# Patient Record
Sex: Female | Born: 1945 | Race: White | Hispanic: No | Smoking: Never smoker
Health system: Southern US, Community
[De-identification: ages and names within clinical notes are randomized; demographics above are authoritative.]

## PROBLEM LIST (undated history)

## (undated) DIAGNOSIS — Z87442 Personal history of urinary calculi: Secondary | ICD-10-CM

## (undated) DIAGNOSIS — K219 Gastro-esophageal reflux disease without esophagitis: Secondary | ICD-10-CM

## (undated) DIAGNOSIS — E7800 Pure hypercholesterolemia, unspecified: Secondary | ICD-10-CM

## (undated) DIAGNOSIS — C801 Malignant (primary) neoplasm, unspecified: Secondary | ICD-10-CM

## (undated) DIAGNOSIS — M1990 Unspecified osteoarthritis, unspecified site: Secondary | ICD-10-CM

## (undated) DIAGNOSIS — Z8489 Family history of other specified conditions: Secondary | ICD-10-CM

## (undated) DIAGNOSIS — E039 Hypothyroidism, unspecified: Secondary | ICD-10-CM

## (undated) DIAGNOSIS — R011 Cardiac murmur, unspecified: Secondary | ICD-10-CM

## (undated) DIAGNOSIS — R000 Tachycardia, unspecified: Secondary | ICD-10-CM

## (undated) DIAGNOSIS — I340 Nonrheumatic mitral (valve) insufficiency: Secondary | ICD-10-CM

## (undated) DIAGNOSIS — I10 Essential (primary) hypertension: Secondary | ICD-10-CM

## (undated) HISTORY — PX: CHOLECYSTECTOMY: SHX55

## (undated) HISTORY — PX: TONSILLECTOMY: SUR1361

## (undated) HISTORY — PX: ABDOMINAL HYSTERECTOMY: SHX81

## (undated) HISTORY — PX: REPLACEMENT TOTAL KNEE BILATERAL: SUR1225

## (undated) HISTORY — PX: TUBAL LIGATION: SHX77

## (undated) HISTORY — PX: BILATERAL SALPINGOOPHORECTOMY: SHX1223

---

## 1993-12-12 HISTORY — PX: BREAST EXCISIONAL BIOPSY: SUR124

## 1999-04-26 ENCOUNTER — Encounter: Payer: Self-pay | Admitting: Orthopedic Surgery

## 1999-04-30 ENCOUNTER — Encounter: Payer: Self-pay | Admitting: Orthopedic Surgery

## 1999-04-30 ENCOUNTER — Inpatient Hospital Stay (HOSPITAL_COMMUNITY): Admission: RE | Admit: 1999-04-30 | Discharge: 1999-05-04 | Payer: Self-pay | Admitting: Orthopedic Surgery

## 1999-05-04 ENCOUNTER — Inpatient Hospital Stay (HOSPITAL_COMMUNITY)
Admission: RE | Admit: 1999-05-04 | Discharge: 1999-05-13 | Payer: Self-pay | Admitting: Physical Medicine & Rehabilitation

## 1999-07-27 ENCOUNTER — Inpatient Hospital Stay (HOSPITAL_COMMUNITY): Admission: RE | Admit: 1999-07-27 | Discharge: 1999-08-01 | Payer: Self-pay | Admitting: Orthopaedic Surgery

## 1999-10-18 ENCOUNTER — Encounter: Payer: Self-pay | Admitting: Family Medicine

## 1999-10-18 ENCOUNTER — Encounter: Admission: RE | Admit: 1999-10-18 | Discharge: 1999-10-18 | Payer: Self-pay | Admitting: Family Medicine

## 2000-10-19 ENCOUNTER — Encounter: Payer: Self-pay | Admitting: Family Medicine

## 2000-10-19 ENCOUNTER — Encounter: Admission: RE | Admit: 2000-10-19 | Discharge: 2000-10-19 | Payer: Self-pay | Admitting: *Deleted

## 2001-10-22 ENCOUNTER — Encounter: Payer: Self-pay | Admitting: Family Medicine

## 2001-10-22 ENCOUNTER — Encounter: Admission: RE | Admit: 2001-10-22 | Discharge: 2001-10-22 | Payer: Self-pay | Admitting: Family Medicine

## 2002-10-28 ENCOUNTER — Encounter: Payer: Self-pay | Admitting: Family Medicine

## 2002-10-28 ENCOUNTER — Encounter: Admission: RE | Admit: 2002-10-28 | Discharge: 2002-10-28 | Payer: Self-pay | Admitting: Family Medicine

## 2002-12-27 ENCOUNTER — Ambulatory Visit (HOSPITAL_COMMUNITY): Admission: RE | Admit: 2002-12-27 | Discharge: 2002-12-27 | Payer: Self-pay | Admitting: Family Medicine

## 2004-01-12 ENCOUNTER — Encounter: Admission: RE | Admit: 2004-01-12 | Discharge: 2004-01-12 | Payer: Self-pay | Admitting: Endocrinology

## 2004-02-10 ENCOUNTER — Other Ambulatory Visit: Admission: RE | Admit: 2004-02-10 | Discharge: 2004-02-10 | Payer: Self-pay | Admitting: Endocrinology

## 2004-04-06 ENCOUNTER — Encounter: Admission: RE | Admit: 2004-04-06 | Discharge: 2004-04-06 | Payer: Self-pay | Admitting: Family Medicine

## 2005-04-07 ENCOUNTER — Ambulatory Visit (HOSPITAL_COMMUNITY): Admission: RE | Admit: 2005-04-07 | Discharge: 2005-04-07 | Payer: Self-pay | Admitting: Family Medicine

## 2005-04-27 ENCOUNTER — Encounter: Admission: RE | Admit: 2005-04-27 | Discharge: 2005-04-27 | Payer: Self-pay | Admitting: Family Medicine

## 2005-12-12 HISTORY — PX: ROUX-EN-Y PROCEDURE: SUR1287

## 2006-08-15 ENCOUNTER — Ambulatory Visit (HOSPITAL_COMMUNITY): Admission: RE | Admit: 2006-08-15 | Discharge: 2006-08-15 | Payer: Self-pay

## 2006-10-04 ENCOUNTER — Encounter (INDEPENDENT_AMBULATORY_CARE_PROVIDER_SITE_OTHER): Payer: Self-pay | Admitting: *Deleted

## 2006-10-04 ENCOUNTER — Ambulatory Visit: Payer: Self-pay | Admitting: Internal Medicine

## 2006-10-04 ENCOUNTER — Ambulatory Visit (HOSPITAL_COMMUNITY): Admission: RE | Admit: 2006-10-04 | Discharge: 2006-10-04 | Payer: Self-pay | Admitting: Internal Medicine

## 2006-10-31 ENCOUNTER — Encounter: Admission: RE | Admit: 2006-10-31 | Discharge: 2006-10-31 | Payer: Self-pay | Admitting: Family Medicine

## 2008-01-14 ENCOUNTER — Encounter: Admission: RE | Admit: 2008-01-14 | Discharge: 2008-01-14 | Payer: Self-pay | Admitting: Family Medicine

## 2008-02-15 ENCOUNTER — Ambulatory Visit (HOSPITAL_COMMUNITY): Admission: RE | Admit: 2008-02-15 | Discharge: 2008-02-15 | Payer: Self-pay | Admitting: Family Medicine

## 2009-03-11 ENCOUNTER — Encounter: Admission: RE | Admit: 2009-03-11 | Discharge: 2009-03-11 | Payer: Self-pay | Admitting: Family Medicine

## 2009-03-16 ENCOUNTER — Encounter: Admission: RE | Admit: 2009-03-16 | Discharge: 2009-03-16 | Payer: Self-pay | Admitting: Family Medicine

## 2010-04-06 ENCOUNTER — Emergency Department (HOSPITAL_COMMUNITY): Admission: EM | Admit: 2010-04-06 | Discharge: 2010-04-07 | Payer: Self-pay | Admitting: Emergency Medicine

## 2010-04-08 ENCOUNTER — Ambulatory Visit (HOSPITAL_COMMUNITY): Admission: RE | Admit: 2010-04-08 | Discharge: 2010-04-08 | Payer: Self-pay | Admitting: Urology

## 2010-08-12 ENCOUNTER — Encounter: Admission: RE | Admit: 2010-08-12 | Discharge: 2010-08-12 | Payer: Self-pay | Admitting: Family Medicine

## 2010-11-02 ENCOUNTER — Encounter: Admission: RE | Admit: 2010-11-02 | Discharge: 2010-11-02 | Payer: Self-pay | Admitting: Surgery

## 2010-12-12 HISTORY — PX: TOTAL THYROIDECTOMY: SHX2547

## 2010-12-27 LAB — URINALYSIS, ROUTINE W REFLEX MICROSCOPIC
Bilirubin Urine: NEGATIVE
Hgb urine dipstick: NEGATIVE
Ketones, ur: NEGATIVE mg/dL
Nitrite: NEGATIVE
Protein, ur: NEGATIVE mg/dL
Specific Gravity, Urine: 1.018 (ref 1.005–1.030)
Urine Glucose, Fasting: NEGATIVE mg/dL
Urobilinogen, UA: 0.2 mg/dL (ref 0.0–1.0)
pH: 5.5 (ref 5.0–8.0)

## 2010-12-27 LAB — BASIC METABOLIC PANEL
BUN: 12 mg/dL (ref 6–23)
CO2: 28 mEq/L (ref 19–32)
Calcium: 8.8 mg/dL (ref 8.4–10.5)
Chloride: 108 mEq/L (ref 96–112)
Creatinine, Ser: 0.73 mg/dL (ref 0.4–1.2)
GFR calc Af Amer: >60 mL/min (ref >60)
GFR calc non Af Amer: >60 mL/min (ref >60)
Glucose, Bld: 84 mg/dL (ref 70–99)
Potassium: 3.9 mEq/L (ref 3.5–5.1)
Sodium: 143 mEq/L (ref 135–145)

## 2010-12-27 LAB — SURGICAL PCR SCREEN
MRSA, PCR: NEGATIVE
Staphylococcus aureus: POSITIVE — AB

## 2010-12-27 LAB — CBC
HCT: 38 % (ref 36.0–46.0)
Hemoglobin: 12.4 g/dL (ref 12.0–15.0)
MCH: 29.3 pg (ref 26.0–34.0)
MCHC: 32.6 g/dL (ref 30.0–36.0)
MCV: 89.8 fL (ref 78.0–100.0)
Platelets: 228 10*3/uL (ref 150–400)
RBC: 4.23 MIL/uL (ref 3.87–5.11)
RDW: 13.5 % (ref 11.5–15.5)
WBC: 4.9 10*3/uL (ref 4.0–10.5)

## 2010-12-27 LAB — DIFFERENTIAL
Basophils Absolute: 0 10*3/uL (ref 0.0–0.1)
Basophils Relative: 0 % (ref 0–1)
Eosinophils Absolute: 0.1 10*3/uL (ref 0.0–0.7)
Eosinophils Relative: 3 % (ref 0–5)
Lymphocytes Relative: 39 % (ref 12–46)
Lymphs Abs: 1.9 10*3/uL (ref 0.7–4.0)
Monocytes Absolute: 0.3 10*3/uL (ref 0.1–1.0)
Monocytes Relative: 6 % (ref 3–12)
Neutro Abs: 2.5 10*3/uL (ref 1.7–7.7)
Neutrophils Relative %: 52 % (ref 43–77)

## 2010-12-27 LAB — PROTIME-INR
INR: 0.92 (ref 0.00–1.49)
Prothrombin Time: 12.6 seconds (ref 11.6–15.2)

## 2010-12-31 ENCOUNTER — Ambulatory Visit (HOSPITAL_COMMUNITY)
Admission: RE | Admit: 2010-12-31 | Discharge: 2011-01-01 | Payer: Self-pay | Source: Home / Self Care | Attending: Surgery | Admitting: Surgery

## 2010-12-31 ENCOUNTER — Encounter (INDEPENDENT_AMBULATORY_CARE_PROVIDER_SITE_OTHER): Payer: Self-pay | Admitting: Surgery

## 2011-01-02 ENCOUNTER — Encounter: Payer: Self-pay | Admitting: Family Medicine

## 2011-01-02 NOTE — Discharge Summary (Signed)
  NAMEDEL, WISEMAN NO.:  [id number]  MEDICAL RECORD NO.:  [id number]          PATIENT TYPE:  OIB  LOCATION:  1524                         FACILITY:  [facility]  PHYSICIAN:  Velora Heckler, MD      DATE OF BIRTH:  [birth year]  DATE OF ADMISSION:  12/31/2010 DATE OF DISCHARGE:  01/01/2011                              DISCHARGE SUMMARY   REASON FOR ADMISSION:  Thyroid goiter.  BRIEF HISTORY:  [name] is a 65 year old white female from [place], [place].  The patient was evaluated by Dr. Casimiro Needle Altheimer, due to enlarging thyroid goiter.  The patient has had previous treatment with radioactive iodine.  She previously took thyroid hormone supplementation.  The patient now comes to surgery for thyroidectomy for enlarging thyroid goiter with tracheal deviation.  HOSPITAL COURSE:  The patient was admitted on January 20 and taken directly to the operating room.  She underwent total thyroidectomy. Postoperative course was uncomplicated.  Calcium levels remained stable at 8.7.  The patient had good pain control.  She tolerated a diet.  She was prepared for discharge home on the first postoperative day.  DISCHARGE PLANNING:  The patient is discharged home on January 01, 2011 from [facility].  Prescriptions were given for Vicodin to take as needed for pain and Synthroid 100 mcg daily.  The patient will be seen back in my office in 2 to 3 weeks for wound check and a calcium level.  We will arrange for followup with Dr. Casimiro Needle Altheimer for adjustment of her thyroid hormone replacement dosage.  DISCHARGE DIAGNOSES:  Thyroid goiter, final pathologic results pending at the time of discharge.  CONDITION AT DISCHARGE:  Good.     Velora Heckler, MD     TMG/MEDQ  D:  01/01/2011  T:  01/01/2011  Job:  [id number]  cc:   Veverly Fells. Altheimer, M.D. Fax: [phone]  Donna Bernard, M.D. Fax: [phone]  Electronically Signed by Darnell Level MD on 01/02/2011 10:05:16 AM

## 2011-01-02 NOTE — Op Note (Signed)
[name], [name] NO.:  [id number]  MEDICAL RECORD NO.:  [id number]          PATIENT TYPE:  AMB  LOCATION:  DAY                          FACILITY:  [facility]  PHYSICIAN:  Hailey Heckler, MD      DATE OF BIRTH:  22-Apr-1946  DATE OF PROCEDURE:  12/31/2010 DATE OF DISCHARGE:                              OPERATIVE REPORT   PREOPERATIVE DIAGNOSIS:  Thyroid goiter with tracheal deviation.  POSTOPERATIVE DIAGNOSIS:  Thyroid goiter with tracheal deviation.  PROCEDURE:  Total thyroidectomy.  SURGEON:  Hailey Heckler, MD  ANESTHESIA:  General, per [name]. Fortune, MD  ESTIMATED BLOOD LOSS:  150 mL.  PREPARATION:  ChloraPrep.  COMPLICATIONS:  None.  INDICATIONS FOR PROCEDURE:  The patient is a 65 year old white female from [place], [place].  She was referred by Dr. Casimiro Needle [name] for thyroidectomy for management of thyroid goiter.  The patient has had a known goiter for several years.  Ultrasound in 2005 demonstrated bilateral thyroid nodules.  The patient has a distant history of radioactive iodine treatment for hyperthyroidism.  The patient is not currently taking thyroid hormone replacement.  She now comes to surgery for thyroidectomy due to a large asymmetrical thyroid goiter with tracheal deviation.  PROCEDURE IN DETAIL:  The procedure was done in OR #11 at the [facility].  The patient was brought to the operating room, placed in supine position on the operating room table.  Following administration of general anesthesia, the patient was positioned and then prepped and draped in the usual strict aseptic fashion.  After ascertaining that an adequate level of anesthesia had been achieved, a Kocher incision was made on the anterior neck with a #15 blade. Dissection was carried through subcutaneous tissues and platysma. Hemostasis was obtained with electrocautery.  Skin flaps were elevated cephalad and caudad from the  thyroid notch to the sternal notch.  A Mahorner self-retaining retractor was placed for exposure.  Strap muscles were incised in what was presumably the midline.  There was marked distortion of the tissues due to the asymmetric goiter and large size of the right thyroid lobe.  Incision was extended in the midline of the strap muscles cephalad and caudad.  The plane around the right thyroid lobe was then developed with gentle blunt dissection.  Venous tributaries were divided between Ligaclips with the harmonic scalpel. Right lobe was gently mobilized with gentle blunt dissection and delivered anteriorly.  Superior pole vessels were divided between medium Ligaclips with the harmonic scalpel.  The right lobe has a dominant mass occupying essentially the upper 90% of the lobe.  This was firm, large solid in appearance.  The inferior portion of the right lobe is multicystic and somewhat violaceous in color with a large venous collaterals involved.  With some difficulty, the lobe was mobilized away from the structures in the right neck. Vascular tributaries were divided between Ligaclips with the harmonic scalpel.  Dissection was carried down to the presumed area of the recurrent nerve.  The recurrent nerve was not dissected out due to the distortion of the tissues and fear of injury.  However, at no point  did the nerve become visualized or was the nerve transected, at least not under recognition of that structure.  Inferior parathyroid gland was identified and preserved on its vascular pedicle.  Dissection was carried down to the airway and the presumed ligament of Allyson Sabal was transected and the gland was mobilized up and onto the anterior trachea. The thyroid isthmus was essentially nonexistent and was divided easily with the harmonic scalpel.  The entire right thyroid lobe was submitted to pathology as an individual specimen for evaluation.  Dry pack was placed in the right neck.  Next, we  turned our attention to the left thyroid lobe.  Strap muscles were reflected laterally exposing the left lobe.  The left lobe is much smaller but is essentially a small version of the right.  Again, there was a dominant mass occupying the upper portion of the lobe.  The tissue was of the same consistency and appearance as the right side only on a smaller scale.  There was some inflammatory component.  With some difficulty, the surrounding tissues were stripped off the capsule of the thyroid.  Superior pole vessels were divided between Ligaclips with the harmonic scalpel.  Inferior venous tributaries were divided between Ligaclips with the harmonic scalpel.  Gland is rolled anteriorly.  On the left side, the recurrent nerve was positively identified and preserved.  Branches of the inferior thyroid artery are divided between small Ligaclips with the harmonic scalpel.  Ligament of Allyson Sabal was released with the electrocautery and the lobe was mobilized onto the anterior trachea and completely excised.  It was submitted in its entirety as the left thyroid lobe to pathology for review.  Neck was irrigated copiously with warm saline.  Good hemostasis was achieved throughout.  Surgicel was placed in the operative field.  Strap muscles were reapproximated in the midline with interrupted 3-0 Vicryl sutures.  Platysma was closed with interrupted 3-0 Vicryl sutures.  Skin was closed with running 4-0 Monocryl subcuticular suture.  Wound was washed and dried and Benzoin and Steri-Strips were applied.  Sterile dressings were applied.  The patient was awakened from anesthesia and brought to the recovery room.  The patient tolerated the procedure well.     Hailey Heckler, MD     TMG/MEDQ  D:  12/31/2010  T:  12/31/2010  Job:  [id number]  cc:   Hailey Fells. [name], M.D. Fax: [phone]  [name], M.D. Fax: [phone]  Electronically Signed by Darnell Level MD on 01/02/2011 10:05:12 AM

## 2011-01-03 LAB — CALCIUM: Calcium: 8.7 mg/dL (ref 8.4–10.5)

## 2011-01-04 LAB — CALCIUM: Calcium: 8.7 mg/dL (ref 8.4–10.5)

## 2011-01-31 ENCOUNTER — Other Ambulatory Visit (HOSPITAL_COMMUNITY): Payer: Self-pay | Admitting: Endocrinology

## 2011-01-31 DIAGNOSIS — C73 Malignant neoplasm of thyroid gland: Secondary | ICD-10-CM

## 2011-02-09 ENCOUNTER — Ambulatory Visit (HOSPITAL_COMMUNITY)
Admission: RE | Admit: 2011-02-09 | Discharge: 2011-02-09 | Disposition: A | Discharge status: Home / Self Care | Payer: BC Managed Care – PPO | Source: Ambulatory Visit | Attending: Endocrinology | Admitting: Endocrinology

## 2011-02-09 DIAGNOSIS — Z09 Encounter for follow-up examination after completed treatment for conditions other than malignant neoplasm: Secondary | ICD-10-CM | POA: Insufficient documentation

## 2011-02-09 DIAGNOSIS — C73 Malignant neoplasm of thyroid gland: Secondary | ICD-10-CM | POA: Insufficient documentation

## 2011-02-11 MED ORDER — SODIUM IODIDE I 131 CAPSULE
109.0000 | Freq: Once | INTRAVENOUS | Status: AC | PRN
  Administered 2011-02-11: 109 via ORAL

## 2011-02-21 ENCOUNTER — Ambulatory Visit (HOSPITAL_COMMUNITY)
Admission: RE | Admit: 2011-02-21 | Discharge: 2011-02-21 | Disposition: A | Discharge status: Home / Self Care | Payer: Medicare Other | Source: Ambulatory Visit | Attending: Endocrinology | Admitting: Endocrinology

## 2011-02-21 DIAGNOSIS — C73 Malignant neoplasm of thyroid gland: Secondary | ICD-10-CM | POA: Insufficient documentation

## 2011-02-21 DIAGNOSIS — E0789 Other specified disorders of thyroid: Secondary | ICD-10-CM | POA: Insufficient documentation

## 2011-03-01 LAB — DIFFERENTIAL
Basophils Relative: 0 % (ref 0–1)
Eosinophils Absolute: 0 10*3/uL (ref 0.0–0.7)
Eosinophils Relative: 0 % (ref 0–5)
Monocytes Absolute: 0.6 10*3/uL (ref 0.1–1.0)
Monocytes Relative: 6 % (ref 3–12)
Neutrophils Relative %: 78 % — ABNORMAL HIGH (ref 43–77)

## 2011-03-01 LAB — HEPATIC FUNCTION PANEL
Alkaline Phosphatase: 53 U/L (ref 39–117)
Bilirubin, Direct: 0.2 mg/dL (ref 0.0–0.3)
Indirect Bilirubin: 0.6 mg/dL (ref 0.3–0.9)
Total Bilirubin: 0.8 mg/dL (ref 0.3–1.2)

## 2011-03-01 LAB — BASIC METABOLIC PANEL
Chloride: 105 mEq/L (ref 96–112)
Creatinine, Ser: 0.94 mg/dL (ref 0.4–1.2)
GFR calc Af Amer: >60 mL/min (ref >60)
Potassium: 3.4 mEq/L — ABNORMAL LOW (ref 3.5–5.1)

## 2011-03-01 LAB — CBC
MCV: 88.9 fL (ref 78.0–100.0)
RBC: 4.47 MIL/uL (ref 3.87–5.11)
WBC: 9.6 10*3/uL (ref 4.0–10.5)

## 2011-03-01 LAB — LIPASE, BLOOD: Lipase: 18 U/L (ref 11–59)

## 2011-04-29 NOTE — Op Note (Signed)
NAMEABBEY, VEITH             ACCOUNT NO.:  [id number]   MEDICAL RECORD NO.:  [id number]          PATIENT TYPE:  AMB   LOCATION:  DAY                           FACILITY:  [facility]   PHYSICIAN:  Lionel December, M.D.    DATE OF BIRTH:  1945-12-22   DATE OF PROCEDURE:  10/04/2006  DATE OF DISCHARGE:                                 OPERATIVE REPORT   PROCEDURE:  Esophagogastroduodenoscopy.   INDICATION:  [name] is a 65 year old Caucasian female who is being  evaluated for bariatric surgery.  She is scheduled to undergo Roux-en-Y  procedure for obesity next month.  She had preop upper GI series which shows  either thickened fold or polyps.  She is therefore undergoing diagnostic  EGD.  The patient has chronic GERD and his symptoms well-controlled with  PPI.  Her last EGD was in 1999 when she had small sliding hiatal hernia.  Procedure risks were reviewed with the patient and informed consent was  obtained.   MEDICATIONS FOR CONSCIOUS SEDATION:  Benzocaine spray for pharyngeal topical  anesthesia, Demerol 50 mg IV and Versed 10 mg IV.   FINDINGS:  Procedure performed in endoscopy suite.  The patient's vital  signs and O2 sat were monitored during procedure and remained stable.  The  patient was placed left lateral recumbent position and Olympus videoscope  was passed per oropharynx without any difficulty into esophagus.   Esophagus:  Mucosa of the esophagus was normal.  GE junction was at 38 cm  from incisors and hiatus was at 40.  She had small sliding hiatal hernia.   Stomach:  It was empty and distended very well with insufflation.  Folds in  proximal stomach were normal.  Examination of mucosa revealed multiple small  polyps at gastric body.  The largest revealed multiple polyps.  Most of  which were small but three of these was 10-12 mm.  Endoscopically they  appeared to be hyperplastic.  Antral mucosa was normal.  Pyloric channel was  patent.  Angularis, fundus and cardia were  examined by retroflexing the  scope and were normal.   Duodenum:  Bulbar mucosa was normal.  Scope was passed in the second part of  the duodenum where mucosa and folds were normal.   Endoscope was withdrawn.  At least seven of these polyps were biopsied for  histology.  All the samples were submitted in one container.  Endoscope was  withdrawn.  The patient tolerated the procedure well.   FINAL DIAGNOSES:  1. Small sliding hiatal hernia.  No evidence of esophagitis.  2. Multiple gastric polyps at body with appearance typical of hyperplastic      polyps.  Multiple of these were biopsied for histologic confirmation.   No evidence of peptic ulcer disease.   RECOMMENDATIONS:  She will continue her entire reflux measures, PPI as  before.  H. pylori serology will be checked today.  I will be contacting  patient with the results of biopsy and blood test results.   She will keep her appointment with Dr. Oneita Hurt.      Lionel December, M.D.  Electronically Signed  NR/MEDQ  D:  10/04/2006  T:  10/05/2006  Job:  [id number]   cc:   Donna Bernard, M.D.  Fax: [phone]   Oneita Hurt

## 2011-08-24 ENCOUNTER — Other Ambulatory Visit: Payer: Self-pay | Admitting: Family Medicine

## 2011-08-24 DIAGNOSIS — Z1231 Encounter for screening mammogram for malignant neoplasm of breast: Secondary | ICD-10-CM

## 2011-09-02 ENCOUNTER — Ambulatory Visit
Admission: RE | Admit: 2011-09-02 | Discharge: 2011-09-02 | Disposition: A | Discharge status: Home / Self Care | Payer: BC Managed Care – PPO | Source: Ambulatory Visit | Attending: Family Medicine | Admitting: Family Medicine

## 2011-09-02 DIAGNOSIS — Z1231 Encounter for screening mammogram for malignant neoplasm of breast: Secondary | ICD-10-CM

## 2011-10-25 ENCOUNTER — Telehealth: Payer: Self-pay

## 2011-10-25 ENCOUNTER — Other Ambulatory Visit: Payer: Self-pay

## 2011-10-25 DIAGNOSIS — Z139 Encounter for screening, unspecified: Secondary | ICD-10-CM

## 2011-10-25 NOTE — Telephone Encounter (Signed)
OK to proceed with colonoscopy.

## 2011-10-25 NOTE — Telephone Encounter (Signed)
Gastroenterology Pre-Procedure Form   TRIAGED BY Amanda Cockayne  CMA  Request Date: 10/24/2011       Requesting Physician: Dr. Simone Curia     PATIENT INFORMATION:  [name] is a 65 y.o., female (DOB=1946-02-01).  PROCEDURE: Procedure(s) requested: colonoscopy Procedure Reason: screening for colon cancer  PATIENT REVIEW QUESTIONS: The patient reports the following:   1. Diabetes Melitis: no 2. Joint replacements in the past 12 months: no 3. Major health problems in the past 3 months: no 4. Has an artificial valve or MVP:no 5. Has been advised in past to take antibiotics in advance of a procedure like teeth cleaning: no}    MEDICATIONS & ALLERGIES:    Patient reports the following regarding taking any blood thinners:   Plavix? no Aspirin?yes  Coumadin?  no  Patient confirms/reports the following medications:  Current Outpatient Prescriptions  Medication Sig Dispense Refill  . aspirin 81 MG tablet Take 81 mg by mouth daily.        . Multiple Vitamin (MULTIVITAMIN) tablet Take 1 tablet by mouth daily.        . potassium chloride SA (K-DUR,KLOR-CON) 20 MEQ tablet Take 20 mEq by mouth 2 (two) times daily.        . simvastatin (ZOCOR) 20 MG tablet Take 20 mg by mouth at bedtime.          Patient confirms/reports the following allergies:  Allergies  Allergen Reactions  . Codeine Nausea And Vomiting  . Demerol Nausea And Vomiting    Patient is appropriate to schedule for requested procedure(s): yes  AUTHORIZATION INFORMATION Primary Insurance:   ID #:  Group #:  Pre-Cert / Auth required:} Pre-Cert / Auth #:   Secondary Insurance:   ID #:   Group #:  Pre-Cert / Auth required:  Pre-Cert / Auth #:   No orders of the defined types were placed in this encounter.    SCHEDULE INFORMATION: Procedure has been scheduled as follows:  Date: 11/14/2011     Time: 1:15 PM  Location: [facility] Short Stay  This Gastroenterology Pre-Precedure Form is being  routed to the following provider(s) for review: R. Roetta Sessions, MD

## 2011-10-26 NOTE — Telephone Encounter (Signed)
Rx and instructions mailed to pt.  

## 2011-11-08 ENCOUNTER — Encounter (HOSPITAL_COMMUNITY): Payer: Self-pay | Admitting: Pharmacy Technician

## 2011-11-11 MED ORDER — SODIUM CHLORIDE 0.45 % IV SOLN: Freq: Once | INTRAVENOUS | Status: DC

## 2011-11-14 ENCOUNTER — Encounter (HOSPITAL_COMMUNITY)
Admission: RE | Disposition: A | Discharge status: Home / Self Care | Payer: Self-pay | Source: Ambulatory Visit | Attending: Internal Medicine

## 2011-11-14 ENCOUNTER — Ambulatory Visit (HOSPITAL_COMMUNITY)
Admission: RE | Admit: 2011-11-14 | Discharge: 2011-11-14 | Disposition: A | Discharge status: Home / Self Care | Payer: BC Managed Care – PPO | Source: Ambulatory Visit | Attending: Internal Medicine | Admitting: Internal Medicine

## 2011-11-14 ENCOUNTER — Encounter (HOSPITAL_COMMUNITY): Payer: Self-pay | Admitting: *Deleted

## 2011-11-14 DIAGNOSIS — E7800 Pure hypercholesterolemia, unspecified: Secondary | ICD-10-CM | POA: Insufficient documentation

## 2011-11-14 DIAGNOSIS — Z1211 Encounter for screening for malignant neoplasm of colon: Secondary | ICD-10-CM

## 2011-11-14 DIAGNOSIS — K5730 Diverticulosis of large intestine without perforation or abscess without bleeding: Secondary | ICD-10-CM | POA: Insufficient documentation

## 2011-11-14 DIAGNOSIS — Z139 Encounter for screening, unspecified: Secondary | ICD-10-CM

## 2011-11-14 DIAGNOSIS — Z7982 Long term (current) use of aspirin: Secondary | ICD-10-CM | POA: Insufficient documentation

## 2011-11-14 HISTORY — DX: Pure hypercholesterolemia, unspecified: E78.00

## 2011-11-14 HISTORY — PX: COLONOSCOPY: SHX5424

## 2011-11-14 HISTORY — DX: Hypothyroidism, unspecified: E03.9

## 2011-11-14 HISTORY — DX: Malignant (primary) neoplasm, unspecified: C80.1

## 2011-11-14 SURGERY — COLONOSCOPY
Anesthesia: Moderate Sedation

## 2011-11-14 MED ORDER — MIDAZOLAM HCL 5 MG/5ML IJ SOLN
INTRAMUSCULAR | Status: DC | PRN
  Administered 2011-11-14: 2 mg via INTRAVENOUS
  Administered 2011-11-14: 1 mg via INTRAVENOUS
  Administered 2011-11-14: 2 mg via INTRAVENOUS

## 2011-11-14 MED ORDER — STERILE WATER FOR IRRIGATION IR SOLN
Status: DC | PRN
  Administered 2011-11-14: 13:00:00

## 2011-11-14 MED ORDER — FENTANYL CITRATE 0.05 MG/ML IJ SOLN
INTRAMUSCULAR | Status: AC
  Filled 2011-11-14: qty 4

## 2011-11-14 MED ORDER — FENTANYL CITRATE 0.05 MG/ML IJ SOLN
INTRAMUSCULAR | Status: DC | PRN
  Administered 2011-11-14: 50 ug via INTRAVENOUS
  Administered 2011-11-14: 25 ug via INTRAVENOUS

## 2011-11-14 MED ORDER — MEPERIDINE HCL 100 MG/ML IJ SOLN: INTRAMUSCULAR | Status: DC | PRN

## 2011-11-14 MED ORDER — MIDAZOLAM HCL 5 MG/5ML IJ SOLN
INTRAMUSCULAR | Status: AC
  Filled 2011-11-14: qty 10

## 2011-11-14 NOTE — H&P (Signed)
Primary Care Physician:  Harlow Asa, MD, MD Primary Gastroenterologist:  Dr.   Pre-Procedure History & Physical: HPI:  [name] is a 65 y.o. female is here for a screening colonoscopy. May never have had a prior colonoscopy. Did have a sigmoidoscopy previously. No GI symptoms currently. No family history of polyps or colon cancer  Past Medical History  Diagnosis Date  . Hypercholesteremia   . Hypothyroidism   . Cancer     Thyroid cancer    Past Surgical History  Procedure Date  . Total thyroidectomy 12/2010  . Abdominal hysterectomy   . Roux-en-y procedure 2007  . Cholecystectomy   . Bilateral salpingoophorectomy   . Tubal ligation   . Tonsillectomy   . Replacement total knee bilateral     Prior to Admission medications   Medication Sig Start Date End Date Taking? Authorizing Provider  aspirin EC 81 MG tablet Take 81 mg by mouth every evening.      Historical Provider, MD  calcium citrate (CALCITRATE - DOSED IN MG ELEMENTAL CALCIUM) 950 MG tablet Take 2 tablets by mouth 2 (two) times daily.      Historical Provider, MD  Cholecalciferol (VITAMIN D-3) 5000 UNITS TABS Take 1 tablet by mouth every morning.     Historical Provider, MD  levothyroxine (SYNTHROID, LEVOTHROID) 175 MCG tablet Take 175 mcg by mouth every morning.      Historical Provider, MD  Multiple Vitamins-Minerals (MULTIVITAMINS THER. W/MINERALS) TABS Take 1 tablet by mouth 2 (two) times daily.      Historical Provider, MD  potassium chloride SA (K-DUR,KLOR-CON) 20 MEQ tablet Take 20 mEq by mouth 2 (two) times daily.      Historical Provider, MD  simvastatin (ZOCOR) 20 MG tablet Take 20 mg by mouth at bedtime.     Historical Provider, MD  vitamin B-12 (CYANOCOBALAMIN) 500 MCG tablet Take 500 mcg by mouth every morning.      Historical Provider, MD    Allergies as of 10/25/2011 - Review Complete 09/02/2011  Allergen Reaction Noted  . Codeine Nausea And Vomiting 10/25/2011  . Demerol Nausea And Vomiting  10/25/2011    Family History  Problem Relation Age of Onset  . Colon cancer Neg Hx     History   Social History  . Marital Status: Widowed    Spouse Name: N/A    Number of Children: N/A  . Years of Education: N/A   Occupational History  . Not on file.   Social History Main Topics  . Smoking status: Never Smoker   . Smokeless tobacco: Not on file  . Alcohol Use: No  . Drug Use: No  . Sexually Active:    Other Topics Concern  . Not on file   Social History Narrative  . No narrative on file    Review of Systems: See HPI, otherwise negative ROS  Physical Exam: BP 156/80  Pulse 69  Temp(Src) 98.2 F (36.8 C) (Oral)  Resp 14  Ht 5' 7.5" (1.715 m)  Wt 187 lb (84.823 kg)  BMI 28.86 kg/m2  SpO2 97% General:   Alert,  Well-developed, well-nourished, pleasant and cooperative in NAD Head:  Normocephalic and atraumatic. Eyes:  Sclera clear, no icterus.   Conjunctiva pink. Lungs:  Clear throughout to auscultation.   No wheezes, crackles, or rhonchi. No acute distress. Heart:  Regular rate and rhythm; no murmurs, clicks, rubs,  or gallops. Abdomen:  Nondistended positive bowel sounds soft and nontender without appreciable mass or organomegaly  Pulses:  Normal pulses noted. Extremities:  Without clubbing or edema. Neurologic:  Alert and  oriented x4;  grossly normal neurologically. Skin:  Intact without significant lesions or rashes. Cervical Nodes:  No significant cervical adenopathy. Psych:  Alert and cooperative. Normal mood and affect.  Impression/Plan: [name] is now here to undergo a screening colonoscopy. Average risk screening exam. No GI symptoms and no family history.  The risks, benefits, limitations, imponderables and alternatives regarding colonoscopy have been reviewed with the patient. Questions have been answered. All parties agreeable.

## 2011-11-23 ENCOUNTER — Encounter (HOSPITAL_COMMUNITY): Payer: Self-pay | Admitting: Internal Medicine

## 2012-08-21 ENCOUNTER — Other Ambulatory Visit: Payer: Self-pay | Admitting: Family Medicine

## 2012-08-21 DIAGNOSIS — Z1231 Encounter for screening mammogram for malignant neoplasm of breast: Secondary | ICD-10-CM

## 2012-09-04 ENCOUNTER — Ambulatory Visit: Payer: BC Managed Care – PPO

## 2012-10-23 ENCOUNTER — Ambulatory Visit
Admission: RE | Admit: 2012-10-23 | Discharge: 2012-10-23 | Disposition: A | Discharge status: Home / Self Care | Payer: Medicare Other | Source: Ambulatory Visit | Attending: Family Medicine | Admitting: Family Medicine

## 2012-10-23 DIAGNOSIS — Z1231 Encounter for screening mammogram for malignant neoplasm of breast: Secondary | ICD-10-CM

## 2013-09-06 ENCOUNTER — Encounter: Payer: Self-pay | Admitting: Family Medicine

## 2013-09-06 ENCOUNTER — Ambulatory Visit (INDEPENDENT_AMBULATORY_CARE_PROVIDER_SITE_OTHER): Payer: Medicare Other | Admitting: Family Medicine

## 2013-09-06 VITALS — BP 164/100 | Ht 67.0 in | Wt 180.2 lb

## 2013-09-06 DIAGNOSIS — E785 Hyperlipidemia, unspecified: Secondary | ICD-10-CM

## 2013-09-06 DIAGNOSIS — K219 Gastro-esophageal reflux disease without esophagitis: Secondary | ICD-10-CM

## 2013-09-06 DIAGNOSIS — I10 Essential (primary) hypertension: Secondary | ICD-10-CM

## 2013-09-06 DIAGNOSIS — E7800 Pure hypercholesterolemia, unspecified: Secondary | ICD-10-CM

## 2013-09-06 DIAGNOSIS — Z79899 Other long term (current) drug therapy: Secondary | ICD-10-CM

## 2013-09-06 MED ORDER — ENALAPRIL MALEATE 5 MG PO TABS: 5.0000 mg | ORAL_TABLET | Freq: Every day | ORAL | Status: DC

## 2013-09-06 NOTE — Progress Notes (Signed)
  Subjective:    Patient ID: [name], female    DOB: June 27, 1946, 67 y.o.   MRN: [id number]  HPI Patient is here today b/c she has BP concerns. She states they have been in the 160's over 90's. She noticed the high BP's 2 weeks ago when [employer] took her BP. Pt does have headaches in the morning, but they resolve quickly when she takes Tylenol. Pt denies blurry vision.   Pt also has a rash on her left arm. She believes it is poison oak b/c she was cutting it back 2 weeks ago.   Patient also needs screening for of numerous concerns. Due to gastric bypass has been told she needs annual assessment of iron and calcium metabolism.  Patient states mostly watch his diet trying to do the right thing.   elevated lately.  Headache in am, not partic high. Diffuse in nature.    Review of Systems No chest pain no back pain no abdominal pain ROS otherwise negative    Objective:   Physical Exam  Alert no acute distress. HEENT normal. Lungs clear. Heart regular in rhythm. Abdomen benign. Ankles without edema.  Blood pressure repeat 144/96      Assessment & Plan:  Impression hypertension discussed time to resume medicine. #2 hyperlipidemia and discuss. #3 hypothyroidism discussed fall by specialist. #4 status post gastric bypass. Plan appropriate blood work. Initiate enalapril 5 daily recheck in several months. WSL

## 2013-09-09 DIAGNOSIS — I10 Essential (primary) hypertension: Secondary | ICD-10-CM | POA: Insufficient documentation

## 2013-09-09 DIAGNOSIS — E785 Hyperlipidemia, unspecified: Secondary | ICD-10-CM | POA: Insufficient documentation

## 2013-09-09 DIAGNOSIS — K219 Gastro-esophageal reflux disease without esophagitis: Secondary | ICD-10-CM | POA: Insufficient documentation

## 2013-09-11 LAB — VITAMIN D 25 HYDROXY (VIT D DEFICIENCY, FRACTURES): Vit D, 25-Hydroxy: 46 ng/mL (ref 30–89)

## 2013-09-11 LAB — LIPID PANEL
Cholesterol: 180 mg/dL (ref 0–200)
HDL: 63 mg/dL (ref >39)
Total CHOL/HDL Ratio: 2.9 Ratio

## 2013-09-11 LAB — HEPATIC FUNCTION PANEL
ALT: 17 U/L (ref 0–35)
AST: 15 U/L (ref 0–37)
Alkaline Phosphatase: 58 U/L (ref 39–117)
Bilirubin, Direct: 0.1 mg/dL (ref 0.0–0.3)

## 2013-09-11 LAB — BASIC METABOLIC PANEL
CO2: 29 mEq/L (ref 19–32)
Calcium: 8.5 mg/dL (ref 8.4–10.5)
Creat: 0.73 mg/dL (ref 0.50–1.10)
Glucose, Bld: 86 mg/dL (ref 70–99)
Sodium: 142 mEq/L (ref 135–145)

## 2013-09-11 LAB — FOLATE: Folate: >20 ng/mL

## 2013-10-17 ENCOUNTER — Other Ambulatory Visit: Payer: Self-pay

## 2013-12-09 ENCOUNTER — Encounter: Payer: Self-pay | Admitting: Family Medicine

## 2013-12-09 ENCOUNTER — Ambulatory Visit (INDEPENDENT_AMBULATORY_CARE_PROVIDER_SITE_OTHER): Payer: Medicare Other | Admitting: Family Medicine

## 2013-12-09 VITALS — BP 148/90 | Ht 67.0 in | Wt 179.0 lb

## 2013-12-09 DIAGNOSIS — I10 Essential (primary) hypertension: Secondary | ICD-10-CM

## 2013-12-09 MED ORDER — ENALAPRIL MALEATE 5 MG PO TABS: 5.0000 mg | ORAL_TABLET | Freq: Every day | ORAL | Status: DC

## 2013-12-09 NOTE — Progress Notes (Signed)
   Subjective:    Patient ID: [name], female    DOB: 1946/02/26, 67 y.o.   MRN: [id number]  HPI Patient is here today for a med check.   She was started on Enalapril on 9/26.   Her BP's range from 115-153 / 80-100. Taking bp med faithfully. No obvious side effects to the medicine.    Results for orders placed in visit on 09/06/13  LIPID PANEL      Result Value Range   Cholesterol 180  0 - 200 mg/dL   Triglycerides 74  <782 mg/dL   HDL 63  >95 mg/dL   Total CHOL/HDL Ratio 2.9     VLDL 15  0 - 40 mg/dL   LDL Cholesterol 621 (*) 0 - 99 mg/dL  HEPATIC FUNCTION PANEL      Result Value Range   Total Bilirubin 0.5  0.3 - 1.2 mg/dL   Bilirubin, Direct 0.1  0.0 - 0.3 mg/dL   Indirect Bilirubin 0.4  0.0 - 0.9 mg/dL   Alkaline Phosphatase 58  39 - 117 U/L   AST 15  0 - 37 U/L   ALT 17  0 - 35 U/L   Total Protein 5.8 (*) 6.0 - 8.3 g/dL   Albumin 3.5  3.5 - 5.2 g/dL  BASIC METABOLIC PANEL      Result Value Range   Sodium 142  135 - 145 mEq/L   Potassium 4.5  3.5 - 5.3 mEq/L   Chloride 109  96 - 112 mEq/L   CO2 29  19 - 32 mEq/L   Glucose, Bld 86  70 - 99 mg/dL   BUN 15  6 - 23 mg/dL   Creat 3.08  6.57 - 8.46 mg/dL   Calcium 8.5  8.4 - 96.2 mg/dL  VITAMIN D 25 HYDROXY      Result Value Range   Vit D, 25-Hydroxy 46  30 - 89 ng/mL  FERRITIN      Result Value Range   Ferritin 14  10 - 291 ng/mL  VITAMIN B12      Result Value Range   Vitamin B-12 780  211 - 911 pg/mL  FOLATE      Result Value Range   Folate >20.0       Review of Systems No headache no cough no chest pain no shortness breath ROS otherwise negative    Objective:   Physical Exam Alert no acute distress. Lungs clear. Heart regular in rhythm. H&T normal. Blood pressure good on repeat 138/84       Assessment & Plan:  Impression 1 hypertension controlled improved plan maintain same dose. Diet exercise discussed. Followup as scheduled. WSL

## 2014-01-20 ENCOUNTER — Other Ambulatory Visit: Payer: Self-pay | Admitting: Family Medicine

## 2014-03-27 ENCOUNTER — Ambulatory Visit (INDEPENDENT_AMBULATORY_CARE_PROVIDER_SITE_OTHER): Payer: Medicare Other | Admitting: Family Medicine

## 2014-03-27 ENCOUNTER — Encounter: Payer: Self-pay | Admitting: Family Medicine

## 2014-03-27 VITALS — BP 140/90 | Ht 67.0 in | Wt 184.5 lb

## 2014-03-27 DIAGNOSIS — I10 Essential (primary) hypertension: Secondary | ICD-10-CM

## 2014-03-27 DIAGNOSIS — K219 Gastro-esophageal reflux disease without esophagitis: Secondary | ICD-10-CM

## 2014-03-27 DIAGNOSIS — E785 Hyperlipidemia, unspecified: Secondary | ICD-10-CM

## 2014-03-27 NOTE — Progress Notes (Signed)
   Subjective:    Patient ID: [name], female    DOB: 1946/01/22, 68 y.o.   MRN: [id number]  Leg Pain  The incident occurred more than 1 week ago. There was no injury mechanism. The pain is present in the right leg. The quality of the pain is described as shooting. The pain is at a severity of 7/10. The pain is moderate. The pain has been intermittent since onset. She reports no foreign bodies present. Nothing aggravates the symptoms. She has tried nothing for the symptoms. The treatment provided no relief.    Patient states she has a spot on her lip that she wants the doctor to take a look at.   Jerking and shooting pain severe genrally at night time  No day symptoms  If moved a certain way gets pain, severe, and worse at night.  Further history the pain is sharp worse with certain motions. More near the knee. Recalls no sudden injury.  Review of Systems No chest pain no back pain no abdominal pain no change in bowel habits no blood in stool ROS otherwise negative    Objective:   Physical Exam  Alert no apparent distress vitals stable. Lungs clear. Heart regular rate and rhythm. Knee no obvious joint laxity. Some pain with flexion. Some lateral knee pain to palpation. Lip nonspecific irritation      Assessment & Plan:  Impression 1 hypertension good control. #2 reflux stable. #3 right knee pain possibly related to old joint replacement. #4 nonspecific irritation on left. Plan diet exercise discussed. Maintain same medications. Potential for referral discussed. WSL

## 2014-04-04 ENCOUNTER — Telehealth: Payer: Self-pay | Admitting: Family Medicine

## 2014-04-04 NOTE — Telephone Encounter (Signed)
[facility] - need to know if we were supposed to refer somewhere, possibly for knee, Dr. Richardson Landry not sure

## 2014-04-11 NOTE — Telephone Encounter (Signed)
ok 

## 2014-04-11 NOTE — Telephone Encounter (Signed)
Spoke with pt, she states that since seeing Dr. Richardson Landry her knee is better, would like to hold off on referral at this time, will let us know in the future if and when she wants to be referred

## 2014-04-23 ENCOUNTER — Ambulatory Visit (INDEPENDENT_AMBULATORY_CARE_PROVIDER_SITE_OTHER): Payer: Medicare Other | Admitting: Family Medicine

## 2014-04-23 ENCOUNTER — Encounter: Payer: Self-pay | Admitting: Family Medicine

## 2014-04-23 VITALS — BP 136/88 | Temp 98.6°F | Ht 67.0 in | Wt 185.0 lb

## 2014-04-23 DIAGNOSIS — J310 Chronic rhinitis: Secondary | ICD-10-CM

## 2014-04-23 DIAGNOSIS — J329 Chronic sinusitis, unspecified: Secondary | ICD-10-CM

## 2014-04-23 MED ORDER — VALACYCLOVIR HCL 1 G PO TABS: ORAL_TABLET | ORAL | Status: DC

## 2014-04-23 MED ORDER — AZITHROMYCIN 250 MG PO TABS: ORAL_TABLET | ORAL | Status: DC

## 2014-04-23 NOTE — Progress Notes (Signed)
   Subjective:    Patient ID: [name], female    DOB: [birth year], 68 y.o.   MRN: [id number]  Cough This is a new problem. The current episode started in the past 7 days. Associated symptoms comments: Congestion, runny nose. Treatments tried: clairtin, alka seltza, cold and sinus. The treatment provided mild relief.   Started sun night, scratchy throat  Bad cong throat burning Stuffy, diminished enmergy  No chest cong, no fever   cough not real bad, aggravating, off and on  No know n exposure   Review of Systems  Respiratory: Positive for cough.    No vomiting or diarrhea good appetite    Objective:   Physical Exam Alert moderate malaise. Good hydration. Vitals reviewed. HET moderate nasal congestion drainage. Pharynx erythema neck supple. Lungs clear. Heart regular in rhythm.       Assessment & Plan:  Impression post viral rhinosinusitis plan antibiotics prescribed. Symptomatic care discussed. Dimetapp when necessary. WSL

## 2014-07-21 ENCOUNTER — Other Ambulatory Visit: Payer: Self-pay | Admitting: *Deleted

## 2014-07-21 MED ORDER — ENALAPRIL MALEATE 5 MG PO TABS: 5.0000 mg | ORAL_TABLET | Freq: Every day | ORAL | Status: DC

## 2014-07-23 ENCOUNTER — Other Ambulatory Visit: Payer: Self-pay | Admitting: *Deleted

## 2014-07-23 MED ORDER — POTASSIUM CHLORIDE CRYS ER 20 MEQ PO TBCR: EXTENDED_RELEASE_TABLET | ORAL | Status: DC

## 2014-09-22 ENCOUNTER — Ambulatory Visit (INDEPENDENT_AMBULATORY_CARE_PROVIDER_SITE_OTHER): Payer: Medicare Other | Admitting: Family Medicine

## 2014-09-22 ENCOUNTER — Encounter: Payer: Self-pay | Admitting: Family Medicine

## 2014-09-22 VITALS — BP 136/82 | Ht 67.0 in | Wt 183.2 lb

## 2014-09-22 DIAGNOSIS — K219 Gastro-esophageal reflux disease without esophagitis: Secondary | ICD-10-CM

## 2014-09-22 DIAGNOSIS — I10 Essential (primary) hypertension: Secondary | ICD-10-CM

## 2014-09-22 DIAGNOSIS — Z23 Encounter for immunization: Secondary | ICD-10-CM

## 2014-09-22 DIAGNOSIS — M25561 Pain in right knee: Secondary | ICD-10-CM

## 2014-09-22 NOTE — Progress Notes (Signed)
   Subjective:    Patient ID: [name], female    DOB: Nov 19, 1946, 68 y.o.   MRN: [id number]  Hypertension This is a chronic problem. The current episode started more than 1 year ago. Risk factors for coronary artery disease include dyslipidemia and post-menopausal state. Treatments tried: vasotec. There are no compliance problems.    bp generally good when checked elsewhere.  Right kat knee pain, progressive in nature, history of right knee replacement. Now having progressive pain. Would like to see an orthopedic doctor.   Diet overal good, likes salty foods     Review of Systems No headache no chest pain no back pain no abdominal pain no change about habits no blood in stool    Objective:   Physical Exam   Alert no apparent distress. HEENT normal. Lungs clear. Heart regular rate and rhythm. Neuro exam intact. Sensation ankles present. Right knee some pain with extension    Assessment & Plan:  Impression 1 hypertension good control #2 progressive knee pain post replacement discussed #3 hypothyroidism stable discussed plan flu vaccine. Diet exercise discussed. Orthopedic referral. Check every 6 months. WSL

## 2014-10-15 ENCOUNTER — Other Ambulatory Visit: Payer: Self-pay | Admitting: Family Medicine

## 2015-03-02 DIAGNOSIS — E039 Hypothyroidism, unspecified: Secondary | ICD-10-CM | POA: Diagnosis not present

## 2015-03-02 DIAGNOSIS — E890 Postprocedural hypothyroidism: Secondary | ICD-10-CM | POA: Diagnosis not present

## 2015-03-02 DIAGNOSIS — Z85850 Personal history of malignant neoplasm of thyroid: Secondary | ICD-10-CM | POA: Diagnosis not present

## 2015-03-02 DIAGNOSIS — C73 Malignant neoplasm of thyroid gland: Secondary | ICD-10-CM | POA: Diagnosis not present

## 2015-03-26 ENCOUNTER — Other Ambulatory Visit: Payer: Self-pay | Admitting: Endocrinology

## 2015-03-26 DIAGNOSIS — Z85850 Personal history of malignant neoplasm of thyroid: Secondary | ICD-10-CM

## 2015-03-30 ENCOUNTER — Encounter: Payer: Self-pay | Admitting: Family Medicine

## 2015-03-30 ENCOUNTER — Ambulatory Visit (INDEPENDENT_AMBULATORY_CARE_PROVIDER_SITE_OTHER): Payer: Medicare Other | Admitting: Family Medicine

## 2015-03-30 VITALS — BP 128/78 | Ht 67.0 in | Wt 177.8 lb

## 2015-03-30 DIAGNOSIS — R5383 Other fatigue: Secondary | ICD-10-CM | POA: Diagnosis not present

## 2015-03-30 DIAGNOSIS — E538 Deficiency of other specified B group vitamins: Secondary | ICD-10-CM

## 2015-03-30 DIAGNOSIS — I10 Essential (primary) hypertension: Secondary | ICD-10-CM | POA: Diagnosis not present

## 2015-03-30 DIAGNOSIS — Z79899 Other long term (current) drug therapy: Secondary | ICD-10-CM | POA: Diagnosis not present

## 2015-03-30 DIAGNOSIS — D649 Anemia, unspecified: Secondary | ICD-10-CM

## 2015-03-30 NOTE — Progress Notes (Signed)
   Subjective:    Patient ID: [name], female    DOB: Feb 11, 1946, 69 y.o.   MRN: [id number]  Hypertension This is a chronic problem. The current episode started more than 1 year ago. Risk factors for coronary artery disease include post-menopausal state. Treatments tried: vasotec.    Some exercise off an on with walking and staying busy and working    Patient has history of glucose intolerance in the past. Trying to watch sugar in her diet.  History of hypertension. Compliant with medication.  History of gastric bypass. Has not had blood work and some time was strongly advised that she needed to have it by her prior specialist. Expect graft occasional joint pain and occasional low back pain.   Review of Systems no headache no chest pain no back pain no abdominal pain no change in bowel habits    Objective:   Physical Exam  alert vital stable blood pressure good HEENT normal lungs clear heart rare rhythm ankles without edema        Assessment & Plan:   pressure 1 hypertension discussed good control #2 impaired fasting glucose status uncertain. #3 history of hyperlipidemia status uncertain plan appropriate blood work. Diet exercise discussed. 25 minutes spent most in discussion. Medications refilled. Recheck in 6 months. WSL

## 2015-04-01 DIAGNOSIS — I10 Essential (primary) hypertension: Secondary | ICD-10-CM | POA: Diagnosis not present

## 2015-04-01 DIAGNOSIS — E538 Deficiency of other specified B group vitamins: Secondary | ICD-10-CM | POA: Diagnosis not present

## 2015-04-01 DIAGNOSIS — D649 Anemia, unspecified: Secondary | ICD-10-CM | POA: Diagnosis not present

## 2015-04-01 DIAGNOSIS — R5383 Other fatigue: Secondary | ICD-10-CM | POA: Diagnosis not present

## 2015-04-01 DIAGNOSIS — Z79899 Other long term (current) drug therapy: Secondary | ICD-10-CM | POA: Diagnosis not present

## 2015-04-02 LAB — CBC WITH DIFFERENTIAL/PLATELET
BASOS: 1 %
Basophils Absolute: 0 10*3/uL (ref 0.0–0.2)
EOS ABS: 0.2 10*3/uL (ref 0.0–0.4)
EOS: 4 %
HEMATOCRIT: 33.1 % — AB (ref 34.0–46.6)
Hemoglobin: 10.9 g/dL — ABNORMAL LOW (ref 11.1–15.9)
IMMATURE GRANS (ABS): 0 10*3/uL (ref 0.0–0.1)
IMMATURE GRANULOCYTES: 0 %
LYMPHS ABS: 2.2 10*3/uL (ref 0.7–3.1)
Lymphs: 41 %
MCH: 27 pg (ref 26.6–33.0)
MCHC: 32.9 g/dL (ref 31.5–35.7)
MCV: 82 fL (ref 79–97)
MONOS ABS: 0.4 10*3/uL (ref 0.1–0.9)
Monocytes: 7 %
Neutrophils Absolute: 2.5 10*3/uL (ref 1.4–7.0)
Neutrophils Relative %: 47 %
PLATELETS: 277 10*3/uL (ref 150–379)
RBC: 4.03 x10E6/uL (ref 3.77–5.28)
RDW: 14 % (ref 12.3–15.4)
WBC: 5.3 10*3/uL (ref 3.4–10.8)

## 2015-04-02 LAB — HEPATIC FUNCTION PANEL
ALK PHOS: 70 IU/L (ref 39–117)
ALT: 11 IU/L (ref 0–32)
AST: 15 IU/L (ref 0–40)
Albumin: 3.5 g/dL — ABNORMAL LOW (ref 3.6–4.8)
Bilirubin Total: 0.4 mg/dL (ref 0.0–1.2)
Bilirubin, Direct: 0.14 mg/dL (ref 0.00–0.40)
Total Protein: 5.8 g/dL — ABNORMAL LOW (ref 6.0–8.5)

## 2015-04-02 LAB — FERRITIN: FERRITIN: 19 ng/mL (ref 15–150)

## 2015-04-02 LAB — BASIC METABOLIC PANEL
BUN/Creatinine Ratio: 23 (ref 11–26)
BUN: 17 mg/dL (ref 8–27)
CALCIUM: 8.7 mg/dL (ref 8.7–10.3)
CHLORIDE: 107 mmol/L (ref 97–108)
CO2: 21 mmol/L (ref 18–29)
CREATININE: 0.74 mg/dL (ref 0.57–1.00)
GFR calc Af Amer: 96 mL/min/{1.73_m2} (ref >59)
GFR calc non Af Amer: 83 mL/min/{1.73_m2} (ref >59)
Glucose: 81 mg/dL (ref 65–99)
Potassium: 4.3 mmol/L (ref 3.5–5.2)
Sodium: 142 mmol/L (ref 134–144)

## 2015-04-02 LAB — LIPID PANEL
CHOL/HDL RATIO: 2.5 ratio (ref 0.0–4.4)
Cholesterol, Total: 163 mg/dL (ref 100–199)
HDL: 66 mg/dL (ref >39)
LDL CALC: 86 mg/dL (ref 0–99)
TRIGLYCERIDES: 57 mg/dL (ref 0–149)
VLDL CHOLESTEROL CAL: 11 mg/dL (ref 5–40)

## 2015-04-02 LAB — VITAMIN D 25 HYDROXY (VIT D DEFICIENCY, FRACTURES): Vit D, 25-Hydroxy: 31.1 ng/mL (ref 30.0–100.0)

## 2015-04-02 LAB — FOLATE: FOLATE: 19.8 ng/mL (ref >3.0)

## 2015-04-02 LAB — VITAMIN B12: Vitamin B-12: 459 pg/mL (ref 211–946)

## 2015-04-08 ENCOUNTER — Encounter: Payer: Self-pay | Admitting: Family Medicine

## 2015-04-14 ENCOUNTER — Other Ambulatory Visit: Payer: Self-pay | Admitting: Endocrinology

## 2015-04-14 DIAGNOSIS — C73 Malignant neoplasm of thyroid gland: Secondary | ICD-10-CM

## 2015-04-20 ENCOUNTER — Ambulatory Visit (HOSPITAL_COMMUNITY): Payer: Medicare Other

## 2015-04-20 ENCOUNTER — Encounter (HOSPITAL_COMMUNITY)
Admission: RE | Admit: 2015-04-20 | Discharge: 2015-04-20 | Disposition: A | Discharge status: Home / Self Care | Payer: Medicare Other | Source: Ambulatory Visit | Attending: Endocrinology | Admitting: Endocrinology

## 2015-04-20 DIAGNOSIS — C73 Malignant neoplasm of thyroid gland: Secondary | ICD-10-CM

## 2015-04-20 MED ORDER — THYROTROPIN ALFA 1.1 MG IM SOLR
0.9000 mg | INTRAMUSCULAR | Status: AC
  Administered 2015-04-20: 0.9 mg via INTRAMUSCULAR
  Filled 2015-04-20 (×2): qty 0.9

## 2015-04-21 ENCOUNTER — Ambulatory Visit (HOSPITAL_COMMUNITY): Payer: Medicare Other

## 2015-04-21 ENCOUNTER — Encounter (HOSPITAL_COMMUNITY)
Admission: RE | Admit: 2015-04-21 | Discharge: 2015-04-21 | Disposition: A | Discharge status: Home / Self Care | Payer: Medicare Other | Source: Ambulatory Visit | Attending: Endocrinology | Admitting: Endocrinology

## 2015-04-21 DIAGNOSIS — C73 Malignant neoplasm of thyroid gland: Secondary | ICD-10-CM | POA: Diagnosis not present

## 2015-04-21 MED ORDER — THYROTROPIN ALFA 1.1 MG IM SOLR
0.9000 mg | INTRAMUSCULAR | Status: AC
  Administered 2015-04-21: 0.9 mg via INTRAMUSCULAR
  Filled 2015-04-21: qty 0.9

## 2015-04-22 ENCOUNTER — Encounter (HOSPITAL_COMMUNITY)
Admission: RE | Admit: 2015-04-22 | Discharge: 2015-04-22 | Disposition: A | Discharge status: Home / Self Care | Payer: Medicare Other | Source: Ambulatory Visit | Attending: Endocrinology | Admitting: Endocrinology

## 2015-04-22 ENCOUNTER — Encounter (HOSPITAL_COMMUNITY): Payer: Medicare Other

## 2015-04-24 ENCOUNTER — Encounter (HOSPITAL_COMMUNITY): Payer: Medicare Other

## 2015-04-24 ENCOUNTER — Encounter (HOSPITAL_COMMUNITY)
Admission: RE | Admit: 2015-04-24 | Discharge: 2015-04-24 | Disposition: A | Discharge status: Home / Self Care | Payer: Medicare Other | Source: Ambulatory Visit | Attending: Endocrinology | Admitting: Endocrinology

## 2015-04-24 DIAGNOSIS — Z9089 Acquired absence of other organs: Secondary | ICD-10-CM | POA: Diagnosis not present

## 2015-04-24 DIAGNOSIS — Z923 Personal history of irradiation: Secondary | ICD-10-CM | POA: Diagnosis not present

## 2015-04-24 DIAGNOSIS — Z08 Encounter for follow-up examination after completed treatment for malignant neoplasm: Secondary | ICD-10-CM | POA: Diagnosis not present

## 2015-04-24 DIAGNOSIS — C73 Malignant neoplasm of thyroid gland: Secondary | ICD-10-CM | POA: Diagnosis not present

## 2015-04-24 DIAGNOSIS — Z85850 Personal history of malignant neoplasm of thyroid: Secondary | ICD-10-CM | POA: Diagnosis not present

## 2015-04-24 MED ORDER — SODIUM IODIDE I 131 CAPSULE
4.0000 | Freq: Once | INTRAVENOUS | Status: AC | PRN
  Administered 2015-04-24: 4 via ORAL

## 2015-07-27 ENCOUNTER — Telehealth: Payer: Self-pay | Admitting: Family Medicine

## 2015-07-27 NOTE — Telephone Encounter (Signed)
Pt seen in Apr, a letter mailed which told her to go back on some iron supplements She is wanting to know if you think she needs to have some other supplements added? She thinks she needs some since her gastric bypass but was unsure since you did not  Mention them in her letter sent on the 27th to her.   Please advise

## 2015-07-27 NOTE — Telephone Encounter (Signed)
T00 folic acid and vit d were all fine but with hx of gast byp should take vit D with calciyum two tabs daily, one multivit daily, one iron gluconate or iron sulfate tab twice per day for a month and then daily

## 2015-07-27 NOTE — Telephone Encounter (Signed)
Notified patient [name] folic acid and vit d were all fine but with hx of gastric bypass should take vit D with calcium two tabs daily, one multivitamin daily, one iron gluconate or iron sulfate tab twice per day for a month and then daily. Patient verbalized understanding.

## 2015-07-30 ENCOUNTER — Other Ambulatory Visit: Payer: Self-pay

## 2015-07-30 ENCOUNTER — Other Ambulatory Visit: Payer: Self-pay | Admitting: Family Medicine

## 2015-07-30 DIAGNOSIS — Z1231 Encounter for screening mammogram for malignant neoplasm of breast: Secondary | ICD-10-CM

## 2015-09-01 ENCOUNTER — Ambulatory Visit
Admission: RE | Admit: 2015-09-01 | Discharge: 2015-09-01 | Disposition: A | Discharge status: Home / Self Care | Payer: Medicare Other | Source: Ambulatory Visit

## 2015-09-01 DIAGNOSIS — Z85850 Personal history of malignant neoplasm of thyroid: Secondary | ICD-10-CM | POA: Diagnosis not present

## 2015-09-01 DIAGNOSIS — E890 Postprocedural hypothyroidism: Secondary | ICD-10-CM | POA: Diagnosis not present

## 2015-09-01 DIAGNOSIS — Z1231 Encounter for screening mammogram for malignant neoplasm of breast: Secondary | ICD-10-CM

## 2015-11-02 ENCOUNTER — Ambulatory Visit: Payer: Medicare Other | Admitting: Family Medicine

## 2015-11-03 ENCOUNTER — Other Ambulatory Visit: Payer: Self-pay | Admitting: Family Medicine

## 2015-11-11 ENCOUNTER — Other Ambulatory Visit: Payer: Self-pay | Admitting: Family Medicine

## 2015-11-23 ENCOUNTER — Encounter: Payer: Self-pay | Admitting: Family Medicine

## 2015-11-23 ENCOUNTER — Ambulatory Visit (INDEPENDENT_AMBULATORY_CARE_PROVIDER_SITE_OTHER): Payer: Medicare Other | Admitting: Family Medicine

## 2015-11-23 VITALS — BP 136/84 | Ht 67.0 in | Wt 172.8 lb

## 2015-11-23 DIAGNOSIS — Z23 Encounter for immunization: Secondary | ICD-10-CM | POA: Diagnosis not present

## 2015-11-23 DIAGNOSIS — I10 Essential (primary) hypertension: Secondary | ICD-10-CM | POA: Diagnosis not present

## 2015-11-23 MED ORDER — ENALAPRIL MALEATE 5 MG PO TABS: ORAL_TABLET | ORAL | Status: DC

## 2015-11-23 MED ORDER — LEVOTHYROXINE SODIUM 175 MCG PO TABS: 175.0000 ug | ORAL_TABLET | ORAL | Status: DC

## 2015-11-23 NOTE — Progress Notes (Signed)
   Subjective:    Patient ID: [name], female    DOB: 1945-12-19, 69 y.o.   MRN: [id number]  Hypertension This is a chronic problem. The current episode started more than 1 year ago. Risk factors for coronary artery disease include post-menopausal state. Treatments tried: vasotec. There are no compliance problems.    bp generally good when cked at [employer]  Exercise so so slack at tiems  On chronic meds, using  Magnesium and b 12 and vits  Due for new bvaccine , Blood pressure good when checked elsewhere. 132 78 Review of Systems No headache no chest pain and back pain no abdominal pain no change in bowel habits    Objective:   Physical Exam  Alert no acute distress. Vital stable blood pressure excellent on repeat 130/78 HEENT normal lungs clear. Heart regular in rhythm.      Assessment & Plan:  Impression hypertension good control discussed plan maintain same meds diet exercise discussed follow-up as scheduled WSL

## 2016-02-29 DIAGNOSIS — Z85850 Personal history of malignant neoplasm of thyroid: Secondary | ICD-10-CM | POA: Diagnosis not present

## 2016-02-29 DIAGNOSIS — E890 Postprocedural hypothyroidism: Secondary | ICD-10-CM | POA: Diagnosis not present

## 2016-04-27 ENCOUNTER — Other Ambulatory Visit: Payer: Self-pay | Admitting: Family Medicine

## 2016-05-10 ENCOUNTER — Other Ambulatory Visit: Payer: Self-pay | Admitting: Family Medicine

## 2016-05-20 ENCOUNTER — Ambulatory Visit (INDEPENDENT_AMBULATORY_CARE_PROVIDER_SITE_OTHER): Payer: Medicare Other | Admitting: Family Medicine

## 2016-05-20 ENCOUNTER — Encounter: Payer: Self-pay | Admitting: Family Medicine

## 2016-05-20 VITALS — BP 138/82 | Ht 67.0 in | Wt 177.0 lb

## 2016-05-20 DIAGNOSIS — Z139 Encounter for screening, unspecified: Secondary | ICD-10-CM

## 2016-05-20 DIAGNOSIS — R5383 Other fatigue: Secondary | ICD-10-CM

## 2016-05-20 DIAGNOSIS — M791 Myalgia, unspecified site: Secondary | ICD-10-CM

## 2016-05-20 DIAGNOSIS — E038 Other specified hypothyroidism: Secondary | ICD-10-CM | POA: Diagnosis not present

## 2016-05-20 DIAGNOSIS — E785 Hyperlipidemia, unspecified: Secondary | ICD-10-CM | POA: Diagnosis not present

## 2016-05-20 DIAGNOSIS — I10 Essential (primary) hypertension: Secondary | ICD-10-CM | POA: Diagnosis not present

## 2016-05-20 DIAGNOSIS — K219 Gastro-esophageal reflux disease without esophagitis: Secondary | ICD-10-CM | POA: Diagnosis not present

## 2016-05-20 MED ORDER — ENALAPRIL MALEATE 5 MG PO TABS: ORAL_TABLET | ORAL | Status: DC

## 2016-05-20 MED ORDER — LEVOTHYROXINE SODIUM 175 MCG PO TABS: 175.0000 ug | ORAL_TABLET | ORAL | Status: DC

## 2016-05-20 NOTE — Progress Notes (Signed)
   Subjective:    Patient ID: [name], female    DOB: 04/16/1946, 70 y.o.   MRN: [id number]  Hypertension This is a chronic problem. The current episode started more than 1 year ago. Compliance problems include exercise (some walking but not much, eats healthy).   Patient claims compliance with blood pressure medication. No obvious side effects. Watching salt intake  Compliant with thyroid medicine no symptoms of high or low thyroid. Does not miss a dose. No obvious side effects with the medication  History of bariatric surgery with ongoing need to check vitamins. Patient takes extra supplementation. See medication list   Wants to discuss bruising easily. Taking asa 81mg . Patient has challenges with easy bruisability. He seemed bleeding with minimal injuries. His been on aspirin for long time not really sure why other than family history   Review of Systems No headache, no major weight loss or weight gain, no chest pain no back pain abdominal pain no change in bowel habits complete ROS otherwise negative     Objective:   Physical Exam Alert no acute distress. Blood pressure improved on repeat. HEENT normal. Thyroid not palpable. Lungs clear. Heart regular in rhythm.       Assessment & Plan:  #1 hypertension good control meds reviewed to maintain same #2 hypothyroidism symptoms remitted need to check blood work #3 status post bariatric surgery with uncertain status of vitamins #4 hyperlipidemia, history of, status uncertain plan appropriate blood work. Diet exercise discussed. All medications refilled. Recheck in 6 months WSL

## 2016-05-22 DIAGNOSIS — E039 Hypothyroidism, unspecified: Secondary | ICD-10-CM | POA: Insufficient documentation

## 2016-05-30 DIAGNOSIS — R5383 Other fatigue: Secondary | ICD-10-CM | POA: Diagnosis not present

## 2016-05-30 DIAGNOSIS — Z139 Encounter for screening, unspecified: Secondary | ICD-10-CM | POA: Diagnosis not present

## 2016-05-30 DIAGNOSIS — M791 Myalgia: Secondary | ICD-10-CM | POA: Diagnosis not present

## 2016-05-31 ENCOUNTER — Encounter: Payer: Self-pay | Admitting: Family Medicine

## 2016-05-31 LAB — BASIC METABOLIC PANEL
BUN/Creatinine Ratio: 23 (ref 12–28)
BUN: 18 mg/dL (ref 8–27)
CALCIUM: 8.6 mg/dL — AB (ref 8.7–10.3)
CHLORIDE: 107 mmol/L — AB (ref 96–106)
CO2: 21 mmol/L (ref 18–29)
Creatinine, Ser: 0.79 mg/dL (ref 0.57–1.00)
GFR calc Af Amer: 88 mL/min/{1.73_m2} (ref >59)
GFR, EST NON AFRICAN AMERICAN: 76 mL/min/{1.73_m2} (ref >59)
GLUCOSE: 87 mg/dL (ref 65–99)
POTASSIUM: 4.3 mmol/L (ref 3.5–5.2)
Sodium: 141 mmol/L (ref 134–144)

## 2016-05-31 LAB — CBC WITH DIFFERENTIAL/PLATELET
BASOS ABS: 0 10*3/uL (ref 0.0–0.2)
BASOS: 0 %
EOS (ABSOLUTE): 0.3 10*3/uL (ref 0.0–0.4)
Eos: 5 %
Hematocrit: 39.2 % (ref 34.0–46.6)
Hemoglobin: 13.1 g/dL (ref 11.1–15.9)
IMMATURE GRANS (ABS): 0 10*3/uL (ref 0.0–0.1)
IMMATURE GRANULOCYTES: 0 %
LYMPHS: 44 %
Lymphocytes Absolute: 2.5 10*3/uL (ref 0.7–3.1)
MCH: 28.7 pg (ref 26.6–33.0)
MCHC: 33.4 g/dL (ref 31.5–35.7)
MCV: 86 fL (ref 79–97)
MONOS ABS: 0.4 10*3/uL (ref 0.1–0.9)
Monocytes: 8 %
NEUTROS PCT: 43 %
Neutrophils Absolute: 2.6 10*3/uL (ref 1.4–7.0)
PLATELETS: 253 10*3/uL (ref 150–379)
RBC: 4.56 x10E6/uL (ref 3.77–5.28)
RDW: 15.1 % (ref 12.3–15.4)
WBC: 5.9 10*3/uL (ref 3.4–10.8)

## 2016-05-31 LAB — HEPATIC FUNCTION PANEL
ALT: 18 IU/L (ref 0–32)
AST: 15 IU/L (ref 0–40)
Albumin: 3.6 g/dL (ref 3.5–4.8)
Alkaline Phosphatase: 67 IU/L (ref 39–117)
Bilirubin Total: 0.6 mg/dL (ref 0.0–1.2)
Bilirubin, Direct: 0.17 mg/dL (ref 0.00–0.40)
Total Protein: 6 g/dL (ref 6.0–8.5)

## 2016-05-31 LAB — VITAMIN D 25 HYDROXY (VIT D DEFICIENCY, FRACTURES): Vit D, 25-Hydroxy: 35.8 ng/mL (ref 30.0–100.0)

## 2016-05-31 LAB — FOLATE: Folate: >20 ng/mL (ref >3.0)

## 2016-05-31 LAB — T4: T4, Total: 7.6 ug/dL (ref 4.5–12.0)

## 2016-05-31 LAB — FERRITIN: Ferritin: 55 ng/mL (ref 15–150)

## 2016-05-31 LAB — LIPID PANEL
Chol/HDL Ratio: 2.5 ratio (ref 0.0–4.4)
Cholesterol, Total: 159 mg/dL (ref 100–199)
HDL: 63 mg/dL (ref >39)
LDL Calculated: 80 mg/dL (ref 0–99)
Triglycerides: 78 mg/dL (ref 0–149)
VLDL Cholesterol Cal: 16 mg/dL (ref 5–40)

## 2016-05-31 LAB — VITAMIN B12: Vitamin B-12: 346 pg/mL (ref 211–946)

## 2016-05-31 LAB — TSH: TSH: 0.574 u[IU]/mL (ref 0.450–4.500)

## 2016-07-26 ENCOUNTER — Other Ambulatory Visit: Payer: Self-pay | Admitting: Family Medicine

## 2016-07-26 DIAGNOSIS — Z1231 Encounter for screening mammogram for malignant neoplasm of breast: Secondary | ICD-10-CM

## 2016-09-05 ENCOUNTER — Ambulatory Visit
Admission: RE | Admit: 2016-09-05 | Discharge: 2016-09-05 | Disposition: A | Discharge status: Home / Self Care | Payer: Medicare Other | Source: Ambulatory Visit | Attending: Family Medicine | Admitting: Family Medicine

## 2016-09-05 DIAGNOSIS — Z1231 Encounter for screening mammogram for malignant neoplasm of breast: Secondary | ICD-10-CM

## 2016-09-05 DIAGNOSIS — Z85850 Personal history of malignant neoplasm of thyroid: Secondary | ICD-10-CM | POA: Diagnosis not present

## 2016-09-05 DIAGNOSIS — E890 Postprocedural hypothyroidism: Secondary | ICD-10-CM | POA: Diagnosis not present

## 2016-09-20 ENCOUNTER — Other Ambulatory Visit: Payer: Self-pay | Admitting: *Deleted

## 2016-09-20 ENCOUNTER — Telehealth: Payer: Self-pay | Admitting: Family Medicine

## 2016-09-20 MED ORDER — SYNTHROID 175 MCG PO TABS: 175.0000 ug | ORAL_TABLET | Freq: Every day | ORAL | 5 refills | Status: DC

## 2016-09-20 NOTE — Telephone Encounter (Signed)
Pt is requesting levothyroxine (SYNTHROID, LEVOTHROID) 175 MCG tablet Pt states that she is needing the brand name is called in because she does not have a thyroid and her endocrinologist stated that she has to have the brand name.   [facility] [place]

## 2016-09-20 NOTE — Telephone Encounter (Signed)
Left message on voicemail notifying patient that med was sent to pharmacy.  

## 2016-09-20 NOTE — Telephone Encounter (Signed)
Ok brand name six mo worth

## 2016-10-26 ENCOUNTER — Ambulatory Visit (INDEPENDENT_AMBULATORY_CARE_PROVIDER_SITE_OTHER): Payer: Medicare Other

## 2016-10-26 DIAGNOSIS — Z23 Encounter for immunization: Secondary | ICD-10-CM | POA: Diagnosis not present

## 2016-11-12 ENCOUNTER — Other Ambulatory Visit: Payer: Self-pay | Admitting: Family Medicine

## 2016-11-21 ENCOUNTER — Ambulatory Visit: Payer: Medicare Other | Admitting: Family Medicine

## 2016-12-07 ENCOUNTER — Ambulatory Visit (INDEPENDENT_AMBULATORY_CARE_PROVIDER_SITE_OTHER): Payer: Medicare Other | Admitting: Family Medicine

## 2016-12-07 ENCOUNTER — Encounter: Payer: Self-pay | Admitting: Family Medicine

## 2016-12-07 VITALS — BP 138/90 | Ht 67.0 in | Wt 173.0 lb

## 2016-12-07 DIAGNOSIS — R21 Rash and other nonspecific skin eruption: Secondary | ICD-10-CM | POA: Diagnosis not present

## 2016-12-07 DIAGNOSIS — E038 Other specified hypothyroidism: Secondary | ICD-10-CM

## 2016-12-07 DIAGNOSIS — I10 Essential (primary) hypertension: Secondary | ICD-10-CM | POA: Diagnosis not present

## 2016-12-07 MED ORDER — ENALAPRIL MALEATE 5 MG PO TABS: ORAL_TABLET | ORAL | 1 refills | Status: DC

## 2016-12-07 MED ORDER — SYNTHROID 175 MCG PO TABS: 175.0000 ug | ORAL_TABLET | Freq: Every day | ORAL | 1 refills | Status: DC

## 2016-12-07 NOTE — Progress Notes (Signed)
Subjective:    Patient ID: [name], female    DOB: March 09, 1946, 70 y.o.   MRN: [id number]  Hypertension  This is a chronic problem. The current episode started more than 1 year ago. The problem has been gradually improving since onset. There are no associated agents to hypertension. There are no known risk factors for coronary artery disease. Treatments tried: enalapril. The current treatment provides moderate improvement. There are no compliance problems.    Results for orders placed or performed in visit on 05/20/16  TSH  Result Value Ref Range   TSH 0.574 0.450 - 4.500 uIU/mL  T4  Result Value Ref Range   T4, Total 7.6 4.5 - 12.0 ug/dL  Ferritin  Result Value Ref Range   Ferritin 55 15 - 150 ng/mL  Folate  Result Value Ref Range   Folate >20.0 >3.0 ng/mL  Vitamin B12  Result Value Ref Range   Vitamin B-12 346 211 - 946 pg/mL  VITAMIN D 25 Hydroxy (Vit-D Deficiency, Fractures)  Result Value Ref Range   Vit D, 25-Hydroxy 35.8 30.0 - 100.0 ng/mL  Lipid panel  Result Value Ref Range   Cholesterol, Total 159 100 - 199 mg/dL   Triglycerides 78 0 - 149 mg/dL   HDL 63 >39 mg/dL   VLDL Cholesterol Cal 16 5 - 40 mg/dL   LDL Calculated 80 0 - 99 mg/dL   Chol/HDL Ratio 2.5 0.0 - 4.4 ratio units  Hepatic function panel  Result Value Ref Range   Total Protein 6.0 6.0 - 8.5 g/dL   Albumin 3.6 3.5 - 4.8 g/dL   Bilirubin Total 0.6 0.0 - 1.2 mg/dL   Bilirubin, Direct 0.17 0.00 - 0.40 mg/dL   Alkaline Phosphatase 67 39 - 117 IU/L   AST 15 0 - 40 IU/L   ALT 18 0 - 32 IU/L  Basic metabolic panel  Result Value Ref Range   Glucose 87 65 - 99 mg/dL   BUN 18 8 - 27 mg/dL   Creatinine, Ser 0.79 0.57 - 1.00 mg/dL   GFR calc non Af Amer 76 >59 mL/min/1.73   GFR calc Af Amer 88 >59 mL/min/1.73   BUN/Creatinine Ratio 23 12 - 28   Sodium 141 134 - 144 mmol/L   Potassium 4.3 3.5 - 5.2 mmol/L   Chloride 107 (H) 96 - 106 mmol/L   CO2 21 18 - 29 mmol/L   Calcium 8.6 (L) 8.7 - 10.3  mg/dL  CBC with Differential/Platelet  Result Value Ref Range   WBC 5.9 3.4 - 10.8 x10E3/uL   RBC 4.56 3.77 - 5.28 x10E6/uL   Hemoglobin 13.1 11.1 - 15.9 g/dL   Hematocrit 39.2 34.0 - 46.6 %   MCV 86 79 - 97 fL   MCH 28.7 26.6 - 33.0 pg   MCHC 33.4 31.5 - 35.7 g/dL   RDW 15.1 12.3 - 15.4 %   Platelets 253 150 - 379 x10E3/uL   Neutrophils 43 %   Lymphs 44 %   Monocytes 8 %   Eos 5 %   Basos 0 %   Neutrophils Absolute 2.6 1.4 - 7.0 x10E3/uL   Lymphocytes Absolute 2.5 0.7 - 3.1 x10E3/uL   Monocytes Absolute 0.4 0.1 - 0.9 x10E3/uL   EOS (ABSOLUTE) 0.3 0.0 - 0.4 x10E3/uL   Basophils Absolute 0.0 0.0 - 0.2 x10E3/uL   Immature Granulocytes 0 %   Immature Grans (Abs) 0.0 0.0 - 0.1 x10E3/uL   Small bump lower lip  Patient has a  blister on her lip and some spots on her face she would like the doctor to take a look at today.   Patient claims compliance with thyroid medication. Prior blood work reviewed. Does not miss a dose. No symptoms of high or low thyroid  Review of Systems No headache, no major weight loss or weight gain, no chest pain no back pain abdominal pain no change in bowel habits complete ROS otherwise negative     Objective:   Physical Exam  Alert vitals stable, NAD. Blood pressure good on repeat. HEENT normal. Lungs clear. Heart regular rate and rhythm. Right lower lip venous lake present left lateral face seborrheic keratosis      Assessment & Plan:  Impression 1 hypertension discussed good control maintain same #2 venous lake of lip reassured #3 seborrheic keratosis face discussed #4 hypothyroidism discussed maintain same plan diet exercise discussed follow-up in 6 months medications refilled

## 2016-12-27 ENCOUNTER — Observation Stay (HOSPITAL_COMMUNITY)
Admission: EM | Admit: 2016-12-27 | Discharge: 2016-12-29 | Disposition: A | Discharge status: Home / Self Care | Payer: Medicare HMO | Attending: Nephrology | Admitting: Nephrology

## 2016-12-27 ENCOUNTER — Telehealth: Payer: Self-pay | Admitting: *Deleted

## 2016-12-27 ENCOUNTER — Emergency Department (HOSPITAL_COMMUNITY): Payer: Medicare HMO

## 2016-12-27 ENCOUNTER — Encounter (HOSPITAL_COMMUNITY): Payer: Self-pay | Admitting: Emergency Medicine

## 2016-12-27 DIAGNOSIS — R0789 Other chest pain: Principal | ICD-10-CM | POA: Insufficient documentation

## 2016-12-27 DIAGNOSIS — R0602 Shortness of breath: Secondary | ICD-10-CM | POA: Insufficient documentation

## 2016-12-27 DIAGNOSIS — Z79899 Other long term (current) drug therapy: Secondary | ICD-10-CM | POA: Insufficient documentation

## 2016-12-27 DIAGNOSIS — E039 Hypothyroidism, unspecified: Secondary | ICD-10-CM | POA: Diagnosis not present

## 2016-12-27 DIAGNOSIS — R079 Chest pain, unspecified: Secondary | ICD-10-CM | POA: Diagnosis not present

## 2016-12-27 DIAGNOSIS — Z85850 Personal history of malignant neoplasm of thyroid: Secondary | ICD-10-CM | POA: Diagnosis not present

## 2016-12-27 DIAGNOSIS — E038 Other specified hypothyroidism: Secondary | ICD-10-CM | POA: Diagnosis not present

## 2016-12-27 DIAGNOSIS — I10 Essential (primary) hypertension: Secondary | ICD-10-CM | POA: Diagnosis present

## 2016-12-27 DIAGNOSIS — E785 Hyperlipidemia, unspecified: Secondary | ICD-10-CM | POA: Diagnosis present

## 2016-12-27 DIAGNOSIS — R072 Precordial pain: Secondary | ICD-10-CM | POA: Diagnosis not present

## 2016-12-27 HISTORY — DX: Essential (primary) hypertension: I10

## 2016-12-27 LAB — BASIC METABOLIC PANEL
Anion gap: 4 — ABNORMAL LOW (ref 5–15)
BUN: 20 mg/dL (ref 6–20)
CALCIUM: 8.9 mg/dL (ref 8.9–10.3)
CO2: 24 mmol/L (ref 22–32)
CREATININE: 0.73 mg/dL (ref 0.44–1.00)
Chloride: 109 mmol/L (ref 101–111)
GFR calc Af Amer: >60 mL/min (ref >60)
GLUCOSE: 92 mg/dL (ref 65–99)
POTASSIUM: 3.9 mmol/L (ref 3.5–5.1)
SODIUM: 137 mmol/L (ref 135–145)

## 2016-12-27 LAB — TROPONIN I

## 2016-12-27 LAB — CBC
HEMATOCRIT: 38.8 % (ref 36.0–46.0)
Hemoglobin: 12.9 g/dL (ref 12.0–15.0)
MCH: 29.7 pg (ref 26.0–34.0)
MCHC: 33.2 g/dL (ref 30.0–36.0)
MCV: 89.4 fL (ref 78.0–100.0)
PLATELETS: 229 10*3/uL (ref 150–400)
RBC: 4.34 MIL/uL (ref 3.87–5.11)
RDW: 14 % (ref 11.5–15.5)
WBC: 6.1 10*3/uL (ref 4.0–10.5)

## 2016-12-27 LAB — TSH: TSH: 0.041 u[IU]/mL — ABNORMAL LOW (ref 0.350–4.500)

## 2016-12-27 MED ORDER — ENOXAPARIN SODIUM 40 MG/0.4ML ~~LOC~~ SOLN
40.0000 mg | SUBCUTANEOUS | Status: DC
  Administered 2016-12-27 – 2016-12-28 (×2): 40 mg via SUBCUTANEOUS
  Filled 2016-12-27 (×2): qty 0.4

## 2016-12-27 MED ORDER — ONDANSETRON HCL 4 MG/2ML IJ SOLN: 4.0000 mg | Freq: Four times a day (QID) | INTRAMUSCULAR | Status: DC | PRN

## 2016-12-27 MED ORDER — NITROGLYCERIN 2 % TD OINT
1.0000 [in_us] | TOPICAL_OINTMENT | Freq: Four times a day (QID) | TRANSDERMAL | Status: DC
  Administered 2016-12-28 – 2016-12-29 (×4): 1 [in_us] via TOPICAL
  Filled 2016-12-27 (×5): qty 1

## 2016-12-27 MED ORDER — LEVOTHYROXINE SODIUM 75 MCG PO TABS
175.0000 ug | ORAL_TABLET | Freq: Every day | ORAL | Status: DC
  Administered 2016-12-28 – 2016-12-29 (×2): 175 ug via ORAL
  Filled 2016-12-27 (×2): qty 1

## 2016-12-27 MED ORDER — ASPIRIN EC 81 MG PO TBEC
81.0000 mg | DELAYED_RELEASE_TABLET | Freq: Every day | ORAL | Status: DC
  Administered 2016-12-28 – 2016-12-29 (×2): 81 mg via ORAL
  Filled 2016-12-27 (×2): qty 1

## 2016-12-27 MED ORDER — ASPIRIN 81 MG PO CHEW
324.0000 mg | CHEWABLE_TABLET | Freq: Once | ORAL | Status: AC
  Administered 2016-12-27: 324 mg via ORAL
  Filled 2016-12-27: qty 4

## 2016-12-27 MED ORDER — NITROGLYCERIN 0.4 MG SL SUBL
0.4000 mg | SUBLINGUAL_TABLET | SUBLINGUAL | Status: DC | PRN
  Filled 2016-12-27: qty 1

## 2016-12-27 MED ORDER — ENALAPRIL MALEATE 5 MG PO TABS
5.0000 mg | ORAL_TABLET | Freq: Every day | ORAL | Status: DC
  Administered 2016-12-27 – 2016-12-28 (×2): 5 mg via ORAL
  Filled 2016-12-27 (×3): qty 1

## 2016-12-27 MED ORDER — ATORVASTATIN CALCIUM 20 MG PO TABS
20.0000 mg | ORAL_TABLET | Freq: Every day | ORAL | Status: DC
  Administered 2016-12-28: 20 mg via ORAL
  Filled 2016-12-27: qty 1

## 2016-12-27 MED ORDER — ACETAMINOPHEN 325 MG PO TABS
650.0000 mg | ORAL_TABLET | ORAL | Status: DC | PRN
  Administered 2016-12-29: 650 mg via ORAL
  Filled 2016-12-27: qty 2

## 2016-12-27 MED ORDER — NITROGLYCERIN 0.4 MG SL SUBL
SUBLINGUAL_TABLET | SUBLINGUAL | Status: AC
  Administered 2016-12-27: 0.4 mg
  Filled 2016-12-27: qty 1

## 2016-12-27 NOTE — ED Triage Notes (Signed)
Pt reports cp and tightness without radiation 1 week ago and has been intermittent.  Pt reports it happening again today, and has sob.  Pt has taken asa each time she has had pain.  Pt alert and oriented.

## 2016-12-27 NOTE — ED Notes (Signed)
EKG given to Dr. Mesner. 

## 2016-12-27 NOTE — ED Provider Notes (Addendum)
[facility] DEPT Provider Note   CSN: [id number] Arrival date & time: 12/27/16  1705     History   Chief Complaint Chief Complaint  Patient presents with  . Chest Pain    HPI [name] is a 71 y.o. female.   Chest Pain   This is a new problem. The current episode started more than 1 week ago. The problem occurs daily. The problem has been gradually improving. The pain is present in the substernal region. The pain is mild. The quality of the pain is described as brief, pressure-like and heavy. The pain does not radiate. Associated symptoms include shortness of breath. Pertinent negatives include no abdominal pain, no cough, no diaphoresis and no dizziness. Treatments tried: aspirin. The treatment provided significant relief.    Past Medical History:  Diagnosis Date  . Cancer [facility])    Thyroid cancer  . Hypercholesteremia   . Hypertension   . Hypothyroidism     Patient Active Problem List   Diagnosis Date Noted  . Hypothyroidism 05/22/2016  . Essential hypertension, benign 09/09/2013  . Hyperlipidemia LDL goal <130 09/09/2013  . Esophageal reflux 09/09/2013    Past Surgical History:  Procedure Laterality Date  . ABDOMINAL HYSTERECTOMY    . BILATERAL SALPINGOOPHORECTOMY    . CHOLECYSTECTOMY    . COLONOSCOPY  11/14/2011   Procedure: COLONOSCOPY;  Surgeon: Daneil Dolin, MD;  Location: [facility] ENDO SUITE;  Service: Endoscopy;  Laterality: N/A;  . REPLACEMENT TOTAL KNEE BILATERAL    . ROUX-EN-Y PROCEDURE  2007  . TONSILLECTOMY    . TOTAL THYROIDECTOMY  12/2010  . TUBAL LIGATION      OB History    No data available       Home Medications    Prior to Admission medications   Medication Sig Start Date End Date Taking? Authorizing Provider  calcium citrate (CALCITRATE - DOSED IN MG ELEMENTAL CALCIUM) 950 MG tablet Take 2 tablets by mouth 2 (two) times daily.      Historical Provider, MD  Cholecalciferol (VITAMIN D-3) 5000 UNITS TABS Take 1 tablet by mouth  every morning.     Historical Provider, MD  enalapril (VASOTEC) 5 MG tablet TAKE 1 TABLET BY MOUTH  DAILY AT NIGHT 12/07/16   Mikey Kirschner, MD  ferrous sulfate 325 (65 FE) MG tablet Take 325 mg by mouth daily with breakfast.    Historical Provider, MD  Multiple Vitamins-Minerals (MULTIVITAMINS THER. W/MINERALS) TABS Take 1 tablet by mouth 2 (two) times daily.      Historical Provider, MD  SYNTHROID 175 MCG tablet Take 1 tablet (175 mcg total) by mouth daily before breakfast. 12/07/16   Mikey Kirschner, MD  valACYclovir (VALTREX) 1000 MG tablet TAKE 2 TABLETS AT FIRST SIGN OF FEVER BLISTER, THEN 2 TABLETS 2 HOURSLATER 05/10/16   Mikey Kirschner, MD  vitamin B-12 (CYANOCOBALAMIN) 500 MCG tablet Take 2,500 mcg by mouth every morning.     Historical Provider, MD    Family History Family History  Problem Relation Age of Onset  . Colon cancer Neg Hx     Social History Social History  Substance Use Topics  . Smoking status: Never Smoker  . Smokeless tobacco: Not on file  . Alcohol use No     Allergies   Codeine and Demerol   Review of Systems Review of Systems  Constitutional: Negative for diaphoresis.  Respiratory: Positive for shortness of breath. Negative for cough.   Cardiovascular: Positive for chest pain.  Gastrointestinal: Negative  for abdominal pain.  Neurological: Negative for dizziness.  All other systems reviewed and are negative.    Physical Exam Updated Vital Signs BP 126/71   Pulse 61   Temp 97.6 F (36.4 C) (Oral)   Resp 17   Ht 5\' 7"  (1.702 m)   Wt 172 lb (78 kg)   SpO2 99%   BMI 26.94 kg/m   Physical Exam  Constitutional: She appears well-developed and well-nourished.  HENT:  Head: Normocephalic and atraumatic.  Neck: Normal range of motion.  Cardiovascular: Normal rate and regular rhythm.   Pulmonary/Chest: No stridor. No respiratory distress.  Abdominal: She exhibits no distension.  Neurological: She is alert.  Nursing note and vitals  reviewed.    ED Treatments / Results  Labs (all labs ordered are listed, but only abnormal results are displayed) Labs Reviewed  BASIC METABOLIC PANEL - Abnormal; Notable for the following:       Result Value   Anion gap 4 (*)    All other components within normal limits  CBC  TROPONIN I    EKG  EKG Interpretation  Date/Time:  Tuesday December 27 2016 18:02:08 EST Ventricular Rate:  57 PR Interval:    QRS Duration: 108 QT Interval:  439 QTC Calculation: 428 R Axis:   111 Text Interpretation:  Right and left arm electrode reversal, interpretation assumes no reversal Sinus rhythm Probable lateral infarct, age indeterminate No significant change since last tracing aside from lead reversal Confirmed by [facility] MD, Corene Cornea [phone]) on 12/27/2016 6:24:30 PM       Radiology Dg Chest 2 View  Result Date: 12/27/2016 CLINICAL DATA:  Generalized chest pain and tightness for 1 week. History of hypertension, thyroid cancer. EXAM: CHEST  2 VIEW COMPARISON:  Chest radiograph December 24, 2010 FINDINGS: Cardiomediastinal silhouette is normal. No pleural effusions or focal consolidations. Mild hyperinflation. Trachea projects midline and there is no pneumothorax. Soft tissue planes and included osseous structures are non-suspicious. Surgical clips in the included right abdomen compatible with cholecystectomy. Osteopenia with mild degenerative change of the thoracic spine. Surgical clips at thoracic inlet compatible with thyroidectomy. IMPRESSION: Hyperinflation, no focal consolidation. Electronically Signed   By: Elon Alas M.D.   On: 12/27/2016 17:52    Procedures Procedures (including critical care time)  Medications Ordered in ED Medications  nitroGLYCERIN (NITROSTAT) SL tablet 0.4 mg (not administered)  aspirin chewable tablet 324 mg (324 mg Oral Given 12/27/16 1840)  nitroGLYCERIN (NITROSTAT) 0.4 MG SL tablet (0.4 mg  Given 12/27/16 1840)     Initial Impression / Assessment and Plan  / ED Course  I have reviewed the triage vital signs and the nursing notes.  Pertinent labs & imaging results that were available during my care of the patient were reviewed by me and considered in my medical decision making (see chart for details).  Clinical Course     Patient with chest pressure and associated dyspnea with it. H/o HTN, HLD. Strong family history of heart disease. Still with CP on arrival here but improved with NTG. Normal ecg/troponin, but 2/2 history and description of pain, would prefer overnight evaluation and stress test with cards consult. hospitalist consulted, will admit.   Final Clinical Impressions(s) / ED Diagnoses   Final diagnoses:  Chest pain    New Prescriptions New Prescriptions   No medications on file      Merrily Pew, MD 12/29/16 [phone]

## 2016-12-27 NOTE — Telephone Encounter (Signed)
Patient arrives with c/o chest tightness for several days. Consult with Dr Lilla Shook advised patient to go to ER for evaluation and treatment. Patient verbalized understanding and stated she was heading to ER.

## 2016-12-27 NOTE — H&P (Signed)
History and Physical    KANCHAN LIEFER [id number] DOB: [birth year] DOA: 12/27/2016  PCP: Mickie Hillier, MD Consultants:  [place] - endocrinology Patient coming from: home  - lives alone (has some people living in a basement apartment); [facility]: daughter, [phone]  Chief Complaint: chest pain  HPI: [name] is a 71 y.o. female with medical history significant of HTN, hypothyroidism, remote thyroid cancer, and h/o HLD prior to gastric bypass presenting with chest pain.  Her dad died at 50 during heart surgery and her brother died at 11 from a heart attack.  About a week ago, she noticed chest tightness/pressure with SOB.  Took 4 ASA and in about 30 minutes it resolved.  Non-exertional.  Has happened about 5 times since.  Her children were concerned about this.  It happened again this afternoon about 2pm.  She called PCP and he suggested to go to the ER.  Had another episode when she came to the ER.  It always happens at rest, once even about 1am while reading at night.  Slight SOB.  No nausea.  No diaphoresis.  Takes ASA and it resolves about 30 minutes later.     ED Course: Given NTG x 2, ASA 324 mg  Review of Systems: As per HPI; otherwise 10 point review of systems reviewed and negative.   Ambulatory Status:  Ambulates without difficulty  Past Medical History:  Diagnosis Date  . Cancer [facility])    Thyroid cancer  . Hypercholesteremia    resolved after gastric bypass  . Hypertension   . Hypothyroidism     Past Surgical History:  Procedure Laterality Date  . ABDOMINAL HYSTERECTOMY    . BILATERAL SALPINGOOPHORECTOMY    . CHOLECYSTECTOMY    . COLONOSCOPY  11/14/2011   Procedure: COLONOSCOPY;  Surgeon: Daneil Dolin, MD;  [facility];  Service: Endoscopy;  Laterality: N/A;  . REPLACEMENT TOTAL KNEE BILATERAL    . ROUX-EN-Y PROCEDURE  2007  . TONSILLECTOMY    . TOTAL THYROIDECTOMY  12/2010  . TUBAL LIGATION      Social History   Social History  .  Marital status: Widowed    Spouse name: N/A  . Number of children: N/A  . Years of education: N/A   Occupational History  . Retired Therapist, art    Social History Main Topics  . Smoking status: Never Smoker  . Smokeless tobacco: Never Used  . Alcohol use No  . Drug use: No  . Sexual activity: Not on file   Other Topics Concern  . Not on file   Social History Narrative  . No narrative on file    Allergies  Allergen Reactions  . Codeine Nausea And Vomiting  . Demerol Nausea And Vomiting    Family History  Problem Relation Age of Onset  . Pneumonia Mother 56  . CAD Father 29  . CAD Sister 11  . CAD Brother 32  . Other Maternal Grandmother 96  . Other Brother 84    drug overdose  . CAD Other 107  . Colon cancer Neg Hx     Prior to Admission medications   Medication Sig Start Date End Date Taking? Authorizing Provider  calcium citrate (CALCITRATE - DOSED IN MG ELEMENTAL CALCIUM) 950 MG tablet Take 2 tablets by mouth 2 (two) times daily.      Historical Provider, MD  Cholecalciferol (VITAMIN D-3) 5000 UNITS TABS Take 1 tablet by mouth every morning.     Historical Provider, MD  enalapril (VASOTEC) 5 MG tablet TAKE 1 TABLET BY MOUTH  DAILY AT NIGHT 12/07/16   Mikey Kirschner, MD  ferrous sulfate 325 (65 FE) MG tablet Take 325 mg by mouth daily with breakfast.    Historical Provider, MD  Multiple Vitamins-Minerals (MULTIVITAMINS THER. W/MINERALS) TABS Take 1 tablet by mouth 2 (two) times daily.      Historical Provider, MD  SYNTHROID 175 MCG tablet Take 1 tablet (175 mcg total) by mouth daily before breakfast. 12/07/16   Mikey Kirschner, MD  valACYclovir (VALTREX) 1000 MG tablet TAKE 2 TABLETS AT FIRST SIGN OF FEVER BLISTER, THEN 2 TABLETS 2 HOURSLATER 05/10/16   Mikey Kirschner, MD  vitamin B-12 (CYANOCOBALAMIN) 500 MCG tablet Take 2,500 mcg by mouth every morning.     Historical Provider, MD    Physical Exam: Vitals:   12/27/16 1830 12/27/16 1900 12/27/16 1930  12/27/16 2000  BP: 131/77 126/71 124/72 147/74  Pulse: 62 61 (!) 59 64  Resp: 21 17 18 14   Temp:      TempSrc:      SpO2: 100% 99% 100% 100%  Weight:      Height:         General:  Appears calm and comfortable and is NAD Eyes:  PERRL, EOMI, normal lids, iris ENT:  grossly normal hearing, lips & tongue, mmm Neck:  no LAD, masses or thyromegaly Cardiovascular:  RRR, no m/r/g. No LE edema.  Respiratory:  CTA bilaterally, no w/r/r. Normal respiratory effort. Abdomen:  soft, ntnd, NABS Skin:  no rash or induration seen on limited exam Musculoskeletal:  grossly normal tone BUE/BLE, good ROM, no bony abnormality Psychiatric:  grossly normal mood and affect, speech fluent and appropriate, AOx3 Neurologic:  CN 2-12 grossly intact, moves all extremities in coordinated fashion, sensation intact  Labs on Admission: I have personally reviewed following labs and imaging studies  CBC:  Recent Labs Lab 12/27/16 1727  WBC 6.1  HGB 12.9  HCT 38.8  MCV 89.4  PLT [id number]   Basic Metabolic Panel:  Recent Labs Lab 12/27/16 1727  NA 137  K 3.9  CL 109  CO2 24  GLUCOSE 92  BUN 20  CREATININE 0.73  CALCIUM 8.9   GFR: Estimated Creatinine Clearance: 69.4 mL/min (by C-G formula based on SCr of 0.73 mg/dL). Liver Function Tests: No results for input(s): AST, ALT, ALKPHOS, BILITOT, PROT, ALBUMIN in the last 168 hours. No results for input(s): LIPASE, AMYLASE in the last 168 hours. No results for input(s): AMMONIA in the last 168 hours. Coagulation Profile: No results for input(s): INR, PROTIME in the last 168 hours. Cardiac Enzymes:  Recent Labs Lab 12/27/16 1727  TROPONINI <0.03   BNP (last 3 results) No results for input(s): PROBNP in the last 8760 hours. HbA1C: No results for input(s): HGBA1C in the last 72 hours. CBG: No results for input(s): GLUCAP in the last 168 hours. Lipid Profile: No results for input(s): CHOL, HDL, LDLCALC, TRIG, CHOLHDL, LDLDIRECT in the last 72  hours. Thyroid Function Tests: No results for input(s): TSH, T4TOTAL, FREET4, T3FREE, THYROIDAB in the last 72 hours. Anemia Panel: No results for input(s): VITAMINB12, FOLATE, FERRITIN, TIBC, IRON, RETICCTPCT in the last 72 hours. Urine analysis:    Component Value Date/Time   COLORURINE YELLOW 12/24/2010 0945   APPEARANCEUR CLOUDY (A) 12/24/2010 0945   LABSPEC 1.018 12/24/2010 0945   PHURINE 5.5 12/24/2010 0945   HGBUR NEGATIVE 12/24/2010 0945   BILIRUBINUR NEGATIVE 12/24/2010 0945   KETONESUR NEGATIVE  12/24/2010 0945   PROTEINUR NEGATIVE 12/24/2010 0945   UROBILINOGEN 0.2 12/24/2010 0945   NITRITE NEGATIVE 12/24/2010 0945   LEUKOCYTESUR  12/24/2010 0945    NEGATIVE MICROSCOPIC NOT DONE ON URINES WITH NEGATIVE PROTEIN, BLOOD, LEUKOCYTES, NITRITE, OR GLUCOSE <1000 mg/dL.    Creatinine Clearance: Estimated Creatinine Clearance: 69.4 mL/min (by C-G formula based on SCr of 0.73 mg/dL).  Sepsis Labs: @LABRCNTIP (procalcitonin:4,lacticidven:4) )No results found for this or any previous visit (from the past 240 hour(s)).   Radiological Exams on Admission: Dg Chest 2 View  Result Date: 12/27/2016 CLINICAL DATA:  Generalized chest pain and tightness for 1 week. History of hypertension, thyroid cancer. EXAM: CHEST  2 VIEW COMPARISON:  Chest radiograph December 24, 2010 FINDINGS: Cardiomediastinal silhouette is normal. No pleural effusions or focal consolidations. Mild hyperinflation. Trachea projects midline and there is no pneumothorax. Soft tissue planes and included osseous structures are non-suspicious. Surgical clips in the included right abdomen compatible with cholecystectomy. Osteopenia with mild degenerative change of the thoracic spine. Surgical clips at thoracic inlet compatible with thyroidectomy. IMPRESSION: Hyperinflation, no focal consolidation. Electronically Signed   By: Elon Alas M.D.   On: 12/27/2016 17:52    EKG: Independently reviewed.  NSR with rate 57; no  evidence of acute ischemia, NSCSLT  Assessment/Plan Principal Problem:   Chest pain Active Problems:   Essential hypertension, benign   Hyperlipidemia LDL goal <130   Hypothyroidism   Chest pain -Patient with substernal chest pain that came on at rest, resolved with ASA each time, recurrent episodes with possible escalation over the last week. -1-2/3 typical symptoms suggestive of noncardiac vs. Atypical chest pain, but the pattern is concerning for unstable angina.  -CXR unremarkable.   -Initial cardiac troponin negative.   -EKG not indicative of acute ischemia.   -TIMI risk score is 3; which predicts a 14 days risk of death, recurrent MI, or urgent revascularization of 13.2%.  -Will plan to place in observation status on telemetry to rule out ACS by overnight observation.  -cycle troponin q6h x 3 and repeat EKG in AM -Continue ASA 81 mg daily -morphine given -statin given -Risk factor stratification with FLP and HgbA1c; will also check TSH -Cardiology consultation in AM - NPO for possible stress test tomorrow -Will plan to start Heparin drip if her enzymes are positive and/or chest pain recurs  HTN -Takes low-dose enalapril monotherapy at home -It would be helpful to add beta blocker in this setting, but her HR has been 59-64 and she is unlikely to tolerate even low-dose BB at this time  HLD -Empiric Lipitor 20 mg for now -Lipids were checked in 6/17 (TC 159, HDL 63, LDL 80, TG 78) so will not repeat at this time  Hypothyroidism -Normal (borderline low) TSH but high T4 in 6/17 -Will recheck TSH and free T4 now -Continue home dose of Synthroid while pending results  DVT prophylaxis:  Lovenox - but will need Heparin drip if she has additional chest pain or increased troponin Code Status:  Full - confirmed with patient Family Communication: None present Disposition Plan:  Home once clinically improved Consults called: Cardiology in AM  Admission status: It is my clinical  opinion that referral for OBSERVATION is reasonable and necessary in this patient based on the above information provided. The aforementioned taken together are felt to place the patient at high risk for further clinical deterioration. However it is anticipated that the patient may be medically stable for discharge from the hospital within 24 to 48 hours.  Karmen Bongo MD [facility]  If 7PM-7AM, please contact night-coverage [email or web address] Password [email or web address]  12/27/2016, 8:30 PM

## 2016-12-27 NOTE — Telephone Encounter (Signed)
good

## 2016-12-28 ENCOUNTER — Observation Stay (HOSPITAL_BASED_OUTPATIENT_CLINIC_OR_DEPARTMENT_OTHER): Payer: Medicare HMO

## 2016-12-28 DIAGNOSIS — R011 Cardiac murmur, unspecified: Secondary | ICD-10-CM

## 2016-12-28 DIAGNOSIS — R0602 Shortness of breath: Secondary | ICD-10-CM

## 2016-12-28 DIAGNOSIS — R079 Chest pain, unspecified: Secondary | ICD-10-CM

## 2016-12-28 DIAGNOSIS — E039 Hypothyroidism, unspecified: Secondary | ICD-10-CM | POA: Diagnosis not present

## 2016-12-28 DIAGNOSIS — I10 Essential (primary) hypertension: Secondary | ICD-10-CM | POA: Diagnosis not present

## 2016-12-28 DIAGNOSIS — Z79899 Other long term (current) drug therapy: Secondary | ICD-10-CM | POA: Diagnosis not present

## 2016-12-28 DIAGNOSIS — Z85850 Personal history of malignant neoplasm of thyroid: Secondary | ICD-10-CM | POA: Diagnosis not present

## 2016-12-28 DIAGNOSIS — R0789 Other chest pain: Secondary | ICD-10-CM

## 2016-12-28 DIAGNOSIS — E038 Other specified hypothyroidism: Secondary | ICD-10-CM | POA: Diagnosis not present

## 2016-12-28 LAB — ECHOCARDIOGRAM COMPLETE
AV Area VTI index: 1.19 cm2/m2
AV Area mean vel: 2.12 cm2
AV Mean grad: 11 mmHg
AV area mean vel ind: 1.1 cm2/m2
AVA: 2.3 cm2
AVAREAVTI: 2.16 cm2
AVCELMEANRAT: 0.84
AVLVOTPG: 15 mmHg
AVPG: 21 mmHg
AVPKVEL: 230 cm/s
Ao pk vel: 0.85 m/s
CHL CUP AV PEAK INDEX: 1.12
CHL CUP AV VALUE AREA INDEX: 1.19
CHL CUP AV VEL: 2.3
CHL CUP STROKE VOLUME: 53 mL
DOP CAL AO MEAN VELOCITY: 152 cm/s
EERAT: 5.9
EWDT: 324 ms
FS: 32 % (ref 28–44)
Height: 67 in
IV/PV OW: 1.03
LA ID, A-P, ES: 38 mm
LA diam end sys: 38 mm
LA diam index: 1.97 cm/m2
LA vol A4C: 59.8 ml
LAVOL: 65.9 mL
LAVOLIN: 34.2 mL/m2
LV E/e' medial: 5.9
LV dias vol: 78 mL (ref 46–106)
LV e' LATERAL: 10.9 cm/s
LV sys vol index: 13 mL/m2
LV sys vol: 25 mL (ref 14–42)
LVDIAVOLIN: 41 mL/m2
LVEEAVG: 5.9
LVOT VTI: 47 cm
LVOT area: 2.54 cm2
LVOT diameter: 18 mm
LVOT peak VTI: 0.91 cm
LVOT peak vel: 196 cm/s
LVOTSV: 119 mL
Lateral S' vel: 12.6 cm/s
MV Dec: 324
MV pk A vel: 79.9 m/s
MV pk E vel: 64.3 m/s
P 1/2 time: 691 ms
PW: 13.5 mm — AB (ref 0.6–1.1)
RV TAPSE: 22.3 mm
RV sys press: 27 mmHg
Reg peak vel: 245 cm/s
Simpson's disk: 68
TDI e' lateral: 10.9
TDI e' medial: 6.2
TR max vel: 245 cm/s
VTI: 51.8 cm
WEIGHTICAEL: 2729.6 [oz_av]

## 2016-12-28 LAB — BASIC METABOLIC PANEL
ANION GAP: 9 (ref 5–15)
BUN: 19 mg/dL (ref 6–20)
CALCIUM: 8.3 mg/dL — AB (ref 8.9–10.3)
CO2: 21 mmol/L — AB (ref 22–32)
Chloride: 112 mmol/L — ABNORMAL HIGH (ref 101–111)
Creatinine, Ser: 0.63 mg/dL (ref 0.44–1.00)
Glucose, Bld: 84 mg/dL (ref 65–99)
POTASSIUM: 3.8 mmol/L (ref 3.5–5.1)
Sodium: 142 mmol/L (ref 135–145)

## 2016-12-28 LAB — CBC
HEMATOCRIT: 35.8 % — AB (ref 36.0–46.0)
HEMOGLOBIN: 12.1 g/dL (ref 12.0–15.0)
MCH: 30 pg (ref 26.0–34.0)
MCHC: 33.8 g/dL (ref 30.0–36.0)
MCV: 88.6 fL (ref 78.0–100.0)
Platelets: 193 10*3/uL (ref 150–400)
RBC: 4.04 MIL/uL (ref 3.87–5.11)
RDW: 13.9 % (ref 11.5–15.5)
WBC: 4.6 10*3/uL (ref 4.0–10.5)

## 2016-12-28 LAB — TROPONIN I: Troponin I: <0.03 ng/mL (ref <0.03)

## 2016-12-28 LAB — PROTIME-INR
INR: 0.99
PROTHROMBIN TIME: 13.1 s (ref 11.4–15.2)

## 2016-12-28 LAB — T4, FREE: Free T4: 1.48 ng/dL — ABNORMAL HIGH (ref 0.61–1.12)

## 2016-12-28 NOTE — Progress Notes (Signed)
Pt in room sleeping, call light with in reach. Nothing needed at this time.

## 2016-12-28 NOTE — Progress Notes (Signed)
Pt in room resting, call light within reach. Pt states that she has not had any tightness in her chest since coming to the floor. Nothing needed at this time.

## 2016-12-28 NOTE — Care Management Note (Signed)
Case Management Note  Patient Details  Name: [name] MRN: [id number] Date of Birth: 1946/01/17  Subjective/Objective:    Patietnt adm from home with CP. She lives alone and is ind with ADL's. No DME or HH PTA.Marland Kitchen She still drives, has PCP and reports no issues affording medications.              Action/Plan: ECHO pending. Anticipate DC home with self care.    Expected Discharge Date:   12/28/2016               Expected Discharge Plan:  Home/Self Care  In-House Referral:  NA  Discharge planning Services  CM Consult  Post Acute Care Choice:  NA Choice offered to:  NA  DME Arranged:    DME Agency:     HH Arranged:    HH Agency:     Status of Service:  In process, will continue to follow  If discussed at Long Length of Stay Meetings, dates discussed:    Additional Comments:  Lyncoln Ledgerwood, Chauncey Reading, RN 12/28/2016, 1:17 PM

## 2016-12-28 NOTE — Progress Notes (Signed)
*  PRELIMINARY RESULTS* Echocardiogram 2D Echocardiogram has been performed.  [name] 12/28/2016, 2:52 PM

## 2016-12-28 NOTE — Consult Note (Signed)
Primary cardiologist: N/A Consulting cardiologist: Dr Carlyle Dolly Requesting physician: Dr Carolin Sicks  Clinical Summary [name] is a 71 y.o.female history of HTN, hypothyroidism, HL, admitted with chest pain. She reports symptoms started about 1 week ago. Pressure in midchest 5/10 in severity with some SOB. Tend to occur at rest. Not positional. Lasts for approx 30 minutes. Roughly 5 episodes over the last week.   CAD risk factors: father CABG age 31, brother MI age 77. HTN, hyperlipidemia K 3.9, Cr 0.73, Hgb 12.9, Plt 229, TSH 0.041 Trop neg x 4 CXR no acute process EKG sinus brady no acute changes   Allergies  Allergen Reactions  . Codeine Nausea And Vomiting  . Demerol Nausea And Vomiting    Medications Scheduled Medications: . aspirin EC  81 mg Oral Daily  . atorvastatin  20 mg Oral q1800  . enalapril  5 mg Oral Daily  . enoxaparin (LOVENOX) injection  40 mg Subcutaneous Q24H  . levothyroxine  175 mcg Oral QAC breakfast  . nitroGLYCERIN  1 inch Topical Q6H     Infusions:   PRN Medications:  acetaminophen, ondansetron (ZOFRAN) IV   Past Medical History:  Diagnosis Date  . Cancer [facility])    Thyroid cancer  . Hypercholesteremia    resolved after gastric bypass  . Hypertension   . Hypothyroidism     Past Surgical History:  Procedure Laterality Date  . ABDOMINAL HYSTERECTOMY    . BILATERAL SALPINGOOPHORECTOMY    . CHOLECYSTECTOMY    . COLONOSCOPY  11/14/2011   Procedure: COLONOSCOPY;  Surgeon: Daneil Dolin, MD;  Location[facility];  Service: Endoscopy;  Laterality: N/A;  . REPLACEMENT TOTAL KNEE BILATERAL    . ROUX-EN-Y PROCEDURE  2007  . TONSILLECTOMY    . TOTAL THYROIDECTOMY  12/2010  . TUBAL LIGATION      Family History  Problem Relation Age of Onset  . Pneumonia Mother 53  . CAD Father 108  . CAD Sister 50  . CAD Brother 52  . Other Maternal Grandmother 96  . Other Brother 39    drug overdose  . CAD Other 44  . Colon cancer Neg  Hx     Social History Ms. Nguon reports that she has never smoked. She has never used smokeless tobacco. Ms. Kissling reports that she does not drink alcohol.  Review of Systems CONSTITUTIONAL: No weight loss, fever, chills, weakness or fatigue.  HEENT: Eyes: No visual loss, blurred vision, double vision or yellow sclerae. No hearing loss, sneezing, congestion, runny nose or sore throat.  SKIN: No rash or itching.  CARDIOVASCULAR: per HPI RESPIRATORY: per HPI GASTROINTESTINAL: No anorexia, nausea, vomiting or diarrhea. No abdominal pain or blood.  GENITOURINARY: no polyuria, no dysuria NEUROLOGICAL: No headache, dizziness, syncope, paralysis, ataxia, numbness or tingling in the extremities. No change in bowel or bladder control.  MUSCULOSKELETAL: No muscle, back pain, joint pain or stiffness.  HEMATOLOGIC: No anemia, bleeding or bruising.  LYMPHATICS: No enlarged nodes. No history of splenectomy.  PSYCHIATRIC: No history of depression or anxiety.      Physical Examination Blood pressure (!) 123/58, pulse (!) 56, temperature 97.9 F (36.6 C), temperature source Oral, resp. rate 18, height 5\' 7"  (1.702 m), weight 170 lb 9.6 oz (77.4 kg), SpO2 99 %.  Intake/Output Summary (Last 24 hours) at 12/28/16 1209 Last data filed at 12/28/16 0828  Gross per 24 hour  Intake  0 ml  Output              400 ml  Net             -400 ml    HEENT: sclera clear, throat clear  Cardiovascular: RRR, 3/6 systolic murmur RUSB, no jvd  Respiratory: CTAB  GI: abdomen soft, NT, Nd  MSK: no LE edema  Neuro: no focal deficits  Psych: appropriate affect   Lab Results  Basic Metabolic Panel:  Recent Labs Lab 12/27/16 1727 12/28/16 0322  NA 137 142  K 3.9 3.8  CL 109 112*  CO2 24 21*  GLUCOSE 92 84  BUN 20 19  CREATININE 0.73 0.63  CALCIUM 8.9 8.3*    Liver Function Tests: No results for input(s): AST, ALT, ALKPHOS, BILITOT, PROT, ALBUMIN in the last 168  hours.  CBC:  Recent Labs Lab 12/27/16 1727 12/28/16 0322  WBC 6.1 4.6  HGB 12.9 12.1  HCT 38.8 35.8*  MCV 89.4 88.6  PLT 229 193    Cardiac Enzymes:  Recent Labs Lab 12/27/16 1727 12/27/16 2136 12/28/16 0322 12/28/16 0955  TROPONINI <0.03 <0.03 <0.03 <0.03    BNP: Invalid input(s): POCBNP    Impression/Recommendations 1. Chest pain - no evidence of ACS by enxymes or EKG - based on symptoms and risk factors plan for lexiscan stress tomorrow - echo today  2. Heart murmur - obtain echo today to further evaluate.   Carlyle Dolly, M.D.

## 2016-12-28 NOTE — Progress Notes (Signed)
PROGRESS NOTE    [name]  [id number] DOB: [birth year] DOA: 12/27/2016 PCP: Mickie Hillier, MD   Brief Narrative: 71 y.o. female with medical history significant of HTN, hypothyroidism, remote thyroid cancer, and h/o HLD prior to gastric bypass presenting with chest pain.  Her dad died at 10 during heart surgery and her brother died at 69 from a heart attack. Patient has on and off chest pain, nonexertional about 5 episodes.  Assessment & Plan:  #Chest pain: Substernal. Enzymes not elevated and EKG with no ischemic changes. Evaluated by cardiologist. Discussed with Dr. Harl Bowie. Plan for echocardiogram today and cardiac stress test tomorrow morning. Patient did have chest pain today. Continue aspirin, Lipitor.  #Hypothyroidism: TSH is low. Check free T4. I will also add free T3. Continue Synthroid. Recommended to follow up with PCP.  #Hypertension: Continue enalapril.   Principal Problem:   Chest pain Active Problems:   Essential hypertension, benign   Hyperlipidemia LDL goal <130   Hypothyroidism   DVT prophylaxis: Lovenox subcutaneous Code Status: Full code Family Communication: No family present at bedside Disposition Plan: Likely discharge home tomorrow    Consultants:   Cardiologist  Procedures: Plan for echo and cardiac stress test Antimicrobials: None  Subjective: Patient was seen and examined at bedside. Patient reported feeling better today. Denied headache, dizziness, nausea, vomiting, chest pain, shortness of breath, palpitation. No fever or chills.   Objective: Vitals:   12/27/16 1930 12/27/16 2000 12/27/16 2100 12/28/16 0432  BP: 124/72 147/74 121/87 (!) 123/58  Pulse: (!) 59 64 65 (!) 56  Resp: 18 14 16 18   Temp:   98.5 F (36.9 C) 97.9 F (36.6 C)  TempSrc:   Oral Oral  SpO2: 100% 100% 99% 99%  Weight:   77.4 kg (170 lb 9.6 oz)   Height:   5\' 7"  (1.702 m)     Intake/Output Summary (Last 24 hours) at 12/28/16 1356 Last data filed at  12/28/16 0828  Gross per 24 hour  Intake                0 ml  Output              400 ml  Net             -400 ml   Filed Weights   12/27/16 1717 12/27/16 2100  Weight: 78 kg (172 lb) 77.4 kg (170 lb 9.6 oz)    Examination:  General exam: Appears calm and comfortable  Respiratory system: Clear to auscultation. Respiratory effort normal. No wheezing or crackle Cardiovascular system: S1 & S2 heard, RRR.  No pedal edema. Gastrointestinal system: Abdomen is nondistended, soft and nontender. Normal bowel sounds heard. Central nervous system: Alert and oriented. No focal neurological deficits. Extremities: Symmetric 5 x 5 power. Skin: No rashes, lesions or ulcers Psychiatry: Judgement and insight appear normal. Mood & affect appropriate.     Data Reviewed: I have personally reviewed following labs and imaging studies  CBC:  Recent Labs Lab 12/27/16 1727 12/28/16 0322  WBC 6.1 4.6  HGB 12.9 12.1  HCT 38.8 35.8*  MCV 89.4 88.6  PLT 229 0000000   Basic Metabolic Panel:  Recent Labs Lab 12/27/16 1727 12/28/16 0322  NA 137 142  K 3.9 3.8  CL 109 112*  CO2 24 21*  GLUCOSE 92 84  BUN 20 19  CREATININE 0.73 0.63  CALCIUM 8.9 8.3*   GFR: Estimated Creatinine Clearance: 69.1 mL/min (by C-G formula based on SCr of 0.63  mg/dL). Liver Function Tests: No results for input(s): AST, ALT, ALKPHOS, BILITOT, PROT, ALBUMIN in the last 168 hours. No results for input(s): LIPASE, AMYLASE in the last 168 hours. No results for input(s): AMMONIA in the last 168 hours. Coagulation Profile:  Recent Labs Lab 12/28/16 0322  INR 0.99   Cardiac Enzymes:  Recent Labs Lab 12/27/16 1727 12/27/16 2136 12/28/16 0322 12/28/16 0955  TROPONINI <0.03 <0.03 <0.03 <0.03   BNP (last 3 results) No results for input(s): PROBNP in the last 8760 hours. HbA1C: No results for input(s): HGBA1C in the last 72 hours. CBG: No results for input(s): GLUCAP in the last 168 hours. Lipid Profile: No  results for input(s): CHOL, HDL, LDLCALC, TRIG, CHOLHDL, LDLDIRECT in the last 72 hours. Thyroid Function Tests:  Recent Labs  12/27/16 1727  TSH 0.041*   Anemia Panel: No results for input(s): VITAMINB12, FOLATE, FERRITIN, TIBC, IRON, RETICCTPCT in the last 72 hours. Sepsis Labs: No results for input(s): PROCALCITON, LATICACIDVEN in the last 168 hours.  No results found for this or any previous visit (from the past 240 hour(s)).       Radiology Studies: Dg Chest 2 View  Result Date: 12/27/2016 CLINICAL DATA:  Generalized chest pain and tightness for 1 week. History of hypertension, thyroid cancer. EXAM: CHEST  2 VIEW COMPARISON:  Chest radiograph December 24, 2010 FINDINGS: Cardiomediastinal silhouette is normal. No pleural effusions or focal consolidations. Mild hyperinflation. Trachea projects midline and there is no pneumothorax. Soft tissue planes and included osseous structures are non-suspicious. Surgical clips in the included right abdomen compatible with cholecystectomy. Osteopenia with mild degenerative change of the thoracic spine. Surgical clips at thoracic inlet compatible with thyroidectomy. IMPRESSION: Hyperinflation, no focal consolidation. Electronically Signed   By: Elon Alas M.D.   On: 12/27/2016 17:52        Scheduled Meds: . aspirin EC  81 mg Oral Daily  . atorvastatin  20 mg Oral q1800  . enalapril  5 mg Oral Daily  . enoxaparin (LOVENOX) injection  40 mg Subcutaneous Q24H  . levothyroxine  175 mcg Oral QAC breakfast  . nitroGLYCERIN  1 inch Topical Q6H   Continuous Infusions:   LOS: 0 days    Dron Tanna Furry, MD [facility] Pager [phone]  If 7PM-7AM, please contact night-coverage [email or web address] Password [email or web address] 12/28/2016, 1:56 PM

## 2016-12-28 NOTE — Care Management Obs Status (Signed)
[place] NOTIFICATION   Patient Details  Name: TWALLA RAPPOLD MRN: [id number] Date of Birth: 10-14-1946   Medicare Observation Status Notification Given:  Yes    Earvin Blazier, Chauncey Reading, RN 12/28/2016, 1:19 PM

## 2016-12-29 ENCOUNTER — Observation Stay (HOSPITAL_BASED_OUTPATIENT_CLINIC_OR_DEPARTMENT_OTHER): Payer: Medicare HMO

## 2016-12-29 DIAGNOSIS — Z85850 Personal history of malignant neoplasm of thyroid: Secondary | ICD-10-CM | POA: Diagnosis not present

## 2016-12-29 DIAGNOSIS — R079 Chest pain, unspecified: Secondary | ICD-10-CM

## 2016-12-29 DIAGNOSIS — R0789 Other chest pain: Secondary | ICD-10-CM | POA: Diagnosis not present

## 2016-12-29 DIAGNOSIS — I10 Essential (primary) hypertension: Secondary | ICD-10-CM | POA: Diagnosis not present

## 2016-12-29 DIAGNOSIS — R0602 Shortness of breath: Secondary | ICD-10-CM | POA: Diagnosis not present

## 2016-12-29 DIAGNOSIS — E038 Other specified hypothyroidism: Secondary | ICD-10-CM | POA: Diagnosis not present

## 2016-12-29 DIAGNOSIS — E039 Hypothyroidism, unspecified: Secondary | ICD-10-CM | POA: Diagnosis not present

## 2016-12-29 DIAGNOSIS — Z79899 Other long term (current) drug therapy: Secondary | ICD-10-CM | POA: Diagnosis not present

## 2016-12-29 LAB — NM MYOCAR MULTI W/SPECT W/WALL MOTION / EF
CHL CUP NUCLEAR SRS: 7
CHL CUP NUCLEAR SSS: 10
LHR: 0.31
LV dias vol: 73 mL (ref 46–106)
LV sys vol: 34 mL
NUC STRESS TID: 1.31
Peak HR: 91 {beats}/min
Rest HR: 56 {beats}/min
SDS: 3

## 2016-12-29 MED ORDER — LEVOTHYROXINE SODIUM 150 MCG PO TABS: 150.0000 ug | ORAL_TABLET | Freq: Every day | ORAL | 0 refills | Status: AC

## 2016-12-29 MED ORDER — TECHNETIUM TC 99M TETROFOSMIN IV KIT
30.0000 | PACK | Freq: Once | INTRAVENOUS | Status: AC | PRN
  Administered 2016-12-29: 30 via INTRAVENOUS

## 2016-12-29 MED ORDER — ASPIRIN 81 MG PO TBEC
81.0000 mg | DELAYED_RELEASE_TABLET | Freq: Every day | ORAL | 0 refills | Status: DC
Start: 2016-12-30 — End: 2017-06-28

## 2016-12-29 MED ORDER — TECHNETIUM TC 99M TETROFOSMIN IV KIT
10.0000 | PACK | Freq: Once | INTRAVENOUS | Status: AC | PRN
  Administered 2016-12-29: 9.6 via INTRAVENOUS

## 2016-12-29 MED ORDER — LEVOTHYROXINE SODIUM 75 MCG PO TABS: 150.0000 ug | ORAL_TABLET | Freq: Every day | ORAL | Status: DC

## 2016-12-29 MED ORDER — REGADENOSON 0.4 MG/5ML IV SOLN
INTRAVENOUS | Status: AC
  Administered 2016-12-29: 0.4 mg via INTRAVENOUS
  Filled 2016-12-29: qty 5

## 2016-12-29 MED ORDER — SODIUM CHLORIDE 0.9% FLUSH
INTRAVENOUS | Status: AC
  Administered 2016-12-29: 10 mL via INTRAVENOUS
  Filled 2016-12-29: qty 10

## 2016-12-29 NOTE — Discharge Summary (Signed)
Physician Discharge Summary  [name] [id number] DOB: 05-10-1946 DOA: 12/27/2016  PCP: Mickie Hillier, MD  Admit date: 12/27/2016 Discharge date: 12/29/2016  Admitted From: Home Disposition: Home  Recommendations for Outpatient Follow-up:  1. Follow up with PCP in 1-2 weeks 2. Please Check thyroid function test with your PCP in 4-6 weeks. 3. Please follow up with your cardiologist.   Home Health:no Equipment/Devices:No Discharge Condition: Stable CODE STATUS: Full code Diet recommendation: Heart healthy  Brief/Interim Summary:70 y.o.femalewith medical history significant of HTN, hypothyroidism, remote thyroid cancer, and h/o HLD prior to gastric bypass presenting with chest pain. Her dad died at 61 during heart surgery and her brother died at 80 from a heart attack. Patient has on and off chest pain, nonexertional about 5 episodes.  # Chest pain: Substernal. Enzymes not elevated and EKG with no ischemic changes. Evaluated by cardiologist. Echocardiogram done. Cardiac stress test with no ischemia. Discussed with Dr. Harl Bowie from cardiologist recommended to discharge patient with outpatient follow-up. He will arrange for outpatient follow-up. Patient has no chest pain.  #Hypothyroidism: Thyroid dose reduced because of low TSH and elevated free T4. I advised patient to repeat labs with PCP in 4-6 weeks. She verbalized understanding.  #Hypertension: Continue enalapril.  Discharge Diagnoses:  Principal Problem:   Chest pain Active Problems:   Essential hypertension, benign   Hyperlipidemia LDL goal <130   Hypothyroidism    Discharge Instructions  Discharge Instructions    Call MD for:  difficulty breathing, headache or visual disturbances    Complete by:  As directed    Call MD for:  extreme fatigue    Complete by:  As directed    Call MD for:  hives    Complete by:  As directed    Call MD for:  persistant dizziness or light-headedness    Complete by:  As  directed    Call MD for:  persistant nausea and vomiting    Complete by:  As directed    Call MD for:  severe uncontrolled pain    Complete by:  As directed    Call MD for:  temperature >100.4    Complete by:  As directed    Diet - low sodium heart healthy    Complete by:  As directed    Increase activity slowly    Complete by:  As directed      Allergies as of 12/29/2016      Reactions   Codeine Nausea And Vomiting   Demerol Nausea And Vomiting      Medication List    TAKE these medications   aspirin 81 MG EC tablet Take 1 tablet (81 mg total) by mouth daily. Start taking on:  12/30/2016   calcium citrate 950 MG tablet Commonly known as:  CALCITRATE - dosed in mg elemental calcium Take 1 tablet by mouth 2 (two) times daily.   enalapril 5 MG tablet Commonly known as:  VASOTEC TAKE 1 TABLET BY MOUTH  DAILY AT NIGHT   ferrous sulfate 325 (65 FE) MG tablet Take 325 mg by mouth daily with breakfast.   levothyroxine 150 MCG tablet Commonly known as:  SYNTHROID Take 1 tablet (150 mcg total) by mouth daily before breakfast. What changed:  medication strength  how much to take   multivitamins ther. w/minerals Tabs tablet Take 1 tablet by mouth 2 (two) times daily.   valACYclovir 1000 MG tablet Commonly known as:  VALTREX TAKE 2 TABLETS AT FIRST SIGN OF FEVER BLISTER, THEN 2  TABLETS 2 HOURSLATER   vitamin B-12 500 MCG tablet Commonly known as:  CYANOCOBALAMIN Take 2,500 mcg by mouth every morning.   Vitamin D-3 5000 units Tabs Take 1 tablet by mouth every morning.      Follow-up Information    Mickie Hillier, MD. Schedule an appointment as soon as possible for a visit in 1 week(s).   Specialty:  Family Medicine Contact information: [place] [address] [phone]        Carlyle Dolly, MD. Schedule an appointment as soon as possible for a visit in 1 month(s).   Specialty:  Cardiology Contact information: [place] [address] [phone]          Allergies  Allergen Reactions  . Codeine Nausea And Vomiting  . Demerol Nausea And Vomiting    Consultations: Cardiologist  Procedures/Studies: Echo and cardiac stress test  Subjective: Patient was seen and examined at bedside. Reported and doing well. Denied fever, chills, headache, dizziness, chest pain, shortness of breath.   Discharge Exam: Vitals:   12/28/16 2100 12/29/16 0533  BP: 110/67 (!) 106/59  Pulse: 66 60  Resp: 18 18  Temp: 98.4 F (36.9 C) 97.7 F (36.5 C)   Vitals:   12/28/16 0432 12/28/16 1500 12/28/16 2100 12/29/16 0533  BP: (!) 123/58 122/62 110/67 (!) 106/59  Pulse: (!) 56 67 66 60  Resp: 18 18 18 18   Temp: 97.9 F (36.6 C) 98.5 F (36.9 C) 98.4 F (36.9 C) 97.7 F (36.5 C)  TempSrc: Oral Oral Oral Oral  SpO2: 99% 99% 99% 95%  Weight:      Height:        General: Pt is alert, awake, not in acute distress Cardiovascular: RRR, S1/S2 +, no rubs, no gallops Respiratory: CTA bilaterally, no wheezing, no rhonchi Abdominal: Soft, NT, ND, bowel sounds + Extremities: no edema, no cyanosis    The results of significant diagnostics from this hospitalization (including imaging, microbiology, ancillary and laboratory) are listed below for reference.     Microbiology: No results found for this or any previous visit (from the past 240 hour(s)).   Labs: BNP (last 3 results) No results for input(s): BNP in the last 8760 hours. Basic Metabolic Panel:  Recent Labs Lab 12/27/16 1727 12/28/16 0322  NA 137 142  K 3.9 3.8  CL 109 112*  CO2 24 21*  GLUCOSE 92 84  BUN 20 19  CREATININE 0.73 0.63  CALCIUM 8.9 8.3*   Liver Function Tests: No results for input(s): AST, ALT, ALKPHOS, BILITOT, PROT, ALBUMIN in the last 168 hours. No results for input(s): LIPASE, AMYLASE in the last 168 hours. No results for input(s): AMMONIA in the last 168 hours. CBC:  Recent Labs Lab 12/27/16 1727  12/28/16 0322  WBC 6.1 4.6  HGB 12.9 12.1  HCT 38.8 35.8*  MCV 89.4 88.6  PLT 229 193   Cardiac Enzymes:  Recent Labs Lab 12/27/16 1727 12/27/16 2136 12/28/16 0322 12/28/16 0955  TROPONINI <0.03 <0.03 <0.03 <0.03   BNP: Invalid input(s): POCBNP CBG: No results for input(s): GLUCAP in the last 168 hours. D-Dimer No results for input(s): DDIMER in the last 72 hours. Hgb A1c No results for input(s): HGBA1C in the last 72 hours. Lipid Profile No results for input(s): CHOL, HDL, LDLCALC, TRIG, CHOLHDL, LDLDIRECT in the last 72 hours. Thyroid function studies  Recent Labs  12/27/16 1727  TSH 0.041*   Anemia work up No results for input(s): VITAMINB12,  FOLATE, FERRITIN, TIBC, IRON, RETICCTPCT in the last 72 hours. Urinalysis    Component Value Date/Time   COLORURINE YELLOW 12/24/2010 0945   APPEARANCEUR CLOUDY (A) 12/24/2010 0945   LABSPEC 1.018 12/24/2010 0945   PHURINE 5.5 12/24/2010 0945   HGBUR NEGATIVE 12/24/2010 0945   BILIRUBINUR NEGATIVE 12/24/2010 0945   KETONESUR NEGATIVE 12/24/2010 0945   PROTEINUR NEGATIVE 12/24/2010 0945   UROBILINOGEN 0.2 12/24/2010 0945   NITRITE NEGATIVE 12/24/2010 0945   LEUKOCYTESUR  12/24/2010 0945    NEGATIVE MICROSCOPIC NOT DONE ON URINES WITH NEGATIVE PROTEIN, BLOOD, LEUKOCYTES, NITRITE, OR GLUCOSE <1000 mg/dL.   Sepsis Labs Invalid input(s): PROCALCITONIN,  WBC,  LACTICIDVEN Microbiology No results found for this or any previous visit (from the past 240 hour(s)).   Time coordinating discharge: Over 30 minutes  SIGNED:   Rosita Fire, MD  [facility] 12/29/2016, 1:05 PM  If 7PM-7AM, please contact night-coverage [email or web address] Password [email or web address]

## 2016-12-29 NOTE — Progress Notes (Signed)
Pt discharged home today per Dr. Carolin Sicks.  Pt's IV site D/C'd and WDL.  Pt's VSS.  Pt provided with home medication list, discharge instructions and prescriptions.  Verbalized understanding.  Pt left floor via WC in stable condition accompanied by NT.

## 2016-12-30 ENCOUNTER — Telehealth: Payer: Self-pay | Admitting: *Deleted

## 2016-12-30 LAB — T3, FREE: T3, Free: 2.6 pg/mL (ref 2.0–4.4)

## 2016-12-30 NOTE — Telephone Encounter (Signed)
Per Dr Richardson Landry:  Call pt and see how she is doing, sched appt for next week for f /u  Spoke with patient. Patient states she is doing well and scheduled follow up office visit for next week to follow up with Dr Richardson Landry.

## 2016-12-30 NOTE — Telephone Encounter (Signed)
thx

## 2017-01-05 ENCOUNTER — Encounter: Payer: Self-pay | Admitting: Family Medicine

## 2017-01-05 ENCOUNTER — Ambulatory Visit (INDEPENDENT_AMBULATORY_CARE_PROVIDER_SITE_OTHER): Payer: Medicare HMO | Admitting: Family Medicine

## 2017-01-05 VITALS — BP 128/74 | Ht 67.0 in | Wt 172.0 lb

## 2017-01-05 DIAGNOSIS — K219 Gastro-esophageal reflux disease without esophagitis: Secondary | ICD-10-CM

## 2017-01-05 MED ORDER — RANITIDINE HCL 300 MG PO CAPS: 300.0000 mg | ORAL_CAPSULE | Freq: Every evening | ORAL | 1 refills | Status: DC

## 2017-01-05 MED ORDER — OMEPRAZOLE 20 MG PO CPDR: 20.0000 mg | DELAYED_RELEASE_CAPSULE | Freq: Every day | ORAL | 0 refills | Status: DC

## 2017-01-05 NOTE — Progress Notes (Signed)
   Subjective:    Patient ID: [name], female    DOB: 04-02-1946, 71 y.o.   MRN: [id number] Patient was appropriately contacted via phone soon after discharge. She was compliant with medications. No acute complaints. This appointment was encouraged and scheduled.  Entire hospital record today is reviewed in presence of patient.   HPIFollow up hospitalization for chest pain.  Overall no substantial pain at this time. Has rare twinges in the chest. However on further history does develop pressure. Usually needing. Usually after meals. Patient does have history of gastric bypass surgery.  In the hospital she had a negative echocardiogram and stress test workup and the [facility] cardiologist felt extremely low risk of cardiac etiology to her chest symptoms next  TSH was often thyroid was adjusted. Pt states no concerns today.     Review of Systems No headache, no major weight loss or weight gain, no chest pain no back pain abdominal pain no change in bowel habits complete ROS otherwise negative     Objective:   Physical Exam Alert vitals stable, NAD. Blood pressure good on repeat. HEENT normal. Lungs clear. Heart regular rate and rhythm.  Abdomen benign      Assessment & Plan:  Impression post prandial chest pressure. Likely reflux in nature. Particularly with negative cardiac workup discussed at length #2 hypothyroidism thyroid adjusted due to be followed by endocrinologist plan add proton pump inhibitor. Rationale discussed symptom care discussed follow-up as scheduled

## 2017-01-13 ENCOUNTER — Encounter: Payer: Self-pay | Admitting: Adult Health

## 2017-01-13 ENCOUNTER — Ambulatory Visit (INDEPENDENT_AMBULATORY_CARE_PROVIDER_SITE_OTHER): Payer: Medicare HMO | Admitting: Adult Health

## 2017-01-13 VITALS — BP 138/84 | HR 77 | Ht 67.0 in | Wt 174.0 lb

## 2017-01-13 DIAGNOSIS — R0789 Other chest pain: Secondary | ICD-10-CM | POA: Diagnosis not present

## 2017-01-13 DIAGNOSIS — I10 Essential (primary) hypertension: Secondary | ICD-10-CM | POA: Diagnosis not present

## 2017-01-13 NOTE — Patient Instructions (Signed)
Your physician recommends that you continue on your current medications as directed. Please refer to the Current Medication list given to you today.   Your physician recommends that you schedule a follow-up appointment in: PRN  Thanks for choosing [facility]!!!

## 2017-01-13 NOTE — Progress Notes (Signed)
Cardiology Office Note   Date:  01/13/2017   ID:  Cylinda, Neilsen 1946/04/23, MRN [id number]  PCP:  Mickie Hillier, MD  Cardiologist: Cloria Spring, NP   No chief complaint on file.     History of Present Illness: [name] is a 71 y.o. female who presents for post hospital follow up after being seen on consultation for chest pain. She has a history of hypertension, hyperlipidemia, and hypothyroidism. She underwent a Lexiscan stress test on 12/29/2016.    There was no ST segment deviation noted during stress.  The study is normal. Inferior defect most consistent with subdiaphragmatic attenuation and gut radiotracer uptake artifact as opposed to true inferior wall ischemia.  This is a low risk study.  The left ventricular ejection fraction is normal (55-65%).  Echocardiogram 12/28/2016 Left ventricle: The cavity size was normal. Wall thickness was   increased in a pattern of mild LVH. Systolic function was   vigorous. The estimated ejection fraction was in the range of 65%   to 70%. Doppler parameters are consistent with abnormal left   ventricular relaxation (grade 1 diastolic dysfunction). - Aortic valve: Mildly calcified annulus. Trileaflet; mildly   thickened leaflets. There was mild regurgitation. Valve area   (VTI): 2.3 cm^2. Valve area (Vmax): 2.16 cm^2. Valve area   (Vmean): 2.12 cm^2. - Left atrium: The atrium was mildly dilated. - Technically adequate study.  She is here today without any further complaints. Primary care is placed her on a PPI, and is going to transition her to an H2 blocker.  Past Medical History:  Diagnosis Date  . Cancer [facility])    Thyroid cancer  . Hypercholesteremia    resolved after gastric bypass  . Hypertension   . Hypothyroidism     Past Surgical History:  Procedure Laterality Date  . ABDOMINAL HYSTERECTOMY    . BILATERAL SALPINGOOPHORECTOMY    . CHOLECYSTECTOMY    . COLONOSCOPY  11/14/2011   Procedure:  COLONOSCOPY;  Surgeon: Daneil Dolin, MD;  Location: [facility] SUITE;  Service: Endoscopy;  Laterality: N/A;  . REPLACEMENT TOTAL KNEE BILATERAL    . ROUX-EN-Y PROCEDURE  2007  . TONSILLECTOMY    . TOTAL THYROIDECTOMY  12/2010  . TUBAL LIGATION       Current Outpatient Prescriptions  Medication Sig Dispense Refill  . aspirin EC 81 MG EC tablet Take 1 tablet (81 mg total) by mouth daily. 30 tablet 0  . calcium citrate (CALCITRATE - DOSED IN MG ELEMENTAL CALCIUM) 950 MG tablet Take 1 tablet by mouth 2 (two) times daily.     . Cholecalciferol (VITAMIN D-3) 5000 UNITS TABS Take 1 tablet by mouth every morning.     . enalapril (VASOTEC) 5 MG tablet TAKE 1 TABLET BY MOUTH  DAILY AT NIGHT 90 tablet 1  . ferrous sulfate 325 (65 FE) MG tablet Take 325 mg by mouth daily with breakfast.    . FOLIC ACID PO Take by mouth.    . levothyroxine (SYNTHROID) 150 MCG tablet Take 1 tablet (150 mcg total) by mouth daily before breakfast. 30 tablet 0  . Multiple Vitamins-Minerals (MULTIVITAMINS THER. W/MINERALS) TABS Take 1 tablet by mouth 2 (two) times daily.      Marland Kitchen omeprazole (PRILOSEC) 20 MG capsule Take 1 capsule (20 mg total) by mouth daily. 30 capsule 0  . ranitidine (ZANTAC) 300 MG capsule Take 1 capsule (300 mg total) by mouth every evening. 90 capsule 1  . valACYclovir (VALTREX) 1000 MG  tablet TAKE 2 TABLETS AT FIRST SIGN OF FEVER BLISTER, THEN 2 TABLETS 2 HOURSLATER 4 tablet 0  . vitamin B-12 (CYANOCOBALAMIN) 500 MCG tablet Take 2,500 mcg by mouth every morning.      No current facility-administered medications for this visit.     Allergies:   Codeine and Demerol    Social History:  The patient  reports that she has never smoked. She has never used smokeless tobacco. She reports that she does not drink alcohol or use drugs.   Family History:  The patient's family history includes CAD (age of onset: 45) in her other; CAD (age of onset: 8) in her brother; CAD (age of onset: 54) in her father; CAD  (age of onset: 103) in her sister; Other (age of onset: 32) in her brother; Other (age of onset: 61) in her maternal grandmother; Pneumonia (age of onset: 42) in her mother.    ROS: All other systems are reviewed and negative. Unless otherwise mentioned in H&P    PHYSICAL EXAM: VS:  BP 138/84   Pulse 77   Ht 5\' 7"  (1.702 m)   Wt 174 lb (78.9 kg)   SpO2 99%   BMI 27.25 kg/m  , BMI Body mass index is 27.25 kg/m. GEN: Well nourished, well developed, in no acute distress  HEENT: normal  Neck: no JVD, carotid bruits, or masses Cardiac:RRR; no murmurs, rubs, or gallops,no edema  Respiratory:  clear to auscultation bilaterally, normal work of breathing GI: soft, nontender, nondistended, + BS MS: no deformity or atrophy  Skin: warm and dry, no rash Neuro:  Strength and sensation are intact Psych: euthymic mood, full affect  Recent Labs: 05/30/2016: ALT 18 12/27/2016: TSH 0.041 12/28/2016: BUN 19; Creatinine, Ser 0.63; Hemoglobin 12.1; Platelets 193; Potassium 3.8; Sodium 142    Lipid Panel    Component Value Date/Time   CHOL 159 05/30/2016 1033   TRIG 78 05/30/2016 1033   HDL 63 05/30/2016 1033   CHOLHDL 2.5 05/30/2016 1033   CHOLHDL 2.9 09/10/2013 1016   VLDL 15 09/10/2013 1016   LDLCALC 80 05/30/2016 1033      Wt Readings from Last 3 Encounters:  01/13/17 174 lb (78.9 kg)  01/05/17 172 lb (78 kg)  12/27/16 170 lb 9.6 oz (77.4 kg)     ASSESSMENT AND PLAN:  1.  Chest pain: Ruled out for cardiac etiology with normal echocardiogram and normal stress test. No further cardiac recommendations  2. Hypertension: Well-controlled on ACE inhibitor to be managed by primary care.  3. GERD: Continue PPI and H2 blocker with management per PCP and referral to GI if clinically warranted   Current medicines are reviewed at length with the patient today.    Labs/ tests ordered today include:  No orders of the defined types were placed in this encounter.    Disposition:   FU with  when necessary  Signed, Jory Sims, NP  01/13/2017 1:07 PM    [facility] [address] [place], [place] [address] Phone: [phone]; Fax: [phone]

## 2017-01-13 NOTE — Progress Notes (Signed)
Name: [name]    DOB: 11/20/1946  Age: 71 y.o.  MR#: [id number]       PCP:  Mickie Hillier, MD      Insurance: Payor: [employer] MEDICARE / Plan: [employer] MEDICARE HMO/PPO / Product Type: *No Product type* /   CC:   No chief complaint on file.   VS Vitals:   01/13/17 1306  BP: 138/84  Pulse: 77  SpO2: 99%  Weight: 174 lb (78.9 kg)  Height: 5\' 7"  (1.702 m)    Weights Current Weight  01/13/17 174 lb (78.9 kg)  01/05/17 172 lb (78 kg)  12/27/16 170 lb 9.6 oz (77.4 kg)    Blood Pressure  BP Readings from Last 3 Encounters:  01/13/17 138/84  01/05/17 128/74  12/29/16 (!) 106/59     Admit date:  (Not on file) Last encounter with RMR:  Visit date not found   Allergy Codeine and Demerol  Current Outpatient Prescriptions  Medication Sig Dispense Refill  . aspirin EC 81 MG EC tablet Take 1 tablet (81 mg total) by mouth daily. 30 tablet 0  . calcium citrate (CALCITRATE - DOSED IN MG ELEMENTAL CALCIUM) 950 MG tablet Take 1 tablet by mouth 2 (two) times daily.     . Cholecalciferol (VITAMIN D-3) 5000 UNITS TABS Take 1 tablet by mouth every morning.     . enalapril (VASOTEC) 5 MG tablet TAKE 1 TABLET BY MOUTH  DAILY AT NIGHT 90 tablet 1  . ferrous sulfate 325 (65 FE) MG tablet Take 325 mg by mouth daily with breakfast.    . FOLIC ACID PO Take by mouth.    . levothyroxine (SYNTHROID) 150 MCG tablet Take 1 tablet (150 mcg total) by mouth daily before breakfast. 30 tablet 0  . Multiple Vitamins-Minerals (MULTIVITAMINS THER. W/MINERALS) TABS Take 1 tablet by mouth 2 (two) times daily.      Marland Kitchen omeprazole (PRILOSEC) 20 MG capsule Take 1 capsule (20 mg total) by mouth daily. 30 capsule 0  . ranitidine (ZANTAC) 300 MG capsule Take 1 capsule (300 mg total) by mouth every evening. 90 capsule 1  . valACYclovir (VALTREX) 1000 MG tablet TAKE 2 TABLETS AT FIRST SIGN OF FEVER BLISTER, THEN 2 TABLETS 2 HOURSLATER 4 tablet 0  . vitamin B-12 (CYANOCOBALAMIN) 500 MCG tablet Take 2,500 mcg by mouth every  morning.      No current facility-administered medications for this visit.     Discontinued Meds:   There are no discontinued medications.  Patient Active Problem List   Diagnosis Date Noted  . Chest pain 12/27/2016  . Hypothyroidism 05/22/2016  . Essential hypertension, benign 09/09/2013  . Hyperlipidemia LDL goal <130 09/09/2013  . Esophageal reflux 09/09/2013    LABS    Component Value Date/Time   NA 142 12/28/2016 0322   NA 137 12/27/2016 1727   NA 141 05/30/2016 1033   NA 142 04/01/2015 0956   NA 142 09/10/2013 1016   K 3.8 12/28/2016 0322   K 3.9 12/27/2016 1727   K 4.3 05/30/2016 1033   CL 112 (H) 12/28/2016 0322   CL 109 12/27/2016 1727   CL 107 (H) 05/30/2016 1033   CO2 21 (L) 12/28/2016 0322   CO2 24 12/27/2016 1727   CO2 21 05/30/2016 1033   GLUCOSE 84 12/28/2016 0322   GLUCOSE 92 12/27/2016 1727   GLUCOSE 87 05/30/2016 1033   GLUCOSE 81 04/01/2015 0956   GLUCOSE 86 09/10/2013 1016   BUN 19 12/28/2016 0322   BUN  20 12/27/2016 1727   BUN 18 05/30/2016 1033   BUN 17 04/01/2015 0956   BUN 15 09/10/2013 1016   CREATININE 0.63 12/28/2016 0322   CREATININE 0.73 12/27/2016 1727   CREATININE 0.79 05/30/2016 1033   CREATININE 0.73 09/10/2013 1016   CALCIUM 8.3 (L) 12/28/2016 0322   CALCIUM 8.9 12/27/2016 1727   CALCIUM 8.6 (L) 05/30/2016 1033   GFRNONAA >60 12/28/2016 0322   GFRNONAA >60 12/27/2016 1727   GFRNONAA 76 05/30/2016 1033   GFRAA >60 12/28/2016 0322   GFRAA >60 12/27/2016 1727   GFRAA 88 05/30/2016 1033   CMP     Component Value Date/Time   NA 142 12/28/2016 0322   NA 141 05/30/2016 1033   K 3.8 12/28/2016 0322   CL 112 (H) 12/28/2016 0322   CO2 21 (L) 12/28/2016 0322   GLUCOSE 84 12/28/2016 0322   BUN 19 12/28/2016 0322   BUN 18 05/30/2016 1033   CREATININE 0.63 12/28/2016 0322   CREATININE 0.73 09/10/2013 1016   CALCIUM 8.3 (L) 12/28/2016 0322   PROT 6.0 05/30/2016 1033   ALBUMIN 3.6 05/30/2016 1033   AST 15 05/30/2016 1033    ALT 18 05/30/2016 1033   ALKPHOS 67 05/30/2016 1033   BILITOT 0.6 05/30/2016 1033   GFRNONAA >60 12/28/2016 0322   GFRAA >60 12/28/2016 0322       Component Value Date/Time   WBC 4.6 12/28/2016 0322   WBC 6.1 12/27/2016 1727   WBC 5.9 05/30/2016 1033   WBC 5.3 04/01/2015 0956   WBC 4.9 12/24/2010 1035   HGB 12.1 12/28/2016 0322   HGB 12.9 12/27/2016 1727   HGB 10.9 (L) 04/01/2015 0956   HCT 35.8 (L) 12/28/2016 0322   HCT 38.8 12/27/2016 1727   HCT 39.2 05/30/2016 1033   HCT 33.1 (L) 04/01/2015 0956   MCV 88.6 12/28/2016 0322   MCV 89.4 12/27/2016 1727   MCV 86 05/30/2016 1033   MCV 82 04/01/2015 0956    Lipid Panel     Component Value Date/Time   CHOL 159 05/30/2016 1033   TRIG 78 05/30/2016 1033   HDL 63 05/30/2016 1033   CHOLHDL 2.5 05/30/2016 1033   CHOLHDL 2.9 09/10/2013 1016   VLDL 15 09/10/2013 1016   LDLCALC 80 05/30/2016 1033    ABG No results found for: PHART, PCO2ART, PO2ART, HCO3, TCO2, ACIDBASEDEF, O2SAT   Lab Results  Component Value Date   TSH 0.041 (L) 12/27/2016   BNP (last 3 results) No results for input(s): BNP in the last 8760 hours.  ProBNP (last 3 results) No results for input(s): PROBNP in the last 8760 hours.  Cardiac Panel (last 3 results) No results for input(s): CKTOTAL, CKMB, TROPONINI, RELINDX in the last 72 hours.  Iron/TIBC/Ferritin/ %Sat    Component Value Date/Time   FERRITIN 55 05/30/2016 1033     EKG Orders placed or performed during the hospital encounter of 12/27/16  . EKG 12-Lead  . EKG 12-Lead  . EKG 12-Lead  . EKG 12-Lead  . EKG 12-Lead  . EKG 12-Lead  . EKG 12-Lead     Prior Assessment and Plan Problem List as of 01/13/2017 Reviewed: 12/27/2016  8:30 PM by Karmen Bongo, MD     Cardiovascular and Mediastinum   Essential hypertension, benign     Digestive   Esophageal reflux     Endocrine   Hypothyroidism     Other   Hyperlipidemia LDL goal <130   Chest pain  Imaging: Dg Chest 2  View  Result Date: 12/27/2016 CLINICAL DATA:  Generalized chest pain and tightness for 1 week. History of hypertension, thyroid cancer. EXAM: CHEST  2 VIEW COMPARISON:  Chest radiograph December 24, 2010 FINDINGS: Cardiomediastinal silhouette is normal. No pleural effusions or focal consolidations. Mild hyperinflation. Trachea projects midline and there is no pneumothorax. Soft tissue planes and included osseous structures are non-suspicious. Surgical clips in the included right abdomen compatible with cholecystectomy. Osteopenia with mild degenerative change of the thoracic spine. Surgical clips at thoracic inlet compatible with thyroidectomy. IMPRESSION: Hyperinflation, no focal consolidation. Electronically Signed   By: Elon Alas M.D.   On: 12/27/2016 17:52   Nm Myocar Multi W/spect W/wall Motion / Ef  Result Date: 12/29/2016  There was no ST segment deviation noted during stress.  The study is normal. Inferior defect most consistent with subdiaphragmatic attenuation and gut radiotracer uptake artifact as opposed to true inferior wall ischemia.  This is a low risk study.  The left ventricular ejection fraction is normal (55-65%).

## 2017-03-06 DIAGNOSIS — E890 Postprocedural hypothyroidism: Secondary | ICD-10-CM | POA: Insufficient documentation

## 2017-03-06 DIAGNOSIS — Z85850 Personal history of malignant neoplasm of thyroid: Secondary | ICD-10-CM | POA: Diagnosis not present

## 2017-03-22 ENCOUNTER — Other Ambulatory Visit: Payer: Self-pay | Admitting: *Deleted

## 2017-03-22 MED ORDER — ENALAPRIL MALEATE 5 MG PO TABS: ORAL_TABLET | ORAL | 0 refills | Status: DC

## 2017-05-15 DIAGNOSIS — R69 Illness, unspecified: Secondary | ICD-10-CM | POA: Diagnosis not present

## 2017-06-05 ENCOUNTER — Ambulatory Visit: Payer: Medicare HMO | Admitting: Family Medicine

## 2017-06-07 ENCOUNTER — Ambulatory Visit: Payer: Medicare Other | Admitting: Family Medicine

## 2017-06-13 ENCOUNTER — Other Ambulatory Visit: Payer: Self-pay | Admitting: Family Medicine

## 2017-06-15 ENCOUNTER — Telehealth: Payer: Self-pay | Admitting: Family Medicine

## 2017-06-15 DIAGNOSIS — E785 Hyperlipidemia, unspecified: Secondary | ICD-10-CM

## 2017-06-15 DIAGNOSIS — Z79899 Other long term (current) drug therapy: Secondary | ICD-10-CM

## 2017-06-15 DIAGNOSIS — E0590 Thyrotoxicosis, unspecified without thyrotoxic crisis or storm: Secondary | ICD-10-CM

## 2017-06-15 DIAGNOSIS — D510 Vitamin B12 deficiency anemia due to intrinsic factor deficiency: Secondary | ICD-10-CM

## 2017-06-15 DIAGNOSIS — I10 Essential (primary) hypertension: Secondary | ICD-10-CM

## 2017-06-15 NOTE — Telephone Encounter (Signed)
Patient has an appointment with Dr. Richardson Landry on 06/28/17.  She is requesting to pick up the order for her labs today.

## 2017-06-15 NOTE — Telephone Encounter (Signed)
Lipid, Liver, Met 7, CBC, TSH in July 2017

## 2017-06-15 NOTE — Telephone Encounter (Signed)
B12, lipid, liver, metabolic 7, TSH-hyperthyroidism hypertension hyperlipidemia and pernicious anemia

## 2017-06-15 NOTE — Telephone Encounter (Signed)
Orders placed in the system and sent to [facility]. Pt is aware to be fasting when labs are done.

## 2017-06-28 ENCOUNTER — Ambulatory Visit (INDEPENDENT_AMBULATORY_CARE_PROVIDER_SITE_OTHER): Payer: Medicare HMO | Admitting: Family Medicine

## 2017-06-28 ENCOUNTER — Encounter: Payer: Self-pay | Admitting: Family Medicine

## 2017-06-28 VITALS — BP 126/86 | Ht 67.0 in | Wt 170.2 lb

## 2017-06-28 DIAGNOSIS — I10 Essential (primary) hypertension: Secondary | ICD-10-CM | POA: Diagnosis not present

## 2017-06-28 DIAGNOSIS — E039 Hypothyroidism, unspecified: Secondary | ICD-10-CM | POA: Diagnosis not present

## 2017-06-28 DIAGNOSIS — K219 Gastro-esophageal reflux disease without esophagitis: Secondary | ICD-10-CM | POA: Diagnosis not present

## 2017-06-28 DIAGNOSIS — R5383 Other fatigue: Secondary | ICD-10-CM

## 2017-06-28 MED ORDER — ENALAPRIL MALEATE 5 MG PO TABS: ORAL_TABLET | ORAL | 1 refills | Status: DC

## 2017-06-28 MED ORDER — RANITIDINE HCL 300 MG PO CAPS: 300.0000 mg | ORAL_CAPSULE | Freq: Every evening | ORAL | 1 refills | Status: DC

## 2017-06-28 NOTE — Progress Notes (Signed)
   Subjective:    Patient ID: [name], female    DOB: Aug 19, 1946, 71 y.o.   MRN: [id number]  Hypertension  This is a chronic problem. The current episode started more than 1 year ago. There are no compliance problems.    Patient has concerns of lip irritation, For the past year. No bleeding. No excessive itching.   also has concerns of calcium level.  Pt had fell off on calcium supplement  Last numbers relatively low  Goes to dr altheimer for thyroid disease  RefluxOngoing at times. Uses when necessary medication. Generally helps.   hypertesion is ongoing  Blood pressure medicine and blood pressure levels reviewed today with patient. Compliant with blood pressure medicine. States does not miss a dose. No obvious side effects. Blood pressure generally good when checked elsewhere. Watching salt intake.     On aspirin for a long time, we did an aspirin screen and the patient can stop it. Not indicated discussed at length   Review of Systems No headache, no major weight loss or weight gain, no chest pain no back pain abdominal pain no change in bowel habits complete ROS otherwise negative     Objective:   Physical Exam  Alert and oriented, vitals reviewed and stable, NAD ENT-TM's and ext canals WNL bilat via otoscopic exam Soft palate, tonsils and post pharynx WNL via oropharyngeal exam Neck-symmetric, no masses; thyroid nonpalpable and nontender Pulmonary-no tachypnea or accessory muscle use; Clear without wheezes via auscultation Card--no abnrml murmurs, rhythm reg and rate WNL Carotid pulses symmetric, without bruits       Assessment & Plan:  Impression 1 hypertension good control discussed maintain same meds #2 hypothyroidism good control discussed maintain same #3 reflux discussed maintain same meds 4 status post gastric bypass. Reflux stable despite this. Continues to take multiple supplements. Blood work will be performed to assess  Greater than 50% of  this 25 minute face to face visit was spent in counseling and discussion and coordination of care regarding the above diagnosis/diagnosies

## 2017-06-30 DIAGNOSIS — I10 Essential (primary) hypertension: Secondary | ICD-10-CM | POA: Diagnosis not present

## 2017-06-30 DIAGNOSIS — Z79899 Other long term (current) drug therapy: Secondary | ICD-10-CM | POA: Diagnosis not present

## 2017-06-30 DIAGNOSIS — E785 Hyperlipidemia, unspecified: Secondary | ICD-10-CM | POA: Diagnosis not present

## 2017-06-30 DIAGNOSIS — E0590 Thyrotoxicosis, unspecified without thyrotoxic crisis or storm: Secondary | ICD-10-CM | POA: Diagnosis not present

## 2017-06-30 DIAGNOSIS — R5383 Other fatigue: Secondary | ICD-10-CM | POA: Diagnosis not present

## 2017-06-30 DIAGNOSIS — D510 Vitamin B12 deficiency anemia due to intrinsic factor deficiency: Secondary | ICD-10-CM | POA: Diagnosis not present

## 2017-07-01 LAB — VITAMIN B12: Vitamin B-12: 271 pg/mL (ref 232–1245)

## 2017-07-01 LAB — HEPATIC FUNCTION PANEL
ALT: 15 IU/L (ref 0–32)
AST: 16 IU/L (ref 0–40)
Albumin: 3.8 g/dL (ref 3.5–4.8)
Alkaline Phosphatase: 63 IU/L (ref 39–117)
Bilirubin Total: 0.5 mg/dL (ref 0.0–1.2)
Bilirubin, Direct: 0.14 mg/dL (ref 0.00–0.40)
Total Protein: 6 g/dL (ref 6.0–8.5)

## 2017-07-01 LAB — BASIC METABOLIC PANEL
BUN / CREAT RATIO: 21 (ref 12–28)
BUN: 16 mg/dL (ref 8–27)
CHLORIDE: 110 mmol/L — AB (ref 96–106)
CO2: 19 mmol/L — ABNORMAL LOW (ref 20–29)
Calcium: 8.6 mg/dL — ABNORMAL LOW (ref 8.7–10.3)
Creatinine, Ser: 0.77 mg/dL (ref 0.57–1.00)
GFR calc Af Amer: 89 mL/min/{1.73_m2} (ref >59)
GFR calc non Af Amer: 77 mL/min/{1.73_m2} (ref >59)
GLUCOSE: 84 mg/dL (ref 65–99)
POTASSIUM: 4.6 mmol/L (ref 3.5–5.2)
SODIUM: 143 mmol/L (ref 134–144)

## 2017-07-01 LAB — LIPID PANEL
CHOL/HDL RATIO: 2.7 ratio (ref 0.0–4.4)
Cholesterol, Total: 156 mg/dL (ref 100–199)
HDL: 57 mg/dL (ref >39)
LDL CALC: 82 mg/dL (ref 0–99)
Triglycerides: 86 mg/dL (ref 0–149)
VLDL Cholesterol Cal: 17 mg/dL (ref 5–40)

## 2017-07-01 LAB — TSH: TSH: 0.979 u[IU]/mL (ref 0.450–4.500)

## 2017-07-01 LAB — VITAMIN D 25 HYDROXY (VIT D DEFICIENCY, FRACTURES): VIT D 25 HYDROXY: 39.5 ng/mL (ref 30.0–100.0)

## 2017-07-02 ENCOUNTER — Encounter: Payer: Self-pay | Admitting: Family Medicine

## 2017-09-15 DIAGNOSIS — E890 Postprocedural hypothyroidism: Secondary | ICD-10-CM | POA: Diagnosis not present

## 2017-09-15 DIAGNOSIS — Z85850 Personal history of malignant neoplasm of thyroid: Secondary | ICD-10-CM | POA: Diagnosis not present

## 2017-09-26 ENCOUNTER — Ambulatory Visit (INDEPENDENT_AMBULATORY_CARE_PROVIDER_SITE_OTHER): Payer: Medicare HMO

## 2017-09-26 DIAGNOSIS — Z23 Encounter for immunization: Secondary | ICD-10-CM

## 2017-12-08 ENCOUNTER — Other Ambulatory Visit: Payer: Self-pay | Admitting: Family Medicine

## 2017-12-08 DIAGNOSIS — Z1231 Encounter for screening mammogram for malignant neoplasm of breast: Secondary | ICD-10-CM

## 2017-12-29 ENCOUNTER — Encounter: Payer: Self-pay | Admitting: Family Medicine

## 2017-12-29 ENCOUNTER — Ambulatory Visit (INDEPENDENT_AMBULATORY_CARE_PROVIDER_SITE_OTHER): Payer: Medicare HMO | Admitting: Family Medicine

## 2017-12-29 VITALS — BP 146/82 | Ht 67.0 in | Wt 175.1 lb

## 2017-12-29 DIAGNOSIS — E039 Hypothyroidism, unspecified: Secondary | ICD-10-CM

## 2017-12-29 DIAGNOSIS — I10 Essential (primary) hypertension: Secondary | ICD-10-CM

## 2017-12-29 DIAGNOSIS — K219 Gastro-esophageal reflux disease without esophagitis: Secondary | ICD-10-CM

## 2017-12-29 MED ORDER — ENALAPRIL MALEATE 5 MG PO TABS: ORAL_TABLET | ORAL | 1 refills | Status: DC

## 2017-12-29 MED ORDER — RANITIDINE HCL 300 MG PO CAPS: 300.0000 mg | ORAL_CAPSULE | Freq: Every evening | ORAL | 1 refills | Status: DC

## 2017-12-29 MED ORDER — VALACYCLOVIR HCL 1 G PO TABS: ORAL_TABLET | ORAL | 0 refills | Status: DC

## 2017-12-29 NOTE — Progress Notes (Signed)
   Subjective:    Patient ID: [name], female    DOB: 1946-09-11, 72 y.o.   MRN: [id number]  HPI  Patient is here today to follow up on Htn.She is currently on Vasotec 5 mg one QHS.Marland Kitchen She eats healthy and exercises by walking a mile and a half daily.  Blood pressure medicine and blood pressure levels reviewed today with patient. Compliant with blood pressure medicine. States does not miss a dose. No obvious side effects. Blood pressure generally good when checked elsewhere. Watching salt intake.   Recurrent fever blisters.  Patient requests prescription for Valtrex.  This definitely helped in the past.  Ongoing challenges with reflux.  Zantac pretty much helps symptomatology.  Patient realizes there are stronger meds but could cause her trouble with potential serious side effects  Continues to work hard on diet and exercise.  Status post bariatric intervention  Review of Systems No headache, no major weight loss or weight gain, no chest pain no back pain abdominal pain no change in bowel habits complete ROS otherwise negative     Objective:   Physical Exam Alert and oriented, vitals reviewed and stable, NAD ENT-TM's and ext canals WNL bilat via otoscopic exam Soft palate, tonsils and post pharynx WNL via oropharyngeal exam Neck-symmetric, no masses; thyroid nonpalpable and nontender Pulmonary-no tachypnea or accessory muscle use; Clear without wheezes via auscultation Card--no abnrml murmurs, rhythm reg and rate WNL Carotid pulses symmetric, without bruits        Assessment & Plan:  Impression 1 hypertension good control discussed to maintain same  2.  Reflux ongoing discussed to maintain same  3.  Recurrent fever blisters Valtrex use discussed and prescribed  4.  History of morbid obesity diet exercise discussed and reemphasized  5.  Hypothyroidism now followed by specialist  Greater than 50% of this 25 minute face to face visit was spent in counseling and  discussion and coordination of care regarding the above diagnosis/diagnosies

## 2018-01-02 ENCOUNTER — Ambulatory Visit
Admission: RE | Admit: 2018-01-02 | Discharge: 2018-01-02 | Disposition: A | Discharge status: Home / Self Care | Payer: Medicare HMO | Source: Ambulatory Visit | Attending: Family Medicine | Admitting: Family Medicine

## 2018-01-02 DIAGNOSIS — Z1231 Encounter for screening mammogram for malignant neoplasm of breast: Secondary | ICD-10-CM

## 2018-03-15 DIAGNOSIS — M810 Age-related osteoporosis without current pathological fracture: Secondary | ICD-10-CM | POA: Diagnosis not present

## 2018-03-15 DIAGNOSIS — Z79890 Hormone replacement therapy (postmenopausal): Secondary | ICD-10-CM | POA: Diagnosis not present

## 2018-03-15 DIAGNOSIS — Z85850 Personal history of malignant neoplasm of thyroid: Secondary | ICD-10-CM | POA: Diagnosis not present

## 2018-03-15 DIAGNOSIS — E890 Postprocedural hypothyroidism: Secondary | ICD-10-CM | POA: Diagnosis not present

## 2018-03-15 DIAGNOSIS — Z9884 Bariatric surgery status: Secondary | ICD-10-CM | POA: Diagnosis not present

## 2018-05-24 DIAGNOSIS — Z0100 Encounter for examination of eyes and vision without abnormal findings: Secondary | ICD-10-CM | POA: Diagnosis not present

## 2018-05-24 DIAGNOSIS — H5200 Hypermetropia, unspecified eye: Secondary | ICD-10-CM | POA: Diagnosis not present

## 2018-05-29 ENCOUNTER — Ambulatory Visit (INDEPENDENT_AMBULATORY_CARE_PROVIDER_SITE_OTHER): Payer: Medicare HMO | Admitting: Family Medicine

## 2018-05-29 ENCOUNTER — Encounter: Payer: Self-pay | Admitting: Family Medicine

## 2018-05-29 VITALS — BP 138/88 | Ht 67.0 in | Wt 180.6 lb

## 2018-05-29 DIAGNOSIS — M7061 Trochanteric bursitis, right hip: Secondary | ICD-10-CM

## 2018-05-29 DIAGNOSIS — I10 Essential (primary) hypertension: Secondary | ICD-10-CM

## 2018-05-29 DIAGNOSIS — M25551 Pain in right hip: Secondary | ICD-10-CM

## 2018-05-29 MED ORDER — HYDROCODONE-ACETAMINOPHEN 5-325 MG PO TABS: 1.0000 | ORAL_TABLET | Freq: Four times a day (QID) | ORAL | 0 refills | Status: DC | PRN

## 2018-05-29 NOTE — Progress Notes (Signed)
   Subjective:    Patient ID: [name], female    DOB: May 10, 1946, 72 y.o.   MRN: [id number]  HPI  Patient arrives with right hip pain -worse for last few weeks.  Pain on nd off for the last yr  Worse with walking  Working at [employer], can hut a lot, trouble with pain  Pain worse in right, pos pain on roght hip when stretched out    Takes occas ibu prn, so mainy using tylenol which does not help mch at all   Worse when on feet a  Some arthritis with mother , ut not a lot  Both kees replacd 20 yrs ago  Some osteoporosis   Review of Systems No headache, no major weight loss or weight gain, no chest pain no back pain abdominal pain no change in bowel habits complete ROS otherwise negative     Objective:   Physical Exam  Alert vitals stable, NAD. Blood pressure good on repeat. HEENT normal. Lungs clear. Heart regular rate and rhythm. Distinct tenderness right lateral trochanter.  Hip decent range of motion.  Negative straight leg raise  Patient was prepped and anesthetized sterilized and injected with 1 cc Depo-Medrol 2 cc plan but lidocaine in the code trochanteric process local measures discussed.  X-ray for further recommendations.  Recommended.  Hypertension.  Overall good control discussed to maintain same meds      Assessment & Plan:

## 2018-06-03 MED ORDER — METHYLPREDNISOLONE ACETATE 40 MG/ML IJ SUSP: 40.0000 mg | Freq: Once | INTRAMUSCULAR | Status: DC

## 2018-06-27 ENCOUNTER — Ambulatory Visit (INDEPENDENT_AMBULATORY_CARE_PROVIDER_SITE_OTHER): Payer: Medicare HMO | Admitting: Family Medicine

## 2018-06-27 ENCOUNTER — Encounter: Payer: Self-pay | Admitting: Family Medicine

## 2018-06-27 VITALS — BP 130/78 | Ht 67.0 in | Wt 171.6 lb

## 2018-06-27 DIAGNOSIS — Z79899 Other long term (current) drug therapy: Secondary | ICD-10-CM

## 2018-06-27 DIAGNOSIS — E785 Hyperlipidemia, unspecified: Secondary | ICD-10-CM

## 2018-06-27 DIAGNOSIS — I10 Essential (primary) hypertension: Secondary | ICD-10-CM | POA: Diagnosis not present

## 2018-06-27 DIAGNOSIS — R5383 Other fatigue: Secondary | ICD-10-CM | POA: Diagnosis not present

## 2018-06-27 DIAGNOSIS — D510 Vitamin B12 deficiency anemia due to intrinsic factor deficiency: Secondary | ICD-10-CM

## 2018-06-27 MED ORDER — RANITIDINE HCL 300 MG PO CAPS: 300.0000 mg | ORAL_CAPSULE | Freq: Every evening | ORAL | 1 refills | Status: DC

## 2018-06-27 MED ORDER — ENALAPRIL MALEATE 5 MG PO TABS
ORAL_TABLET | ORAL | 1 refills | Status: DC
Start: 2018-06-27 — End: 2018-11-08

## 2018-06-27 NOTE — Progress Notes (Signed)
   Subjective:    Patient ID: [name], female    DOB: 02-Dec-1946, 72 y.o.   MRN: [id number]  Hypertension  This is a chronic problem. The current episode started more than 1 year ago. Risk factors for coronary artery disease include post-menopausal state. Treatments tried: vasotec. There are no compliance problems.    Right hip is still bothering her   Blood pressure medicine and blood pressure levels reviewed today with patient. Compliant with blood pressure medicine. States does not miss a dose. No obvious side effects. Blood pressure generally good when checked elsewhere. Watching salt intake.   Hip noted hi starting up agan about as bad as it was  immed after the shot did better for a few weeks    Using  Using tylenol prn pain   Patient has history of hyperlipidemia.  Tries to cut down fats in her diet.  Mostly watching diet well these days.  Not exercising much due to the ongoing challenges with hip pain.  Patient reports reflux stable.  Does need her medicine however without it substantial difficulties   Review of Systems No headache, no major weight loss or weight gain, no chest pain no back pain abdominal pain no change in bowel habits complete ROS otherwise negative     Objective:   Physical Exam  Alert and oriented, vitals reviewed and stable, NAD ENT-TM's and ext canals WNL bilat via otoscopic exam Soft palate, tonsils and post pharynx WNL via oropharyngeal exam Neck-symmetric, no masses; thyroid nonpalpable and nontender Pulmonary-no tachypnea or accessory muscle use; Clear without wheezes via auscultation Card--no abnrml murmurs, rhythm reg and rate WNL Carotid pulses symmetric, without bruits See prior hip exam      Assessment & Plan:  Impression 1 hip pain.  Likely element of trochanteric bursitis.  In addition Patel potential element of joint involvement.  Discussed.  X-ray ordered.  Encourage patient to get  2.  Hypertension good control  discussed maintain same meds  3 hyperlipidemia status uncertain.  Will check other cofactors with patient's history of bariatric surgery  Greater than 50% of this 25 minute face to face visit was spent in counseling and discussion and coordination of care regarding the above diagnosis/diagnosies     medications refilled recheck in 6 months further recommendations based on blood work

## 2018-06-30 DIAGNOSIS — Z79899 Other long term (current) drug therapy: Secondary | ICD-10-CM | POA: Diagnosis not present

## 2018-06-30 DIAGNOSIS — I10 Essential (primary) hypertension: Secondary | ICD-10-CM | POA: Diagnosis not present

## 2018-06-30 DIAGNOSIS — D510 Vitamin B12 deficiency anemia due to intrinsic factor deficiency: Secondary | ICD-10-CM | POA: Diagnosis not present

## 2018-06-30 DIAGNOSIS — R5383 Other fatigue: Secondary | ICD-10-CM | POA: Diagnosis not present

## 2018-06-30 DIAGNOSIS — E785 Hyperlipidemia, unspecified: Secondary | ICD-10-CM | POA: Diagnosis not present

## 2018-07-02 LAB — CBC WITH DIFFERENTIAL/PLATELET
BASOS: 1 %
Basophils Absolute: 0 10*3/uL (ref 0.0–0.2)
EOS (ABSOLUTE): 0.3 10*3/uL (ref 0.0–0.4)
EOS: 6 %
HEMATOCRIT: 40.7 % (ref 34.0–46.6)
Hemoglobin: 13.1 g/dL (ref 11.1–15.9)
IMMATURE GRANS (ABS): 0 10*3/uL (ref 0.0–0.1)
IMMATURE GRANULOCYTES: 1 %
LYMPHS: 39 %
Lymphocytes Absolute: 1.9 10*3/uL (ref 0.7–3.1)
MCH: 29 pg (ref 26.6–33.0)
MCHC: 32.2 g/dL (ref 31.5–35.7)
MCV: 90 fL (ref 79–97)
Monocytes Absolute: 0.4 10*3/uL (ref 0.1–0.9)
Monocytes: 8 %
NEUTROS PCT: 45 %
Neutrophils Absolute: 2.3 10*3/uL (ref 1.4–7.0)
PLATELETS: 199 10*3/uL (ref 150–450)
RBC: 4.52 x10E6/uL (ref 3.77–5.28)
RDW: 12.9 % (ref 12.3–15.4)
WBC: 5 10*3/uL (ref 3.4–10.8)

## 2018-07-02 LAB — HEPATIC FUNCTION PANEL
ALBUMIN: 3.6 g/dL (ref 3.5–4.8)
ALK PHOS: 64 IU/L (ref 39–117)
ALT: 19 IU/L (ref 0–32)
AST: 13 IU/L (ref 0–40)
Bilirubin Total: 0.4 mg/dL (ref 0.0–1.2)
Bilirubin, Direct: 0.11 mg/dL (ref 0.00–0.40)
TOTAL PROTEIN: 6.1 g/dL (ref 6.0–8.5)

## 2018-07-02 LAB — LIPID PANEL
Chol/HDL Ratio: 3 ratio (ref 0.0–4.4)
Cholesterol, Total: 163 mg/dL (ref 100–199)
HDL: 54 mg/dL (ref >39)
LDL CALC: 91 mg/dL (ref 0–99)
Triglycerides: 90 mg/dL (ref 0–149)
VLDL CHOLESTEROL CAL: 18 mg/dL (ref 5–40)

## 2018-07-02 LAB — BASIC METABOLIC PANEL
BUN/Creatinine Ratio: 32 — ABNORMAL HIGH (ref 12–28)
BUN: 22 mg/dL (ref 8–27)
CALCIUM: 8.7 mg/dL (ref 8.7–10.3)
CO2: 20 mmol/L (ref 20–29)
CREATININE: 0.69 mg/dL (ref 0.57–1.00)
Chloride: 110 mmol/L — ABNORMAL HIGH (ref 96–106)
GFR calc Af Amer: 100 mL/min/{1.73_m2} (ref >59)
GFR, EST NON AFRICAN AMERICAN: 87 mL/min/{1.73_m2} (ref >59)
Glucose: 78 mg/dL (ref 65–99)
Potassium: 4.8 mmol/L (ref 3.5–5.2)
Sodium: 143 mmol/L (ref 134–144)

## 2018-07-02 LAB — VITAMIN B12: Vitamin B-12: 238 pg/mL (ref 232–1245)

## 2018-07-02 LAB — VITAMIN D 25 HYDROXY (VIT D DEFICIENCY, FRACTURES): Vit D, 25-Hydroxy: 33.9 ng/mL (ref 30.0–100.0)

## 2018-07-04 ENCOUNTER — Ambulatory Visit (HOSPITAL_COMMUNITY)
Admission: RE | Admit: 2018-07-04 | Discharge: 2018-07-04 | Disposition: A | Discharge status: Home / Self Care | Payer: Medicare HMO | Source: Ambulatory Visit | Attending: Family Medicine | Admitting: Family Medicine

## 2018-07-04 DIAGNOSIS — M25551 Pain in right hip: Secondary | ICD-10-CM | POA: Diagnosis not present

## 2018-07-04 DIAGNOSIS — M160 Bilateral primary osteoarthritis of hip: Secondary | ICD-10-CM | POA: Diagnosis not present

## 2018-07-05 NOTE — Addendum Note (Signed)
Addended by: Karle Barr on: 07/05/2018 11:34 AM   Modules accepted: Orders

## 2018-07-09 ENCOUNTER — Encounter (INDEPENDENT_AMBULATORY_CARE_PROVIDER_SITE_OTHER): Payer: Self-pay

## 2018-07-09 ENCOUNTER — Encounter: Payer: Self-pay | Admitting: Family Medicine

## 2018-08-14 ENCOUNTER — Ambulatory Visit (INDEPENDENT_AMBULATORY_CARE_PROVIDER_SITE_OTHER): Payer: Medicare HMO | Admitting: Orthopaedic Surgery

## 2018-08-16 ENCOUNTER — Ambulatory Visit (INDEPENDENT_AMBULATORY_CARE_PROVIDER_SITE_OTHER): Payer: Medicare HMO | Admitting: Orthopaedic Surgery

## 2018-08-16 ENCOUNTER — Encounter (INDEPENDENT_AMBULATORY_CARE_PROVIDER_SITE_OTHER): Payer: Self-pay | Admitting: Orthopaedic Surgery

## 2018-08-16 DIAGNOSIS — M1611 Unilateral primary osteoarthritis, right hip: Secondary | ICD-10-CM | POA: Diagnosis not present

## 2018-08-16 NOTE — Progress Notes (Signed)
Subjective: She is here for ultrasound-guided right hip injection.  She has been dealing with pain for about 6 months.  Her PCP gave her a greater trochanter injection in June with good results, but it was right before a trip out [place] which involved a lot of walking.  By the time she returned, her pain came back.  She states that her pain is primarily on the lateral aspect of her hip, denies any groin pain.  She states that her pain is really not as severe as it was prior to her trip, she is not totally sure she is ready to proceed with another injection.  Pain wakes her at night only when she rolls over on her side.  She has stiffness in her hip when she first stands up after sitting, but then it gets better.  Objective: Point tender over the greater trochanter.  Mild pain with passive internal rotation, and slightly limited range of motion.  Plan: Per patient request we will hold off on injection for now.  She wants to try some home exercises which were given today.  If symptoms worsen, then we will consider ultrasound-guided intra-articular injection or possibly another greater trochanter injection.

## 2018-09-17 DIAGNOSIS — Z85850 Personal history of malignant neoplasm of thyroid: Secondary | ICD-10-CM | POA: Diagnosis not present

## 2018-09-17 DIAGNOSIS — E890 Postprocedural hypothyroidism: Secondary | ICD-10-CM | POA: Diagnosis not present

## 2018-10-12 ENCOUNTER — Telehealth (INDEPENDENT_AMBULATORY_CARE_PROVIDER_SITE_OTHER): Payer: Self-pay | Admitting: Orthopaedic Surgery

## 2018-10-12 NOTE — Telephone Encounter (Signed)
Called the patient and scheduled her for 9:40 with Dr. Junius Roads on 10/15/18.

## 2018-10-15 ENCOUNTER — Encounter (INDEPENDENT_AMBULATORY_CARE_PROVIDER_SITE_OTHER): Payer: Self-pay | Admitting: Family Medicine

## 2018-10-15 ENCOUNTER — Ambulatory Visit (INDEPENDENT_AMBULATORY_CARE_PROVIDER_SITE_OTHER): Payer: Medicare HMO | Admitting: Family Medicine

## 2018-10-15 DIAGNOSIS — M1611 Unilateral primary osteoarthritis, right hip: Secondary | ICD-10-CM | POA: Diagnosis not present

## 2018-10-15 MED ORDER — METHYLPREDNISOLONE ACETATE 40 MG/ML IJ SUSP: 40.0000 mg | Freq: Once | INTRAMUSCULAR | Status: DC

## 2018-10-15 MED ORDER — HYDROCODONE-ACETAMINOPHEN 5-325 MG PO TABS: 1.0000 | ORAL_TABLET | Freq: Every evening | ORAL | 0 refills | Status: DC | PRN

## 2018-10-15 NOTE — Progress Notes (Signed)
Office Visit Note   Patient: [name]           Date of Birth: 18-Oct-1946           MRN: [id number] Visit Date: 10/15/2018 Requested by: Mikey Kirschner, [address], [place] [id number] PCP: Mikey Kirschner, MD  Subjective: Chief Complaint  Patient presents with  . Right Hip - Pain    Hip injection (referred by Dr. Erlinda Hong)    HPI: She is here with worsening right hip pain.  Since last visit her pain is gotten worse and she decided she wants to try an injection if possible.  Lateral hip pain with occasional groin pain.  She also reports that her low back bothers her, and both of her legs hurt whenever she tries to walk, the pain goes all the way down to her feet.              ROS: Noncontributory  Objective: Vital Signs: There were no vitals taken for this visit.  Physical Exam:  Right hip: Limited passive lection and internal rotation motion with quite a bit of pain.  She is tender over the greater trochanter as well.  Imaging: None today.  Assessment & Plan: 1.  Right hip pain with underlying arthritis, possibly causing her pain but could also have a lumbar source such as stenosis -Discussed with her, elected to try a diagnostic/therapeutic injection today.  If no improvement, then possibly physical therapy in [place].  If still no improvement, then lumbar x-rays and MRI scan.   Follow-Up Instructions: No follow-ups on file.       Procedures: Ultrasound-guided right hip injection: After sterile prep with Betadine, injected 5 cc 1% lidocaine without epinephrine and 40 mg methylprednisolone, passing the 22-gauge spinal needle through the iliofemoral ligament into the femoral head/neck junction.  The injectate was seen filling the joint capsule.  Unfortunately, she did not have a lot of relief during the immediate anesthetic phase.   PMFS History: Patient Active Problem List   Diagnosis Date Noted  . Chest pain 12/27/2016  .  Hypothyroidism 05/22/2016  . Essential hypertension, benign 09/09/2013  . Hyperlipidemia LDL goal <130 09/09/2013  . Esophageal reflux 09/09/2013   Past Medical History:  Diagnosis Date  . Cancer [facility])    Thyroid cancer  . Hypercholesteremia    resolved after gastric bypass  . Hypertension   . Hypothyroidism     Family History  Problem Relation Age of Onset  . Pneumonia Mother 40  . CAD Father 38  . CAD Sister 65  . CAD Brother 38  . Other Maternal Grandmother 96  . Other Brother 3       drug overdose  . CAD Other 71  . Colon cancer Neg Hx     Past Surgical History:  Procedure Laterality Date  . ABDOMINAL HYSTERECTOMY    . BILATERAL SALPINGOOPHORECTOMY    . BREAST EXCISIONAL BIOPSY Bilateral 1995   benign  . CHOLECYSTECTOMY    . COLONOSCOPY  11/14/2011   Procedure: COLONOSCOPY;  Surgeon: Daneil Dolin, MD;  Location: [facility];  Service: Endoscopy;  Laterality: N/A;  . REPLACEMENT TOTAL KNEE BILATERAL    . ROUX-EN-Y PROCEDURE  2007  . TONSILLECTOMY    . TOTAL THYROIDECTOMY  12/2010  . TUBAL LIGATION     Social History   Occupational History  . Occupation: Retired Therapist, art  Tobacco Use  . Smoking status: Never Smoker  . Smokeless  tobacco: Never Used  Substance and Sexual Activity  . Alcohol use: No  . Drug use: No  . Sexual activity: Not on file

## 2018-10-24 ENCOUNTER — Encounter: Payer: Self-pay | Admitting: Family Medicine

## 2018-10-24 ENCOUNTER — Ambulatory Visit (INDEPENDENT_AMBULATORY_CARE_PROVIDER_SITE_OTHER): Payer: Medicare HMO | Admitting: Family Medicine

## 2018-10-24 VITALS — BP 124/88 | Temp 98.1°F | Ht 67.0 in | Wt 174.2 lb

## 2018-10-24 DIAGNOSIS — J329 Chronic sinusitis, unspecified: Secondary | ICD-10-CM

## 2018-10-24 DIAGNOSIS — Z23 Encounter for immunization: Secondary | ICD-10-CM

## 2018-10-24 MED ORDER — BENZONATATE 100 MG PO CAPS: 100.0000 mg | ORAL_CAPSULE | Freq: Four times a day (QID) | ORAL | 0 refills | Status: DC | PRN

## 2018-10-24 NOTE — Patient Instructions (Signed)
Dimetapp liquid two tspns

## 2018-10-24 NOTE — Progress Notes (Signed)
   Subjective:    Patient ID: [name], female    DOB: Apr 03, 1946, 72 y.o.   MRN: [id number]  Cough  This is a new problem. The current episode started in the past 7 days. The cough is productive of sputum. Associated symptoms include headaches, rhinorrhea and a sore throat. Treatments tried: Robitussin  The treatment provided mild relief.   bacd cough, min productive  No fever, felt a bit warm   Energy level poor m   Pos headache,  Trying tob dm   soe sore throa with it    Review of Systems  HENT: Positive for rhinorrhea and sore throat.   Respiratory: Positive for cough.   Neurological: Positive for headaches.       Objective:   Physical Exam  Alert, mild malaise. Hydration good Vitals stable. frontal/ maxillary tenderness evident positive nasal congestion. pharynx normal neck supple  lungs clear/no crackles or wheezes. heart regular in rhythm       Assessment & Plan:  Impression rhinosinusitis likely post viral, discussed with patient. plan antibiotics prescribed. Questions answered. Symptomatic care discussed. warning signs discussed. WSL

## 2018-10-29 ENCOUNTER — Ambulatory Visit: Payer: Medicare HMO

## 2018-11-08 ENCOUNTER — Other Ambulatory Visit: Payer: Self-pay | Admitting: Family Medicine

## 2018-11-09 ENCOUNTER — Other Ambulatory Visit: Payer: Self-pay | Admitting: *Deleted

## 2018-11-09 ENCOUNTER — Telehealth: Payer: Self-pay | Admitting: Family Medicine

## 2018-11-09 MED ORDER — FAMOTIDINE 40 MG PO TABS: 40.0000 mg | ORAL_TABLET | Freq: Every day | ORAL | 3 refills | Status: DC

## 2018-11-09 NOTE — Telephone Encounter (Signed)
Sure, but trying to switch everyone to famotidine 40, lets do, one yrs worth

## 2018-11-09 NOTE — Telephone Encounter (Signed)
Discussed with pt and new med sent to [facility].

## 2018-11-09 NOTE — Telephone Encounter (Signed)
Pharmacy sent fax stating that Ranitidine 300 mg capsule not available. Pharmacy wanting to know if they can dispense 300 mg tablets? Please advise. Thank you

## 2018-11-13 ENCOUNTER — Other Ambulatory Visit: Payer: Self-pay | Admitting: Family Medicine

## 2018-12-07 ENCOUNTER — Encounter: Payer: Self-pay | Admitting: Family Medicine

## 2018-12-07 ENCOUNTER — Ambulatory Visit (INDEPENDENT_AMBULATORY_CARE_PROVIDER_SITE_OTHER): Payer: Medicare HMO | Admitting: Family Medicine

## 2018-12-07 VITALS — BP 140/82 | Temp 98.0°F | Ht 67.0 in | Wt 175.1 lb

## 2018-12-07 DIAGNOSIS — J329 Chronic sinusitis, unspecified: Secondary | ICD-10-CM | POA: Diagnosis not present

## 2018-12-07 MED ORDER — BENZONATATE 100 MG PO CAPS: 100.0000 mg | ORAL_CAPSULE | Freq: Four times a day (QID) | ORAL | 2 refills | Status: AC | PRN

## 2018-12-07 NOTE — Progress Notes (Signed)
   Subjective:    Patient ID: [name], female    DOB: 1946/06/07, 72 y.o.   MRN: [id number]  HPI Patient is here today with complaints of sore throat, cough,sinus drainage,cough,itching eyes, ongoing for the last three days. She has had Tylenol and Nyquil which has not helped.   Cough some productive, some gunkiness soe not  Some cong in the chest but not bad   No fever  Some productive   HA not a lot some , off and on, worse with cough   Some cong and drange     Glands a bit sore       Review of Systems No headache, no major weight loss or weight gain, no chest pain no back pain abdominal pain no change in bowel habits complete ROS otherwise negative     Objective:   Physical Exam  Alert, mild malaise. Hydration good Vitals stable. frontal/ maxillary tenderness evident positive nasal congestion. pharynx normal neck supple  lungs clear/no crackles or wheezes. heart regular in rhythm       Assessment & Plan:  Impression rhinosinusitis likely post viral, discussed with patient. plan antibiotics prescribed. Questions answered. Symptomatic care discussed. warning signs discussed. WSL

## 2018-12-10 ENCOUNTER — Other Ambulatory Visit: Payer: Self-pay | Admitting: Family Medicine

## 2018-12-10 DIAGNOSIS — Z1231 Encounter for screening mammogram for malignant neoplasm of breast: Secondary | ICD-10-CM

## 2018-12-31 ENCOUNTER — Telehealth: Payer: Self-pay | Admitting: Family Medicine

## 2018-12-31 ENCOUNTER — Ambulatory Visit (INDEPENDENT_AMBULATORY_CARE_PROVIDER_SITE_OTHER): Payer: Medicare HMO | Admitting: Family Medicine

## 2018-12-31 ENCOUNTER — Encounter: Payer: Self-pay | Admitting: Family Medicine

## 2018-12-31 ENCOUNTER — Other Ambulatory Visit: Payer: Self-pay

## 2018-12-31 ENCOUNTER — Encounter: Payer: Self-pay | Admitting: *Deleted

## 2018-12-31 VITALS — BP 134/82 | Ht 67.0 in | Wt 172.0 lb

## 2018-12-31 DIAGNOSIS — I10 Essential (primary) hypertension: Secondary | ICD-10-CM | POA: Diagnosis not present

## 2018-12-31 DIAGNOSIS — K219 Gastro-esophageal reflux disease without esophagitis: Secondary | ICD-10-CM

## 2018-12-31 DIAGNOSIS — M25551 Pain in right hip: Secondary | ICD-10-CM | POA: Diagnosis not present

## 2018-12-31 MED ORDER — FAMOTIDINE 40 MG PO TABS: 40.0000 mg | ORAL_TABLET | Freq: Every day | ORAL | 3 refills | Status: DC

## 2018-12-31 MED ORDER — HYDROCODONE-ACETAMINOPHEN 5-325 MG PO TABS: ORAL_TABLET | ORAL | 0 refills | Status: DC

## 2018-12-31 MED ORDER — ENALAPRIL MALEATE 5 MG PO TABS: ORAL_TABLET | ORAL | 1 refills | Status: DC

## 2018-12-31 NOTE — Addendum Note (Signed)
Addended by: Karle Barr on: 12/31/2018 09:40 AM   Modules accepted: Orders

## 2018-12-31 NOTE — Telephone Encounter (Signed)
Dr Richardson Landry just sent in rx for hydrocodone and pt was notified.

## 2018-12-31 NOTE — Telephone Encounter (Signed)
Pt checking on status of HYDROcodone-acetaminophen (NORCO/VICODIN) 5-325 MG tablet refill

## 2018-12-31 NOTE — Progress Notes (Signed)
   Subjective:    Patient ID: [name], female    DOB: January 31, 1946, 73 y.o.   MRN: [id number] Patient arrives with numerous concerns HPI  Patient is here today to follow up on her chronic illnesses.  She has a history of Hypertension and is taking Vasotec 5 mg once per day.  A history of Esophageal reflux and is taking Famotidine 40 mg once per day.She states this is not working as well ranitidine.notes generic pepcid not doing as well. Still having substernal reflux and pressure, usually fter food intake     Blood pressure medicine and blood pressure levels reviewed today with patient. Compliant with blood pressure medicine. States does not miss a dose. No obvious side effects. Blood pressure generally good when checked elsewhere. Watching salt intake.   She would like a rx for hydrocodone for her right hip.had injection of the hip, did not help much after two weeks  Has ongoing spells of severe pain in the right hip.  At that time hydrocodone definitely helps.  Received a prescription from [place] last summer and from the Ortho doctors in the fall that definitely helped.  Needs a refill.   Pt using hydrocod as needed for pain      Review of Systems No headache, no major weight loss or weight gain, no chest pain no back pain abdominal pain no change in bowel habits complete ROS otherwise negative     Objective:   Physical Exam Alert and oriented, vitals reviewed and stable, NAD ENT-TM's and ext canals WNL bilat via otoscopic exam Soft palate, tonsils and post pharynx WNL via oropharyngeal exam Neck-symmetric, no masses; thyroid nonpalpable and nontender Pulmonary-no tachypnea or accessory muscle use; Clear without wheezes via auscultation Card--no abnrml murmurs, rhythm reg and rate WNL Carotid pulses symmetric, without bruits        Assessment & Plan:  Impression 1 hypertension.  Blood pressure good on repeat.  Lites discussed maintain same meds.  2.  Reflux.   Suboptimum.  No ranitidine available.  Discussed.  To add PRN over-the-counter Maalox Mylanta etc.  3.  Chronic hip pain.  Had an injection.  Did not help much.  Will refill 1 prescription on hydrocodone rationale discussed  Diet exercise discussed.  Follow-up in 6 months

## 2018-12-31 NOTE — Addendum Note (Signed)
Addended by: Mikey Kirschner on: 12/31/2018 11:04 AM   Modules accepted: Orders

## 2019-01-07 ENCOUNTER — Ambulatory Visit: Payer: Self-pay

## 2019-01-14 ENCOUNTER — Ambulatory Visit
Admission: RE | Admit: 2019-01-14 | Discharge: 2019-01-14 | Disposition: A | Discharge status: Home / Self Care | Payer: Medicare HMO | Source: Ambulatory Visit | Attending: Family Medicine | Admitting: Family Medicine

## 2019-01-14 DIAGNOSIS — Z1231 Encounter for screening mammogram for malignant neoplasm of breast: Secondary | ICD-10-CM | POA: Diagnosis not present

## 2019-02-06 ENCOUNTER — Other Ambulatory Visit: Payer: Self-pay | Admitting: Family Medicine

## 2019-03-04 ENCOUNTER — Telehealth: Payer: Self-pay | Admitting: Family Medicine

## 2019-03-04 ENCOUNTER — Other Ambulatory Visit: Payer: Self-pay | Admitting: Family Medicine

## 2019-03-04 MED ORDER — HYDROCODONE-HOMATROPINE 5-1.5 MG/5ML PO SYRP: ORAL_SOLUTION | ORAL | 0 refills | Status: DC

## 2019-03-04 NOTE — Telephone Encounter (Signed)
done

## 2019-03-04 NOTE — Telephone Encounter (Signed)
Patient called to check on the status of this refill.

## 2019-03-04 NOTE — Telephone Encounter (Signed)
[place], likely has one very low chance of it being the coronavirus. Call in three oz hydromet one tspn q six hrs prn cough

## 2019-03-04 NOTE — Telephone Encounter (Signed)
No vomiting or diarrhea, no shortness of breath. Pt states that her cough is keeping her up at night. Pt is able to eat and drink. 99.8-101. Has no had fever in days. Pt would like Hydromet cough syrup called in. Please advise. Thank you

## 2019-03-04 NOTE — Telephone Encounter (Signed)
Pt states she's been sick for about a week and a half  Pt states she's had cough (OTC meds haven't helped), head congestion, low grade fever, diarrhea for 5 days  Pt states feeling better now except for cough that is bad through the day & keeps her up at night  Pt states her daughter had the same symptoms and he doc wrote her some Hydromet cough syrup & it really helped her cough especially at night  Pt wonders if we can order her the same cough medicine also wonders if she should be tested   [facility]   Please advise & call pt

## 2019-03-04 NOTE — Telephone Encounter (Signed)
Pt is aware and verbalized understanding.

## 2019-05-28 ENCOUNTER — Ambulatory Visit (INDEPENDENT_AMBULATORY_CARE_PROVIDER_SITE_OTHER): Payer: Medicare HMO | Admitting: Orthopaedic Surgery

## 2019-05-28 ENCOUNTER — Encounter: Payer: Self-pay | Admitting: Orthopaedic Surgery

## 2019-05-28 ENCOUNTER — Other Ambulatory Visit: Payer: Self-pay

## 2019-05-28 DIAGNOSIS — M1611 Unilateral primary osteoarthritis, right hip: Secondary | ICD-10-CM | POA: Diagnosis not present

## 2019-05-28 NOTE — Progress Notes (Signed)
Office Visit Note   Patient: [name]           Date of Birth: 1946/01/12           MRN: [id number] Visit Date: 05/28/2019              Requested by: Mikey Kirschner, [address] [place],  [place] [id number] PCP: Mikey Kirschner, MD   Assessment & Plan: Visit Diagnoses:  1. Primary osteoarthritis of right hip     Plan: Impression is right hip end-stage DJD.  I will have Dr. Junius Roads inject her right hip again today to give her some temporary relief.  She will call us back when she is ready for hip replacement.  Risks and benefits and rehab and recovery regarding the hip replacement were discussed today.  Follow-Up Instructions: No follow-ups on file.   Orders:  No orders of the defined types were placed in this encounter.  No orders of the defined types were placed in this encounter.     Procedures: No procedures performed   Clinical Data: No additional findings.   Subjective: Chief Complaint  Patient presents with  . Right Hip - Pain    [name] comes in today for follow-up of her right hip degenerative joint disease.  She had only 2 weeks of relief from the previous injection but she would like to have another injection today because she is going to the beach this weekend and would like to have some relief.  She understands that she will need a hip replacement in the near future.  She is thinking by having this done at the end of the summer.   Review of Systems   Objective: Vital Signs: There were no vitals taken for this visit.  Physical Exam  Ortho Exam Right hip exam is unchanged and stable. Specialty Comments:  No specialty comments available.  Imaging: No results found.   PMFS History: Patient Active Problem List   Diagnosis Date Noted  . Chest pain 12/27/2016  . Hypothyroidism 05/22/2016  . Essential hypertension, benign 09/09/2013  . Hyperlipidemia LDL goal <130 09/09/2013  . Esophageal reflux 09/09/2013   Past  Medical History:  Diagnosis Date  . Cancer [facility])    Thyroid cancer  . Hypercholesteremia    resolved after gastric bypass  . Hypertension   . Hypothyroidism     Family History  Problem Relation Age of Onset  . Pneumonia Mother 18  . CAD Father 33  . CAD Sister 90  . CAD Brother 60  . Other Maternal Grandmother 96  . Other Brother 10       drug overdose  . CAD Other 67  . Colon cancer Neg Hx     Past Surgical History:  Procedure Laterality Date  . ABDOMINAL HYSTERECTOMY    . BILATERAL SALPINGOOPHORECTOMY    . BREAST EXCISIONAL BIOPSY Bilateral 1995   benign  . CHOLECYSTECTOMY    . COLONOSCOPY  11/14/2011   Procedure: COLONOSCOPY;  Surgeon: Daneil Dolin, MD;  Location: [facility];  Service: Endoscopy;  Laterality: N/A;  . REPLACEMENT TOTAL KNEE BILATERAL    . ROUX-EN-Y PROCEDURE  2007  . TONSILLECTOMY    . TOTAL THYROIDECTOMY  12/2010  . TUBAL LIGATION     Social History   Occupational History  . Occupation: Retired Therapist, art  Tobacco Use  . Smoking status: Never Smoker  . Smokeless tobacco: Never Used  Substance and Sexual Activity  . Alcohol  use: No  . Drug use: No  . Sexual activity: Not on file

## 2019-05-28 NOTE — Progress Notes (Signed)
Subjective: Patient is here for ultrasound-guided intra-articular right hip injection.  Hoping to buy some time before having replacement.  Objective:  Pain and decreased ROM with IR.  Procedure: Ultrasound-guided right hip injection: After sterile prep with Betadine, injected 8 cc 1% lidocaine without epinephrine and 40 mg methylprednisolone using a 22-gauge spinal needle, passing the needle through the iliofemoral ligament into the femoral head/neck junction.  Injectate seen filling joint capsule.  RTC as directed.

## 2019-06-11 DIAGNOSIS — R32 Unspecified urinary incontinence: Secondary | ICD-10-CM | POA: Diagnosis not present

## 2019-06-11 DIAGNOSIS — M1990 Unspecified osteoarthritis, unspecified site: Secondary | ICD-10-CM | POA: Diagnosis not present

## 2019-06-11 DIAGNOSIS — M810 Age-related osteoporosis without current pathological fracture: Secondary | ICD-10-CM | POA: Diagnosis not present

## 2019-06-11 DIAGNOSIS — K219 Gastro-esophageal reflux disease without esophagitis: Secondary | ICD-10-CM | POA: Diagnosis not present

## 2019-06-11 DIAGNOSIS — Z885 Allergy status to narcotic agent status: Secondary | ICD-10-CM | POA: Diagnosis not present

## 2019-06-11 DIAGNOSIS — G8929 Other chronic pain: Secondary | ICD-10-CM | POA: Diagnosis not present

## 2019-06-11 DIAGNOSIS — E039 Hypothyroidism, unspecified: Secondary | ICD-10-CM | POA: Diagnosis not present

## 2019-06-11 DIAGNOSIS — Z85850 Personal history of malignant neoplasm of thyroid: Secondary | ICD-10-CM | POA: Diagnosis not present

## 2019-06-11 DIAGNOSIS — I10 Essential (primary) hypertension: Secondary | ICD-10-CM | POA: Diagnosis not present

## 2019-06-11 DIAGNOSIS — G629 Polyneuropathy, unspecified: Secondary | ICD-10-CM | POA: Diagnosis not present

## 2019-07-01 ENCOUNTER — Other Ambulatory Visit: Payer: Self-pay

## 2019-07-01 ENCOUNTER — Ambulatory Visit (INDEPENDENT_AMBULATORY_CARE_PROVIDER_SITE_OTHER): Payer: Medicare HMO | Admitting: Family Medicine

## 2019-07-01 DIAGNOSIS — D510 Vitamin B12 deficiency anemia due to intrinsic factor deficiency: Secondary | ICD-10-CM

## 2019-07-01 DIAGNOSIS — I10 Essential (primary) hypertension: Secondary | ICD-10-CM

## 2019-07-01 DIAGNOSIS — R5383 Other fatigue: Secondary | ICD-10-CM | POA: Diagnosis not present

## 2019-07-01 DIAGNOSIS — Z79899 Other long term (current) drug therapy: Secondary | ICD-10-CM | POA: Diagnosis not present

## 2019-07-01 DIAGNOSIS — E785 Hyperlipidemia, unspecified: Secondary | ICD-10-CM | POA: Diagnosis not present

## 2019-07-01 MED ORDER — ENALAPRIL MALEATE 5 MG PO TABS: 5.0000 mg | ORAL_TABLET | Freq: Every day | ORAL | 1 refills | Status: DC

## 2019-07-01 MED ORDER — FAMOTIDINE 40 MG PO TABS: 40.0000 mg | ORAL_TABLET | Freq: Every day | ORAL | 1 refills | Status: DC

## 2019-07-01 NOTE — Progress Notes (Signed)
   Subjective:    Patient ID: [name], female    DOB: July 22, 1946, 73 y.o.   MRN: [id number] Audio plus video  Presents to follow-up on chronic health concerns Hypertension This is a chronic problem. Treatments tried: enalapril 5mg  qhs. There are no compliance problems (takes med every day, eats fairly healthy, active and walks at least one mile a day).   BP taken this morning. 144/88 pulse 64.  Pt would like a refill on hydrocodone 5/325. States the last refill has lasted her 6 months. Takes for hip pain.  Uses intermittently.  When the hip really flares up she likes to have this medication on hand  Followed by specialist for hypothyroidism  History of gastric bypass.  Generally goes blood work to assess  .htn Blood pressure medicine and blood pressure levels reviewed today with patient. Compliant with blood pressure medicine. States does not miss a dose. No obvious side effects. Blood pressure generally good when checked elsewhere. Watching salt intake.    Sees specialist for her thyroid.   Virtual Visit via Video Note  I connected with [name] on 07/01/19 at 10:00 AM EDT by a video enabled telemedicine application and verified that I am speaking with the correct person using two identifiers.  Location: Patient: home Provider: office   I discussed the limitations of evaluation and management by telemedicine and the availability of in person appointments. The patient expressed understanding and agreed to proceed.  History of Present Illness:    Observations/Objective:   Assessment and Plan:   Follow Up Instructions:    I discussed the assessment and treatment plan with the patient. The patient was provided an opportunity to ask questions and all were answered. The patient agreed with the plan and demonstrated an understanding of the instructions.   The patient was advised to call back or seek an in-person evaluation if the symptoms worsen or if the  condition fails to improve as anticipated.  I provided 25 minutes of non-face-to-face time during this encounter.  Blood pressure medicine and blood pressure levels reviewed today with patient. Compliant with blood pressure medicine. States does not miss a dose. No obvious side effects. Blood pressure generally good when checked elsewhere. Watching salt intake.  arthritis History of elevated cholesterol.  Working on diet trying to cut down.  Not exercising much at this time   147 over 78 last blood pressure  Review of Systems No headache, no major weight loss or weight gain, no chest pain no back pain abdominal pain no change in bowel habits complete ROS otherwise negative     Objective:   Physical Exam  Virtual      Assessment & Plan:  Impression 1 chronic pain rare use of hydrocodone we will go ahead and authorize  2.  Known hyperlipidemia status uncertain will evaluate  3.  Hypertension.  Control overall good discussed to maintain same  Appropriate blood work diet exercise discussed

## 2019-07-02 DIAGNOSIS — Z79899 Other long term (current) drug therapy: Secondary | ICD-10-CM | POA: Diagnosis not present

## 2019-07-02 DIAGNOSIS — R5383 Other fatigue: Secondary | ICD-10-CM | POA: Diagnosis not present

## 2019-07-02 DIAGNOSIS — D510 Vitamin B12 deficiency anemia due to intrinsic factor deficiency: Secondary | ICD-10-CM | POA: Diagnosis not present

## 2019-07-02 DIAGNOSIS — E785 Hyperlipidemia, unspecified: Secondary | ICD-10-CM | POA: Diagnosis not present

## 2019-07-02 DIAGNOSIS — I10 Essential (primary) hypertension: Secondary | ICD-10-CM | POA: Diagnosis not present

## 2019-07-03 ENCOUNTER — Encounter: Payer: Self-pay | Admitting: Family Medicine

## 2019-07-03 ENCOUNTER — Telehealth: Payer: Self-pay | Admitting: Family Medicine

## 2019-07-03 LAB — HEPATIC FUNCTION PANEL
ALT: 17 IU/L (ref 0–32)
AST: 15 IU/L (ref 0–40)
Albumin: 3.8 g/dL (ref 3.7–4.7)
Alkaline Phosphatase: 61 IU/L (ref 39–117)
Bilirubin Total: 0.5 mg/dL (ref 0.0–1.2)
Bilirubin, Direct: 0.14 mg/dL (ref 0.00–0.40)
Total Protein: 6 g/dL (ref 6.0–8.5)

## 2019-07-03 LAB — CBC WITH DIFFERENTIAL/PLATELET
Basophils Absolute: 0.1 10*3/uL (ref 0.0–0.2)
Basos: 1 %
EOS (ABSOLUTE): 0.3 10*3/uL (ref 0.0–0.4)
Eos: 6 %
Hematocrit: 39.7 % (ref 34.0–46.6)
Hemoglobin: 12.8 g/dL (ref 11.1–15.9)
Immature Grans (Abs): 0 10*3/uL (ref 0.0–0.1)
Immature Granulocytes: 0 %
Lymphocytes Absolute: 2.2 10*3/uL (ref 0.7–3.1)
Lymphs: 41 %
MCH: 29.7 pg (ref 26.6–33.0)
MCHC: 32.2 g/dL (ref 31.5–35.7)
MCV: 92 fL (ref 79–97)
Monocytes Absolute: 0.4 10*3/uL (ref 0.1–0.9)
Monocytes: 8 %
Neutrophils Absolute: 2.4 10*3/uL (ref 1.4–7.0)
Neutrophils: 44 %
Platelets: 206 10*3/uL (ref 150–450)
RBC: 4.31 x10E6/uL (ref 3.77–5.28)
RDW: 13.5 % (ref 11.7–15.4)
WBC: 5.3 10*3/uL (ref 3.4–10.8)

## 2019-07-03 LAB — VITAMIN B12: Vitamin B-12: 261 pg/mL (ref 232–1245)

## 2019-07-03 LAB — VITAMIN D 25 HYDROXY (VIT D DEFICIENCY, FRACTURES): Vit D, 25-Hydroxy: 33.9 ng/mL (ref 30.0–100.0)

## 2019-07-03 LAB — LIPID PANEL
Chol/HDL Ratio: 2.6 ratio (ref 0.0–4.4)
Cholesterol, Total: 154 mg/dL (ref 100–199)
HDL: 59 mg/dL (ref >39)
LDL Calculated: 79 mg/dL (ref 0–99)
Triglycerides: 80 mg/dL (ref 0–149)
VLDL Cholesterol Cal: 16 mg/dL (ref 5–40)

## 2019-07-03 LAB — BASIC METABOLIC PANEL
BUN/Creatinine Ratio: 18 (ref 12–28)
BUN: 14 mg/dL (ref 8–27)
CO2: 19 mmol/L — ABNORMAL LOW (ref 20–29)
Calcium: 8.8 mg/dL (ref 8.7–10.3)
Chloride: 111 mmol/L — ABNORMAL HIGH (ref 96–106)
Creatinine, Ser: 0.76 mg/dL (ref 0.57–1.00)
GFR calc Af Amer: 89 mL/min/{1.73_m2} (ref >59)
GFR calc non Af Amer: 78 mL/min/{1.73_m2} (ref >59)
Glucose: 77 mg/dL (ref 65–99)
Potassium: 4.4 mmol/L (ref 3.5–5.2)
Sodium: 143 mmol/L (ref 134–144)

## 2019-07-03 MED ORDER — HYDROCODONE-ACETAMINOPHEN 5-325 MG PO TABS: ORAL_TABLET | ORAL | 0 refills | Status: DC

## 2019-07-03 NOTE — Telephone Encounter (Signed)
Medication pended and pt would [facility]

## 2019-07-03 NOTE — Telephone Encounter (Signed)
Please advise. Thank you

## 2019-07-03 NOTE — Telephone Encounter (Signed)
Pt is checking on status of HYDROcodone-acetaminophen (NORCO/VICODIN) 5-325 MG tablet. Instead of sending in to [facility]

## 2019-07-03 NOTE — Telephone Encounter (Signed)
Ok I cancelled the [facility] , [facility] as [facility] and ill sign

## 2019-07-07 ENCOUNTER — Encounter: Payer: Self-pay | Admitting: Family Medicine

## 2019-08-12 DIAGNOSIS — R69 Illness, unspecified: Secondary | ICD-10-CM | POA: Diagnosis not present

## 2019-08-28 DIAGNOSIS — R69 Illness, unspecified: Secondary | ICD-10-CM | POA: Diagnosis not present

## 2019-08-30 ENCOUNTER — Ambulatory Visit (INDEPENDENT_AMBULATORY_CARE_PROVIDER_SITE_OTHER): Payer: Medicare HMO

## 2019-08-30 ENCOUNTER — Ambulatory Visit: Payer: Self-pay

## 2019-08-30 ENCOUNTER — Encounter: Payer: Self-pay | Admitting: Orthopaedic Surgery

## 2019-08-30 ENCOUNTER — Ambulatory Visit (INDEPENDENT_AMBULATORY_CARE_PROVIDER_SITE_OTHER): Payer: Medicare HMO | Admitting: Orthopaedic Surgery

## 2019-08-30 VITALS — Ht 67.0 in | Wt 172.0 lb

## 2019-08-30 DIAGNOSIS — M1611 Unilateral primary osteoarthritis, right hip: Secondary | ICD-10-CM

## 2019-08-30 NOTE — Progress Notes (Signed)
Office Visit Note   Patient: [name]           Date of Birth: Mar 01, 1946           MRN: [id number] Visit Date: 08/30/2019              Requested by: Mikey Kirschner, [address] [place],  [place] [address] PCP: Mikey Kirschner, MD   Assessment & Plan: Visit Diagnoses:  1. Primary osteoarthritis of right hip     Plan: Impression is end-stage right hip DJD.  Patient is currently working for the [employer] therefore she would like to get it cortisone injection today to get her through the next couple months.  She is looking at having the hip replaced sometime after Christmas or the first of the year.  We will obtain preoperative medical clearance in the meantime.  Overall looks like she is quite healthy and she recently just had a physical with her PCP Dr. Wolfgang Phoenix.  Follow-Up Instructions: Return if symptoms worsen or fail to improve.   Orders:  Orders Placed This Encounter  Procedures  . XR HIP UNILAT W OR W/O PELVIS 2-3 VIEWS RIGHT   No orders of the defined types were placed in this encounter.     Procedures: No procedures performed   Clinical Data: No additional findings.   Subjective: Chief Complaint  Patient presents with  . Right Hip - Pain, Follow-up      [name] comes in today for evaluation of continued right hip pain that has progressively gotten worse.  She states that the pain is worse with walking and prolonged standing.  She is unable to sleep at night.  She is requesting another cortisone injection today.  She is having to limp as a result of the pain.  Denies any back pain.   Review of Systems  Constitutional: Negative.   HENT: Negative.   Eyes: Negative.   Respiratory: Negative.   Cardiovascular: Negative.   Endocrine: Negative.   Musculoskeletal: Negative.   Neurological: Negative.   Hematological: Negative.   Psychiatric/Behavioral: Negative.   All other systems reviewed and are negative.    Objective:  Vital Signs: Ht 5\' 7"  (1.702 m)   Wt 172 lb 0.5 oz (78 kg)   BMI 26.94 kg/m   Physical Exam Vitals signs and nursing note reviewed.  Constitutional:      Appearance: She is well-developed.  HENT:     Head: Normocephalic and atraumatic.  Neck:     Musculoskeletal: Neck supple.  Pulmonary:     Effort: Pulmonary effort is normal.  Abdominal:     Palpations: Abdomen is soft.  Skin:    General: Skin is warm.     Capillary Refill: Capillary refill takes less than 2 seconds.  Neurological:     Mental Status: She is alert and oriented to person, place, and time.  Psychiatric:        Behavior: Behavior normal.        Thought Content: Thought content normal.        Judgment: Judgment normal.     Ortho Exam Right hip exam shows very limited range of motion with significant catching and pain.  Positive Stinchfield sign.  Positive logroll. Specialty Comments:  No specialty comments available.  Imaging: Xr Hip Unilat W Or W/o Pelvis 2-3 Views Right  Result Date: 08/30/2019  Severe right hip DJD with bone-on-bone joint space narrowing and multiple subchondral cysts of the femoral head and acetabulum.  PMFS History: Patient Active Problem List   Diagnosis Date Noted  . Chest pain 12/27/2016  . Hypothyroidism 05/22/2016  . Essential hypertension, benign 09/09/2013  . Hyperlipidemia LDL goal <130 09/09/2013  . Esophageal reflux 09/09/2013   Past Medical History:  Diagnosis Date  . Cancer [facility])    Thyroid cancer  . Hypercholesteremia    resolved after gastric bypass  . Hypertension   . Hypothyroidism     Family History  Problem Relation Age of Onset  . Pneumonia Mother 25  . CAD Father 30  . CAD Sister 66  . CAD Brother 73  . Other Maternal Grandmother 96  . Other Brother 84       drug overdose  . CAD Other 2  . Colon cancer Neg Hx     Past Surgical History:  Procedure Laterality Date  . ABDOMINAL HYSTERECTOMY    . BILATERAL SALPINGOOPHORECTOMY    . BREAST  EXCISIONAL BIOPSY Bilateral 1995   benign  . CHOLECYSTECTOMY    . COLONOSCOPY  11/14/2011   Procedure: COLONOSCOPY;  Surgeon: Daneil Dolin, MD;  Location: [facility];  Service: Endoscopy;  Laterality: N/A;  . REPLACEMENT TOTAL KNEE BILATERAL    . ROUX-EN-Y PROCEDURE  2007  . TONSILLECTOMY    . TOTAL THYROIDECTOMY  12/2010  . TUBAL LIGATION     Social History   Occupational History  . Occupation: Retired Therapist, art  Tobacco Use  . Smoking status: Never Smoker  . Smokeless tobacco: Never Used  Substance and Sexual Activity  . Alcohol use: No  . Drug use: No  . Sexual activity: Not on file

## 2019-08-30 NOTE — Progress Notes (Signed)
mskSubjective: Patient is here for ultrasound-guided intra-articular right hip injection.   This will be her third injection.  She is trying to delay surgery until after November if possible.  Objective: Quite a bit of pain and decreased range of motion with passive internal rotation.  Procedure: Ultrasound-guided right hip injection: After sterile prep with Betadine, injected 8 cc 1% lidocaine without epinephrine and 40 mg methylprednisolone using a 22-gauge spinal needle, passing the needle through the iliofemoral ligament into the femoral head/neck junction.  Injectate was seen filling the joint capsule.  Follow-up as directed.

## 2019-09-19 ENCOUNTER — Other Ambulatory Visit: Payer: Self-pay

## 2019-09-19 ENCOUNTER — Other Ambulatory Visit: Payer: Medicare HMO

## 2019-09-19 ENCOUNTER — Other Ambulatory Visit: Payer: Self-pay | Admitting: *Deleted

## 2019-09-19 ENCOUNTER — Other Ambulatory Visit (INDEPENDENT_AMBULATORY_CARE_PROVIDER_SITE_OTHER): Payer: Medicare HMO

## 2019-09-19 DIAGNOSIS — Z23 Encounter for immunization: Secondary | ICD-10-CM

## 2019-09-19 MED ORDER — HYDROCODONE-ACETAMINOPHEN 5-325 MG PO TABS: ORAL_TABLET | ORAL | 0 refills | Status: DC

## 2019-09-19 MED ORDER — ZOSTER VAC RECOMB ADJUVANTED 50 MCG/0.5ML IM SUSR: 0.5000 mL | Freq: Once | INTRAMUSCULAR | 1 refills | Status: AC

## 2019-09-19 NOTE — Telephone Encounter (Signed)
Pt came in today for a nurse visit for a flu vaccine and she wanted rx for shingrix and a refill on hydrocodone for hip pain. She states her last rx was in January and she only takes it if the pain is killing her. States she needs hip replacement. Last med check was 07/01/19.  [facility] [place] [facility] the [facility] med and [place] [facility] for shingrix rx. ( script printed and at nurse station if you agree)

## 2019-09-19 NOTE — Telephone Encounter (Signed)
May ref same as before same dose, ok on shing

## 2019-10-07 DIAGNOSIS — Z85850 Personal history of malignant neoplasm of thyroid: Secondary | ICD-10-CM | POA: Diagnosis not present

## 2019-10-07 DIAGNOSIS — M810 Age-related osteoporosis without current pathological fracture: Secondary | ICD-10-CM | POA: Diagnosis not present

## 2019-10-07 DIAGNOSIS — E890 Postprocedural hypothyroidism: Secondary | ICD-10-CM | POA: Diagnosis not present

## 2019-10-31 ENCOUNTER — Telehealth: Payer: Self-pay | Admitting: Orthopaedic Surgery

## 2019-10-31 NOTE — Telephone Encounter (Signed)
Patient left a voicemail to advise that she is ready to proceed with the hip replacement surgery.  CB#[phone].  Thank you.

## 2019-10-31 NOTE — Telephone Encounter (Signed)
Please schedule for right THA. Thanks.

## 2019-11-06 NOTE — Telephone Encounter (Signed)
I called patient and scheduled surgery,

## 2019-11-15 ENCOUNTER — Other Ambulatory Visit: Payer: Self-pay

## 2019-12-02 ENCOUNTER — Other Ambulatory Visit: Payer: Self-pay | Admitting: Family Medicine

## 2019-12-02 DIAGNOSIS — Z1231 Encounter for screening mammogram for malignant neoplasm of breast: Secondary | ICD-10-CM

## 2019-12-07 DIAGNOSIS — M1611 Unilateral primary osteoarthritis, right hip: Secondary | ICD-10-CM

## 2019-12-18 NOTE — Progress Notes (Signed)
[facility] [place] [place] [facility] [address] [place] [address] Phone: [phone] Fax: [phone]  [id number] [place] [place] [id number] [place] [address] Phone: [phone] Fax: [phone]      Your procedure is scheduled on Monday, December 23, 2019.  Report to [facility] Main Entrance "A" at 10:30 A.M., and check in at the Admitting office.  Call this number if you have problems the morning of surgery:  [phone]    Remember:  Do not eat after midnight the night before your surgery  You may drink clear liquids until 9:30 AM the morning of your surgery.   Clear liquids allowed are: Water, Non-Citrus Juices (without pulp), Carbonated Beverages, Clear Tea, Black Coffee Only, and Gatorade  Please complete your PRE-SURGERY ENSURE that was provided to you by 9:30 AM the morning of surgery.  Please, if able, drink it in one setting. DO NOT SIP.     Take these medicines the morning of surgery with A SIP OF WATER: HYDROcodone-acetaminophen (NORCO/VICODIN) - if needed levothyroxine (SYNTHROID)  7 days prior to surgery STOP taking any Aspirin (unless otherwise instructed by your surgeon), Aleve, Naproxen, Ibuprofen, Motrin, Advil, Goody's, BC's, all herbal medications, fish oil, and all vitamins.    The Morning of Surgery  Do not wear jewelry, make-up or nail polish.  Do not wear lotions, powders, perfumes, or deodorant  Do not shave 48 hours prior to surgery.  Do not bring valuables to the hospital.  [facility] is not responsible for any belongings or valuables.  If you are a smoker, DO NOT Smoke 24 hours prior to surgery  If you wear a CPAP at night please bring your mask, tubing, and machine the morning of surgery   Remember that you must have someone to transport you home after your surgery, and remain with you for 24 hours if  you are discharged the same day.   Please bring cases for contacts, glasses, hearing aids, dentures or bridgework because it cannot be worn into surgery.    Leave your suitcase in the car.  After surgery it may be brought to your room.  For patients admitted to the hospital, discharge time will be determined by your treatment team.  Patients discharged the day of surgery will not be allowed to drive home.    Special instructions:   [place]- Preparing For Surgery  Before surgery, you can play an important role. Because skin is not sterile, your skin needs to be as free of germs as possible. You can reduce the number of germs on your skin by washing with CHG (chlorahexidine gluconate) Soap before surgery.  CHG is an antiseptic cleaner which kills germs and bonds with the skin to continue killing germs even after washing.    Oral Hygiene is also important to reduce your risk of infection.  Remember - BRUSH YOUR TEETH THE MORNING OF SURGERY WITH YOUR REGULAR TOOTHPASTE  Please do not use if you have an allergy to CHG or antibacterial soaps. If your skin becomes reddened/irritated stop using the CHG.  Do not shave (including legs and underarms) for at least 48 hours prior to first CHG shower. It is OK to shave your face.  Please follow these instructions carefully.   1. Shower the NIGHT BEFORE SURGERY and the MORNING OF SURGERY with CHG Soap.   2. If you chose  to wash your hair, wash your hair first as usual with your normal shampoo.  3. After you shampoo, rinse your hair and body thoroughly to remove the shampoo.  4. Use CHG as you would any other liquid soap. You can apply CHG directly to the skin and wash gently with a scrungie or a clean washcloth.   5. Apply the CHG Soap to your body ONLY FROM THE NECK DOWN.  Do not use on open wounds or open sores. Avoid contact with your eyes, ears, mouth and genitals (private parts). Wash Face and genitals (private parts)  with your normal  soap.   6. Wash thoroughly, paying special attention to the area where your surgery will be performed.  7. Thoroughly rinse your body with warm water from the neck down.  8. DO NOT shower/wash with your normal soap after using and rinsing off the CHG Soap.  9. Pat yourself dry with a CLEAN TOWEL.  10. Wear CLEAN PAJAMAS to bed the night before surgery, wear comfortable clothes the morning of surgery  11. Place CLEAN SHEETS on your bed the night of your first shower and DO NOT SLEEP WITH PETS.    Day of Surgery:  Please shower the morning of surgery with the CHG soap Do not apply any deodorants/lotions. Please wear clean clothes to the hospital/surgery center.   Remember to brush your teeth WITH YOUR REGULAR TOOTHPASTE.   Please read over the following fact sheets that you were given.

## 2019-12-19 ENCOUNTER — Encounter (HOSPITAL_COMMUNITY)
Admission: RE | Admit: 2019-12-19 | Discharge: 2019-12-19 | Disposition: A | Discharge status: Home / Self Care | Payer: Medicare HMO | Source: Ambulatory Visit | Attending: Orthopaedic Surgery | Admitting: Orthopaedic Surgery

## 2019-12-19 ENCOUNTER — Other Ambulatory Visit: Payer: Self-pay

## 2019-12-19 ENCOUNTER — Encounter (HOSPITAL_COMMUNITY): Payer: Self-pay

## 2019-12-19 ENCOUNTER — Other Ambulatory Visit (HOSPITAL_COMMUNITY)
Admission: RE | Admit: 2019-12-19 | Discharge: 2019-12-19 | Disposition: A | Discharge status: Home / Self Care | Payer: Medicare HMO | Source: Ambulatory Visit | Attending: Orthopaedic Surgery | Admitting: Orthopaedic Surgery

## 2019-12-19 ENCOUNTER — Ambulatory Visit (HOSPITAL_COMMUNITY)
Admission: RE | Admit: 2019-12-19 | Discharge: 2019-12-19 | Disposition: A | Discharge status: Home / Self Care | Payer: Medicare HMO | Source: Ambulatory Visit | Attending: Physician Assistant | Admitting: Physician Assistant

## 2019-12-19 DIAGNOSIS — Z20822 Contact with and (suspected) exposure to covid-19: Secondary | ICD-10-CM | POA: Insufficient documentation

## 2019-12-19 DIAGNOSIS — Z01810 Encounter for preprocedural cardiovascular examination: Secondary | ICD-10-CM | POA: Insufficient documentation

## 2019-12-19 DIAGNOSIS — Z01812 Encounter for preprocedural laboratory examination: Secondary | ICD-10-CM | POA: Insufficient documentation

## 2019-12-19 DIAGNOSIS — I10 Essential (primary) hypertension: Secondary | ICD-10-CM | POA: Insufficient documentation

## 2019-12-19 DIAGNOSIS — M1611 Unilateral primary osteoarthritis, right hip: Secondary | ICD-10-CM | POA: Diagnosis not present

## 2019-12-19 DIAGNOSIS — Z471 Aftercare following joint replacement surgery: Secondary | ICD-10-CM | POA: Diagnosis not present

## 2019-12-19 DIAGNOSIS — Z96649 Presence of unspecified artificial hip joint: Secondary | ICD-10-CM | POA: Diagnosis not present

## 2019-12-19 DIAGNOSIS — Z01818 Encounter for other preprocedural examination: Secondary | ICD-10-CM | POA: Diagnosis not present

## 2019-12-19 DIAGNOSIS — R9431 Abnormal electrocardiogram [ECG] [EKG]: Secondary | ICD-10-CM | POA: Insufficient documentation

## 2019-12-19 HISTORY — DX: Personal history of urinary calculi: Z87.442

## 2019-12-19 HISTORY — DX: Gastro-esophageal reflux disease without esophagitis: K21.9

## 2019-12-19 HISTORY — DX: Unspecified osteoarthritis, unspecified site: M19.90

## 2019-12-19 LAB — TYPE AND SCREEN
ABO/RH(D): O POS
Antibody Screen: NEGATIVE

## 2019-12-19 LAB — SURGICAL PCR SCREEN
MRSA, PCR: NEGATIVE
Staphylococcus aureus: NEGATIVE

## 2019-12-19 LAB — CBC WITH DIFFERENTIAL/PLATELET
Abs Immature Granulocytes: 0.02 10*3/uL (ref 0.00–0.07)
Basophils Absolute: 0 10*3/uL (ref 0.0–0.1)
Basophils Relative: 0 %
Eosinophils Absolute: 0.3 10*3/uL (ref 0.0–0.5)
Eosinophils Relative: 5 %
HCT: 39.7 % (ref 36.0–46.0)
Hemoglobin: 12.7 g/dL (ref 12.0–15.0)
Immature Granulocytes: 0 %
Lymphocytes Relative: 36 %
Lymphs Abs: 1.9 10*3/uL (ref 0.7–4.0)
MCH: 29.4 pg (ref 26.0–34.0)
MCHC: 32 g/dL (ref 30.0–36.0)
MCV: 91.9 fL (ref 80.0–100.0)
Monocytes Absolute: 0.4 10*3/uL (ref 0.1–1.0)
Monocytes Relative: 8 %
Neutro Abs: 2.6 10*3/uL (ref 1.7–7.7)
Neutrophils Relative %: 51 %
Platelets: 201 10*3/uL (ref 150–400)
RBC: 4.32 MIL/uL (ref 3.87–5.11)
RDW: 13.4 % (ref 11.5–15.5)
WBC: 5.1 10*3/uL (ref 4.0–10.5)
nRBC: 0 % (ref 0.0–0.2)

## 2019-12-19 LAB — SARS CORONAVIRUS 2 (TAT 6-24 HRS): SARS Coronavirus 2: NEGATIVE

## 2019-12-19 LAB — COMPREHENSIVE METABOLIC PANEL
ALT: 17 U/L (ref 0–44)
AST: 16 U/L (ref 15–41)
Albumin: 3.5 g/dL (ref 3.5–5.0)
Alkaline Phosphatase: 53 U/L (ref 38–126)
Anion gap: 4 — ABNORMAL LOW (ref 5–15)
BUN: 15 mg/dL (ref 8–23)
CO2: 26 mmol/L (ref 22–32)
Calcium: 8.7 mg/dL — ABNORMAL LOW (ref 8.9–10.3)
Chloride: 112 mmol/L — ABNORMAL HIGH (ref 98–111)
Creatinine, Ser: 0.85 mg/dL (ref 0.44–1.00)
GFR calc Af Amer: >60 mL/min (ref >60)
GFR calc non Af Amer: >60 mL/min (ref >60)
Glucose, Bld: 89 mg/dL (ref 70–99)
Potassium: 3.6 mmol/L (ref 3.5–5.1)
Sodium: 142 mmol/L (ref 135–145)
Total Bilirubin: 0.6 mg/dL (ref 0.3–1.2)
Total Protein: 5.9 g/dL — ABNORMAL LOW (ref 6.5–8.1)

## 2019-12-19 LAB — PROTIME-INR
INR: 1 (ref 0.8–1.2)
Prothrombin Time: 13.4 seconds (ref 11.4–15.2)

## 2019-12-19 LAB — APTT: aPTT: 32 seconds (ref 24–36)

## 2019-12-19 LAB — ABO/RH: ABO/RH(D): O POS

## 2019-12-19 NOTE — Progress Notes (Signed)
PCP - [facility] Cardiologist - denies   Chest x-ray - 12-19-19 EKG - 12-19-19 Stress Test - 12-29-16 ECHO - 12-28-16   ERAS Protcol - yes, Ensure ordered and given   COVID TEST- Thursday, 12-19-19   Anesthesia review: n/a  Patient denies shortness of breath, fever, cough and chest pain at PAT appointment   All instructions explained to the patient, with a verbal understanding of the material. Patient agrees to go over the instructions while at home for a better understanding. Patient also instructed to self quarantine after being tested for COVID-19. The opportunity to ask questions was provided.

## 2019-12-20 MED ORDER — TRANEXAMIC ACID 1000 MG/10ML IV SOLN
2000.0000 mg | INTRAVENOUS | Status: DC
  Filled 2019-12-20: qty 20

## 2019-12-20 MED ORDER — TRANEXAMIC ACID-NACL 1000-0.7 MG/100ML-% IV SOLN: 1000.0000 mg | INTRAVENOUS | Status: DC

## 2019-12-22 NOTE — Discharge Instructions (Signed)

## 2019-12-23 ENCOUNTER — Ambulatory Visit (HOSPITAL_COMMUNITY): Payer: Medicare HMO

## 2019-12-23 ENCOUNTER — Encounter (HOSPITAL_COMMUNITY)
Admission: RE | Disposition: A | Discharge status: Home / Self Care | Payer: Self-pay | Source: Home / Self Care | Attending: Orthopaedic Surgery

## 2019-12-23 ENCOUNTER — Observation Stay (HOSPITAL_COMMUNITY)
Admission: RE | Admit: 2019-12-23 | Discharge: 2019-12-24 | Disposition: A | Discharge status: Home / Self Care | Payer: Medicare HMO | Attending: Orthopaedic Surgery | Admitting: Orthopaedic Surgery

## 2019-12-23 ENCOUNTER — Ambulatory Visit (HOSPITAL_COMMUNITY): Payer: Medicare HMO | Admitting: Anesthesiology

## 2019-12-23 ENCOUNTER — Telehealth: Payer: Self-pay

## 2019-12-23 ENCOUNTER — Encounter (HOSPITAL_COMMUNITY): Payer: Self-pay | Admitting: Orthopaedic Surgery

## 2019-12-23 ENCOUNTER — Other Ambulatory Visit: Payer: Self-pay

## 2019-12-23 ENCOUNTER — Observation Stay (HOSPITAL_COMMUNITY): Payer: Medicare HMO

## 2019-12-23 DIAGNOSIS — Z471 Aftercare following joint replacement surgery: Secondary | ICD-10-CM | POA: Diagnosis not present

## 2019-12-23 DIAGNOSIS — Z9884 Bariatric surgery status: Secondary | ICD-10-CM | POA: Insufficient documentation

## 2019-12-23 DIAGNOSIS — Z96641 Presence of right artificial hip joint: Secondary | ICD-10-CM | POA: Diagnosis not present

## 2019-12-23 DIAGNOSIS — Z79890 Hormone replacement therapy (postmenopausal): Secondary | ICD-10-CM | POA: Diagnosis not present

## 2019-12-23 DIAGNOSIS — K219 Gastro-esophageal reflux disease without esophagitis: Secondary | ICD-10-CM | POA: Diagnosis not present

## 2019-12-23 DIAGNOSIS — I10 Essential (primary) hypertension: Secondary | ICD-10-CM | POA: Diagnosis not present

## 2019-12-23 DIAGNOSIS — M1611 Unilateral primary osteoarthritis, right hip: Secondary | ICD-10-CM | POA: Diagnosis not present

## 2019-12-23 DIAGNOSIS — Z419 Encounter for procedure for purposes other than remedying health state, unspecified: Secondary | ICD-10-CM

## 2019-12-23 DIAGNOSIS — Z79899 Other long term (current) drug therapy: Secondary | ICD-10-CM | POA: Insufficient documentation

## 2019-12-23 DIAGNOSIS — Z85850 Personal history of malignant neoplasm of thyroid: Secondary | ICD-10-CM | POA: Insufficient documentation

## 2019-12-23 DIAGNOSIS — E890 Postprocedural hypothyroidism: Secondary | ICD-10-CM | POA: Diagnosis not present

## 2019-12-23 DIAGNOSIS — Z96653 Presence of artificial knee joint, bilateral: Secondary | ICD-10-CM | POA: Diagnosis not present

## 2019-12-23 DIAGNOSIS — Z96649 Presence of unspecified artificial hip joint: Secondary | ICD-10-CM

## 2019-12-23 DIAGNOSIS — E039 Hypothyroidism, unspecified: Secondary | ICD-10-CM | POA: Diagnosis not present

## 2019-12-23 DIAGNOSIS — E785 Hyperlipidemia, unspecified: Secondary | ICD-10-CM | POA: Diagnosis not present

## 2019-12-23 HISTORY — PX: TOTAL HIP ARTHROPLASTY: SHX124

## 2019-12-23 SURGERY — ARTHROPLASTY, HIP, TOTAL, ANTERIOR APPROACH
Anesthesia: Spinal | Site: Hip | Laterality: Right

## 2019-12-23 MED ORDER — CALCIUM CITRATE 950 (200 CA) MG PO TABS
1.0000 | ORAL_TABLET | Freq: Two times a day (BID) | ORAL | Status: DC
  Administered 2019-12-23 – 2019-12-24 (×2): 200 mg via ORAL
  Filled 2019-12-23 (×4): qty 1

## 2019-12-23 MED ORDER — CEFAZOLIN SODIUM-DEXTROSE 2-4 GM/100ML-% IV SOLN
2.0000 g | Freq: Four times a day (QID) | INTRAVENOUS | Status: AC
  Administered 2019-12-23 – 2019-12-24 (×3): 2 g via INTRAVENOUS
  Filled 2019-12-23 (×3): qty 100

## 2019-12-23 MED ORDER — PROPOFOL 10 MG/ML IV BOLUS
INTRAVENOUS | Status: AC
  Filled 2019-12-23: qty 20

## 2019-12-23 MED ORDER — ONDANSETRON HCL 4 MG/2ML IJ SOLN: 4.0000 mg | Freq: Once | INTRAMUSCULAR | Status: DC | PRN

## 2019-12-23 MED ORDER — BUPIVACAINE HCL (PF) 0.25 % IJ SOLN
INTRAMUSCULAR | Status: DC | PRN
  Administered 2019-12-23: 20 mL

## 2019-12-23 MED ORDER — LACTATED RINGERS IV SOLN: INTRAVENOUS | Status: DC

## 2019-12-23 MED ORDER — TRANEXAMIC ACID-NACL 1000-0.7 MG/100ML-% IV SOLN
INTRAVENOUS | Status: AC
  Filled 2019-12-23: qty 100

## 2019-12-23 MED ORDER — HYDROMORPHONE HCL 1 MG/ML IJ SOLN
0.5000 mg | INTRAMUSCULAR | Status: DC | PRN
  Administered 2019-12-23: 1 mg via INTRAVENOUS
  Filled 2019-12-23: qty 1

## 2019-12-23 MED ORDER — LACTATED RINGERS IV SOLN: INTRAVENOUS | Status: DC | PRN

## 2019-12-23 MED ORDER — SODIUM CHLORIDE 0.9 % IR SOLN
Status: DC | PRN
  Administered 2019-12-23: 3000 mL

## 2019-12-23 MED ORDER — POVIDONE-IODINE 10 % EX SWAB: 2.0000 "application " | Freq: Once | CUTANEOUS | Status: DC

## 2019-12-23 MED ORDER — SODIUM CHLORIDE (PF) 0.9 % IJ SOLN
INTRAMUSCULAR | Status: DC | PRN
  Administered 2019-12-23 (×2): 10 mL

## 2019-12-23 MED ORDER — DEXAMETHASONE SODIUM PHOSPHATE 10 MG/ML IJ SOLN
10.0000 mg | Freq: Once | INTRAMUSCULAR | Status: AC
  Administered 2019-12-24: 10:00:00 10 mg via INTRAVENOUS
  Filled 2019-12-23: qty 1

## 2019-12-23 MED ORDER — VANCOMYCIN HCL 1000 MG IV SOLR
INTRAVENOUS | Status: AC
  Filled 2019-12-23: qty 1000

## 2019-12-23 MED ORDER — ONDANSETRON HCL 4 MG PO TABS: 4.0000 mg | ORAL_TABLET | Freq: Four times a day (QID) | ORAL | Status: DC | PRN

## 2019-12-23 MED ORDER — METOCLOPRAMIDE HCL 5 MG/ML IJ SOLN: 5.0000 mg | Freq: Three times a day (TID) | INTRAMUSCULAR | Status: DC | PRN

## 2019-12-23 MED ORDER — SODIUM CHLORIDE 0.9 % IV SOLN: INTRAVENOUS | Status: DC

## 2019-12-23 MED ORDER — PROPOFOL 500 MG/50ML IV EMUL
INTRAVENOUS | Status: DC | PRN
  Administered 2019-12-23: 50 ug/kg/min via INTRAVENOUS

## 2019-12-23 MED ORDER — ONDANSETRON HCL 4 MG PO TABS: 4.0000 mg | ORAL_TABLET | Freq: Three times a day (TID) | ORAL | 0 refills | Status: DC | PRN

## 2019-12-23 MED ORDER — FAMOTIDINE 20 MG PO TABS
40.0000 mg | ORAL_TABLET | Freq: Every day | ORAL | Status: DC
  Administered 2019-12-23 – 2019-12-24 (×2): 40 mg via ORAL
  Filled 2019-12-23 (×3): qty 2

## 2019-12-23 MED ORDER — VALACYCLOVIR HCL 500 MG PO TABS
1000.0000 mg | ORAL_TABLET | Freq: Two times a day (BID) | ORAL | Status: DC | PRN
  Filled 2019-12-23: qty 2

## 2019-12-23 MED ORDER — CEFAZOLIN SODIUM-DEXTROSE 2-4 GM/100ML-% IV SOLN: 2.0000 g | INTRAVENOUS | Status: DC

## 2019-12-23 MED ORDER — CEFAZOLIN SODIUM-DEXTROSE 2-4 GM/100ML-% IV SOLN
INTRAVENOUS | Status: AC
  Filled 2019-12-23: qty 100

## 2019-12-23 MED ORDER — MAGNESIUM CITRATE PO SOLN: 1.0000 | Freq: Once | ORAL | Status: DC | PRN

## 2019-12-23 MED ORDER — PHENYLEPHRINE HCL-NACL 10-0.9 MG/250ML-% IV SOLN
INTRAVENOUS | Status: DC | PRN
  Administered 2019-12-23: 50 ug/min via INTRAVENOUS

## 2019-12-23 MED ORDER — ASPIRIN EC 81 MG PO TBEC: 81.0000 mg | DELAYED_RELEASE_TABLET | Freq: Two times a day (BID) | ORAL | 0 refills | Status: DC

## 2019-12-23 MED ORDER — OXYCODONE HCL ER 10 MG PO T12A: 10.0000 mg | EXTENDED_RELEASE_TABLET | Freq: Two times a day (BID) | ORAL | 0 refills | Status: AC

## 2019-12-23 MED ORDER — TRANEXAMIC ACID 1000 MG/10ML IV SOLN
2000.0000 mg | INTRAVENOUS | Status: AC
  Administered 2019-12-23: 2000 mg via TOPICAL

## 2019-12-23 MED ORDER — METOCLOPRAMIDE HCL 5 MG PO TABS: 5.0000 mg | ORAL_TABLET | Freq: Three times a day (TID) | ORAL | Status: DC | PRN

## 2019-12-23 MED ORDER — CHLORHEXIDINE GLUCONATE 4 % EX LIQD: 60.0000 mL | Freq: Once | CUTANEOUS | Status: DC

## 2019-12-23 MED ORDER — ACETAMINOPHEN 500 MG PO TABS
1000.0000 mg | ORAL_TABLET | Freq: Four times a day (QID) | ORAL | Status: AC
  Administered 2019-12-23 – 2019-12-24 (×4): 1000 mg via ORAL
  Filled 2019-12-23 (×4): qty 2

## 2019-12-23 MED ORDER — FENTANYL CITRATE (PF) 100 MCG/2ML IJ SOLN
INTRAMUSCULAR | Status: AC
  Administered 2019-12-23: 50 ug via INTRAVENOUS
  Filled 2019-12-23: qty 2

## 2019-12-23 MED ORDER — STERILE WATER FOR IRRIGATION IR SOLN
Status: DC | PRN
  Administered 2019-12-23: 1000 mL

## 2019-12-23 MED ORDER — KETOROLAC TROMETHAMINE 15 MG/ML IJ SOLN
15.0000 mg | Freq: Four times a day (QID) | INTRAMUSCULAR | Status: AC
  Administered 2019-12-23 – 2019-12-24 (×4): 15 mg via INTRAVENOUS
  Filled 2019-12-23 (×4): qty 1

## 2019-12-23 MED ORDER — HYDROCODONE-ACETAMINOPHEN 5-325 MG PO TABS: 1.0000 | ORAL_TABLET | Freq: Four times a day (QID) | ORAL | Status: DC | PRN

## 2019-12-23 MED ORDER — OXYCODONE HCL 5 MG PO TABS: 10.0000 mg | ORAL_TABLET | ORAL | Status: DC | PRN

## 2019-12-23 MED ORDER — DIPHENHYDRAMINE HCL 12.5 MG/5ML PO ELIX
25.0000 mg | ORAL_SOLUTION | ORAL | Status: DC | PRN
  Filled 2019-12-23: qty 10

## 2019-12-23 MED ORDER — MELATONIN 3 MG PO TABS
18.0000 mg | ORAL_TABLET | Freq: Every day | ORAL | Status: DC
  Filled 2019-12-23 (×3): qty 6

## 2019-12-23 MED ORDER — BUPIVACAINE IN DEXTROSE 0.75-8.25 % IT SOLN
INTRATHECAL | Status: DC | PRN
  Administered 2019-12-23: 1.8 mL via INTRATHECAL

## 2019-12-23 MED ORDER — OXYCODONE HCL 5 MG PO TABS: 5.0000 mg | ORAL_TABLET | Freq: Three times a day (TID) | ORAL | 0 refills | Status: DC | PRN

## 2019-12-23 MED ORDER — ACETAMINOPHEN 500 MG PO TABS: 1000.0000 mg | ORAL_TABLET | Freq: Once | ORAL | Status: AC

## 2019-12-23 MED ORDER — OXYCODONE HCL 5 MG PO TABS: 5.0000 mg | ORAL_TABLET | ORAL | Status: DC | PRN

## 2019-12-23 MED ORDER — MIDAZOLAM HCL 2 MG/2ML IJ SOLN
INTRAMUSCULAR | Status: AC
  Filled 2019-12-23: qty 2

## 2019-12-23 MED ORDER — GABAPENTIN 300 MG PO CAPS
300.0000 mg | ORAL_CAPSULE | Freq: Three times a day (TID) | ORAL | Status: DC
  Administered 2019-12-23 – 2019-12-24 (×3): 300 mg via ORAL
  Filled 2019-12-23 (×3): qty 1

## 2019-12-23 MED ORDER — MIDAZOLAM HCL 2 MG/2ML IJ SOLN
INTRAMUSCULAR | Status: DC | PRN
  Administered 2019-12-23: 1 mg via INTRAVENOUS

## 2019-12-23 MED ORDER — TRANEXAMIC ACID 1000 MG/10ML IV SOLN: INTRAVENOUS | Status: DC | PRN

## 2019-12-23 MED ORDER — EPHEDRINE SULFATE 50 MG/ML IJ SOLN
INTRAMUSCULAR | Status: DC | PRN
  Administered 2019-12-23: 10 mg via INTRAVENOUS

## 2019-12-23 MED ORDER — CEFAZOLIN SODIUM-DEXTROSE 2-3 GM-%(50ML) IV SOLR
INTRAVENOUS | Status: DC | PRN
  Administered 2019-12-23: 2 g via INTRAVENOUS

## 2019-12-23 MED ORDER — ENALAPRIL MALEATE 5 MG PO TABS
5.0000 mg | ORAL_TABLET | Freq: Every day | ORAL | Status: DC
  Filled 2019-12-23 (×2): qty 1

## 2019-12-23 MED ORDER — VITAMIN D 25 MCG (1000 UNIT) PO TABS
2000.0000 [IU] | ORAL_TABLET | Freq: Three times a day (TID) | ORAL | Status: DC
  Administered 2019-12-23 – 2019-12-24 (×2): 2000 [IU] via ORAL
  Filled 2019-12-23 (×3): qty 2

## 2019-12-23 MED ORDER — MENTHOL 3 MG MT LOZG: 1.0000 | LOZENGE | OROMUCOSAL | Status: DC | PRN

## 2019-12-23 MED ORDER — POLYETHYLENE GLYCOL 3350 17 G PO PACK: 17.0000 g | PACK | Freq: Every day | ORAL | Status: DC | PRN

## 2019-12-23 MED ORDER — VANCOMYCIN HCL 1 G IV SOLR
INTRAVENOUS | Status: DC | PRN
  Administered 2019-12-23: 1000 mg

## 2019-12-23 MED ORDER — FOLIC ACID 1 MG PO TABS
1.0000 mg | ORAL_TABLET | Freq: Every day | ORAL | Status: DC
  Administered 2019-12-23 – 2019-12-24 (×2): 1 mg via ORAL
  Filled 2019-12-23 (×2): qty 1

## 2019-12-23 MED ORDER — TRANEXAMIC ACID-NACL 1000-0.7 MG/100ML-% IV SOLN
1000.0000 mg | Freq: Once | INTRAVENOUS | Status: AC
  Administered 2019-12-23: 1000 mg via INTRAVENOUS
  Filled 2019-12-23 (×2): qty 100

## 2019-12-23 MED ORDER — ASPIRIN 81 MG PO CHEW
81.0000 mg | CHEWABLE_TABLET | Freq: Two times a day (BID) | ORAL | Status: DC
  Administered 2019-12-24: 81 mg via ORAL
  Filled 2019-12-23: qty 1

## 2019-12-23 MED ORDER — BUPIVACAINE HCL (PF) 0.25 % IJ SOLN
INTRAMUSCULAR | Status: AC
  Filled 2019-12-23: qty 30

## 2019-12-23 MED ORDER — LEVOTHYROXINE SODIUM 75 MCG PO TABS
150.0000 ug | ORAL_TABLET | Freq: Every day | ORAL | Status: DC
  Administered 2019-12-24: 150 ug via ORAL
  Filled 2019-12-23: qty 2

## 2019-12-23 MED ORDER — PHENOL 1.4 % MT LIQD: 1.0000 | OROMUCOSAL | Status: DC | PRN

## 2019-12-23 MED ORDER — FENTANYL CITRATE (PF) 100 MCG/2ML IJ SOLN
25.0000 ug | INTRAMUSCULAR | Status: DC | PRN
  Administered 2019-12-23: 50 ug via INTRAVENOUS

## 2019-12-23 MED ORDER — FENTANYL CITRATE (PF) 250 MCG/5ML IJ SOLN
INTRAMUSCULAR | Status: AC
  Filled 2019-12-23: qty 5

## 2019-12-23 MED ORDER — DOCUSATE SODIUM 100 MG PO CAPS
100.0000 mg | ORAL_CAPSULE | Freq: Two times a day (BID) | ORAL | Status: DC
  Administered 2019-12-23 – 2019-12-24 (×3): 100 mg via ORAL
  Filled 2019-12-23 (×3): qty 1

## 2019-12-23 MED ORDER — ONDANSETRON HCL 4 MG/2ML IJ SOLN: 4.0000 mg | Freq: Four times a day (QID) | INTRAMUSCULAR | Status: DC | PRN

## 2019-12-23 MED ORDER — 0.9 % SODIUM CHLORIDE (POUR BTL) OPTIME
TOPICAL | Status: DC | PRN
  Administered 2019-12-23: 1000 mL

## 2019-12-23 MED ORDER — ACETAMINOPHEN 500 MG PO TABS
ORAL_TABLET | ORAL | Status: AC
  Administered 2019-12-23: 1000 mg via ORAL
  Filled 2019-12-23: qty 2

## 2019-12-23 MED ORDER — ADULT MULTIVITAMIN W/MINERALS CH
1.0000 | ORAL_TABLET | Freq: Two times a day (BID) | ORAL | Status: DC
  Administered 2019-12-23 – 2019-12-24 (×2): 1 via ORAL
  Filled 2019-12-23 (×2): qty 1

## 2019-12-23 MED ORDER — METHOCARBAMOL 750 MG PO TABS: 750.0000 mg | ORAL_TABLET | Freq: Two times a day (BID) | ORAL | 3 refills | Status: DC | PRN

## 2019-12-23 MED ORDER — OXYCODONE HCL ER 10 MG PO T12A: 10.0000 mg | EXTENDED_RELEASE_TABLET | Freq: Two times a day (BID) | ORAL | Status: DC

## 2019-12-23 MED ORDER — ALUM & MAG HYDROXIDE-SIMETH 200-200-20 MG/5ML PO SUSP: 30.0000 mL | ORAL | Status: DC | PRN

## 2019-12-23 MED ORDER — PROPOFOL 10 MG/ML IV BOLUS
INTRAVENOUS | Status: DC | PRN
  Administered 2019-12-23: 20 mg via INTRAVENOUS

## 2019-12-23 MED ORDER — SORBITOL 70 % SOLN
30.0000 mL | Freq: Every day | Status: DC | PRN
  Filled 2019-12-23: qty 30

## 2019-12-23 MED ORDER — ONDANSETRON HCL 4 MG/2ML IJ SOLN
INTRAMUSCULAR | Status: DC | PRN
  Administered 2019-12-23: 4 mg via INTRAVENOUS

## 2019-12-23 MED ORDER — METHOCARBAMOL 750 MG PO TABS
750.0000 mg | ORAL_TABLET | Freq: Four times a day (QID) | ORAL | Status: DC | PRN
  Administered 2019-12-23: 750 mg via ORAL
  Filled 2019-12-23: qty 1

## 2019-12-23 MED ORDER — CYANOCOBALAMIN 500 MCG PO TABS
500.0000 ug | ORAL_TABLET | Freq: Every day | ORAL | Status: DC
  Administered 2019-12-23 – 2019-12-24 (×2): 500 ug via ORAL
  Filled 2019-12-23 (×2): qty 1

## 2019-12-23 MED ORDER — BUPIVACAINE LIPOSOME 1.3 % IJ SUSP
20.0000 mL | INTRAMUSCULAR | Status: AC
  Administered 2019-12-23: 20 mL
  Filled 2019-12-23: qty 20

## 2019-12-23 MED ORDER — FERROUS SULFATE 325 (65 FE) MG PO TABS
325.0000 mg | ORAL_TABLET | Freq: Every day | ORAL | Status: DC
  Administered 2019-12-24: 325 mg via ORAL
  Filled 2019-12-23: qty 1

## 2019-12-23 MED ORDER — ACETAMINOPHEN 325 MG PO TABS: 325.0000 mg | ORAL_TABLET | Freq: Four times a day (QID) | ORAL | Status: DC | PRN

## 2019-12-23 MED ORDER — METHOCARBAMOL 1000 MG/10ML IJ SOLN
500.0000 mg | Freq: Four times a day (QID) | INTRAVENOUS | Status: DC | PRN
  Filled 2019-12-23: qty 5

## 2019-12-23 MED ORDER — DEXAMETHASONE SODIUM PHOSPHATE 10 MG/ML IJ SOLN
INTRAMUSCULAR | Status: DC | PRN
  Administered 2019-12-23: 5 mg via INTRAVENOUS

## 2019-12-23 MED ORDER — FENTANYL CITRATE (PF) 100 MCG/2ML IJ SOLN
INTRAMUSCULAR | Status: DC | PRN
  Administered 2019-12-23: 25 ug via INTRAVENOUS
  Administered 2019-12-23: 50 ug via INTRAVENOUS
  Administered 2019-12-23: 25 ug via INTRAVENOUS
  Administered 2019-12-23: 50 ug via INTRAVENOUS

## 2019-12-23 SURGICAL SUPPLY — 61 items
BAG DECANTER FOR FLEXI CONT (MISCELLANEOUS) ×2 IMPLANT
CELLS DAT CNTRL 66122 CELL SVR (MISCELLANEOUS) IMPLANT
COVER PERINEAL POST (MISCELLANEOUS) ×2 IMPLANT
COVER SURGICAL LIGHT HANDLE (MISCELLANEOUS) ×2 IMPLANT
COVER WAND RF STERILE (DRAPES) ×2 IMPLANT
CUP ACET PNNCL SECTR W/GRIP 56 (Hips) ×1 IMPLANT
DERMABOND ADVANCED (GAUZE/BANDAGES/DRESSINGS) ×1
DERMABOND ADVANCED .7 DNX12 (GAUZE/BANDAGES/DRESSINGS) ×1 IMPLANT
DRAPE C-ARM 42X72 X-RAY (DRAPES) ×2 IMPLANT
DRAPE POUCH INSTRU U-SHP 10X18 (DRAPES) ×2 IMPLANT
DRAPE STERI IOBAN 125X83 (DRAPES) ×2 IMPLANT
DRAPE U-SHAPE 47X51 STRL (DRAPES) ×4 IMPLANT
DRSG AQUACEL AG ADV 3.5X10 (GAUZE/BANDAGES/DRESSINGS) ×2 IMPLANT
DURAPREP 26ML APPLICATOR (WOUND CARE) ×4 IMPLANT
ELECT BLADE 4.0 EZ CLEAN MEGAD (MISCELLANEOUS) ×2
ELECT REM PT RETURN 9FT ADLT (ELECTROSURGICAL) ×2
ELECTRODE BLDE 4.0 EZ CLN MEGD (MISCELLANEOUS) ×1 IMPLANT
ELECTRODE REM PT RTRN 9FT ADLT (ELECTROSURGICAL) ×1 IMPLANT
GLOVE BIOGEL PI IND STRL 7.0 (GLOVE) ×1 IMPLANT
GLOVE BIOGEL PI INDICATOR 7.0 (GLOVE) ×1
GLOVE ECLIPSE 7.0 STRL STRAW (GLOVE) ×4 IMPLANT
GLOVE SKINSENSE NS SZ7.5 (GLOVE) ×1
GLOVE SKINSENSE STRL SZ7.5 (GLOVE) ×1 IMPLANT
GLOVE SURG SYN 7.5  E (GLOVE) ×4
GLOVE SURG SYN 7.5 E (GLOVE) ×4 IMPLANT
GOWN STRL REIN XL XLG (GOWN DISPOSABLE) ×2 IMPLANT
GOWN STRL REUS W/ TWL LRG LVL3 (GOWN DISPOSABLE) IMPLANT
GOWN STRL REUS W/ TWL XL LVL3 (GOWN DISPOSABLE) ×1 IMPLANT
GOWN STRL REUS W/TWL LRG LVL3 (GOWN DISPOSABLE)
GOWN STRL REUS W/TWL XL LVL3 (GOWN DISPOSABLE) ×1
HANDPIECE INTERPULSE COAX TIP (DISPOSABLE) ×1
HEAD CERAMIC 36 PLUS5 (Hips) ×2 IMPLANT
HOOD PEEL AWAY FLYTE STAYCOOL (MISCELLANEOUS) ×4 IMPLANT
IV NS IRRIG 3000ML ARTHROMATIC (IV SOLUTION) ×2 IMPLANT
KIT BASIN OR (CUSTOM PROCEDURE TRAY) ×2 IMPLANT
MARKER SKIN DUAL TIP RULER LAB (MISCELLANEOUS) ×2 IMPLANT
NEEDLE SPNL 18GX3.5 QUINCKE PK (NEEDLE) ×2 IMPLANT
PACK TOTAL JOINT (CUSTOM PROCEDURE TRAY) ×2 IMPLANT
PACK UNIVERSAL I (CUSTOM PROCEDURE TRAY) ×2 IMPLANT
PINN SECTOR W/GRIP ACE CUP 56 (Hips) ×2 IMPLANT
PINNACLE ALTRX PLUS 4 N 36X56 (Hips) ×2 IMPLANT
RTRCTR WOUND ALEXIS 18CM MED (MISCELLANEOUS)
SAW OSC TIP CART 19.5X105X1.3 (SAW) ×2 IMPLANT
SCREW 6.5MMX25MM (Screw) ×2 IMPLANT
SET HNDPC FAN SPRY TIP SCT (DISPOSABLE) ×1 IMPLANT
STAPLER VISISTAT 35W (STAPLE) IMPLANT
STEM FEMORAL SZ6 HIGH ACTIS (Stem) ×2 IMPLANT
SUT ETHIBOND 2 V 37 (SUTURE) ×2 IMPLANT
SUT ETHILON 3 0 PS 1 (SUTURE) ×8 IMPLANT
SUT VIC AB 0 CT1 27 (SUTURE) ×1
SUT VIC AB 0 CT1 27XBRD ANBCTR (SUTURE) ×1 IMPLANT
SUT VIC AB 1 CTX 36 (SUTURE) ×2
SUT VIC AB 1 CTX36XBRD ANBCTR (SUTURE) ×2 IMPLANT
SUT VIC AB 2-0 CT1 27 (SUTURE) ×3
SUT VIC AB 2-0 CT1 TAPERPNT 27 (SUTURE) ×3 IMPLANT
SYR 50ML LL SCALE MARK (SYRINGE) ×2 IMPLANT
TOWEL GREEN STERILE (TOWEL DISPOSABLE) ×2 IMPLANT
TRAY CATH 16FR W/PLASTIC CATH (SET/KITS/TRAYS/PACK) IMPLANT
TRAY FOLEY W/BAG SLVR 16FR (SET/KITS/TRAYS/PACK) ×1
TRAY FOLEY W/BAG SLVR 16FR ST (SET/KITS/TRAYS/PACK) ×1 IMPLANT
YANKAUER SUCT BULB TIP NO VENT (SUCTIONS) ×2 IMPLANT

## 2019-12-23 NOTE — Anesthesia Procedure Notes (Signed)
Procedure Name: MAC Date/Time: 12/23/2019 12:05 PM Performed by: Neldon Newport, CRNA Pre-anesthesia Checklist: Emergency Drugs available, Patient identified, Suction available, Timeout performed and Patient being monitored Oxygen Delivery Method: Simple face mask

## 2019-12-23 NOTE — Anesthesia Postprocedure Evaluation (Signed)
Anesthesia Post Note  Patient: [name]  Procedure(s) Performed: RIGHT TOTAL HIP ARTHROPLASTY ANTERIOR APPROACH (Right Hip)     Patient location during evaluation: PACU Anesthesia Type: Spinal Level of consciousness: awake and alert Pain management: pain level controlled Vital Signs Assessment: post-procedure vital signs reviewed and stable Respiratory status: spontaneous breathing, nonlabored ventilation, respiratory function stable and patient connected to nasal cannula oxygen Cardiovascular status: stable and blood pressure returned to baseline Postop Assessment: no apparent nausea or vomiting and spinal receding Anesthetic complications: no    Last Vitals:  Vitals:   12/23/19 1500 12/23/19 1523  BP: (!) 148/73 (!) 158/85  Pulse: 64 60  Resp: 12 18  Temp:  36.5 C  SpO2: 100% 100%    Last Pain:  Vitals:   12/23/19 1523  TempSrc: Oral  PainSc:                  [name] Amardeep Beckers

## 2019-12-23 NOTE — Anesthesia Procedure Notes (Signed)
Spinal  Patient location during procedure: OR Start time: 12/23/2019 12:15 PM End time: 12/23/2019 12:20 PM Staffing Performed: anesthesiologist  Anesthesiologist: Murvin Natal, MD Preanesthetic Checklist Completed: patient identified, IV checked, risks and benefits discussed, surgical consent, monitors and equipment checked, pre-op evaluation and timeout performed Spinal Block Patient position: sitting Prep: DuraPrep Patient monitoring: cardiac monitor, continuous pulse ox and blood pressure Approach: midline Location: L3-4 Injection technique: single-shot Needle Needle type: Pencan  Needle gauge: 24 G Needle length: 9 cm Assessment Sensory level: T10 Additional Notes Functioning IV was confirmed and monitors were applied. Sterile prep and drape, including hand hygiene and sterile gloves were used. The patient was positioned and the spine was prepped. The skin was anesthetized with lidocaine.  Free flow of clear CSF was obtained prior to injecting local anesthetic into the CSF.  The spinal needle aspirated freely following injection.  The needle was carefully withdrawn.  The patient tolerated the procedure well.

## 2019-12-23 NOTE — Op Note (Signed)
RIGHT TOTAL HIP ARTHROPLASTY ANTERIOR APPROACH  Procedure Note SHITAL DEWAARD   [id number]  Pre-op Diagnosis: right hip degenerative joint disease     Post-op Diagnosis: same   Operative Procedures  1. Total hip replacement; Right hip; uncemented [id number]   Personnel  Surgeon(s): Leandrew Koyanagi, MD  Assist: Madalyn Rob, PA-C; necessary for the timely completion of procedure and due to complexity of procedure.   Anesthesia: spinal  Prosthesis: Depuy Acetabulum: Pinnacle 56 mm Femur: Actis 6 STD Head: 36 mm size: +5 Liner: +4 neutral Bearing Type: ceramic on poly  Total Hip Arthroplasty (Anterior Approach) Op Note:  After informed consent was obtained and the operative extremity marked in the holding area, the patient was brought back to the operating room and placed supine on the HANA table. Next, the operative extremity was prepped and draped in normal sterile fashion. Surgical timeout occurred verifying patient identification, surgical site, surgical procedure and administration of antibiotics.  A modified anterior Smith-Peterson approach to the hip was performed, using the interval between tensor fascia lata and sartorius.  Dissection was carried bluntly down onto the anterior hip capsule. The lateral femoral circumflex vessels were identified and coagulated. A capsulotomy was performed and the capsular flaps tagged for later repair.  The neck osteotomy was performed. The femoral head was removed, the acetabular rim was cleared of soft tissue and attention was turned to reaming the acetabulum.  Sequential reaming was performed under fluoroscopic guidance. We reamed to a size 55 mm, and then impacted the acetabular shell. A 25 mm cancellous screw was placed for extra fixation.  This had good purchase.  The liner was then placed after irrigation and attention turned to the femur.  After placing the femoral hook, the leg was taken to externally rotated, extended and  adducted position taking care to perform soft tissue releases to allow for adequate mobilization of the femur. Soft tissue was cleared from the shoulder of the greater trochanter and the hook elevator used to improve exposure of the proximal femur. Sequential broaching performed up to a size 6. Trial neck and head were placed. The leg was brought back up to neutral and the construct reduced. The position and sizing of components, offset and leg lengths were checked using fluoroscopy. Stability of the construct was checked in extension and external rotation without any subluxation or impingement of prosthesis. We dislocated the prosthesis, dropped the leg back into position, removed trial components, and irrigated copiously. The final stem and head was then placed, the leg brought back up, the system reduced and fluoroscopy used to verify positioning.  We irrigated, obtained hemostasis and closed the capsule using #2 ethibond suture.  One gram of vancomycin powder was placed in the surgical bed. The fascia was closed with #1 vicryl plus, the deep fat layer was closed with 0 vicryl, the subcutaneous layers closed with 2.0 Vicryl Plus and the skin closed with 2.0 nylon and dermabond. A sterile dressing was applied. The patient was awakened in the operating room and taken to recovery in stable condition.  All sponge, needle, and instrument counts were correct at the end of the case.   Position: supine  Complications: see description of procedure.  Time Out: performed   Drains/Packing: none  Estimated blood loss: see anesthesia record  Returned to Recovery Room: in good condition.   Antibiotics: yes   Mechanical VTE (DVT) Prophylaxis: sequential compression devices, TED thigh-high  Chemical VTE (DVT) Prophylaxis: aspirin   Fluid Replacement: see anesthesia  record  Specimens Removed: 1 to pathology   Sponge and Instrument Count Correct? yes   PACU: portable radiograph - low AP   Plan/RTC: Return  in 2 weeks for staple removal. Weight Bearing/Load Lower Extremity: full  Hip precautions: none Suture Removal: 2 weeks   N. Eduard Roux, MD [facility] 1:43 PM   Implant Name Type Inv. Item Serial No. Manufacturer Lot No. LRB No. Used Action  PINN SECTOR W/GRIP ACE CUP 56 [id number] Hips PINN SECTOR W/GRIP ACE CUP 56  DEPUY SYNTHES [id number] Right 1 Implanted  PINNACLE ALTRX PLUS 4 N 36X56 - [id number] Hips PINNACLE ALTRX PLUS 4 N 36X56  DEPUY SYNTHES [id number] Right 1 Implanted  SCREW 6.5MMX25MM - [id number] Screw SCREW 6.5MMX25MM  DEPUY SYNTHES [id number] Right 1 Implanted  STEM FEMORAL SZ6 HIGH ACTIS - [id number] Stem STEM FEMORAL SZ6 HIGH ACTIS  [employer] DE[employer] SPINE [id number] Right 1 Implanted  HEAD CERAMIC 36 PLUS5 - [id number] Hips HEAD CERAMIC 36 PLUS5  DEPUY SYNTHES [id number] Right 1 Implanted  HEAD CERAMIC 36 PLUS5 - [id number] Hips HEAD CERAMIC 36 PLUS5  DEPUY SYNTHES [id number] Right 1 Implanted

## 2019-12-23 NOTE — Telephone Encounter (Signed)
[name] states patients in is out of network with [facility]. He states hospital should set up [name] with another agency that's in network.- S/P RIGHT THA 12/23/2019

## 2019-12-23 NOTE — Anesthesia Preprocedure Evaluation (Addendum)
Anesthesia Evaluation  Patient identified by MRN, date of birth, ID band Patient awake    Reviewed: Allergy & Precautions, NPO status , Patient's Chart, lab work & pertinent test results  Airway Mallampati: II  TM Distance: >3 FB Neck ROM: Full    Dental  (+) Missing   Pulmonary neg pulmonary ROS,    Pulmonary exam normal breath sounds clear to auscultation       Cardiovascular hypertension, Pt. on medications Normal cardiovascular exam Rhythm:Regular Rate:Normal  ECG: NSR, rate 61   Neuro/Psych negative neurological ROS  negative psych ROS   GI/Hepatic GERD  Medicated and Controlled,(+)     substance abuse  ,   Endo/Other  Hypothyroidism   Renal/GU negative Renal ROS     Musculoskeletal  (+) Arthritis , narcotic dependent  Abdominal   Peds  Hematology HLD   Anesthesia Other Findings right hip degenerative joint disease  Reproductive/Obstetrics                            Anesthesia Physical Anesthesia Plan  ASA: II  Anesthesia Plan: Spinal   Post-op Pain Management:    Induction: Intravenous  PONV Risk Score and Plan: 2 and Ondansetron, Dexamethasone and Treatment may vary due to age or medical condition  Airway Management Planned: Nasal Cannula  Additional Equipment:   Intra-op Plan:   Post-operative Plan:   Informed Consent: I have reviewed the patients History and Physical, chart, labs and discussed the procedure including the risks, benefits and alternatives for the proposed anesthesia with the patient or authorized representative who has indicated his/her understanding and acceptance.     Dental advisory given  Plan Discussed with: CRNA  Anesthesia Plan Comments:         Anesthesia Quick Evaluation

## 2019-12-23 NOTE — H&P (Signed)
PREOPERATIVE H&P  Chief Complaint: right hip degenerative joint disease  HPI: [name] is a 74 y.o. female who presents for surgical treatment of right hip degenerative joint disease.  She denies any changes in medical history.  Past Medical History:  Diagnosis Date  . Arthritis   . Cancer [facility])    Thyroid cancer  . GERD (gastroesophageal reflux disease)   . History of kidney stones   . Hypercholesteremia    resolved after gastric bypass  . Hypertension   . Hypothyroidism    Past Surgical History:  Procedure Laterality Date  . ABDOMINAL HYSTERECTOMY    . BILATERAL SALPINGOOPHORECTOMY    . BREAST EXCISIONAL BIOPSY Bilateral 1995   benign  . CHOLECYSTECTOMY    . COLONOSCOPY  11/14/2011   Procedure: COLONOSCOPY;  Surgeon: Daneil Dolin, MD;  Location: [facility];  Service: Endoscopy;  Laterality: N/A;  . REPLACEMENT TOTAL KNEE BILATERAL    . ROUX-EN-Y PROCEDURE  2007  . TONSILLECTOMY    . TOTAL THYROIDECTOMY  12/2010  . TUBAL LIGATION     Social History   Socioeconomic History  . Marital status: Widowed    Spouse name: Not on file  . Number of children: Not on file  . Years of education: Not on file  . Highest education level: Not on file  Occupational History  . Occupation: Retired Therapist, art  Tobacco Use  . Smoking status: Never Smoker  . Smokeless tobacco: Never Used  Substance and Sexual Activity  . Alcohol use: No  . Drug use: No  . Sexual activity: Not on file  Other Topics Concern  . Not on file  Social History Narrative  . Not on file   Social Determinants of Health   Financial Resource Strain:   . Difficulty of Paying Living Expenses: Not on file  Food Insecurity:   . Worried About Charity fundraiser in the Last Year: Not on file  . Ran Out of Food in the Last Year: Not on file  Transportation Needs:   . Lack of Transportation (Medical): Not on file  . Lack of Transportation (Non-Medical): Not on file  Physical Activity:    . Days of Exercise per Week: Not on file  . Minutes of Exercise per Session: Not on file  Stress:   . Feeling of Stress : Not on file  Social Connections:   . Frequency of Communication with Friends and Family: Not on file  . Frequency of Social Gatherings with Friends and Family: Not on file  . Attends Religious Services: Not on file  . Active Member of Clubs or Organizations: Not on file  . Attends Archivist Meetings: Not on file  . Marital Status: Not on file   Family History  Problem Relation Age of Onset  . Pneumonia Mother 72  . CAD Father 46  . CAD Sister 46  . CAD Brother 74  . Other Maternal Grandmother 96  . Other Brother 49       drug overdose  . CAD Other 75  . Colon cancer Neg Hx    Allergies  Allergen Reactions  . Codeine Nausea And Vomiting  . Demerol Nausea And Vomiting  . Hydrochlorothiazide W-Triamterene Other (See Comments)    unspecified  . Quinine Other (See Comments)    unspecified   Prior to Admission medications   Medication Sig Start Date End Date Taking? Authorizing Provider  calcium citrate (CALCITRATE - DOSED IN MG ELEMENTAL CALCIUM)  950 MG tablet Take 1 tablet by mouth 2 (two) times daily.    Yes [provider]  Cholecalciferol (VITAMIN D) 50 MCG (2000 UT) CAPS Take 2,000 Units by mouth 3 (three) times daily.   Yes [provider]  enalapril (VASOTEC) 5 MG tablet Take 1 tablet (5 mg total) by mouth at bedtime. 07/01/19  Yes Mikey Kirschner, MD  famotidine (PEPCID) 40 MG tablet Take 1 tablet (40 mg total) by mouth daily. Patient taking differently: Take 40 mg by mouth at bedtime.  07/01/19  Yes Mikey Kirschner, MD  ferrous sulfate 325 (65 FE) MG tablet Take 325 mg by mouth daily with breakfast.   Yes [provider]  folic acid (FOLVITE) Q000111Q MCG tablet Take 800 mcg by mouth daily.    Yes [provider]  HYDROcodone-acetaminophen (NORCO/VICODIN) 5-325 MG tablet One po Q 4-6 hours prn Patient  taking differently: Take 1 tablet by mouth every 6 (six) hours as needed for severe pain. One po Q 4-6 hours prn 09/19/19  Yes Mikey Kirschner, MD  levothyroxine (SYNTHROID) 150 MCG tablet Take 1 tablet (150 mcg total) by mouth daily before breakfast. 12/29/16  Yes Rosita Fire, MD  MAGNESIUM-ZINC PO Take 2 tablets by mouth daily.   Yes [provider]  Melatonin 10 MG TABS Take 20 mg by mouth at bedtime.   Yes [provider]  Multiple Vitamins-Minerals (MULTIVITAMINS THER. W/MINERALS) TABS Take 1 tablet by mouth 2 (two) times daily.     Yes [provider]  valACYclovir (VALTREX) 1000 MG tablet TAKE 2 TABLETS AT FIRST SIGN OF FEVER BLISTER, THEN 2 TABLETS 2 HOURSLATER Patient taking differently: Take 1,000 mg by mouth 2 (two) times daily as needed (fever blister).  12/29/17  Yes Mikey Kirschner, MD  vitamin B-12 (CYANOCOBALAMIN) 500 MCG tablet Take 500 mcg by mouth daily.    Yes [provider]  HYDROcodone-homatropine (HYCODAN) 5-1.5 MG/5ML syrup Take one tspn by mouth every 6 hours prn cough Patient not taking: Reported on 12/12/2019 03/04/19   Mikey Kirschner, MD     Positive ROS: All other systems have been reviewed and were otherwise negative with the exception of those mentioned in the HPI and as above.  Physical Exam: General: Alert, no acute distress Cardiovascular: No pedal edema Respiratory: No cyanosis, no use of accessory musculature GI: abdomen soft Skin: No lesions in the area of chief complaint Neurologic: Sensation intact distally Psychiatric: Patient is competent for consent with normal mood and affect Lymphatic: no lymphedema  MUSCULOSKELETAL: exam stable  Assessment: right hip degenerative joint disease  Plan: Plan for Procedure(s): RIGHT TOTAL HIP ARTHROPLASTY ANTERIOR APPROACH  The risks benefits and alternatives were discussed with the patient including but not limited to the risks of nonoperative treatment,  versus surgical intervention including infection, bleeding, nerve injury,  blood clots, cardiopulmonary complications, morbidity, mortality, among others, and they were willing to proceed.   Preoperative templating of the joint replacement has been completed, documented, and submitted to the Operating Room personnel in order to optimize intra-operative equipment management.  Eduard Roux, MD   12/23/2019 9:53 AM

## 2019-12-23 NOTE — Evaluation (Signed)
Physical Therapy Evaluation Patient Details Name: [name] MRN: [id number] DOB: [birth year] Today's Date: 12/23/2019   History of Present Illness  [name] is a 74 y.o. female who presents for surgical treatment of right hip degenerative joint disease now s/p R THA.  PMH positive for Thyroid CA, arthritis, GERD, kidnes stones, HTN and hypothyroid.  Clinical Impression  Patient presents with mobility limited due to pain, nausea and decreased activity tolerance.  She was not needing a lot of help to come up to EOB so feel she will progress quickly once her nausea symptoms subside.  She will have family to assist upon d/c.  Feel she may need at least two sessions of PT prior to d/c.  PT to follow.     Follow Up Recommendations Follow surgeon's recommendation for DC plan and follow-up therapies    Equipment Recommendations  None recommended by PT    Recommendations for Other Services       Precautions / Restrictions Precautions Precautions: Fall Restrictions Weight Bearing Restrictions: Yes RLE Weight Bearing: Weight bearing as tolerated      Mobility  Bed Mobility Overal bed mobility: Needs Assistance Bed Mobility: Supine to Sit;Sit to Supine     Supine to sit: HOB elevated;Min assist Sit to supine: Min assist   General bed mobility comments: assist for R LE  Transfers                 General transfer comment: attempted x 3 each time increased nausea, with increased epigastric pain so returned to supine  Ambulation/Gait             General Gait Details: NT  Stairs            Wheelchair Mobility    Modified Rankin (Stroke Patients Only)       Balance Overall balance assessment: Mild deficits observed, not formally tested                                           Pertinent Vitals/Pain Pain Assessment: Faces Faces Pain Scale: Hurts whole lot Pain Location: R hip and epigastric pain Pain Descriptors /  Indicators: Aching;Discomfort;Grimacing Pain Intervention(s): Monitored during session;Repositioned;Limited activity within patient's tolerance;Premedicated before session    Home Living Family/patient expects to be discharged to:: Private residence Living Arrangements: Alone Available Help at Discharge: Family;Available 24 hours/day Type of Home: House Home Access: Stairs to enter     Home Layout: One level Home Equipment: Environmental consultant - 2 wheels      Prior Function Level of Independence: Independent               Hand Dominance        Extremity/Trunk Assessment   Upper Extremity Assessment Upper Extremity Assessment: Overall WFL for tasks assessed    Lower Extremity Assessment Lower Extremity Assessment: RLE deficits/detail RLE Deficits / Details: AROM limited due to pain, strength at least 2+/5       Communication   Communication: No difficulties  Cognition Arousal/Alertness: Awake/alert Behavior During Therapy: WFL for tasks assessed/performed Overall Cognitive Status: Within Functional Limits for tasks assessed                                        General Comments General comments (skin integrity, edema, etc.): RN  made aware of pt's nausea/chest pain, VSS during mobility and pt reports pain consistent with when she has GERD and has to take TUMS at home.  RN reports had protonix and pain meds prior to session    Exercises Total Joint Exercises Ankle Circles/Pumps: AROM;Both;5 reps;Supine Quad Sets: AROM;5 reps;Supine;Right Heel Slides: 5 reps;Supine;Right;AAROM Hip ABduction/ADduction: AAROM;5 reps;Supine;Right   Assessment/Plan    PT Assessment Patient needs continued PT services  PT Problem List Decreased range of motion;Decreased strength;Decreased activity tolerance;Decreased mobility;Pain;Decreased knowledge of use of DME       PT Treatment Interventions DME instruction;Stair training;Therapeutic activities;Patient/family  education;Therapeutic exercise;Functional mobility training;Gait training    PT Goals (Current goals can be found in the Care Plan section)  Acute Rehab PT Goals Patient Stated Goal: to return to independent PT Goal Formulation: With patient Time For Goal Achievement: 12/30/19 Potential to Achieve Goals: Good    Frequency 7X/week   Barriers to discharge        Co-evaluation               AM-PAC PT "6 Clicks" Mobility  Outcome Measure Help needed turning from your back to your side while in a flat bed without using bedrails?: A Little Help needed moving from lying on your back to sitting on the side of a flat bed without using bedrails?: A Little Help needed moving to and from a bed to a chair (including a wheelchair)?: Total Help needed standing up from a chair using your arms (e.g., wheelchair or bedside chair)?: Total Help needed to walk in hospital room?: Total Help needed climbing 3-5 steps with a railing? : Total 6 Click Score: 10    End of Session   Activity Tolerance: Patient limited by pain;Other (comment)(and nausea) Patient left: in bed;with call bell/phone within reach Nurse Communication: Mobility status PT Visit Diagnosis: Other abnormalities of gait and mobility (R26.89);Difficulty in walking, not elsewhere classified (R26.2);Pain Pain - Right/Left: Right Pain - part of body: Hip    Time: 1725-1755 PT Time Calculation (min) (ACUTE ONLY): 30 min   Charges:   PT Evaluation $PT Eval Low Complexity: 1 Low PT Treatments $Therapeutic Activity: 8-22 mins        Magda Kiel, [place] Acute Rehabilitation Services [phone] 12/23/2019   Reginia Naas 12/23/2019, 6:17 PM

## 2019-12-23 NOTE — Transfer of Care (Signed)
Immediate Anesthesia Transfer of Care Note  Patient: [name]  Procedure(s) Performed: RIGHT TOTAL HIP ARTHROPLASTY ANTERIOR APPROACH (Right Hip)  Patient Location: PACU  Anesthesia Type:MAC and Spinal  Level of Consciousness: awake, alert  and oriented  Airway & Oxygen Therapy: Patient Spontanous Breathing and Patient connected to nasal cannula oxygen  Post-op Assessment: Report given to RN, Post -op Vital signs reviewed and stable and Patient moving all extremities X 4  Post vital signs: Reviewed and stable  Last Vitals:  Vitals Value Taken Time  BP 129/80 12/23/19 1408  Temp    Pulse 65 12/23/19 1409  Resp 24 12/23/19 1409  SpO2 98 % 12/23/19 1409  Vitals shown include unvalidated device data.  Last Pain:  Vitals:   12/23/19 1030  PainSc: 0-No pain         Complications: No apparent anesthesia complications

## 2019-12-24 DIAGNOSIS — M1611 Unilateral primary osteoarthritis, right hip: Secondary | ICD-10-CM | POA: Diagnosis not present

## 2019-12-24 DIAGNOSIS — R531 Weakness: Secondary | ICD-10-CM | POA: Diagnosis not present

## 2019-12-24 LAB — BASIC METABOLIC PANEL
Anion gap: 6 (ref 5–15)
BUN: 10 mg/dL (ref 8–23)
CO2: 25 mmol/L (ref 22–32)
Calcium: 8.6 mg/dL — ABNORMAL LOW (ref 8.9–10.3)
Chloride: 110 mmol/L (ref 98–111)
Creatinine, Ser: 0.82 mg/dL (ref 0.44–1.00)
GFR calc Af Amer: >60 mL/min (ref >60)
GFR calc non Af Amer: >60 mL/min (ref >60)
Glucose, Bld: 100 mg/dL — ABNORMAL HIGH (ref 70–99)
Potassium: 4.2 mmol/L (ref 3.5–5.1)
Sodium: 141 mmol/L (ref 135–145)

## 2019-12-24 LAB — CBC
HCT: 30.5 % — ABNORMAL LOW (ref 36.0–46.0)
Hemoglobin: 9.9 g/dL — ABNORMAL LOW (ref 12.0–15.0)
MCH: 29.8 pg (ref 26.0–34.0)
MCHC: 32.5 g/dL (ref 30.0–36.0)
MCV: 91.9 fL (ref 80.0–100.0)
Platelets: 154 10*3/uL (ref 150–400)
RBC: 3.32 MIL/uL — ABNORMAL LOW (ref 3.87–5.11)
RDW: 13.6 % (ref 11.5–15.5)
WBC: 5.8 10*3/uL (ref 4.0–10.5)
nRBC: 0 % (ref 0.0–0.2)

## 2019-12-24 NOTE — TOC Initial Note (Signed)
Transition of Care [facility]) - Initial/Assessment Note    Patient Details  Name: LAQUISTA SHORB MRN: [id number] Date of Birth: [birth year]  Transition of Care [facility] CM/SW Contact:    Sharin Mons, RN Phone Number: 12/24/2019, 8:57 AM  Clinical Narrative:                  - s/p RIGHT TOTAL HIP ARTHROPLASTY, 12/23/2019. Pt from home alone. States granddaughter to assist with care once d/c until sister from [place] arrives.  Pt will transition to home today. DME needs noted and will be provide to pt prior to d/c. Referral made with [facility] for home health PT needs ... acceptance pending.  Pt has transportation to home. Pt without concerns affording Rx meds.   Barriers to Discharge: No Barriers Identified   Patient Goals and CMS Choice     Choice offered to / list presented to : Patient  Expected Discharge Plan and Services                           DME Arranged: 3-N-1, Walker rolling DME Agency: [employer] Date DME Agency Contacted: 12/24/19 Time DME Agency Contacted: [phone] Representative spoke with at DME Agency: Bedside nurse to provide [facility] Arranged: PT [place]: [place] ([place]) Date [place]: 12/24/19 Time [place]: [phone] Representative spoke with at [place]: [place] Arrangements/Services                       Activities of Daily Living Home Assistive Devices/Equipment: Radio producer (specify quad or straight), Eyeglasses ADL Screening (condition at time of admission) Patient's cognitive ability adequate to safely complete daily activities?: Yes Is the patient deaf or have difficulty hearing?: No Does the patient have difficulty seeing, even when wearing glasses/contacts?: No Does the patient have difficulty concentrating, remembering, or making decisions?: No Patient able to express need for assistance with ADLs?: Yes Does the patient have difficulty dressing or bathing?: Yes Independently performs ADLs?:  No Communication: Independent Dressing (OT): Needs assistance Is this a change from baseline?: Change from baseline, expected to last <3days Grooming: Independent Feeding: Independent Bathing: Needs assistance Is this a change from baseline?: Change from baseline, expected to last <3 days Toileting: Needs assistance Is this a change from baseline?: Change from baseline, expected to last <3 days In/Out Bed: Needs assistance Is this a change from baseline?: Change from baseline, expected to last <3 days Does the patient have difficulty walking or climbing stairs?: Yes Weakness of Legs: Right Weakness of Arms/Hands: None  Permission Sought/Granted                  Emotional Assessment              Admission diagnosis:  Status post total replacement of right hip [Z96.641] Patient Active Problem List   Diagnosis Date Noted  . Status post total replacement of right hip 12/23/2019  . Primary osteoarthritis of right hip 12/07/2019  . Chest pain 12/27/2016  . Hypothyroidism 05/22/2016  . Essential hypertension, benign 09/09/2013  . Hyperlipidemia LDL goal <130 09/09/2013  . Esophageal reflux 09/09/2013   PCP:  Mikey Kirschner, MD Pharmacy:   [id number] - [place], [address] [place] [address] Phone: [phone] Fax: [phone]     Social Determinants of Health (SDOH) Interventions    Readmission Risk Interventions No flowsheet data  found.  

## 2019-12-24 NOTE — Progress Notes (Signed)
Subjective: 1 Day Post-Op Procedure(s) (LRB): RIGHT TOTAL HIP ARTHROPLASTY ANTERIOR APPROACH (Right) Patient reports pain as mild.  Patient with mild nausea yesterday and this am.  No vomiting.   Objective: Vital signs in last 24 hours: Temp:  [97.5 F (36.4 C)-98.7 F (37.1 C)] 98 F (36.7 C) (01/12 0750) Pulse Rate:  [58-73] 59 (01/12 0750) Resp:  [12-19] 18 (01/12 0750) BP: (112-170)/(56-114) 125/73 (01/12 0750) SpO2:  [96 %-100 %] 99 % (01/12 0750) Weight:  [77.6 kg] 77.6 kg (01/11 1020)  Intake/Output from previous day: 01/11 0701 - 01/12 0700 In: 1498.1 [P.O.:240; I.V.:958.1; IV Piggyback:250] Out: 2050 [Urine:1650; Blood:400] Intake/Output this shift: Total I/O In: 240 [P.O.:240] Out: -   Recent Labs    12/24/19 0655  HGB 9.9*   Recent Labs    12/24/19 0655  WBC 5.8  RBC 3.32*  HCT 30.5*  PLT 154   Recent Labs    12/24/19 0655  NA 141  K 4.2  CL 110  CO2 25  BUN 10  CREATININE 0.82  GLUCOSE 100*  CALCIUM 8.6*   No results for input(s): LABPT, INR in the last 72 hours.  Neurologically intact Neurovascular intact Sensation intact distally Intact pulses distally Dorsiflexion/Plantar flexion intact Incision: dressing C/D/I No cellulitis present Compartment soft   Assessment/Plan: 1 Day Post-Op Procedure(s) (LRB): RIGHT TOTAL HIP ARTHROPLASTY ANTERIOR APPROACH (Right) Advance diet Up with therapy Discharge home with home health today vs tomorrow depending on progression with PT.  Please call office for d/c order WBAT RLE ABLA- mild and stable Dry dressing change prn      Aundra Dubin 12/24/2019, 8:07 AM

## 2019-12-24 NOTE — Progress Notes (Signed)
Physical Therapy Treatment Patient Details Name: [name] MRN: [id number] DOB: 06/13/1946 Today's Date: 12/24/2019    History of Present Illness [name] is a 74 y.o. female who presents s/p direct anterior total hip arthroplasty (right) on 12/23/2019. PMH positive for Thyroid CA, arthritis, GERD, kidnes stones, HTN and hypothyroid.    PT Comments    Pt progressing towards physical therapy goals. Was able to improve ambulation distance this session however continues to be limited by nausea. HEP handout issued and we reviewed exercise as well. Pt with improved knee AROM by end of ther ex. Will see for second session prior to d/c which is planned for this afternoon, with the focus on continued gait training and stair training.    Follow Up Recommendations  Follow surgeon's recommendation for DC plan and follow-up therapies     Equipment Recommendations  Rolling walker with 5" wheels    Recommendations for Other Services       Precautions / Restrictions Precautions Precautions: Fall Restrictions Weight Bearing Restrictions: Yes RLE Weight Bearing: Weight bearing as tolerated    Mobility  Bed Mobility               General bed mobility comments: Pt was received sitting up in recliner chair  Transfers Overall transfer level: Needs assistance Equipment used: Rolling walker (2 wheeled) Transfers: Sit to/from Stand Sit to Stand: Min guard         General transfer comment: Close guard for safety as pt powered up to full standing position. Increased time required, and min cues for hand placement on seated surface for safety.   Ambulation/Gait Ambulation/Gait assistance: Min guard Gait Distance (Feet): 150 Feet Assistive device: Rolling walker (2 wheeled) Gait Pattern/deviations: Step-through pattern;Decreased stride length;Trunk flexed;Decreased weight shift to right;Decreased dorsiflexion - right;Decreased stance time - right;Decreased step length -  right Gait velocity: Decreased Gait velocity interpretation: <1.31 ft/sec, indicative of household ambulator General Gait Details: VC's for improved posture, forward gaze, and sequencing with RW. Pt was able to progress to step-through gait pattern by end of gait training but continues to be choppy and effortful.    Stairs             Wheelchair Mobility    Modified Rankin (Stroke Patients Only)       Balance Overall balance assessment: Mild deficits observed, not formally tested                                          Cognition Arousal/Alertness: Awake/alert Behavior During Therapy: WFL for tasks assessed/performed Overall Cognitive Status: Within Functional Limits for tasks assessed                                        Exercises Total Joint Exercises Ankle Circles/Pumps: Both;10 reps Quad Sets: 10 reps;Both Short Arc Quad: Right;15 reps Hip ABduction/ADduction: Right;5 reps Long Arc Quad: Right;15 reps    General Comments        Pertinent Vitals/Pain Pain Assessment: Faces Faces Pain Scale: Hurts little more Pain Location: R hip Pain Descriptors / Indicators: Aching;Grimacing;Operative site guarding Pain Intervention(s): Limited activity within patient's tolerance;Monitored during session;Repositioned    Home Living  Prior Function            PT Goals (current goals can now be found in the care plan section) Acute Rehab PT Goals Patient Stated Goal: to return to independent PT Goal Formulation: With patient Time For Goal Achievement: 12/30/19 Potential to Achieve Goals: Good Progress towards PT goals: Progressing toward goals    Frequency    7X/week      PT Plan Current plan remains appropriate    Co-evaluation              AM-PAC PT "6 Clicks" Mobility   Outcome Measure  Help needed turning from your back to your side while in a flat bed without using bedrails?: A  Little Help needed moving from lying on your back to sitting on the side of a flat bed without using bedrails?: A Little Help needed moving to and from a bed to a chair (including a wheelchair)?: A Little Help needed standing up from a chair using your arms (e.g., wheelchair or bedside chair)?: A Little Help needed to walk in hospital room?: A Little Help needed climbing 3-5 steps with a railing? : A Little 6 Click Score: 18    End of Session Equipment Utilized During Treatment: Gait belt Activity Tolerance: Patient limited by pain(and nausea) Patient left: in chair;with call bell/phone within reach Nurse Communication: Mobility status PT Visit Diagnosis: Other abnormalities of gait and mobility (R26.89);Difficulty in walking, not elsewhere classified (R26.2);Pain Pain - Right/Left: Right Pain - part of body: Hip     Time: [id number] PT Time Calculation (min) (ACUTE ONLY): 35 min  Charges:  $Gait Training: 8-22 mins $Therapeutic Exercise: 8-22 mins                     Rolinda Roan, PT, DPT Acute Rehabilitation Services Pager: [phone] Office: [phone]    [name] 12/24/2019, 9:39 AM

## 2019-12-24 NOTE — Progress Notes (Signed)
Physical Therapy Progress Note   Assessment: Pt progressing well with post-op mobility. Focus of session was ADL training for LB dressing with adaptive equipment, gait training, and stair training. Pt currently functioning at a min guard assist to supervision level with RW for support. Pt anticipates d/c home after lunch and was educated on safe car transfer as well. Will continue to follow until d/c.    12/24/19 1239  PT Visit Information  Last PT Received On 12/24/19  Assistance Needed +1  History of Present Illness [name] is a 74 y.o. female who presents s/p direct anterior total hip arthroplasty (right) on 12/23/2019. PMH positive for Thyroid CA, arthritis, GERD, kidnes stones, HTN and hypothyroid.  Subjective Data  Patient Stated Goal to return to independent  Precautions  Precautions Fall  Restrictions  Weight Bearing Restrictions Yes  RLE Weight Bearing WBAT  Pain Assessment  Pain Assessment Faces  Faces Pain Scale 4  Pain Location R hip  Pain Descriptors / Indicators Aching;Grimacing;Operative site guarding  Pain Intervention(s) Limited activity within patient's tolerance;Monitored during session;Repositioned  Cognition  Arousal/Alertness Awake/alert  Behavior During Therapy WFL for tasks assessed/performed  Overall Cognitive Status Within Functional Limits for tasks assessed  Bed Mobility  Overal bed mobility Needs Assistance  Bed Mobility Sit to Supine  Sit to supine Min assist  General bed mobility comments Assist to elevate LE's up onto bed at end of session.   Transfers  Overall transfer level Needs assistance  Equipment used Rolling walker (2 wheeled)  Transfers Sit to/from Stand  Sit to Stand Supervision  General transfer comment Pt able to manage walker well and powered up to full stand without assistance and with good hand placement on seated surface for safety.   Ambulation/Gait  Ambulation/Gait assistance Min guard;Supervision  Gait Distance (Feet)  350 Feet  Assistive device Rolling walker (2 wheeled)  Gait Pattern/deviations Step-through pattern;Decreased stride length;Trunk flexed;Decreased weight shift to right;Decreased dorsiflexion - right;Decreased stance time - right;Decreased step length - right  General Gait Details VC's for improved posture, forward gaze, and sequencing with RW. Pt was able to progress to smooth step-through gait pattern but required occasional cues to maintain.   Gait velocity Decreased  Gait velocity interpretation 1.31 - 2.62 ft/sec, indicative of limited community ambulator  Stairs Yes  Stairs assistance Min guard  Stair Management No rails;Step to pattern;Backwards;With walker  Number of Stairs 3  General stair comments VC's for sequencing and safety with backwards negotiation with RW.   Balance  Overall balance assessment Mild deficits observed, not formally tested  General Comments  General comments (skin integrity, edema, etc.) Pt was instructed in LB dressing with sock aide and use of reacher.   PT - End of Session  Equipment Utilized During Treatment Gait belt  Activity Tolerance Patient tolerated treatment well  Patient left in bed;with call bell/phone within reach  Nurse Communication Mobility status   PT - Assessment/Plan  PT Plan Current plan remains appropriate  PT Visit Diagnosis Other abnormalities of gait and mobility (R26.89);Difficulty in walking, not elsewhere classified (R26.2);Pain  Pain - Right/Left Right  Pain - part of body Hip  PT Frequency (ACUTE ONLY) 7X/week  Follow Up Recommendations Follow surgeon's recommendation for DC plan and follow-up therapies  PT equipment Rolling walker with 5" wheels  AM-PAC PT "6 Clicks" Mobility Outcome Measure (Version 2)  Help needed turning from your back to your side while in a flat bed without using bedrails? 3  Help needed moving from lying  on your back to sitting on the side of a flat bed without using bedrails? 3  Help needed moving to  and from a bed to a chair (including a wheelchair)? 3  Help needed standing up from a chair using your arms (e.g., wheelchair or bedside chair)? 3  Help needed to walk in hospital room? 3  Help needed climbing 3-5 steps with a railing?  3  6 Click Score 18  Consider Recommendation of Discharge To: Home with [facility]  PT Goal Progression  Progress towards PT goals Progressing toward goals  Acute Rehab PT Goals  PT Goal Formulation With patient  Time For Goal Achievement 12/30/19  Potential to Achieve Goals Good  PT Time Calculation  PT Start Time (ACUTE ONLY) 1141  PT Stop Time (ACUTE ONLY) 1223  PT Time Calculation (min) (ACUTE ONLY) 42 min  PT General Charges  $$ ACUTE PT VISIT 1 Visit  PT Treatments  $Gait Training 23-37 mins  $Self Care/Home Management 8-22   Rolinda Roan, PT, DPT Acute Rehabilitation Services Pager: [phone] Office: [phone]

## 2019-12-24 NOTE — Discharge Summary (Signed)
Patient ID: [name] MRN: [id number] DOB/AGE: 1946/10/06 74 y.o.  Admit date: 12/23/2019 Discharge date: 12/24/2019  Admission Diagnoses:  Principal Problem:   Primary osteoarthritis of right hip Active Problems:   Status post total replacement of right hip   Discharge Diagnoses:  Same  Past Medical History:  Diagnosis Date  . Arthritis   . Cancer [facility])    Thyroid cancer  . GERD (gastroesophageal reflux disease)   . History of kidney stones   . Hypercholesteremia    resolved after gastric bypass  . Hypertension   . Hypothyroidism     Surgeries: Procedure(s): RIGHT TOTAL HIP ARTHROPLASTY ANTERIOR APPROACH on 12/23/2019   Consultants:   Discharged Condition: Improved  Hospital Course: [name] is an 73 y.o. female who was admitted 12/23/2019 for operative treatment ofPrimary osteoarthritis of right hip. Patient has severe unremitting pain that affects sleep, daily activities, and work/hobbies. After pre-op clearance the patient was taken to the operating room on 12/23/2019 and underwent  Procedure(s): RIGHT TOTAL HIP ARTHROPLASTY ANTERIOR APPROACH.    Patient was given perioperative antibiotics:  Anti-infectives (From admission, onward)   Start     Dose/Rate Route Frequency Ordered Stop   12/23/19 1800  ceFAZolin (ANCEF) IVPB 2g/100 mL premix     2 g 200 mL/hr over 30 Minutes Intravenous Every 6 hours 12/23/19 1514 12/24/19 0540   12/23/19 1512  valACYclovir (VALTREX) tablet 1,000 mg    Note to Pharmacy: TAKE 2 TABLETS AT FIRST SIGN OF FEVER BLISTER, THEN 2 TABLETS 2 HOURSLATER     1,000 mg Oral 2 times daily PRN 12/23/19 1512     12/23/19 1251  vancomycin (VANCOCIN) powder  Status:  Discontinued       As needed 12/23/19 1251 12/23/19 1412   12/23/19 1016  ceFAZolin (ANCEF) 2-4 GM/100ML-% IVPB    Note to Pharmacy: [facility]   : cabinet override      12/23/19 1016 12/23/19 2229   12/23/19 0930  ceFAZolin (ANCEF) IVPB 2g/100 mL premix  Status:   Discontinued     2 g 200 mL/hr over 30 Minutes Intravenous On call to O.R. 12/23/19 0927 12/23/19 1012       Patient was given sequential compression devices, early ambulation, and chemoprophylaxis to prevent DVT.  Patient benefited maximally from hospital stay and there were no complications.    Recent vital signs:  Patient Vitals for the past 24 hrs:  BP Temp Temp src Pulse Resp SpO2  12/24/19 1219 (!) 117/98 98.9 F (37.2 C) Oral 66 18 100 %  12/24/19 0750 125/73 98 F (36.7 C) Oral (!) 59 18 99 %  12/24/19 0504 113/67 (!) 97.5 F (36.4 C) Oral 62 18 98 %  12/23/19 2324 (!) 112/56 97.7 F (36.5 C) Oral (!) 58 18 98 %  12/23/19 1923 118/65 97.6 F (36.4 C) Oral 63 18 97 %  12/23/19 1534 (!) 141/81 98.3 F (36.8 C) Oral 73 19 96 %  12/23/19 1523 (!) 158/85 97.7 F (36.5 C) Oral 60 18 100 %  12/23/19 1500 (!) 148/73 -- -- 64 12 100 %  12/23/19 1453 (!) 145/75 98.1 F (36.7 C) -- 64 14 99 %  12/23/19 1438 134/75 -- -- 62 12 98 %  12/23/19 1423 (!) 145/114 -- -- 65 12 98 %  12/23/19 1408 129/80 98.1 F (36.7 C) -- 64 17 99 %     Recent laboratory studies:  Recent Labs    12/24/19 0655  WBC 5.8  HGB 9.9*  HCT 30.5*  PLT 154  NA 141  K 4.2  CL 110  CO2 25  BUN 10  CREATININE 0.82  GLUCOSE 100*  CALCIUM 8.6*     Discharge Medications:   Allergies as of 12/24/2019      Reactions   Codeine Nausea And Vomiting   Demerol Nausea And Vomiting   5-alpha Reductase Inhibitors    Hydrochlorothiazide W-triamterene Other (See Comments)   unspecified   Quinine Other (See Comments)   unspecified      Medication List    STOP taking these medications   HYDROcodone-acetaminophen 5-325 MG tablet Commonly known as: NORCO/VICODIN     TAKE these medications   aspirin EC 81 MG tablet Take 1 tablet (81 mg total) by mouth 2 (two) times daily.   calcium citrate 950 (200 Ca) MG tablet Commonly known as: CALCITRATE - dosed in mg elemental calcium Take 1 tablet by mouth  2 (two) times daily.   enalapril 5 MG tablet Commonly known as: VASOTEC Take 1 tablet (5 mg total) by mouth at bedtime.   famotidine 40 MG tablet Commonly known as: Pepcid Take 1 tablet (40 mg total) by mouth daily. What changed: when to take this   ferrous sulfate 325 (65 FE) MG tablet Take 325 mg by mouth daily with breakfast.   folic acid Q000111Q MCG tablet Commonly known as: FOLVITE Take 800 mcg by mouth daily.   levothyroxine 150 MCG tablet Commonly known as: Synthroid Take 1 tablet (150 mcg total) by mouth daily before breakfast.   MAGNESIUM-ZINC PO Take 2 tablets by mouth daily.   Melatonin 10 MG Tabs Take 20 mg by mouth at bedtime.   methocarbamol 750 MG tablet Commonly known as: ROBAXIN Take 1 tablet (750 mg total) by mouth 2 (two) times daily as needed for muscle spasms.   multivitamins ther. w/minerals Tabs tablet Take 1 tablet by mouth 2 (two) times daily.   ondansetron 4 MG tablet Commonly known as: ZOFRAN Take 1-2 tablets (4-8 mg total) by mouth every 8 (eight) hours as needed for nausea or vomiting.   oxyCODONE 5 MG immediate release tablet Commonly known as: Oxy IR/ROXICODONE Take 1-2 tablets (5-10 mg total) by mouth every 8 (eight) hours as needed for severe pain.   oxyCODONE 10 mg 12 hr tablet Commonly known as: OXYCONTIN Take 1 tablet (10 mg total) by mouth every 12 (twelve) hours for 3 days.   valACYclovir 1000 MG tablet Commonly known as: VALTREX TAKE 2 TABLETS AT FIRST SIGN OF FEVER BLISTER, THEN 2 TABLETS 2 HOURSLATER What changed:   how much to take  how to take this  when to take this  reasons to take this  additional instructions   vitamin B-12 500 MCG tablet Commonly known as: CYANOCOBALAMIN Take 500 mcg by mouth daily.   Vitamin D 50 MCG (2000 UT) Caps Take 2,000 Units by mouth 3 (three) times daily.            Durable Medical Equipment  (From admission, onward)         Start     Ordered   12/24/19 1142  For home  use only DME Walker rolling  Once    Question Answer Comment  Walker: With 5 Inch Wheels   Patient needs a walker to treat with the following condition Status post hip surgery      12/24/19 1142   12/23/19 1514  DME Walker rolling  Once    Question:  Patient  needs a walker to treat with the following condition  Answer:  History of hip replacement   12/23/19 1514   12/23/19 1514  DME 3 n 1  Once     12/23/19 1514   12/23/19 1514  DME Bedside commode  Once    Question:  Patient needs a bedside commode to treat with the following condition  Answer:  History of hip replacement   12/23/19 1514          Diagnostic Studies: DG Chest 2 View  Result Date: 12/19/2019 CLINICAL DATA:  Preop hip replacement EXAM: CHEST - 2 VIEW COMPARISON:  12/27/2016 FINDINGS: The heart size and mediastinal contours are within normal limits. Both lungs are clear. The visualized skeletal structures are unremarkable. IMPRESSION: No active cardiopulmonary disease. Electronically Signed   By: Kathreen Devoid   On: 12/19/2019 16:08   DG Pelvis Portable  Result Date: 12/23/2019 CLINICAL DATA:  Right hip replacement. EXAM: PORTABLE PELVIS 1-2 VIEWS COMPARISON:  Right hip x-rays dated August 30, 2019. FINDINGS: The right hip demonstrates a total arthroplasty without evidence of hardware failure or complication. There is expected intra-articular air. There is no fracture or dislocation. The alignment is anatomic. Post-surgical changes noted in the surrounding soft tissues. Unchanged moderate left hip osteoarthritis. IMPRESSION: Interval right total hip arthroplasty without acute postoperative complication. Electronically Signed   By: Titus Dubin M.D.   On: 12/23/2019 14:41   DG C-Arm 1-60 Min  Result Date: 12/23/2019 CLINICAL DATA:  Right total hip arthroplasty EXAM: OPERATIVE RIGHT HIP (WITH PELVIS IF PERFORMED) AP VIEWS TECHNIQUE: Fluoroscopic spot image(s) were submitted for interpretation post-operatively. COMPARISON:   08/30/2019 FINDINGS: 6 C-arm fluoroscopic images were obtained intraoperatively and submitted for post operative interpretation. Subsequent images demonstrate placement of right total hip arthroplasty hardware. Arthroplasty components appear in their expected alignment without obvious intraoperative complication. 28 seconds of fluoroscopy time was utilized. Please see the performing provider's procedural report for further detail. IMPRESSION: As above. Electronically Signed   By: Davina Poke D.O.   On: 12/23/2019 14:07   DG HIP OPERATIVE UNILAT W OR W/O PELVIS RIGHT  Result Date: 12/23/2019 CLINICAL DATA:  Right total hip arthroplasty EXAM: OPERATIVE RIGHT HIP (WITH PELVIS IF PERFORMED) AP VIEWS TECHNIQUE: Fluoroscopic spot image(s) were submitted for interpretation post-operatively. COMPARISON:  08/30/2019 FINDINGS: 6 C-arm fluoroscopic images were obtained intraoperatively and submitted for post operative interpretation. Subsequent images demonstrate placement of right total hip arthroplasty hardware. Arthroplasty components appear in their expected alignment without obvious intraoperative complication. 28 seconds of fluoroscopy time was utilized. Please see the performing provider's procedural report for further detail. IMPRESSION: As above. Electronically Signed   By: Davina Poke D.O.   On: 12/23/2019 14:07    Disposition: Discharge disposition: 01-Home or Self Care       Discharge Instructions    Call MD / Call 911   Complete by: As directed    If you experience chest pain or shortness of breath, CALL 911 and be transported to the hospital emergency room.  If you develope a fever above 101.5 F, pus (white drainage) or increased drainage or redness at the wound, or calf pain, call your surgeon's office.   Constipation Prevention   Complete by: As directed    Drink plenty of fluids.  Prune juice may be helpful.  You may use a stool softener, such as Colace (over the counter) 100 mg  twice a day.  Use MiraLax (over the counter) for constipation as needed.   Driving  restrictions   Complete by: As directed    No driving while taking narcotic pain meds.   Increase activity slowly as tolerated   Complete by: As directed       Follow-up Information    Leandrew Koyanagi, MD In 2 weeks.   Specialty: Orthopedic Surgery Why: For suture removal, For wound re-check Contact information: [place] [address] [phone]        [facility] Follow up.   [id number]           Signed: Aundra Dubin 12/24/2019, 12:49 PM

## 2019-12-24 NOTE — Plan of Care (Signed)
Pt doing well. Pt given D/C instructions with verbal understanding. Rx's were sent to pharmacy by MD. Pt's incision is clean and dry with no sign of infection. Pt's IV was removed prior to D/C. Pt received RW and 3-n-1 from [facility] per MD order. Home Health was arranged by CM. Pt D/C'd home via wheelchair per MD order. Pt is stable @ D/C and has no other needs at this time. Holli Humbles, RN

## 2019-12-25 ENCOUNTER — Telehealth: Payer: Self-pay

## 2019-12-25 DIAGNOSIS — K219 Gastro-esophageal reflux disease without esophagitis: Secondary | ICD-10-CM | POA: Diagnosis not present

## 2019-12-25 DIAGNOSIS — I10 Essential (primary) hypertension: Secondary | ICD-10-CM | POA: Diagnosis not present

## 2019-12-25 DIAGNOSIS — Z9884 Bariatric surgery status: Secondary | ICD-10-CM | POA: Diagnosis not present

## 2019-12-25 DIAGNOSIS — Z96641 Presence of right artificial hip joint: Secondary | ICD-10-CM | POA: Diagnosis not present

## 2019-12-25 DIAGNOSIS — E039 Hypothyroidism, unspecified: Secondary | ICD-10-CM | POA: Diagnosis not present

## 2019-12-25 DIAGNOSIS — C73 Malignant neoplasm of thyroid gland: Secondary | ICD-10-CM | POA: Diagnosis not present

## 2019-12-25 DIAGNOSIS — Z7982 Long term (current) use of aspirin: Secondary | ICD-10-CM | POA: Diagnosis not present

## 2019-12-25 DIAGNOSIS — Z471 Aftercare following joint replacement surgery: Secondary | ICD-10-CM | POA: Diagnosis not present

## 2019-12-25 DIAGNOSIS — M1990 Unspecified osteoarthritis, unspecified site: Secondary | ICD-10-CM | POA: Diagnosis not present

## 2019-12-25 NOTE — Telephone Encounter (Signed)
Called Stacey no answer LMOM with details approving orders below.

## 2019-12-25 NOTE — Telephone Encounter (Signed)
[name] with [facility] would like verbal orders  2 x week for 4 weeks, for PT.  Cb# [phone].  Please advise.  Thank you.

## 2019-12-27 ENCOUNTER — Encounter: Payer: Self-pay | Admitting: *Deleted

## 2019-12-27 DIAGNOSIS — Z9884 Bariatric surgery status: Secondary | ICD-10-CM | POA: Diagnosis not present

## 2019-12-27 DIAGNOSIS — Z7982 Long term (current) use of aspirin: Secondary | ICD-10-CM | POA: Diagnosis not present

## 2019-12-27 DIAGNOSIS — C73 Malignant neoplasm of thyroid gland: Secondary | ICD-10-CM | POA: Diagnosis not present

## 2019-12-27 DIAGNOSIS — Z96641 Presence of right artificial hip joint: Secondary | ICD-10-CM | POA: Diagnosis not present

## 2019-12-27 DIAGNOSIS — M1990 Unspecified osteoarthritis, unspecified site: Secondary | ICD-10-CM | POA: Diagnosis not present

## 2019-12-27 DIAGNOSIS — E039 Hypothyroidism, unspecified: Secondary | ICD-10-CM | POA: Diagnosis not present

## 2019-12-27 DIAGNOSIS — I10 Essential (primary) hypertension: Secondary | ICD-10-CM | POA: Diagnosis not present

## 2019-12-27 DIAGNOSIS — K219 Gastro-esophageal reflux disease without esophagitis: Secondary | ICD-10-CM | POA: Diagnosis not present

## 2019-12-27 DIAGNOSIS — Z471 Aftercare following joint replacement surgery: Secondary | ICD-10-CM | POA: Diagnosis not present

## 2019-12-31 DIAGNOSIS — Z96641 Presence of right artificial hip joint: Secondary | ICD-10-CM | POA: Diagnosis not present

## 2019-12-31 DIAGNOSIS — K219 Gastro-esophageal reflux disease without esophagitis: Secondary | ICD-10-CM | POA: Diagnosis not present

## 2019-12-31 DIAGNOSIS — I10 Essential (primary) hypertension: Secondary | ICD-10-CM | POA: Diagnosis not present

## 2019-12-31 DIAGNOSIS — Z471 Aftercare following joint replacement surgery: Secondary | ICD-10-CM | POA: Diagnosis not present

## 2019-12-31 DIAGNOSIS — Z7982 Long term (current) use of aspirin: Secondary | ICD-10-CM | POA: Diagnosis not present

## 2019-12-31 DIAGNOSIS — C73 Malignant neoplasm of thyroid gland: Secondary | ICD-10-CM | POA: Diagnosis not present

## 2019-12-31 DIAGNOSIS — E039 Hypothyroidism, unspecified: Secondary | ICD-10-CM | POA: Diagnosis not present

## 2019-12-31 DIAGNOSIS — Z9884 Bariatric surgery status: Secondary | ICD-10-CM | POA: Diagnosis not present

## 2019-12-31 DIAGNOSIS — M1990 Unspecified osteoarthritis, unspecified site: Secondary | ICD-10-CM | POA: Diagnosis not present

## 2020-01-02 DIAGNOSIS — M1990 Unspecified osteoarthritis, unspecified site: Secondary | ICD-10-CM | POA: Diagnosis not present

## 2020-01-02 DIAGNOSIS — K219 Gastro-esophageal reflux disease without esophagitis: Secondary | ICD-10-CM | POA: Diagnosis not present

## 2020-01-02 DIAGNOSIS — Z471 Aftercare following joint replacement surgery: Secondary | ICD-10-CM | POA: Diagnosis not present

## 2020-01-02 DIAGNOSIS — I10 Essential (primary) hypertension: Secondary | ICD-10-CM | POA: Diagnosis not present

## 2020-01-02 DIAGNOSIS — C73 Malignant neoplasm of thyroid gland: Secondary | ICD-10-CM | POA: Diagnosis not present

## 2020-01-02 DIAGNOSIS — E039 Hypothyroidism, unspecified: Secondary | ICD-10-CM | POA: Diagnosis not present

## 2020-01-02 DIAGNOSIS — Z96641 Presence of right artificial hip joint: Secondary | ICD-10-CM | POA: Diagnosis not present

## 2020-01-02 DIAGNOSIS — Z7982 Long term (current) use of aspirin: Secondary | ICD-10-CM | POA: Diagnosis not present

## 2020-01-02 DIAGNOSIS — Z9884 Bariatric surgery status: Secondary | ICD-10-CM | POA: Diagnosis not present

## 2020-01-07 ENCOUNTER — Ambulatory Visit: Payer: Medicare HMO | Admitting: Orthopaedic Surgery

## 2020-01-07 ENCOUNTER — Encounter: Payer: Self-pay | Admitting: Orthopaedic Surgery

## 2020-01-07 ENCOUNTER — Other Ambulatory Visit: Payer: Self-pay

## 2020-01-07 VITALS — Ht 67.0 in | Wt 171.0 lb

## 2020-01-07 DIAGNOSIS — M1990 Unspecified osteoarthritis, unspecified site: Secondary | ICD-10-CM | POA: Diagnosis not present

## 2020-01-07 DIAGNOSIS — Z96641 Presence of right artificial hip joint: Secondary | ICD-10-CM | POA: Diagnosis not present

## 2020-01-07 DIAGNOSIS — I10 Essential (primary) hypertension: Secondary | ICD-10-CM | POA: Diagnosis not present

## 2020-01-07 DIAGNOSIS — Z9884 Bariatric surgery status: Secondary | ICD-10-CM | POA: Diagnosis not present

## 2020-01-07 DIAGNOSIS — K219 Gastro-esophageal reflux disease without esophagitis: Secondary | ICD-10-CM | POA: Diagnosis not present

## 2020-01-07 DIAGNOSIS — Z7982 Long term (current) use of aspirin: Secondary | ICD-10-CM | POA: Diagnosis not present

## 2020-01-07 DIAGNOSIS — Z471 Aftercare following joint replacement surgery: Secondary | ICD-10-CM | POA: Diagnosis not present

## 2020-01-07 DIAGNOSIS — C73 Malignant neoplasm of thyroid gland: Secondary | ICD-10-CM | POA: Diagnosis not present

## 2020-01-07 DIAGNOSIS — E039 Hypothyroidism, unspecified: Secondary | ICD-10-CM | POA: Diagnosis not present

## 2020-01-07 MED ORDER — HYDROCODONE-ACETAMINOPHEN 5-325 MG PO TABS: 1.0000 | ORAL_TABLET | Freq: Two times a day (BID) | ORAL | 0 refills | Status: DC | PRN

## 2020-01-07 MED ORDER — CEPHALEXIN 500 MG PO CAPS: 500.0000 mg | ORAL_CAPSULE | Freq: Four times a day (QID) | ORAL | 0 refills | Status: DC

## 2020-01-07 NOTE — Progress Notes (Signed)
Post-Op Visit Note   Patient: [name]           Date of Birth: Nov 24, 1946           MRN: [id number] Visit Date: 01/07/2020 PCP: Mikey Kirschner, MD   Assessment & Plan:  Chief Complaint:  Chief Complaint  Patient presents with  . Right Hip - Routine Post Op    Right total hip arthroplasty DOS 12/23/19   Visit Diagnoses:  1. Status post total replacement of right hip     Plan: Patient is a pleasant 74 year old female who comes in today 2 weeks out right total hip replacement 12/23/2019.  She has been doing well.  She has been working with home health physical therapy making progress.  She is ambulating with a cane.  She is still having slight troubles with hip flexion.  No fevers or chills.  She is taking Tylenol and an occasional hydrocodone as needed.  Examination of her right hip reveals well-healing surgical incision with nylon sutures in place.  She does have a seroma.  Calves are soft and nontender.  She is neurovascularly intact distally.  Today, sutures removed and Steri-Strips applied.  175 serosanguinous fluid was drained from the seroma.  We will put her on prophylactic Keflex.  She will let us know if the seroma returns or if her symptoms worsen.  Otherwise, follow-up with [place] in 4 weeks for repeat evaluation [facility] pelvis lateral right hip x-rays.  Follow-Up Instructions: Return in about 4 weeks (around 02/04/2020).   Orders:  No orders of the defined types were placed in this encounter.  Meds ordered this encounter  Medications  . HYDROcodone-acetaminophen (NORCO) 5-325 MG tablet    Sig: Take 1-2 tablets by mouth 2 (two) times daily as needed for moderate pain.    Dispense:  28 tablet    Refill:  0  . cephALEXin (KEFLEX) 500 MG capsule    Sig: Take 1 capsule (500 mg total) by mouth 4 (four) times daily.    Dispense:  28 capsule    Refill:  0    Imaging: No new imaging  PMFS History: Patient Active Problem List   Diagnosis Date Noted  . Status post  total replacement of right hip 12/23/2019  . Primary osteoarthritis of right hip 12/07/2019  . Chest pain 12/27/2016  . Hypothyroidism 05/22/2016  . Essential hypertension, benign 09/09/2013  . Hyperlipidemia LDL goal <130 09/09/2013  . Esophageal reflux 09/09/2013   Past Medical History:  Diagnosis Date  . Arthritis   . Cancer [facility])    Thyroid cancer  . GERD (gastroesophageal reflux disease)   . History of kidney stones   . Hypercholesteremia    resolved after gastric bypass  . Hypertension   . Hypothyroidism     Family History  Problem Relation Age of Onset  . Pneumonia Mother 68  . CAD Father 61  . CAD Sister 78  . CAD Brother 24  . Other Maternal Grandmother 96  . Other Brother 33       drug overdose  . CAD Other 14  . Colon cancer Neg Hx     Past Surgical History:  Procedure Laterality Date  . ABDOMINAL HYSTERECTOMY    . BILATERAL SALPINGOOPHORECTOMY    . BREAST EXCISIONAL BIOPSY Bilateral 1995   benign  . CHOLECYSTECTOMY    . COLONOSCOPY  11/14/2011   Procedure: COLONOSCOPY;  Surgeon: Daneil Dolin, MD;  [facility];  Service: Endoscopy;  Laterality:  N/A;  . REPLACEMENT TOTAL KNEE BILATERAL    . ROUX-EN-Y PROCEDURE  2007  . TONSILLECTOMY    . TOTAL HIP ARTHROPLASTY Right 12/23/2019   Procedure: RIGHT TOTAL HIP ARTHROPLASTY ANTERIOR APPROACH;  Surgeon: Leandrew Koyanagi, MD;  Location: [place];  Service: Orthopedics;  Laterality: Right;  . TOTAL THYROIDECTOMY  12/2010  . TUBAL LIGATION     Social History   Occupational History  . Occupation: Retired Therapist, art  Tobacco Use  . Smoking status: Never Smoker  . Smokeless tobacco: Never Used  Substance and Sexual Activity  . Alcohol use: No  . Drug use: No  . Sexual activity: Not on file

## 2020-01-09 DIAGNOSIS — Z7982 Long term (current) use of aspirin: Secondary | ICD-10-CM | POA: Diagnosis not present

## 2020-01-09 DIAGNOSIS — I10 Essential (primary) hypertension: Secondary | ICD-10-CM | POA: Diagnosis not present

## 2020-01-09 DIAGNOSIS — K219 Gastro-esophageal reflux disease without esophagitis: Secondary | ICD-10-CM | POA: Diagnosis not present

## 2020-01-09 DIAGNOSIS — Z96641 Presence of right artificial hip joint: Secondary | ICD-10-CM | POA: Diagnosis not present

## 2020-01-09 DIAGNOSIS — C73 Malignant neoplasm of thyroid gland: Secondary | ICD-10-CM | POA: Diagnosis not present

## 2020-01-09 DIAGNOSIS — Z471 Aftercare following joint replacement surgery: Secondary | ICD-10-CM | POA: Diagnosis not present

## 2020-01-09 DIAGNOSIS — E039 Hypothyroidism, unspecified: Secondary | ICD-10-CM | POA: Diagnosis not present

## 2020-01-09 DIAGNOSIS — M1990 Unspecified osteoarthritis, unspecified site: Secondary | ICD-10-CM | POA: Diagnosis not present

## 2020-01-09 DIAGNOSIS — Z9884 Bariatric surgery status: Secondary | ICD-10-CM | POA: Diagnosis not present

## 2020-01-13 ENCOUNTER — Ambulatory Visit: Payer: Medicare HMO | Admitting: Physician Assistant

## 2020-01-13 ENCOUNTER — Encounter: Payer: Self-pay | Admitting: Physician Assistant

## 2020-01-13 ENCOUNTER — Other Ambulatory Visit: Payer: Self-pay

## 2020-01-13 DIAGNOSIS — M1990 Unspecified osteoarthritis, unspecified site: Secondary | ICD-10-CM | POA: Diagnosis not present

## 2020-01-13 DIAGNOSIS — Z96641 Presence of right artificial hip joint: Secondary | ICD-10-CM | POA: Diagnosis not present

## 2020-01-13 DIAGNOSIS — I10 Essential (primary) hypertension: Secondary | ICD-10-CM | POA: Diagnosis not present

## 2020-01-13 DIAGNOSIS — K219 Gastro-esophageal reflux disease without esophagitis: Secondary | ICD-10-CM | POA: Diagnosis not present

## 2020-01-13 DIAGNOSIS — C73 Malignant neoplasm of thyroid gland: Secondary | ICD-10-CM | POA: Diagnosis not present

## 2020-01-13 DIAGNOSIS — E039 Hypothyroidism, unspecified: Secondary | ICD-10-CM | POA: Diagnosis not present

## 2020-01-13 DIAGNOSIS — Z9884 Bariatric surgery status: Secondary | ICD-10-CM | POA: Diagnosis not present

## 2020-01-13 DIAGNOSIS — Z471 Aftercare following joint replacement surgery: Secondary | ICD-10-CM | POA: Diagnosis not present

## 2020-01-13 DIAGNOSIS — Z7982 Long term (current) use of aspirin: Secondary | ICD-10-CM | POA: Diagnosis not present

## 2020-01-13 NOTE — Progress Notes (Signed)
HPI: [name] is now 3 weeks status post right total hip arthroplasty performed by Dr. Erlinda Hong she comes in today due to fluid reaccumulation from a seroma in the morning is aspirated.  She is otherwise doing very well.  She is ambulating with a cane.  She had no fevers chills shortness of breath chest pain.  Physical exam right hip surgical incision healing well no signs of infection.  Positive seroma.  Hips prepped with Betadine and ethyl chloride was used and then 170 cc of serosanguineous fluid is aspirated patient tolerates well.   Impression: Status post right total hip arthroplasty 12/23/2019  Plan: She will follow-up with Dr. Erlinda Hong as scheduled.  Follow-up sooner if there is a reaccumulation of fluid.  Questions were encouraged and answered.

## 2020-01-16 ENCOUNTER — Encounter: Payer: Self-pay | Admitting: Family Medicine

## 2020-01-16 DIAGNOSIS — C73 Malignant neoplasm of thyroid gland: Secondary | ICD-10-CM | POA: Diagnosis not present

## 2020-01-16 DIAGNOSIS — K219 Gastro-esophageal reflux disease without esophagitis: Secondary | ICD-10-CM | POA: Diagnosis not present

## 2020-01-16 DIAGNOSIS — Z9884 Bariatric surgery status: Secondary | ICD-10-CM | POA: Diagnosis not present

## 2020-01-16 DIAGNOSIS — I10 Essential (primary) hypertension: Secondary | ICD-10-CM | POA: Diagnosis not present

## 2020-01-16 DIAGNOSIS — M1990 Unspecified osteoarthritis, unspecified site: Secondary | ICD-10-CM | POA: Diagnosis not present

## 2020-01-16 DIAGNOSIS — Z7982 Long term (current) use of aspirin: Secondary | ICD-10-CM | POA: Diagnosis not present

## 2020-01-16 DIAGNOSIS — Z471 Aftercare following joint replacement surgery: Secondary | ICD-10-CM | POA: Diagnosis not present

## 2020-01-16 DIAGNOSIS — Z96641 Presence of right artificial hip joint: Secondary | ICD-10-CM | POA: Diagnosis not present

## 2020-01-16 DIAGNOSIS — E039 Hypothyroidism, unspecified: Secondary | ICD-10-CM | POA: Diagnosis not present

## 2020-01-23 ENCOUNTER — Other Ambulatory Visit: Payer: Self-pay | Admitting: Family Medicine

## 2020-02-04 ENCOUNTER — Ambulatory Visit: Payer: Medicare HMO | Admitting: Orthopaedic Surgery

## 2020-02-05 ENCOUNTER — Encounter: Payer: Self-pay | Admitting: Orthopaedic Surgery

## 2020-02-05 ENCOUNTER — Ambulatory Visit: Payer: Medicare HMO | Admitting: Orthopaedic Surgery

## 2020-02-05 ENCOUNTER — Other Ambulatory Visit: Payer: Self-pay

## 2020-02-05 ENCOUNTER — Ambulatory Visit: Payer: Medicare HMO

## 2020-02-05 ENCOUNTER — Ambulatory Visit (INDEPENDENT_AMBULATORY_CARE_PROVIDER_SITE_OTHER): Payer: Medicare HMO

## 2020-02-05 VITALS — Ht 67.0 in | Wt 171.0 lb

## 2020-02-05 DIAGNOSIS — Z96641 Presence of right artificial hip joint: Secondary | ICD-10-CM

## 2020-02-05 NOTE — Progress Notes (Signed)
Post-Op Visit Note   Patient: [name]           Date of Birth: 26-Mar-1946           MRN: [id number] Visit Date: 02/05/2020 PCP: Mikey Kirschner, MD   Assessment & Plan:  Chief Complaint:  Chief Complaint  Patient presents with  . Right Hip - Follow-up    Right total hip arthroplasty DOS 12/23/2019   Visit Diagnoses:  1. Status post total replacement of right hip     Plan: Patient is a pleasant 73 year old female who comes in today 6 weeks out right total hip replacement 12/23/2019.  She has been doing well.  No complaints of pain.  She has finished physical therapy where she is ambulating unassisted.  She has had 2 seromas aspirated with the last one being approximately 3 weeks ago.  Examination of her right hip today reveals a small seroma.  This is soft and nontender.  No evidence of infection or cellulitis.  She does have a few suture knots that we will pull today.  I do not believe her seroma is big enough to aspirate.  She will continue to advance with activity as tolerated.  Follow-up with [place] in 6 weeks time for repeat evaluation.  Call with concerns or questions in the meantime.  Follow-Up Instructions: Return in about 6 weeks (around 03/18/2020).   Orders:  Orders Placed This Encounter  Procedures  . XR HIP UNILAT W OR W/O PELVIS 2-3 VIEWS RIGHT   No orders of the defined types were placed in this encounter.   Imaging: XR HIP UNILAT W OR W/O PELVIS 2-3 VIEWS RIGHT  Result Date: 02/05/2020 Well-seated prosthesis without complication   PMFS History: Patient Active Problem List   Diagnosis Date Noted  . Status post total replacement of right hip 12/23/2019  . Primary osteoarthritis of right hip 12/07/2019  . Chest pain 12/27/2016  . Hypothyroidism 05/22/2016  . Essential hypertension, benign 09/09/2013  . Hyperlipidemia LDL goal <130 09/09/2013  . Esophageal reflux 09/09/2013   Past Medical History:  Diagnosis Date  . Arthritis   . Cancer [facility])    Thyroid cancer  . GERD (gastroesophageal reflux disease)   . History of kidney stones   . Hypercholesteremia    resolved after gastric bypass  . Hypertension   . Hypothyroidism     Family History  Problem Relation Age of Onset  . Pneumonia Mother 15  . CAD Father 32  . CAD Sister 79  . CAD Brother 76  . Other Maternal Grandmother 96  . Other Brother 93       drug overdose  . CAD Other 8  . Colon cancer Neg Hx     Past Surgical History:  Procedure Laterality Date  . ABDOMINAL HYSTERECTOMY    . BILATERAL SALPINGOOPHORECTOMY    . BREAST EXCISIONAL BIOPSY Bilateral 1995   benign  . CHOLECYSTECTOMY    . COLONOSCOPY  11/14/2011   Procedure: COLONOSCOPY;  Surgeon: Daneil Dolin, MD;  [facility];  Service: Endoscopy;  Laterality: N/A;  . REPLACEMENT TOTAL KNEE BILATERAL    . ROUX-EN-Y PROCEDURE  2007  . TONSILLECTOMY    . TOTAL HIP ARTHROPLASTY Right 12/23/2019   Procedure: RIGHT TOTAL HIP ARTHROPLASTY ANTERIOR APPROACH;  Surgeon: Leandrew Koyanagi, MD;  Location: [place];  Service: Orthopedics;  Laterality: Right;  . TOTAL THYROIDECTOMY  12/2010  . TUBAL LIGATION     Social History   Occupational History  .  Occupation: Retired Therapist, art  Tobacco Use  . Smoking status: Never Smoker  . Smokeless tobacco: Never Used  Substance and Sexual Activity  . Alcohol use: No  . Drug use: No  . Sexual activity: Not on file

## 2020-02-06 DIAGNOSIS — R69 Illness, unspecified: Secondary | ICD-10-CM | POA: Diagnosis not present

## 2020-02-27 DIAGNOSIS — R69 Illness, unspecified: Secondary | ICD-10-CM | POA: Diagnosis not present

## 2020-03-18 ENCOUNTER — Ambulatory Visit: Payer: Medicare HMO

## 2020-03-19 ENCOUNTER — Other Ambulatory Visit: Payer: Self-pay

## 2020-03-19 ENCOUNTER — Ambulatory Visit: Payer: Medicare HMO | Admitting: Orthopaedic Surgery

## 2020-03-19 ENCOUNTER — Encounter: Payer: Self-pay | Admitting: Orthopaedic Surgery

## 2020-03-19 DIAGNOSIS — Z96641 Presence of right artificial hip joint: Secondary | ICD-10-CM

## 2020-03-19 NOTE — Progress Notes (Signed)
   Post-Op Visit Note   Patient: [name]           Date of Birth: 1946-06-01           MRN: [id number] Visit Date: 03/19/2020 PCP: Mikey Kirschner, MD   Assessment & Plan:  Chief Complaint:  Chief Complaint  Patient presents with  . Right Hip - Pain   Visit Diagnoses:  1. Status post total hip replacement, right     Plan: Patient is a pleasant 74 year old female who comes in today 12 weeks out right total hip arthroplasty on 12/23/2019.  She has been doing very well.  No pain and no other complaints.  Examination of her right hip reveals full range of motion strength.  She is neurovascularly intact distally.  At this point, she will continue to advance with activity as tolerated.  Dental prophylaxis reinforced.  Follow-up with [place] in 9 months time for repeat evaluation and AP pelvis lateral right hip x-rays.  Call with concerns or questions meantime  Follow-Up Instructions: Return in about 9 months (around 12/19/2020).   Orders:  No orders of the defined types were placed in this encounter.  No orders of the defined types were placed in this encounter.   Imaging: No new imaging  PMFS History: Patient Active Problem List   Diagnosis Date Noted  . Status post total replacement of right hip 12/23/2019  . Primary osteoarthritis of right hip 12/07/2019  . Chest pain 12/27/2016  . Hypothyroidism 05/22/2016  . Essential hypertension, benign 09/09/2013  . Hyperlipidemia LDL goal <130 09/09/2013  . Esophageal reflux 09/09/2013   Past Medical History:  Diagnosis Date  . Arthritis   . Cancer [facility])    Thyroid cancer  . GERD (gastroesophageal reflux disease)   . History of kidney stones   . Hypercholesteremia    resolved after gastric bypass  . Hypertension   . Hypothyroidism     Family History  Problem Relation Age of Onset  . Pneumonia Mother 30  . CAD Father 65  . CAD Sister 34  . CAD Brother 65  . Other Maternal Grandmother 96  . Other Brother 35   drug overdose  . CAD Other 75  . Colon cancer Neg Hx     Past Surgical History:  Procedure Laterality Date  . ABDOMINAL HYSTERECTOMY    . BILATERAL SALPINGOOPHORECTOMY    . BREAST EXCISIONAL BIOPSY Bilateral 1995   benign  . CHOLECYSTECTOMY    . COLONOSCOPY  11/14/2011   Procedure: COLONOSCOPY;  Surgeon: Daneil Dolin, MD;  Location[facility];  Service: Endoscopy;  Laterality: N/A;  . REPLACEMENT TOTAL KNEE BILATERAL    . ROUX-EN-Y PROCEDURE  2007  . TONSILLECTOMY    . TOTAL HIP ARTHROPLASTY Right 12/23/2019   Procedure: RIGHT TOTAL HIP ARTHROPLASTY ANTERIOR APPROACH;  Surgeon: Leandrew Koyanagi, MD;  Location: [place];  Service: Orthopedics;  Laterality: Right;  . TOTAL THYROIDECTOMY  12/2010  . TUBAL LIGATION     Social History   Occupational History  . Occupation: Retired Therapist, art  Tobacco Use  . Smoking status: Never Smoker  . Smokeless tobacco: Never Used  Substance and Sexual Activity  . Alcohol use: No  . Drug use: No  . Sexual activity: Not on file

## 2020-04-06 DIAGNOSIS — E890 Postprocedural hypothyroidism: Secondary | ICD-10-CM | POA: Diagnosis not present

## 2020-04-06 DIAGNOSIS — Z85850 Personal history of malignant neoplasm of thyroid: Secondary | ICD-10-CM | POA: Diagnosis not present

## 2020-04-09 DIAGNOSIS — R69 Illness, unspecified: Secondary | ICD-10-CM | POA: Diagnosis not present

## 2020-04-15 ENCOUNTER — Other Ambulatory Visit: Payer: Self-pay

## 2020-04-15 ENCOUNTER — Ambulatory Visit
Admission: RE | Admit: 2020-04-15 | Discharge: 2020-04-15 | Disposition: A | Discharge status: Home / Self Care | Payer: Medicare HMO | Source: Ambulatory Visit | Attending: Family Medicine | Admitting: Family Medicine

## 2020-04-15 DIAGNOSIS — Z1231 Encounter for screening mammogram for malignant neoplasm of breast: Secondary | ICD-10-CM | POA: Diagnosis not present

## 2020-04-16 ENCOUNTER — Other Ambulatory Visit: Payer: Self-pay | Admitting: Family Medicine

## 2020-04-16 DIAGNOSIS — R928 Other abnormal and inconclusive findings on diagnostic imaging of breast: Secondary | ICD-10-CM

## 2020-04-20 ENCOUNTER — Ambulatory Visit (INDEPENDENT_AMBULATORY_CARE_PROVIDER_SITE_OTHER): Payer: Medicare HMO | Admitting: Family Medicine

## 2020-04-20 ENCOUNTER — Encounter: Payer: Self-pay | Admitting: Family Medicine

## 2020-04-20 ENCOUNTER — Other Ambulatory Visit: Payer: Self-pay

## 2020-04-20 VITALS — BP 160/90 | Temp 94.5°F | Wt 172.6 lb

## 2020-04-20 DIAGNOSIS — Z79899 Other long term (current) drug therapy: Secondary | ICD-10-CM | POA: Diagnosis not present

## 2020-04-20 DIAGNOSIS — I10 Essential (primary) hypertension: Secondary | ICD-10-CM

## 2020-04-20 DIAGNOSIS — D510 Vitamin B12 deficiency anemia due to intrinsic factor deficiency: Secondary | ICD-10-CM | POA: Diagnosis not present

## 2020-04-20 DIAGNOSIS — E785 Hyperlipidemia, unspecified: Secondary | ICD-10-CM | POA: Diagnosis not present

## 2020-04-20 MED ORDER — ENALAPRIL MALEATE 5 MG PO TABS: 5.0000 mg | ORAL_TABLET | Freq: Every day | ORAL | 1 refills | Status: DC

## 2020-04-20 MED ORDER — FAMOTIDINE 40 MG PO TABS: ORAL_TABLET | ORAL | 1 refills | Status: DC

## 2020-04-20 NOTE — Progress Notes (Signed)
   Subjective:    Patient ID: [name], female    DOB: Sep 23, 1946, 74 y.o.   MRN: [id number]  Hypertension This is a chronic problem. There are no compliance problems.   Pt here today for follow up . Pt states she does not check blood pressure often but is not having any issues.  Pt is needing refills of Enalapril 5 mg daily and Famotide 40 mg daily.    Blood pressure medicine and blood pressure levels reviewed today with patient. Compliant with blood pressure medicine. States does not miss a dose. No obvious side effects. Blood pressure generally good when checked elsewhere. Watching salt intake.  Still gets heartburn evey once in a while    Hits up front and gets nauseated   tums helps    Pt has area on lip (venous pool). Itchy and rough feeling. Has changed some over time. Bottom lip.  Had mammogram done last week and has to go back for ultrasound of left breast. [facility]  Blood pressure medicine and blood pressure levels reviewed today with patient. Compliant with blood pressure medicine. States does not miss a dose. No obvious side effects. Blood pressure generally good when checked elsewhere. Watching salt intake.      Review of Systems No headache, no major weight loss or weight gain, no chest pain no back pain abdominal pain no change in bowel habits complete ROS otherwise negative     Objective:   Physical Exam   Alert vitals stable, NAD. Blood pressure good on repeat. HEENT normal. Lungs clear. Heart regular rate and rhythm. X-ray of lip shows chronic venous lake/no abnormal changes     Assessment & Plan:  Impression hypertension.  Good control discussed to maintain same meds  2.  Reflux.  Clinically stable on meds  3.  Venous lake at edge of lip discussed  4.  Blood work to do for status post bariatric procedure.  Ordered

## 2020-04-21 DIAGNOSIS — Z85850 Personal history of malignant neoplasm of thyroid: Secondary | ICD-10-CM | POA: Diagnosis not present

## 2020-04-21 DIAGNOSIS — Z008 Encounter for other general examination: Secondary | ICD-10-CM | POA: Diagnosis not present

## 2020-04-21 DIAGNOSIS — Z803 Family history of malignant neoplasm of breast: Secondary | ICD-10-CM | POA: Diagnosis not present

## 2020-04-21 DIAGNOSIS — E039 Hypothyroidism, unspecified: Secondary | ICD-10-CM | POA: Diagnosis not present

## 2020-04-21 DIAGNOSIS — Z885 Allergy status to narcotic agent status: Secondary | ICD-10-CM | POA: Diagnosis not present

## 2020-04-21 DIAGNOSIS — I10 Essential (primary) hypertension: Secondary | ICD-10-CM | POA: Diagnosis not present

## 2020-04-21 DIAGNOSIS — R32 Unspecified urinary incontinence: Secondary | ICD-10-CM | POA: Diagnosis not present

## 2020-04-21 DIAGNOSIS — Z8249 Family history of ischemic heart disease and other diseases of the circulatory system: Secondary | ICD-10-CM | POA: Diagnosis not present

## 2020-04-21 DIAGNOSIS — K219 Gastro-esophageal reflux disease without esophagitis: Secondary | ICD-10-CM | POA: Diagnosis not present

## 2020-04-21 DIAGNOSIS — I739 Peripheral vascular disease, unspecified: Secondary | ICD-10-CM | POA: Diagnosis not present

## 2020-04-21 DIAGNOSIS — R69 Illness, unspecified: Secondary | ICD-10-CM | POA: Diagnosis not present

## 2020-04-22 ENCOUNTER — Ambulatory Visit: Payer: Medicare HMO

## 2020-04-22 ENCOUNTER — Ambulatory Visit
Admission: RE | Admit: 2020-04-22 | Discharge: 2020-04-22 | Disposition: A | Discharge status: Home / Self Care | Payer: Medicare HMO | Source: Ambulatory Visit | Attending: Family Medicine | Admitting: Family Medicine

## 2020-04-22 ENCOUNTER — Other Ambulatory Visit: Payer: Self-pay

## 2020-04-22 DIAGNOSIS — R928 Other abnormal and inconclusive findings on diagnostic imaging of breast: Secondary | ICD-10-CM

## 2020-05-06 DIAGNOSIS — I10 Essential (primary) hypertension: Secondary | ICD-10-CM | POA: Diagnosis not present

## 2020-05-06 DIAGNOSIS — D510 Vitamin B12 deficiency anemia due to intrinsic factor deficiency: Secondary | ICD-10-CM | POA: Diagnosis not present

## 2020-05-06 DIAGNOSIS — E785 Hyperlipidemia, unspecified: Secondary | ICD-10-CM | POA: Diagnosis not present

## 2020-05-06 DIAGNOSIS — Z79899 Other long term (current) drug therapy: Secondary | ICD-10-CM | POA: Diagnosis not present

## 2020-05-07 LAB — HEPATIC FUNCTION PANEL
ALT: 10 IU/L (ref 0–32)
AST: 13 IU/L (ref 0–40)
Albumin: 4 g/dL (ref 3.7–4.7)
Alkaline Phosphatase: 74 IU/L (ref 48–121)
Bilirubin Total: 0.6 mg/dL (ref 0.0–1.2)
Bilirubin, Direct: 0.14 mg/dL (ref 0.00–0.40)
Total Protein: 6.1 g/dL (ref 6.0–8.5)

## 2020-05-07 LAB — BASIC METABOLIC PANEL
BUN/Creatinine Ratio: 22 (ref 12–28)
BUN: 19 mg/dL (ref 8–27)
CO2: 19 mmol/L — ABNORMAL LOW (ref 20–29)
Calcium: 8.9 mg/dL (ref 8.7–10.3)
Chloride: 111 mmol/L — ABNORMAL HIGH (ref 96–106)
Creatinine, Ser: 0.88 mg/dL (ref 0.57–1.00)
GFR calc Af Amer: 75 mL/min/{1.73_m2} (ref >59)
GFR calc non Af Amer: 65 mL/min/{1.73_m2} (ref >59)
Glucose: 81 mg/dL (ref 65–99)
Potassium: 4.4 mmol/L (ref 3.5–5.2)
Sodium: 142 mmol/L (ref 134–144)

## 2020-05-07 LAB — LIPID PANEL
Chol/HDL Ratio: 2.6 ratio (ref 0.0–4.4)
Cholesterol, Total: 169 mg/dL (ref 100–199)
HDL: 66 mg/dL (ref >39)
LDL Chol Calc (NIH): 89 mg/dL (ref 0–99)
Triglycerides: 71 mg/dL (ref 0–149)
VLDL Cholesterol Cal: 14 mg/dL (ref 5–40)

## 2020-05-07 LAB — CBC WITH DIFFERENTIAL/PLATELET
Basophils Absolute: 0 10*3/uL (ref 0.0–0.2)
Basos: 1 %
EOS (ABSOLUTE): 0.3 10*3/uL (ref 0.0–0.4)
Eos: 6 %
Hematocrit: 39.4 % (ref 34.0–46.6)
Hemoglobin: 12.7 g/dL (ref 11.1–15.9)
Immature Grans (Abs): 0 10*3/uL (ref 0.0–0.1)
Immature Granulocytes: 0 %
Lymphocytes Absolute: 1.7 10*3/uL (ref 0.7–3.1)
Lymphs: 38 %
MCH: 28.3 pg (ref 26.6–33.0)
MCHC: 32.2 g/dL (ref 31.5–35.7)
MCV: 88 fL (ref 79–97)
Monocytes Absolute: 0.4 10*3/uL (ref 0.1–0.9)
Monocytes: 8 %
Neutrophils Absolute: 2.1 10*3/uL (ref 1.4–7.0)
Neutrophils: 47 %
Platelets: 219 10*3/uL (ref 150–450)
RBC: 4.49 x10E6/uL (ref 3.77–5.28)
RDW: 15.9 % — ABNORMAL HIGH (ref 11.7–15.4)
WBC: 4.5 10*3/uL (ref 3.4–10.8)

## 2020-05-07 LAB — VITAMIN D 25 HYDROXY (VIT D DEFICIENCY, FRACTURES): Vit D, 25-Hydroxy: 29.2 ng/mL — ABNORMAL LOW (ref 30.0–100.0)

## 2020-05-07 LAB — VITAMIN B12: Vitamin B-12: 124 pg/mL — ABNORMAL LOW (ref 232–1245)

## 2020-05-13 DIAGNOSIS — R69 Illness, unspecified: Secondary | ICD-10-CM | POA: Diagnosis not present

## 2020-08-12 DIAGNOSIS — Z0100 Encounter for examination of eyes and vision without abnormal findings: Secondary | ICD-10-CM | POA: Diagnosis not present

## 2020-08-19 DIAGNOSIS — Z0100 Encounter for examination of eyes and vision without abnormal findings: Secondary | ICD-10-CM | POA: Diagnosis not present

## 2020-09-19 ENCOUNTER — Other Ambulatory Visit (INDEPENDENT_AMBULATORY_CARE_PROVIDER_SITE_OTHER): Payer: Medicare HMO | Admitting: *Deleted

## 2020-09-19 DIAGNOSIS — Z23 Encounter for immunization: Secondary | ICD-10-CM | POA: Diagnosis not present

## 2020-10-22 ENCOUNTER — Encounter: Payer: Self-pay | Admitting: Family Medicine

## 2020-10-22 ENCOUNTER — Ambulatory Visit (INDEPENDENT_AMBULATORY_CARE_PROVIDER_SITE_OTHER): Payer: Medicare HMO | Admitting: Family Medicine

## 2020-10-22 ENCOUNTER — Other Ambulatory Visit: Payer: Self-pay

## 2020-10-22 VITALS — BP 144/90 | HR 80 | Temp 97.2°F | Ht 67.0 in | Wt 172.2 lb

## 2020-10-22 DIAGNOSIS — I10 Essential (primary) hypertension: Secondary | ICD-10-CM | POA: Diagnosis not present

## 2020-10-22 DIAGNOSIS — E785 Hyperlipidemia, unspecified: Secondary | ICD-10-CM

## 2020-10-22 DIAGNOSIS — Z85850 Personal history of malignant neoplasm of thyroid: Secondary | ICD-10-CM | POA: Diagnosis not present

## 2020-10-22 DIAGNOSIS — Z9884 Bariatric surgery status: Secondary | ICD-10-CM

## 2020-10-22 DIAGNOSIS — K219 Gastro-esophageal reflux disease without esophagitis: Secondary | ICD-10-CM | POA: Diagnosis not present

## 2020-10-22 MED ORDER — ENALAPRIL MALEATE 10 MG PO TABS: 10.0000 mg | ORAL_TABLET | Freq: Every day | ORAL | 1 refills | Status: DC

## 2020-10-22 MED ORDER — FAMOTIDINE 40 MG PO TABS: ORAL_TABLET | ORAL | 1 refills | Status: DC

## 2020-10-22 NOTE — Progress Notes (Signed)
Patient ID: [name], female    DOB: 08-20-1946, 74 y.o.   MRN: [id number]   Chief Complaint  Patient presents with  . Hypertension    hyperlipidemia follow up- needs refills today- discuss shingles vaccine   Subjective:    HPI F/u for htn and HLD.  Has h/o gastric bypass in past.  Has low vit d, b12, and calcium and folic acid.  Seeing endocrinologist and taking care of hypothyroid and in 2012 h/o thyroid cancer.  Had radiation and surgery.   Due 2022- colonscopy. Never had shingles vaccine.  Pt to go to pharmacy. Pt needing moderna booster.  H/o Systolic murmur. Had echo in 2018, due to chest pain and was admitted, echo was overall normal. Normal EF. And mildly calcified AV. Dr. Harl Bowie, cards heard systolic mumur 3/6 at [facility].    Medical History [name] has a past medical history of Arthritis, Cancer ([place]), GERD (gastroesophageal reflux disease), History of kidney stones, Hypercholesteremia, Hypertension, and Hypothyroidism.   Outpatient Encounter Medications as of 10/22/2020  Medication Sig  . calcium citrate (CALCITRATE - DOSED IN MG ELEMENTAL CALCIUM) 950 MG tablet Take 1 tablet by mouth 2 (two) times daily.   . Cholecalciferol (VITAMIN D) 50 MCG (2000 UT) CAPS Take 2,000 Units by mouth 3 (three) times daily.  . enalapril (VASOTEC) 10 MG tablet Take 1 tablet (10 mg total) by mouth at bedtime.  . famotidine (PEPCID) 40 MG tablet TAKE 1 TABLET DAILY (CANCELRANTITIDINE)  . ferrous sulfate 325 (65 FE) MG tablet Take 325 mg by mouth daily with breakfast.  . folic acid (FOLVITE) 536 MCG tablet Take 800 mcg by mouth daily.   Marland Kitchen levothyroxine (SYNTHROID) 150 MCG tablet Take 1 tablet (150 mcg total) by mouth daily before breakfast.  . MAGNESIUM-ZINC PO Take 2 tablets by mouth daily.  . Multiple Vitamins-Minerals (MULTIVITAMINS THER. W/MINERALS) TABS Take 1 tablet by mouth 2 (two) times daily.    . valACYclovir (VALTREX) 1000 MG tablet TAKE 2 TABLETS AT FIRST SIGN OF  FEVER BLISTER, THEN 2 TABLETS 2 HOURSLATER (Patient taking differently: Take 1,000 mg by mouth 2 (two) times daily as needed (fever blister). )  . vitamin B-12 (CYANOCOBALAMIN) 500 MCG tablet Take 500 mcg by mouth daily.   . [DISCONTINUED] aspirin EC 81 MG tablet Take 1 tablet (81 mg total) by mouth 2 (two) times daily.  . [DISCONTINUED] cephALEXin (KEFLEX) 500 MG capsule Take 1 capsule (500 mg total) by mouth 4 (four) times daily.  . [DISCONTINUED] enalapril (VASOTEC) 5 MG tablet Take 1 tablet (5 mg total) by mouth at bedtime.  . [DISCONTINUED] famotidine (PEPCID) 40 MG tablet TAKE 1 TABLET DAILY (CANCELRANTITIDINE)  . [DISCONTINUED] HYDROcodone-acetaminophen (NORCO) 5-325 MG tablet Take 1-2 tablets by mouth 2 (two) times daily as needed for moderate pain.  . [DISCONTINUED] Melatonin 10 MG TABS Take 20 mg by mouth at bedtime.  . [DISCONTINUED] methocarbamol (ROBAXIN) 750 MG tablet Take 1 tablet (750 mg total) by mouth 2 (two) times daily as needed for muscle spasms.  . [DISCONTINUED] ondansetron (ZOFRAN) 4 MG tablet Take 1-2 tablets (4-8 mg total) by mouth every 8 (eight) hours as needed for nausea or vomiting.  . [DISCONTINUED] oxyCODONE (OXY IR/ROXICODONE) 5 MG immediate release tablet Take 1-2 tablets (5-10 mg total) by mouth every 8 (eight) hours as needed for severe pain.   No facility-administered encounter medications on file as of 10/22/2020.     Review of Systems  Constitutional: Negative for chills and fever.  HENT: Negative  for congestion, rhinorrhea and sore throat.   Respiratory: Negative for cough, shortness of breath and wheezing.   Cardiovascular: Negative for chest pain and leg swelling.  Gastrointestinal: Negative for abdominal pain, diarrhea, nausea and vomiting.  Genitourinary: Negative for dysuria and frequency.  Musculoskeletal: Negative for arthralgias and back pain.  Skin: Negative for rash.  Neurological: Negative for dizziness, weakness and headaches.      Vitals BP (!) 144/90   Pulse 80   Temp (!) 97.2 F (36.2 C) (Oral)   Ht 5\' 7"  (1.702 m)   Wt 172 lb 3.2 oz (78.1 kg)   SpO2 99%   BMI 26.97 kg/m   Objective:   Physical Exam Vitals and nursing note reviewed.  Constitutional:      Appearance: Normal appearance.  HENT:     Head: Normocephalic and atraumatic.     Nose: Nose normal.     Mouth/Throat:     Mouth: Mucous membranes are moist.     Pharynx: Oropharynx is clear.  Eyes:     Extraocular Movements: Extraocular movements intact.     Conjunctiva/sclera: Conjunctivae normal.     Pupils: Pupils are equal, round, and reactive to light.  Cardiovascular:     Rate and Rhythm: Normal rate and regular rhythm.     Pulses: Normal pulses.     Heart sounds: Murmur (systolic 4/6, rusb) heard.   Pulmonary:     Effort: Pulmonary effort is normal.     Breath sounds: Normal breath sounds. No wheezing, rhonchi or rales.  Musculoskeletal:        General: Normal range of motion.     Right lower leg: No edema.     Left lower leg: No edema.  Skin:    General: Skin is warm and dry.     Findings: No lesion or rash.  Neurological:     General: No focal deficit present.     Mental Status: She is alert and oriented to person, place, and time.  Psychiatric:        Mood and Affect: Mood normal.        Behavior: Behavior normal.      Assessment and Plan   1. Essential hypertension, benign - enalapril (VASOTEC) 10 MG tablet; Take 1 tablet (10 mg total) by mouth at bedtime.  Dispense: 90 tablet; Refill: 1  2. History of thyroid cancer  3. Hyperlipidemia LDL goal <130  4. Gastroesophageal reflux disease without esophagitis - famotidine (PEPCID) 40 MG tablet; TAKE 1 TABLET DAILY (CANCELRANTITIDINE)  Dispense: 90 tablet; Refill: 1  5. History of gastric bypass   H/o systolic murmur- Had echo 2018.   HM- utd on flu vaccine. Waiting to get covid booster  Going to get shingles vaccine.  gerd- Having reflux and taking  tums and pepcid.  Controlling it.  On recheck of bp still elevated-  Inc enalapril from 5mg  to 10mg . Call if seeing numbers under 100/70.  Pt in agreemnt. Last labs 5/21. Will recheck labs on next visit.  F/u 2mo.

## 2020-10-29 DIAGNOSIS — E890 Postprocedural hypothyroidism: Secondary | ICD-10-CM | POA: Diagnosis not present

## 2020-10-29 DIAGNOSIS — Z9884 Bariatric surgery status: Secondary | ICD-10-CM | POA: Diagnosis not present

## 2020-10-29 DIAGNOSIS — Z85850 Personal history of malignant neoplasm of thyroid: Secondary | ICD-10-CM | POA: Diagnosis not present

## 2020-10-29 DIAGNOSIS — M810 Age-related osteoporosis without current pathological fracture: Secondary | ICD-10-CM | POA: Diagnosis not present

## 2020-11-02 DIAGNOSIS — Z9884 Bariatric surgery status: Secondary | ICD-10-CM | POA: Insufficient documentation

## 2020-12-12 DIAGNOSIS — U071 COVID-19: Secondary | ICD-10-CM | POA: Insufficient documentation

## 2020-12-12 HISTORY — DX: COVID-19: U07.1

## 2020-12-22 ENCOUNTER — Ambulatory Visit (INDEPENDENT_AMBULATORY_CARE_PROVIDER_SITE_OTHER): Payer: Medicare HMO

## 2020-12-22 ENCOUNTER — Ambulatory Visit: Payer: Self-pay

## 2020-12-22 ENCOUNTER — Ambulatory Visit: Payer: Medicare HMO | Admitting: Orthopaedic Surgery

## 2020-12-22 ENCOUNTER — Encounter: Payer: Self-pay | Admitting: Orthopaedic Surgery

## 2020-12-22 DIAGNOSIS — M1612 Unilateral primary osteoarthritis, left hip: Secondary | ICD-10-CM | POA: Insufficient documentation

## 2020-12-22 DIAGNOSIS — Z96641 Presence of right artificial hip joint: Secondary | ICD-10-CM

## 2020-12-22 NOTE — Progress Notes (Signed)
Office Visit Note   Patient: [name]           Date of Birth: August 05, 1946           MRN: [id number] Visit Date: 12/22/2020              Requested by: Mikey Kirschner, MD No address on file PCP: Erven Colla, DO   Assessment & Plan: Visit Diagnoses:  1. Status post total hip replacement, right   2. Primary osteoarthritis of left hip     Plan: Impression is 1 year status post right total hip replacement doing well without any problems and moderately severe left hip DJD without significant pain.  Dental prophylaxis reinforced today.  She will continue to take over-the-counter medications for the left hip as needed.  Questions encouraged and answered.  Recheck in a year with standing AP pelvis x-rays.  Follow-Up Instructions: Return in about 1 year (around 12/22/2021).   Orders:  Orders Placed This Encounter  Procedures  . XR HIP UNILAT W OR W/O PELVIS 2-3 VIEWS RIGHT  . XR HIP UNILAT W OR W/O PELVIS 2-3 VIEWS LEFT   No orders of the defined types were placed in this encounter.     Procedures: No procedures performed   Clinical Data: No additional findings.   Subjective: Chief Complaint  Patient presents with  . Right Hip - Follow-up  . Left Hip - Pain    [name] is 1 year status post right total hip replacement and also following up for a left hip DJD.  In terms of the right hip she is very happy and doing great has no complaints.  The left hip bothers her occasionally and wants to have this looked at.  She takes Tylenol as needed.   Review of Systems   Objective: Vital Signs: There were no vitals taken for this visit.  Physical Exam  Ortho Exam Right hip shows a fully healed surgical scar.  Excellent range of motion without pain.  Left hip shows mild restriction in range of motion with mild pain. Specialty Comments:  No specialty comments available.  Imaging: XR HIP UNILAT W OR W/O PELVIS 2-3 VIEWS LEFT  Result Date: 12/22/2020 Stable  moderately severe DJD.  No significant progression compared to prior x-ray from a year ago.  XR HIP UNILAT W OR W/O PELVIS 2-3 VIEWS RIGHT  Result Date: 12/22/2020 Stable right total hip replacement without complication.    PMFS History: Patient Active Problem List   Diagnosis Date Noted  . Primary osteoarthritis of left hip 12/22/2020  . Status post total hip replacement, right 12/22/2020  . History of gastric bypass 11/02/2020  . History of thyroid cancer 10/22/2020  . Status post total replacement of right hip 12/23/2019  . Primary osteoarthritis of right hip 12/07/2019  . Chest pain 12/27/2016  . Hypothyroidism 05/22/2016  . Essential hypertension, benign 09/09/2013  . Hyperlipidemia LDL goal <130 09/09/2013  . Esophageal reflux 09/09/2013   Past Medical History:  Diagnosis Date  . Arthritis   . Cancer [facility])    Thyroid cancer  . GERD (gastroesophageal reflux disease)   . History of kidney stones   . Hypercholesteremia    resolved after gastric bypass  . Hypertension   . Hypothyroidism     Family History  Problem Relation Age of Onset  . Pneumonia Mother 50  . CAD Father 67  . CAD Sister 43  . CAD Brother 104  . Other Maternal Grandmother 96  .  Other Brother 12       drug overdose  . CAD Other 41  . Colon cancer Neg Hx     Past Surgical History:  Procedure Laterality Date  . ABDOMINAL HYSTERECTOMY    . BILATERAL SALPINGOOPHORECTOMY    . BREAST EXCISIONAL BIOPSY Bilateral 1995   benign  . CHOLECYSTECTOMY    . COLONOSCOPY  11/14/2011   Procedure: COLONOSCOPY;  Surgeon: Daneil Dolin, MD;  [facility];  Service: Endoscopy;  Laterality: N/A;  . REPLACEMENT TOTAL KNEE BILATERAL    . ROUX-EN-Y PROCEDURE  2007  . TONSILLECTOMY    . TOTAL HIP ARTHROPLASTY Right 12/23/2019   Procedure: RIGHT TOTAL HIP ARTHROPLASTY ANTERIOR APPROACH;  Surgeon: Leandrew Koyanagi, MD;  Location: [place];  Service: Orthopedics;  Laterality: Right;  . TOTAL THYROIDECTOMY  12/2010   . TUBAL LIGATION     Social History   Occupational History  . Occupation: Retired Therapist, art  Tobacco Use  . Smoking status: Never Smoker  . Smokeless tobacco: Never Used  Vaping Use  . Vaping Use: Never used  Substance and Sexual Activity  . Alcohol use: No  . Drug use: No  . Sexual activity: Not on file

## 2021-01-07 DIAGNOSIS — U071 COVID-19: Secondary | ICD-10-CM

## 2021-01-08 ENCOUNTER — Other Ambulatory Visit: Payer: Self-pay | Admitting: *Deleted

## 2021-01-08 DIAGNOSIS — U071 COVID-19: Secondary | ICD-10-CM

## 2021-01-08 NOTE — Telephone Encounter (Signed)
Called and discussed with pt. And referral put in

## 2021-01-09 ENCOUNTER — Other Ambulatory Visit: Payer: Self-pay | Admitting: Nurse Practitioner

## 2021-01-09 ENCOUNTER — Encounter: Payer: Self-pay | Admitting: Nurse Practitioner

## 2021-01-09 DIAGNOSIS — I10 Essential (primary) hypertension: Secondary | ICD-10-CM

## 2021-01-09 DIAGNOSIS — U071 COVID-19: Secondary | ICD-10-CM

## 2021-01-09 NOTE — Progress Notes (Signed)
I connected by phone with [name] on 01/09/2021 at 10:16 AM to discuss the potential use of a new treatment for mild to moderate COVID-19 viral infection in non-hospitalized patients.  This patient is a 75 y.o. female that meets the FDA criteria for Emergency Use Authorization of COVID monoclonal antibody sotrovimab.  Has a (+) direct SARS-CoV-2 viral test result  Has mild or moderate COVID-19   Is NOT hospitalized due to COVID-19  Is within 10 days of symptom onset  Has at least one of the high risk factor(s) for progression to severe COVID-19 and/or hospitalization as defined in EUA.  Specific high risk criteria : Older age (>/= 76 yo) and Cardiovascular disease or hypertension   I have spoken and communicated the following to the patient or parent/caregiver regarding COVID monoclonal antibody treatment:  1. FDA has authorized the emergency use for the treatment of mild to moderate COVID-19 in adults and pediatric patients with positive results of direct SARS-CoV-2 viral testing who are 75 years of age and older weighing at least 40 kg, and who are at high risk for progressing to severe COVID-19 and/or hospitalization.  2. The significant known and potential risks and benefits of COVID monoclonal antibody, and the extent to which such potential risks and benefits are unknown.  3. Information on available alternative treatments and the risks and benefits of those alternatives, including clinical trials.  4. Patients treated with COVID monoclonal antibody should continue to self-isolate and use infection control measures (e.g., wear mask, isolate, social distance, avoid sharing personal items, clean and disinfect "high touch" surfaces, and frequent handwashing) according to CDC guidelines.   5. The patient or parent/caregiver has the option to accept or refuse COVID monoclonal antibody treatment.  After reviewing this information with the patient, the patient has agreed to  receive one of the available covid 19 monoclonal antibodies and will be provided an appropriate fact sheet prior to infusion. Murray Hodgkins, NP 01/09/2021 10:16 AM

## 2021-01-11 ENCOUNTER — Other Ambulatory Visit (HOSPITAL_COMMUNITY): Payer: Self-pay

## 2021-01-11 ENCOUNTER — Ambulatory Visit (HOSPITAL_COMMUNITY)
Admission: RE | Admit: 2021-01-11 | Discharge: 2021-01-11 | Disposition: A | Discharge status: Home / Self Care | Payer: Medicare HMO | Source: Ambulatory Visit | Attending: Pulmonary Disease | Admitting: Pulmonary Disease

## 2021-01-11 DIAGNOSIS — U071 COVID-19: Secondary | ICD-10-CM | POA: Diagnosis not present

## 2021-01-11 DIAGNOSIS — I10 Essential (primary) hypertension: Secondary | ICD-10-CM | POA: Diagnosis not present

## 2021-01-11 DIAGNOSIS — R54 Age-related physical debility: Secondary | ICD-10-CM | POA: Diagnosis not present

## 2021-01-11 MED ORDER — METHYLPREDNISOLONE SODIUM SUCC 125 MG IJ SOLR: 125.0000 mg | Freq: Once | INTRAMUSCULAR | Status: DC | PRN

## 2021-01-11 MED ORDER — EPINEPHRINE 0.3 MG/0.3ML IJ SOAJ: 0.3000 mg | Freq: Once | INTRAMUSCULAR | Status: DC | PRN

## 2021-01-11 MED ORDER — FAMOTIDINE IN NACL 20-0.9 MG/50ML-% IV SOLN: 20.0000 mg | Freq: Once | INTRAVENOUS | Status: DC | PRN

## 2021-01-11 MED ORDER — SODIUM CHLORIDE 0.9 % IV SOLN: INTRAVENOUS | Status: DC | PRN

## 2021-01-11 MED ORDER — ALBUTEROL SULFATE HFA 108 (90 BASE) MCG/ACT IN AERS: 2.0000 | INHALATION_SPRAY | Freq: Once | RESPIRATORY_TRACT | Status: DC | PRN

## 2021-01-11 MED ORDER — DIPHENHYDRAMINE HCL 50 MG/ML IJ SOLN: 50.0000 mg | Freq: Once | INTRAMUSCULAR | Status: DC | PRN

## 2021-01-11 MED ORDER — SOTROVIMAB 500 MG/8ML IV SOLN
500.0000 mg | Freq: Once | INTRAVENOUS | Status: AC
  Administered 2021-01-11: 500 mg via INTRAVENOUS

## 2021-01-11 NOTE — Progress Notes (Signed)
Diagnosis: COVID-19  Physician: Dr. Patrick Wright  Procedure: [facility] Infusion Clinic Med: Sotrovimab infusion - Provided patient with sotrovimab fact sheet for patients, parents, and caregivers prior to infusion.   Complications: No immediate complications noted  Discharge: Discharged home    

## 2021-01-11 NOTE — Progress Notes (Incomplete)
Diagnosis: COVID-19  Physician: Dr. Patrick Wright  Procedure: [facility] Infusion Clinic Med: Sotrovimab infusion - Provided patient with sotrovimab fact sheet for patients, parents, and caregivers prior to infusion.   Complications: No immediate complications noted  Discharge: Discharged home    

## 2021-01-11 NOTE — Discharge Instructions (Signed)

## 2021-01-11 NOTE — Progress Notes (Signed)
Patient reviewed Fact Sheet for Patients, Parents, and Caregivers for Emergency Use Authorization (EUA) of sotrovimab for the Treatment of Coronavirus. Patient also reviewed and is agreeable to the estimated cost of treatment. Patient is agreeable to proceed.   

## 2021-04-21 ENCOUNTER — Ambulatory Visit: Payer: Medicare HMO | Admitting: Family Medicine

## 2021-04-26 ENCOUNTER — Other Ambulatory Visit: Payer: Self-pay

## 2021-04-26 DIAGNOSIS — I10 Essential (primary) hypertension: Secondary | ICD-10-CM

## 2021-04-26 DIAGNOSIS — K219 Gastro-esophageal reflux disease without esophagitis: Secondary | ICD-10-CM

## 2021-04-26 MED ORDER — FAMOTIDINE 40 MG PO TABS: ORAL_TABLET | ORAL | 0 refills | Status: DC

## 2021-04-26 MED ORDER — ENALAPRIL MALEATE 10 MG PO TABS: 10.0000 mg | ORAL_TABLET | Freq: Every day | ORAL | 0 refills | Status: DC

## 2021-04-26 NOTE — Telephone Encounter (Signed)
[name] needs refill on [facility] (VAS[facility]EC) 10 MG tablet [facility] [place] [place] also she needs famotidine (PEPCID) 40 MG tablet pt is needing RX asap if not can she get a small script till mail order is received. She is set up on [facility] and [facility] not tell [facility] she needed new RXs   Pt call back [phone]

## 2021-05-05 ENCOUNTER — Ambulatory Visit (INDEPENDENT_AMBULATORY_CARE_PROVIDER_SITE_OTHER): Payer: Medicare HMO | Admitting: Family Medicine

## 2021-05-05 ENCOUNTER — Other Ambulatory Visit: Payer: Self-pay

## 2021-05-05 VITALS — BP 136/84 | HR 62 | Temp 97.3°F | Ht 67.0 in | Wt 179.0 lb

## 2021-05-05 DIAGNOSIS — I10 Essential (primary) hypertension: Secondary | ICD-10-CM

## 2021-05-05 DIAGNOSIS — E538 Deficiency of other specified B group vitamins: Secondary | ICD-10-CM | POA: Diagnosis not present

## 2021-05-05 DIAGNOSIS — Z9884 Bariatric surgery status: Secondary | ICD-10-CM | POA: Diagnosis not present

## 2021-05-05 DIAGNOSIS — D1801 Hemangioma of skin and subcutaneous tissue: Secondary | ICD-10-CM

## 2021-05-05 DIAGNOSIS — E785 Hyperlipidemia, unspecified: Secondary | ICD-10-CM

## 2021-05-05 DIAGNOSIS — E559 Vitamin D deficiency, unspecified: Secondary | ICD-10-CM | POA: Diagnosis not present

## 2021-05-05 MED ORDER — ENALAPRIL MALEATE 10 MG PO TABS: 10.0000 mg | ORAL_TABLET | Freq: Every day | ORAL | 1 refills | Status: DC

## 2021-05-05 MED ORDER — ENALAPRIL MALEATE 10 MG PO TABS: 10.0000 mg | ORAL_TABLET | Freq: Every day | ORAL | 0 refills | Status: DC

## 2021-05-05 MED ORDER — FAMOTIDINE 40 MG PO TABS: ORAL_TABLET | ORAL | 1 refills | Status: DC

## 2021-05-05 NOTE — Progress Notes (Signed)
Patient ID: [name], female    DOB: 11-29-1946, 75 y.o.   MRN: [id number]   Chief Complaint  Patient presents with  . Hypertension    [facility]ication [facility]  . spot on lip    Vein swelling on lip for 3 to 4 yrs   Subjective:    HPI  HTN Pt compliant with BP meds.  No SEs Denies chest pain, sob, LE swelling, or blurry vision.  Small lump on the lip, looking like a hemangioma on the lip.  Been there for 3-4 yrs on the rt lower lip.  No bleeding.  Slightly tender and felling another small bump with it. No ulceration.  gerd- doing well.  Cont with pepcid  Medical History [name] has a past medical history of Arthritis, Cancer ([place]), COVID-19 virus infection (12/2020), GERD (gastroesophageal reflux disease), History of kidney stones, Hypercholesteremia, Hypertension, and Hypothyroidism.   Outpatient Encounter Medications as of 05/05/2021  Medication Sig  . calcium citrate (CALCITRATE - DOSED IN MG ELEMENTAL CALCIUM) 950 MG tablet Take 1 tablet by mouth 2 (two) times daily.   . Cholecalciferol (VITAMIN D) 50 MCG (2000 UT) CAPS Take 2,000 Units by mouth 3 (three) times daily.  . enalapril (VASOTEC) 10 MG tablet Take 1 tablet (10 mg total) by mouth at bedtime.  . famotidine (PEPCID) 40 MG tablet TAKE 1 TABLET DAILY (CANCELRANTITIDINE)  . ferrous sulfate 325 (65 FE) MG tablet Take 325 mg by mouth daily with breakfast.  . folic acid (FOLVITE) 976 MCG tablet Take 800 mcg by mouth daily.   Marland Kitchen levothyroxine (SYNTHROID) 150 MCG tablet Take 1 tablet (150 mcg total) by mouth daily before breakfast.  . MAGNESIUM-ZINC PO Take 2 tablets by mouth daily.  . Multiple Vitamins-Minerals (MULTIVITAMINS THER. W/MINERALS) TABS Take 1 tablet by mouth 2 (two) times daily.    . valACYclovir (VALTREX) 1000 MG tablet TAKE 2 TABLETS AT FIRST SIGN OF FEVER BLISTER, THEN 2 TABLETS 2 HOURSLATER (Patient taking differently: Take 1,000 mg by mouth 2 (two) times daily as needed  (fever blister). )  . vitamin B-12 (CYANOCOBALAMIN) 500 MCG tablet Take 500 mcg by mouth daily.   . [DISCONTINUED] enalapril (VASOTEC) 10 MG tablet Take 1 tablet (10 mg total) by mouth at bedtime.  . [DISCONTINUED] enalapril (VASOTEC) 10 MG tablet Take 1 tablet (10 mg total) by mouth at bedtime.  . [DISCONTINUED] famotidine (PEPCID) 40 MG tablet TAKE 1 TABLET DAILY (CANCELRANTITIDINE)   No facility-administered encounter medications on file as of 05/05/2021.     Review of Systems  Constitutional: Negative for chills and fever.  HENT: Negative for congestion, rhinorrhea and sore throat.   Respiratory: Negative for cough, shortness of breath and wheezing.   Cardiovascular: Negative for chest pain and leg swelling.  Gastrointestinal: Negative for abdominal pain, diarrhea, nausea and vomiting.  Genitourinary: Negative for dysuria and frequency.  Musculoskeletal: Negative for arthralgias and back pain.  Skin: Negative for rash.  Neurological: Negative for dizziness, weakness and headaches.     Vitals BP 136/84   Pulse 62   Temp (!) 97.3 F (36.3 C)   Ht 5' 7"  (1.702 m)   Wt 179 lb (81.2 kg)   SpO2 100%   BMI 28.04 kg/m   Objective:   Physical Exam Vitals and nursing note reviewed.  Constitutional:      Appearance: Normal appearance.  HENT:     Head: Normocephalic and atraumatic.     Nose: Nose normal.  Mouth/Throat:     Mouth: Mucous membranes are moist.     Pharynx: Oropharynx is clear.     Comments: +red small 0.5cm round mass on lower lip on right.  No bleeding or tenderness. Eyes:     Extraocular Movements: Extraocular movements intact.     Conjunctiva/sclera: Conjunctivae normal.     Pupils: Pupils are equal, round, and reactive to light.  Cardiovascular:     Rate and Rhythm: Normal rate and regular rhythm.     Pulses: Normal pulses.     Heart sounds: Normal heart sounds.  Pulmonary:     Effort: Pulmonary effort is normal.     Breath sounds: Normal breath  sounds. No wheezing, rhonchi or rales.  Musculoskeletal:        General: Normal range of motion.     Right lower leg: No edema.     Left lower leg: No edema.  Skin:    General: Skin is warm and dry.     Findings: No lesion or rash.  Neurological:     General: No focal deficit present.     Mental Status: She is alert and oriented to person, place, and time.  Psychiatric:        Mood and Affect: Mood normal.        Behavior: Behavior normal.      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 History of gastric bypass - CBC - Vitamin D (25 hydroxy) - B12  3. Primary hypertension - CBC - CMP14+EGFR  4. Hyperlipidemia LDL goal <130 - CBC - Lipid panel  5. Vitamin D deficiency - Vitamin D (25 hydroxy)  6. Vitamin B12 deficiency - B12   htn- stable. Cont meds.  hld- stable will recheck labs.   Vit d defic-will recheck las. Vit b12 def- will recheck labs.  Lip mass- appears to be a hemangioma, has been there for years.  Noticing small bump next to it and pt wanting it to have further eval from derm.  Referral given.  Return in about 6 months (around 11/05/2021) for f/u htn, hld, gerd.   05/13/2021

## 2021-05-12 DIAGNOSIS — Z85850 Personal history of malignant neoplasm of thyroid: Secondary | ICD-10-CM | POA: Diagnosis not present

## 2021-05-12 DIAGNOSIS — E890 Postprocedural hypothyroidism: Secondary | ICD-10-CM | POA: Diagnosis not present

## 2021-05-19 ENCOUNTER — Telehealth: Payer: Self-pay | Admitting: Family Medicine

## 2021-05-19 NOTE — Telephone Encounter (Signed)
[place]. Patient will call back and schedule Medicare Annual Wellness Visit (AWV).   Please offer to do virtually or by telephone.   Due for AWVI  Please schedule at anytime with Nurse Health Advisor.

## 2021-06-01 DIAGNOSIS — E785 Hyperlipidemia, unspecified: Secondary | ICD-10-CM | POA: Diagnosis not present

## 2021-06-01 DIAGNOSIS — E538 Deficiency of other specified B group vitamins: Secondary | ICD-10-CM | POA: Diagnosis not present

## 2021-06-01 DIAGNOSIS — Z9884 Bariatric surgery status: Secondary | ICD-10-CM | POA: Diagnosis not present

## 2021-06-01 DIAGNOSIS — I10 Essential (primary) hypertension: Secondary | ICD-10-CM | POA: Diagnosis not present

## 2021-06-01 DIAGNOSIS — E559 Vitamin D deficiency, unspecified: Secondary | ICD-10-CM | POA: Diagnosis not present

## 2021-06-02 LAB — LIPID PANEL
Chol/HDL Ratio: 3 ratio (ref 0.0–4.4)
Cholesterol, Total: 175 mg/dL (ref 100–199)
HDL: 59 mg/dL (ref >39)
LDL Chol Calc (NIH): 100 mg/dL — ABNORMAL HIGH (ref 0–99)
Triglycerides: 86 mg/dL (ref 0–149)
VLDL Cholesterol Cal: 16 mg/dL (ref 5–40)

## 2021-06-02 LAB — CMP14+EGFR
ALT: 13 IU/L (ref 0–32)
AST: 14 IU/L (ref 0–40)
Albumin/Globulin Ratio: 2.2 (ref 1.2–2.2)
Albumin: 4.1 g/dL (ref 3.7–4.7)
Alkaline Phosphatase: 74 IU/L (ref 44–121)
BUN/Creatinine Ratio: 20 (ref 12–28)
BUN: 17 mg/dL (ref 8–27)
Bilirubin Total: 0.6 mg/dL (ref 0.0–1.2)
CO2: 19 mmol/L — ABNORMAL LOW (ref 20–29)
Calcium: 8.8 mg/dL (ref 8.7–10.3)
Chloride: 110 mmol/L — ABNORMAL HIGH (ref 96–106)
Creatinine, Ser: 0.83 mg/dL (ref 0.57–1.00)
Globulin, Total: 1.9 g/dL (ref 1.5–4.5)
Glucose: 79 mg/dL (ref 65–99)
Potassium: 4.2 mmol/L (ref 3.5–5.2)
Sodium: 143 mmol/L (ref 134–144)
Total Protein: 6 g/dL (ref 6.0–8.5)
eGFR: 73 mL/min/{1.73_m2} (ref >59)

## 2021-06-02 LAB — CBC
Hematocrit: 38.2 % (ref 34.0–46.6)
Hemoglobin: 12.6 g/dL (ref 11.1–15.9)
MCH: 30.7 pg (ref 26.6–33.0)
MCHC: 33 g/dL (ref 31.5–35.7)
MCV: 93 fL (ref 79–97)
Platelets: 215 10*3/uL (ref 150–450)
RBC: 4.11 x10E6/uL (ref 3.77–5.28)
RDW: 13.9 % (ref 11.7–15.4)
WBC: 5.6 10*3/uL (ref 3.4–10.8)

## 2021-06-02 LAB — VITAMIN B12: Vitamin B-12: 183 pg/mL — ABNORMAL LOW (ref 232–1245)

## 2021-06-02 LAB — VITAMIN D 25 HYDROXY (VIT D DEFICIENCY, FRACTURES): Vit D, 25-Hydroxy: 31.8 ng/mL (ref 30.0–100.0)

## 2021-06-03 DIAGNOSIS — R69 Illness, unspecified: Secondary | ICD-10-CM | POA: Diagnosis not present

## 2021-06-03 DIAGNOSIS — R32 Unspecified urinary incontinence: Secondary | ICD-10-CM | POA: Diagnosis not present

## 2021-06-03 DIAGNOSIS — G8929 Other chronic pain: Secondary | ICD-10-CM | POA: Diagnosis not present

## 2021-06-03 DIAGNOSIS — I10 Essential (primary) hypertension: Secondary | ICD-10-CM | POA: Diagnosis not present

## 2021-06-03 DIAGNOSIS — B009 Herpesviral infection, unspecified: Secondary | ICD-10-CM | POA: Diagnosis not present

## 2021-06-03 DIAGNOSIS — M1990 Unspecified osteoarthritis, unspecified site: Secondary | ICD-10-CM | POA: Diagnosis not present

## 2021-06-03 DIAGNOSIS — Z008 Encounter for other general examination: Secondary | ICD-10-CM | POA: Diagnosis not present

## 2021-06-03 DIAGNOSIS — G629 Polyneuropathy, unspecified: Secondary | ICD-10-CM | POA: Diagnosis not present

## 2021-06-03 DIAGNOSIS — K219 Gastro-esophageal reflux disease without esophagitis: Secondary | ICD-10-CM | POA: Diagnosis not present

## 2021-06-03 DIAGNOSIS — Z803 Family history of malignant neoplasm of breast: Secondary | ICD-10-CM | POA: Diagnosis not present

## 2021-06-03 DIAGNOSIS — E890 Postprocedural hypothyroidism: Secondary | ICD-10-CM | POA: Diagnosis not present

## 2021-06-15 DIAGNOSIS — I781 Nevus, non-neoplastic: Secondary | ICD-10-CM | POA: Diagnosis not present

## 2021-06-15 DIAGNOSIS — X32XXXA Exposure to sunlight, initial encounter: Secondary | ICD-10-CM | POA: Diagnosis not present

## 2021-06-15 DIAGNOSIS — R69 Illness, unspecified: Secondary | ICD-10-CM | POA: Diagnosis not present

## 2021-06-15 DIAGNOSIS — L570 Actinic keratosis: Secondary | ICD-10-CM | POA: Diagnosis not present

## 2021-06-23 IMAGING — RF DG C-ARM 1-60 MIN
1 series · 8 of 8 positions shown · non-contrast
Comparison: 08/30/2019

CLINICAL DATA: Right total hip arthroplasty

EXAM:
OPERATIVE RIGHT HIP (WITH PELVIS IF PERFORMED) AP VIEWS
TECHNIQUE: Fluoroscopic spot image(s) were submitted for interpretation
post-operatively.

[Series 1: unknown protocol · 0.20mm/px · 8 of 8 slices shown]
[im 1/8]
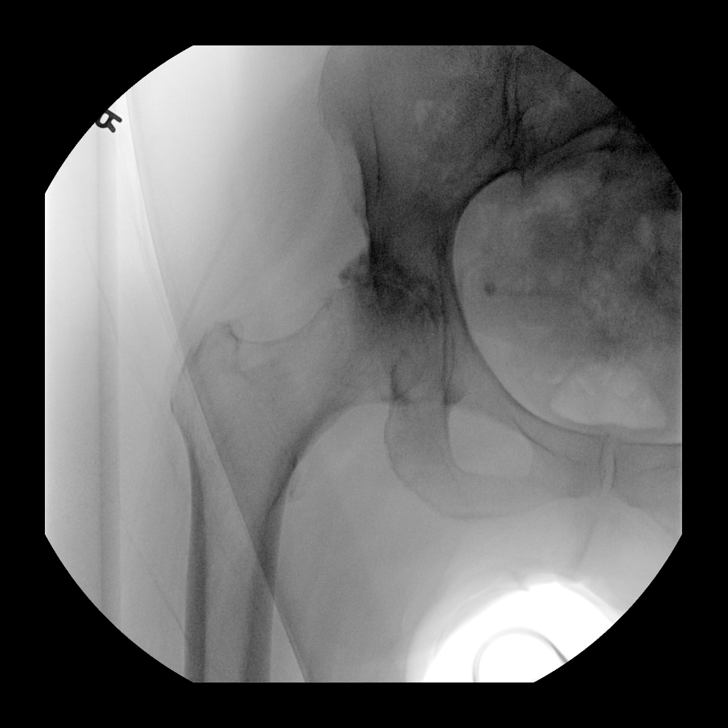
[im 2/8]
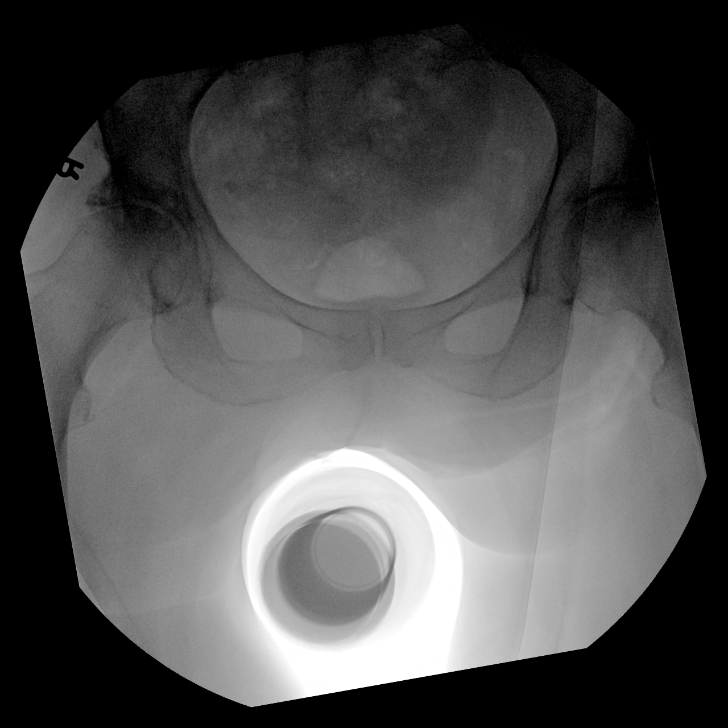
[im 3/8]
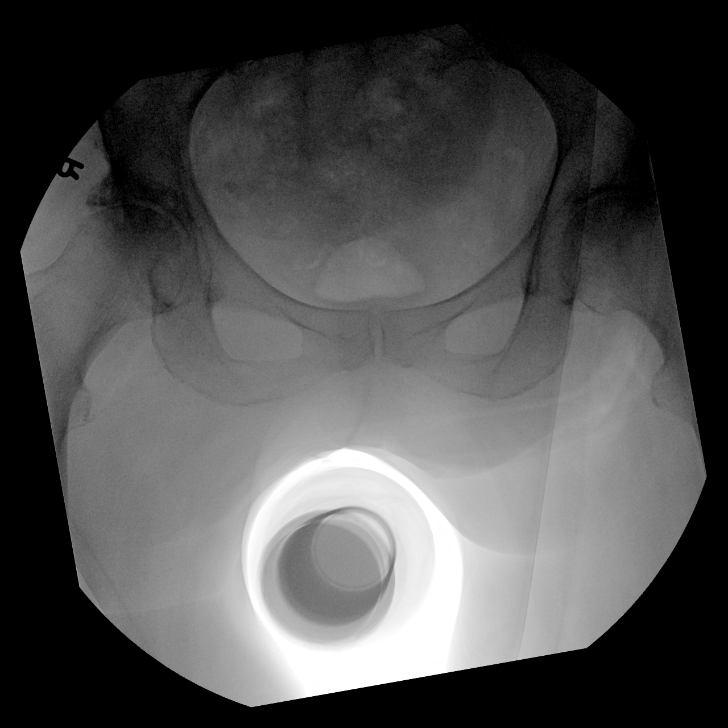
[im 4/8]
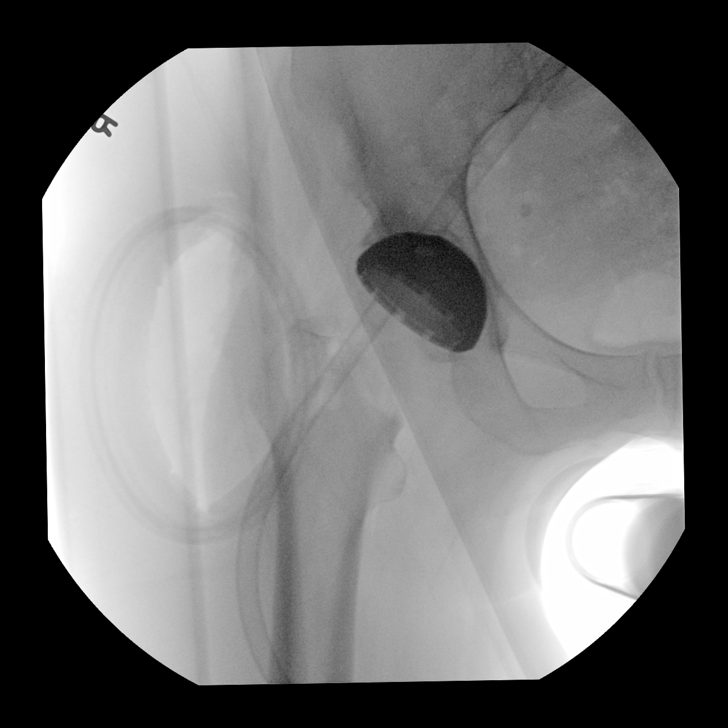
[im 5/8]
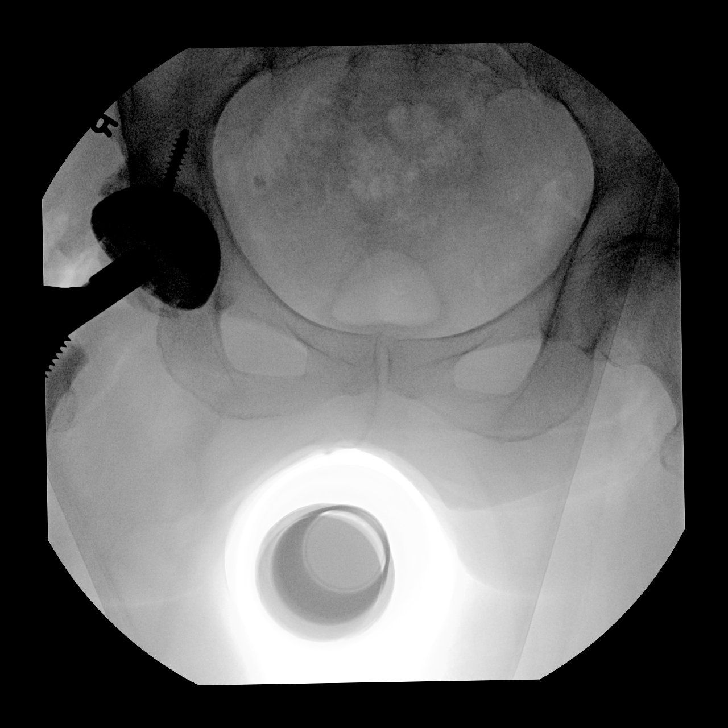
[im 6/8]
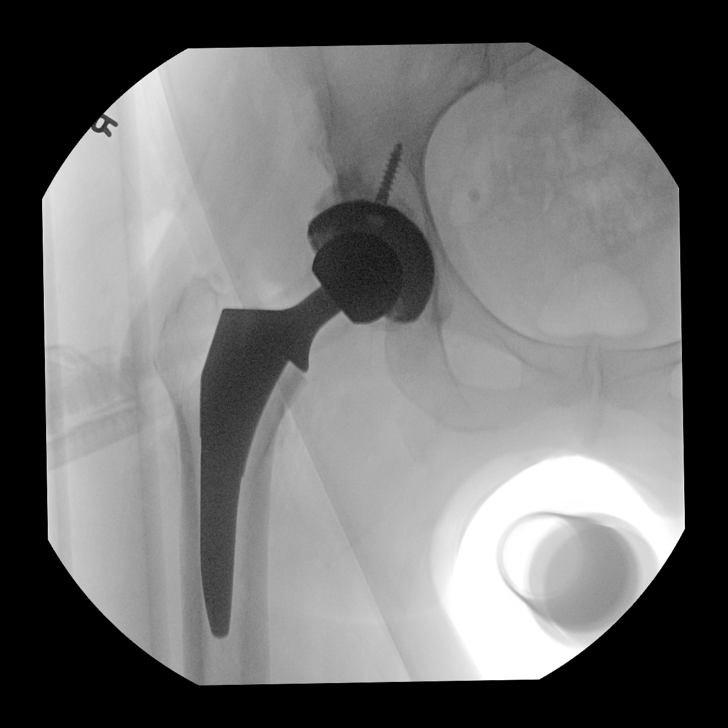
[im 7/8]
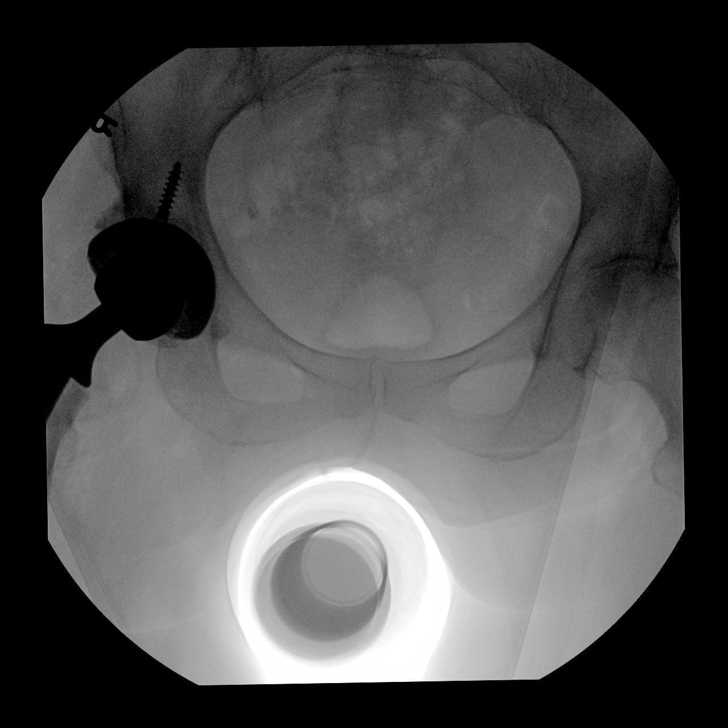
[im 8/8]
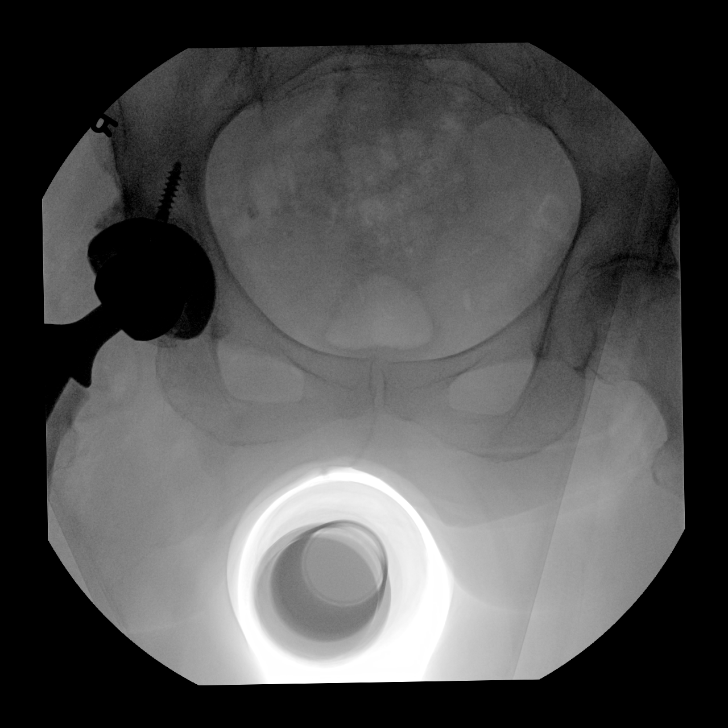

[8 of 8 positions shown; findings below may reference images not displayed]

FINDINGS: 6 C-arm fluoroscopic images were obtained intraoperatively and
submitted for post operative interpretation. Subsequent images
demonstrate placement of right total hip arthroplasty hardware.
Arthroplasty components appear in their expected alignment without
obvious intraoperative complication. 28 seconds of fluoroscopy time
was utilized. Please see the performing provider's procedural report
for further detail.
IMPRESSION: As above.

## 2021-06-23 IMAGING — RF DG HIP (WITH PELVIS) OPERATIVE*R*
1 series · 8 of 8 positions shown · non-contrast
Comparison: 08/30/2019

CLINICAL DATA: Right total hip arthroplasty

EXAM:
OPERATIVE RIGHT HIP (WITH PELVIS IF PERFORMED) AP VIEWS
TECHNIQUE: Fluoroscopic spot image(s) were submitted for interpretation
post-operatively.

[Series 1: unknown protocol · 0.20mm/px · 8 of 8 slices shown]
[im 1/8]
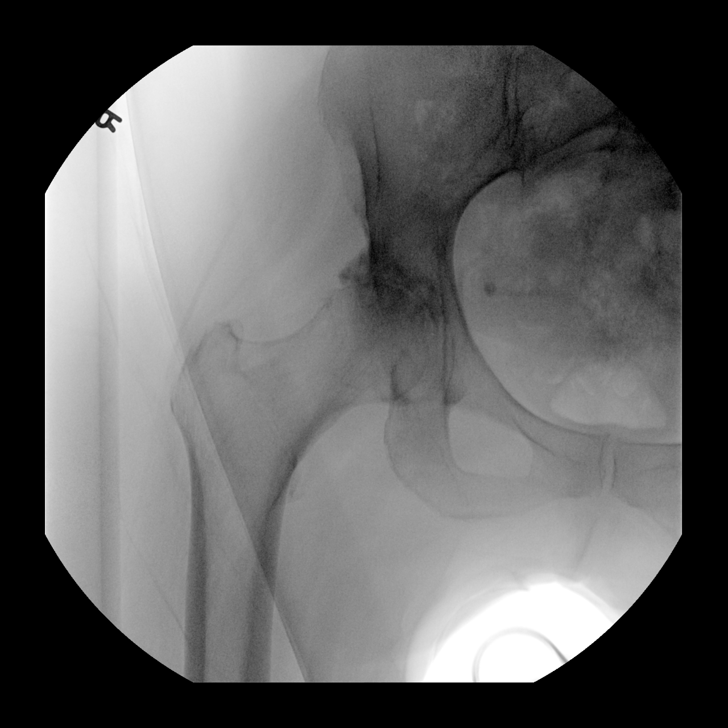
[im 2/8]
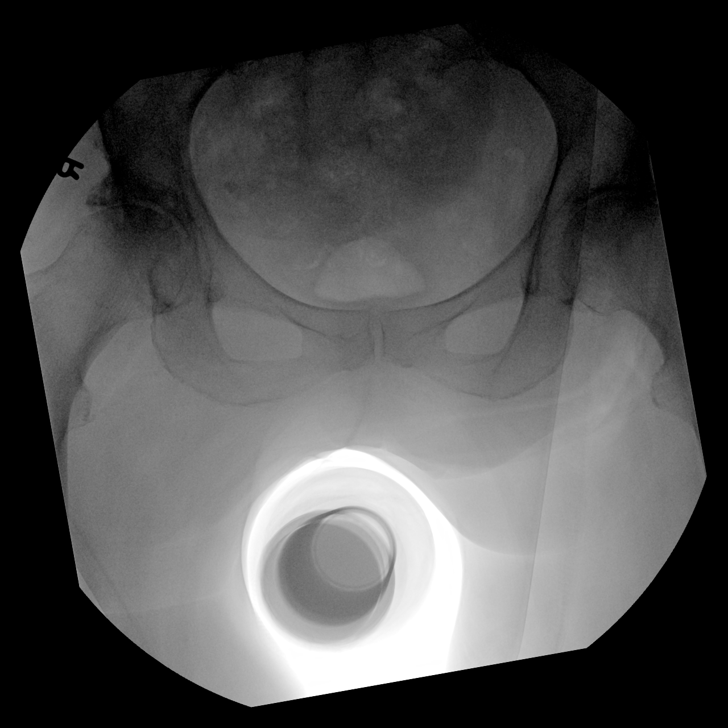
[im 3/8]
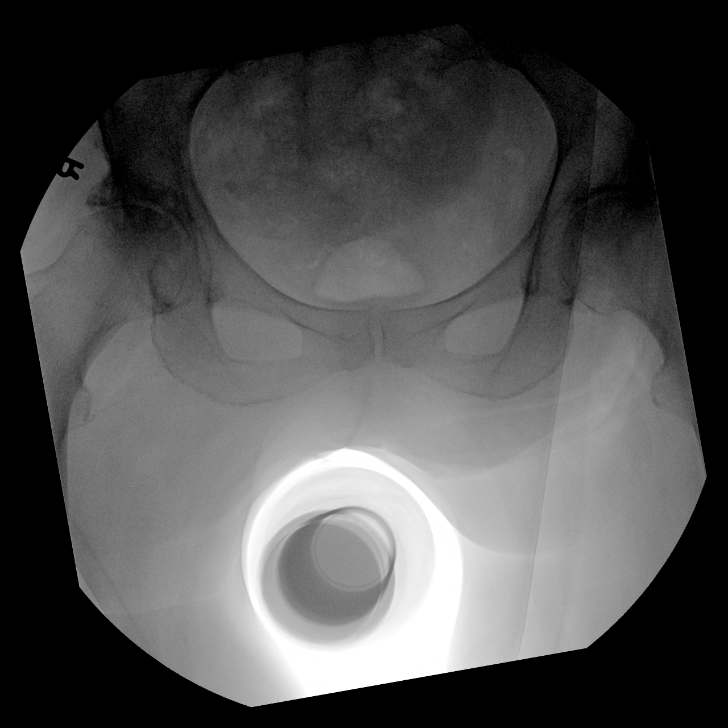
[im 4/8]
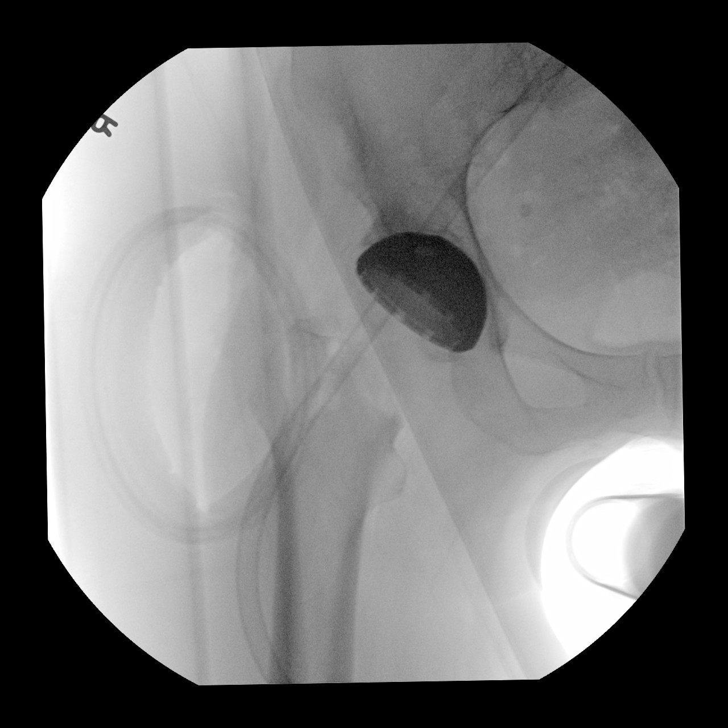
[im 5/8]
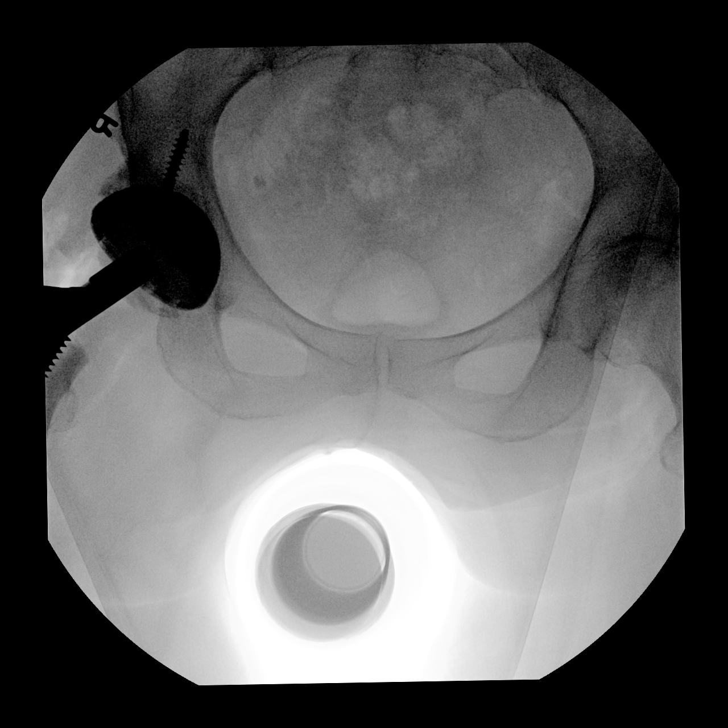
[im 6/8]
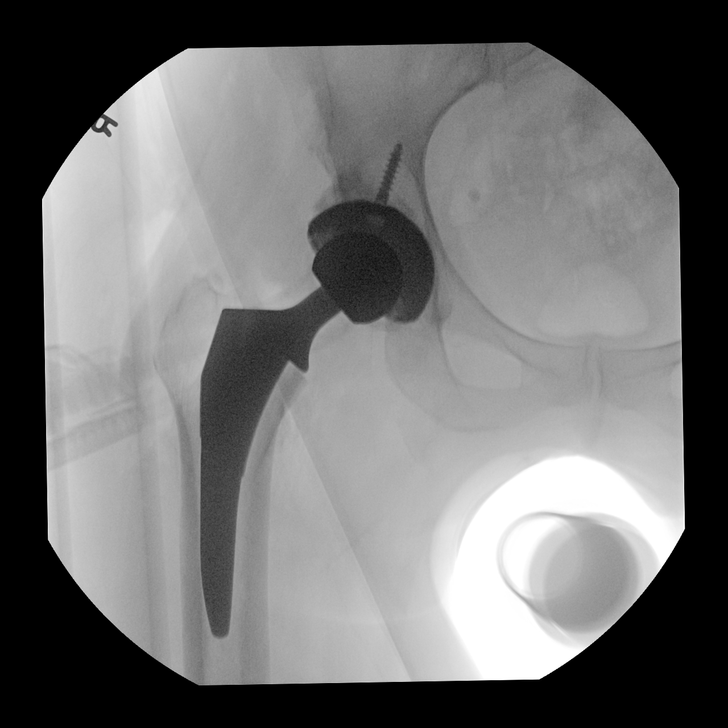
[im 7/8]
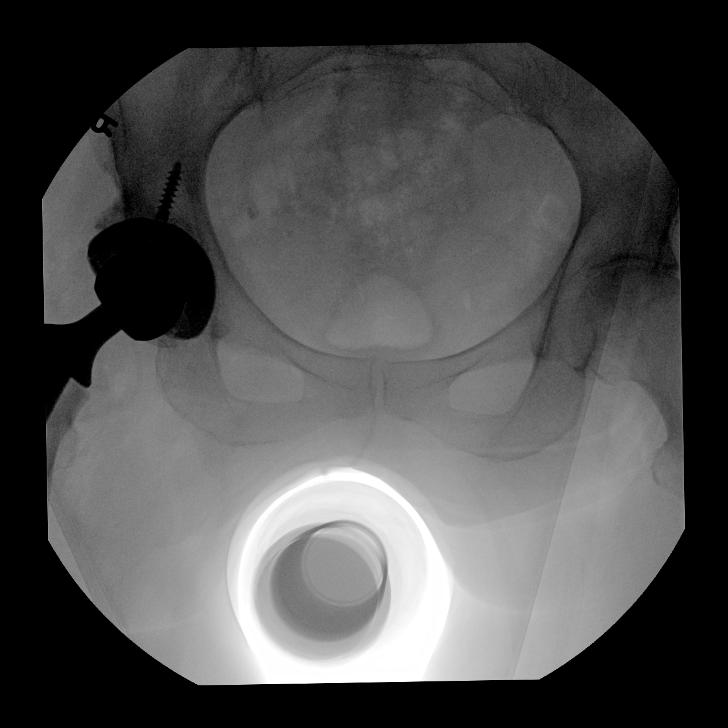
[im 8/8]
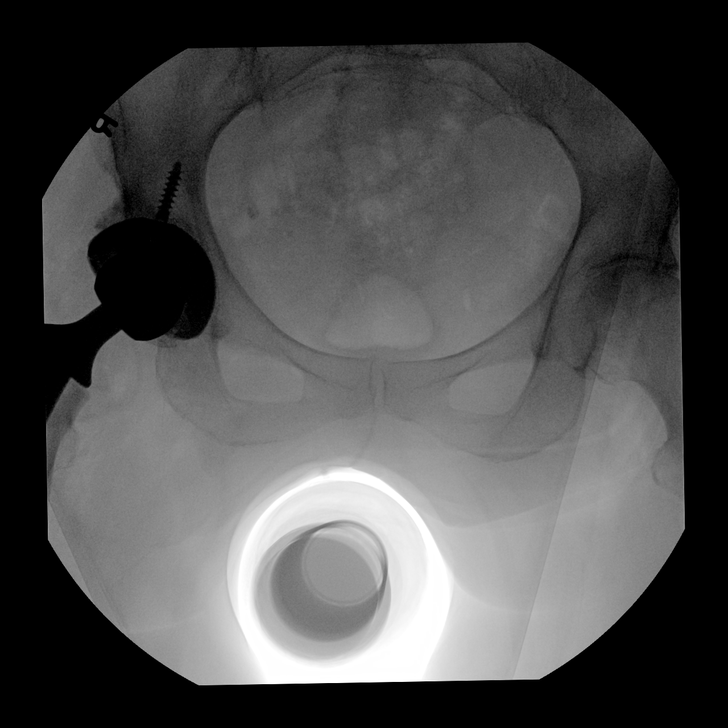

[8 of 8 positions shown; findings below may reference images not displayed]

FINDINGS: 6 C-arm fluoroscopic images were obtained intraoperatively and
submitted for post operative interpretation. Subsequent images
demonstrate placement of right total hip arthroplasty hardware.
Arthroplasty components appear in their expected alignment without
obvious intraoperative complication. 28 seconds of fluoroscopy time
was utilized. Please see the performing provider's procedural report
for further detail.
IMPRESSION: As above.

## 2021-07-18 ENCOUNTER — Other Ambulatory Visit: Payer: Self-pay | Admitting: Family Medicine

## 2021-08-23 ENCOUNTER — Other Ambulatory Visit: Payer: Self-pay | Admitting: Family Medicine

## 2021-09-01 DIAGNOSIS — Z0100 Encounter for examination of eyes and vision without abnormal findings: Secondary | ICD-10-CM | POA: Diagnosis not present

## 2021-10-12 ENCOUNTER — Other Ambulatory Visit: Payer: Medicare HMO

## 2021-10-26 ENCOUNTER — Other Ambulatory Visit (INDEPENDENT_AMBULATORY_CARE_PROVIDER_SITE_OTHER): Payer: Medicare HMO

## 2021-10-26 ENCOUNTER — Other Ambulatory Visit: Payer: Self-pay

## 2021-10-26 DIAGNOSIS — Z23 Encounter for immunization: Secondary | ICD-10-CM | POA: Diagnosis not present

## 2021-11-01 DIAGNOSIS — H2511 Age-related nuclear cataract, right eye: Secondary | ICD-10-CM | POA: Diagnosis not present

## 2021-11-01 DIAGNOSIS — H2512 Age-related nuclear cataract, left eye: Secondary | ICD-10-CM | POA: Diagnosis not present

## 2021-11-01 DIAGNOSIS — H18523 Epithelial (juvenile) corneal dystrophy, bilateral: Secondary | ICD-10-CM | POA: Diagnosis not present

## 2021-11-10 ENCOUNTER — Other Ambulatory Visit: Payer: Self-pay

## 2021-11-10 ENCOUNTER — Ambulatory Visit (INDEPENDENT_AMBULATORY_CARE_PROVIDER_SITE_OTHER): Payer: Medicare HMO | Admitting: Family Medicine

## 2021-11-10 VITALS — BP 128/79 | HR 70 | Temp 97.3°F | Ht 67.0 in | Wt 179.0 lb

## 2021-11-10 DIAGNOSIS — Z13820 Encounter for screening for osteoporosis: Secondary | ICD-10-CM

## 2021-11-10 DIAGNOSIS — Z862 Personal history of diseases of the blood and blood-forming organs and certain disorders involving the immune mechanism: Secondary | ICD-10-CM

## 2021-11-10 DIAGNOSIS — I10 Essential (primary) hypertension: Secondary | ICD-10-CM

## 2021-11-10 DIAGNOSIS — E538 Deficiency of other specified B group vitamins: Secondary | ICD-10-CM | POA: Diagnosis not present

## 2021-11-10 DIAGNOSIS — E559 Vitamin D deficiency, unspecified: Secondary | ICD-10-CM

## 2021-11-10 DIAGNOSIS — E890 Postprocedural hypothyroidism: Secondary | ICD-10-CM

## 2021-11-10 DIAGNOSIS — E785 Hyperlipidemia, unspecified: Secondary | ICD-10-CM

## 2021-11-10 NOTE — Patient Instructions (Signed)
Labs today.  I will order the bone density.  Continue your current medications.  Follow up in 6 months.  Take care  Dr. Lacinda Axon

## 2021-11-10 NOTE — Progress Notes (Signed)
Subjective:  Patient ID: [name], female    DOB: 04-27-1946  Age: 75 y.o. MRN: [id number]  CC: Chief Complaint  Patient presents with   Hypertension    HPI:  75 year old female with postsurgical hypothyroidism, GERD, hypertension, history of gastric bypass, hyperlipidemia presents for follow-up and to establish care with me.  Patient's hypertension is at goal on enalapril.  She is doing well on this medication.  No side effects.  Patient's postsurgical hypothyroidism is managed by endocrinology.  She is currently on Synthroid 150 MCG daily.  She has an upcoming appointment with endocrinology.  Regarding her B12 deficiency, last B12 was 183.  She is taking oral B12.  I am not sure that she absorbs well due to being status post gastric bypass.  We will discuss this today.  Patient has known hyperlipidemia.  Last LDL was 100.  She has been able to control this with diet and lifestyle.  Needs labs.  Patient Active Problem List   Diagnosis Date Noted   B12 deficiency 11/10/2021   Vitamin D deficiency 11/10/2021   Status post total hip replacement, right 12/22/2020   History of gastric bypass 11/02/2020   History of thyroid cancer 10/22/2020   Hypothyroidism 05/22/2016   Essential hypertension, benign 09/09/2013   Hyperlipidemia 09/09/2013   Esophageal reflux 09/09/2013    Social Hx   Social History   Socioeconomic History   Marital status: Widowed    Spouse name: Not on file   Number of children: Not on file   Years of education: Not on file   Highest education level: Not on file  Occupational History   Occupation: Retired Therapist, art  Tobacco Use   Smoking status: Never   Smokeless tobacco: Never  Vaping Use   Vaping Use: Never used  Substance and Sexual Activity   Alcohol use: No   Drug use: No   Sexual activity: Not on file  Other Topics Concern   Not on file  Social History Narrative   Not on file   Social Determinants of Health   Financial  Resource Strain: Not on file  Food Insecurity: Not on file  Transportation Needs: Not on file  Physical Activity: Not on file  Stress: Not on file  Social Connections: Not on file    Review of Systems  Constitutional: Negative.   Respiratory: Negative.    Cardiovascular: Negative.     Objective:  BP 128/79   Pulse 70   Temp (!) 97.3 F (36.3 C)   Ht 5' 7"  (1.702 m)   Wt 179 lb (81.2 kg)   SpO2 100%   BMI 28.04 kg/m   BP/Weight 11/10/2021 05/05/2021 3/81/8299  Systolic BP 371 696 789  Diastolic BP 79 84 72  Wt. (Lbs) 179 179 -  BMI 28.04 28.04 -    Physical Exam Vitals and nursing note reviewed.  Constitutional:      General: She is not in acute distress.    Appearance: Normal appearance. She is not ill-appearing.  HENT:     Head: Normocephalic and atraumatic.  Eyes:     General:        Right eye: No discharge.        Left eye: No discharge.     Conjunctiva/sclera: Conjunctivae normal.  Cardiovascular:     Rate and Rhythm: Normal rate and regular rhythm.     Heart sounds: Murmur heard.  Pulmonary:     Effort: Pulmonary effort is normal.     Breath  sounds: Normal breath sounds. No wheezing, rhonchi or rales.  Neurological:     Mental Status: She is alert.  Psychiatric:        Mood and Affect: Mood normal.        Behavior: Behavior normal.    Lab Results  Component Value Date   WBC 5.6 06/01/2021   HGB 12.6 06/01/2021   HCT 38.2 06/01/2021   PLT 215 06/01/2021   GLUCOSE 79 06/01/2021   CHOL 175 06/01/2021   TRIG 86 06/01/2021   HDL 59 06/01/2021   LDLCALC 100 (H) 06/01/2021   ALT 13 06/01/2021   AST 14 06/01/2021   NA 143 06/01/2021   K 4.2 06/01/2021   CL 110 (H) 06/01/2021   CREATININE 0.83 06/01/2021   BUN 17 06/01/2021   CO2 19 (L) 06/01/2021   TSH 0.979 06/30/2017   INR 1.0 12/19/2019     Assessment & Plan:   Problem List Items Addressed This Visit       Cardiovascular and Mediastinum   Essential hypertension, benign - Primary     At goal.  We will continue enalapril.      Relevant Orders   CMP14+EGFR     Endocrine   Hypothyroidism    Managed by Endo.        Other   Hyperlipidemia    Has been stable.  I do not think that the patient needs lipid-lowering therapy at this time.  Labs ordered.      Relevant Orders   Lipid panel   B12 deficiency    Labs ordered.  Will likely need IM B12.      Relevant Orders   Vitamin B12   Vitamin D deficiency   Relevant Orders   Vitamin D (25 hydroxy)   Other Visit Diagnoses     History of anemia       Relevant Orders   CBC   Screening for osteoporosis       Relevant Orders   DG Bone Density      Follow-up:  Return in about 6 months (around 05/10/2022) for Follow up Chronic medical issues.  [facility]

## 2021-11-10 NOTE — Assessment & Plan Note (Signed)
Has been stable.  I do not think that the patient needs lipid-lowering therapy at this time.  Labs ordered.

## 2021-11-10 NOTE — Assessment & Plan Note (Signed)
Managed by Endo 

## 2021-11-10 NOTE — Assessment & Plan Note (Signed)
At goal.  We will continue enalapril.

## 2021-11-10 NOTE — Assessment & Plan Note (Signed)
Labs ordered.  Will likely need IM B12.

## 2021-11-12 DIAGNOSIS — E890 Postprocedural hypothyroidism: Secondary | ICD-10-CM | POA: Diagnosis not present

## 2021-11-12 DIAGNOSIS — Z85850 Personal history of malignant neoplasm of thyroid: Secondary | ICD-10-CM | POA: Diagnosis not present

## 2021-11-16 ENCOUNTER — Other Ambulatory Visit: Payer: Self-pay | Admitting: Family Medicine

## 2021-11-16 DIAGNOSIS — Z1231 Encounter for screening mammogram for malignant neoplasm of breast: Secondary | ICD-10-CM

## 2021-12-13 ENCOUNTER — Other Ambulatory Visit: Payer: Self-pay | Admitting: Family Medicine

## 2021-12-22 ENCOUNTER — Other Ambulatory Visit: Payer: Self-pay

## 2021-12-22 ENCOUNTER — Ambulatory Visit (INDEPENDENT_AMBULATORY_CARE_PROVIDER_SITE_OTHER): Payer: Medicare HMO

## 2021-12-22 ENCOUNTER — Encounter: Payer: Self-pay | Admitting: Orthopaedic Surgery

## 2021-12-22 ENCOUNTER — Ambulatory Visit: Payer: Medicare HMO | Admitting: Physician Assistant

## 2021-12-22 DIAGNOSIS — Z96641 Presence of right artificial hip joint: Secondary | ICD-10-CM

## 2021-12-22 NOTE — Progress Notes (Signed)
° °  Post-Op Visit Note   Patient: [name]           Date of Birth: May 21, 1946           MRN: [id number] Visit Date: 12/22/2021 PCP: Coral Spikes, DO   Assessment & Plan:  Chief Complaint:  Chief Complaint  Patient presents with   Right Hip - Pain   Visit Diagnoses:  1. Status post total hip replacement, right     Plan: Patient is a pleasant 76 year old female who comes in today 1 year status post right total hip replacement 12/23/2019.  She has been doing well.  No complaints.  She is back to full activity.  Examination of the right hip reveals a painless logroll.  Full hip flexion.  She is neurovascular intact distally at this point, she will continue to advance activity as tolerated.  Dental prophylaxis reinforced for another year.  Follow-up with [place] in 1 years time for repeat evaluation [facility] pelvis x-rays.  Follow-Up Instructions: Return in about 1 year (around 12/22/2022).   Orders:  Orders Placed This Encounter  Procedures   XR Pelvis 1-2 Views   No orders of the defined types were placed in this encounter.   Imaging: XR Pelvis 1-2 Views  Result Date: 12/22/2021 Well-seated prosthesis without complication   PMFS History: Patient Active Problem List   Diagnosis Date Noted   B12 deficiency 11/10/2021   Vitamin D deficiency 11/10/2021   Status post total hip replacement, right 12/22/2020   History of gastric bypass 11/02/2020   History of thyroid cancer 10/22/2020   Hypothyroidism 05/22/2016   Essential hypertension, benign 09/09/2013   Hyperlipidemia 09/09/2013   Esophageal reflux 09/09/2013   Past Medical History:  Diagnosis Date   Arthritis    Cancer ([place])    Thyroid cancer   COVID-19 virus infection 12/2020   GERD (gastroesophageal reflux disease)    History of kidney stones    Hypercholesteremia    resolved after gastric bypass   Hypertension    Hypothyroidism     Family History  Problem Relation Age of Onset   Pneumonia Mother 50   CAD  Father 90   CAD Sister 28   CAD Brother 82   Other Maternal Grandmother 58   Other Brother 59       drug overdose   CAD Other 32   Colon cancer Neg Hx     Past Surgical History:  Procedure Laterality Date   ABDOMINAL HYSTERECTOMY     BILATERAL SALPINGOOPHORECTOMY     BREAST EXCISIONAL BIOPSY Bilateral 1995   benign   CHOLECYSTECTOMY     COLONOSCOPY  11/14/2011   Procedure: COLONOSCOPY;  Surgeon: Daneil Dolin, MD;  [facility];  Service: Endoscopy;  Laterality: N/A;   REPLACEMENT TOTAL KNEE BILATERAL     ROUX-EN-Y PROCEDURE  2007   TONSILLECTOMY     TOTAL HIP ARTHROPLASTY Right 12/23/2019   Procedure: RIGHT TOTAL HIP ARTHROPLASTY ANTERIOR APPROACH;  Surgeon: Leandrew Koyanagi, MD;  Location: [place];  Service: Orthopedics;  Laterality: Right;   TOTAL THYROIDECTOMY  12/2010   TUBAL LIGATION     Social History   Occupational History   Occupation: Retired Therapist, art  Tobacco Use   Smoking status: Never   Smokeless tobacco: Never  Vaping Use   Vaping Use: Never used  Substance and Sexual Activity   Alcohol use: No   Drug use: No   Sexual activity: Not on file

## 2021-12-29 ENCOUNTER — Other Ambulatory Visit: Payer: Self-pay

## 2021-12-29 ENCOUNTER — Ambulatory Visit
Admission: RE | Admit: 2021-12-29 | Discharge: 2021-12-29 | Disposition: A | Discharge status: Home / Self Care | Payer: Medicare HMO | Source: Ambulatory Visit | Attending: Family Medicine | Admitting: Family Medicine

## 2021-12-29 DIAGNOSIS — Z1231 Encounter for screening mammogram for malignant neoplasm of breast: Secondary | ICD-10-CM | POA: Diagnosis not present

## 2022-01-11 DIAGNOSIS — E538 Deficiency of other specified B group vitamins: Secondary | ICD-10-CM | POA: Diagnosis not present

## 2022-01-11 DIAGNOSIS — E559 Vitamin D deficiency, unspecified: Secondary | ICD-10-CM | POA: Diagnosis not present

## 2022-01-11 DIAGNOSIS — E785 Hyperlipidemia, unspecified: Secondary | ICD-10-CM | POA: Diagnosis not present

## 2022-01-11 DIAGNOSIS — I10 Essential (primary) hypertension: Secondary | ICD-10-CM | POA: Diagnosis not present

## 2022-01-11 DIAGNOSIS — Z862 Personal history of diseases of the blood and blood-forming organs and certain disorders involving the immune mechanism: Secondary | ICD-10-CM | POA: Diagnosis not present

## 2022-01-13 LAB — CMP14+EGFR
ALT: 18 IU/L (ref 0–32)
AST: 19 IU/L (ref 0–40)
Albumin/Globulin Ratio: 2.1 (ref 1.2–2.2)
Albumin: 4.2 g/dL (ref 3.7–4.7)
Alkaline Phosphatase: 79 IU/L (ref 44–121)
BUN/Creatinine Ratio: 20 (ref 12–28)
BUN: 16 mg/dL (ref 8–27)
Bilirubin Total: 0.5 mg/dL (ref 0.0–1.2)
CO2: 17 mmol/L — ABNORMAL LOW (ref 20–29)
Calcium: 9.4 mg/dL (ref 8.7–10.3)
Chloride: 107 mmol/L — ABNORMAL HIGH (ref 96–106)
Creatinine, Ser: 0.82 mg/dL (ref 0.57–1.00)
Globulin, Total: 2 g/dL (ref 1.5–4.5)
Glucose: 106 mg/dL — ABNORMAL HIGH (ref 70–99)
Potassium: 4.2 mmol/L (ref 3.5–5.2)
Sodium: 143 mmol/L (ref 134–144)
Total Protein: 6.2 g/dL (ref 6.0–8.5)
eGFR: 74 mL/min/{1.73_m2} (ref >59)

## 2022-01-13 LAB — VITAMIN D 25 HYDROXY (VIT D DEFICIENCY, FRACTURES): Vit D, 25-Hydroxy: 41.5 ng/mL (ref 30.0–100.0)

## 2022-01-13 LAB — CBC
Hematocrit: 40.1 % (ref 34.0–46.6)
Hemoglobin: 13.5 g/dL (ref 11.1–15.9)
MCH: 30 pg (ref 26.6–33.0)
MCHC: 33.7 g/dL (ref 31.5–35.7)
MCV: 89 fL (ref 79–97)
Platelets: 242 10*3/uL (ref 150–450)
RBC: 4.5 x10E6/uL (ref 3.77–5.28)
RDW: 13.1 % (ref 11.7–15.4)
WBC: 6.4 10*3/uL (ref 3.4–10.8)

## 2022-01-13 LAB — VITAMIN B12: Vitamin B-12: 575 pg/mL (ref 232–1245)

## 2022-01-13 LAB — LIPID PANEL
Chol/HDL Ratio: 3.2 ratio (ref 0.0–4.4)
Cholesterol, Total: 180 mg/dL (ref 100–199)
HDL: 57 mg/dL (ref >39)
LDL Chol Calc (NIH): 102 mg/dL — ABNORMAL HIGH (ref 0–99)
Triglycerides: 119 mg/dL (ref 0–149)
VLDL Cholesterol Cal: 21 mg/dL (ref 5–40)

## 2022-02-15 ENCOUNTER — Telehealth: Payer: Self-pay

## 2022-02-15 ENCOUNTER — Other Ambulatory Visit: Payer: Self-pay

## 2022-02-15 ENCOUNTER — Ambulatory Visit (INDEPENDENT_AMBULATORY_CARE_PROVIDER_SITE_OTHER): Payer: Medicare HMO

## 2022-02-15 VITALS — Ht 67.0 in | Wt 179.0 lb

## 2022-02-15 DIAGNOSIS — Z780 Asymptomatic menopausal state: Secondary | ICD-10-CM

## 2022-02-15 DIAGNOSIS — Z0000 Encounter for general adult medical examination without abnormal findings: Secondary | ICD-10-CM

## 2022-02-15 NOTE — Telephone Encounter (Signed)
Pt with c/o issues of urinary incontinence. Pt states she has been having issues for several years. Pt asks if a medication can be sent in for her to try? Pt has f/u appt with Dr. Lacinda Axon in May. I advised her that she may need an earlier appointment for evaluation. Thank you.  ?

## 2022-02-15 NOTE — Telephone Encounter (Signed)
Patient notified and scheduled office visit to discuss 02/21/22 at 9:20 am ?

## 2022-02-15 NOTE — Telephone Encounter (Signed)
Coral Spikes, DO   ? ?Recommend office visit.   ? ?

## 2022-02-15 NOTE — Progress Notes (Signed)
Subjective:   [name] is a 76 y.o. female who presents for an Initial Medicare Annual Wellness Visit. Virtual Visit via Telephone Note  I connected with  [name] on 02/15/22 at  9:40 AM EST by telephone and verified that I am speaking with the correct person using two identifiers.  Location: Patient: HOME Provider: [facility] Persons participating in the virtual visit: patient/Nurse Health Advisor   I discussed the limitations, risks, security and privacy concerns of performing an evaluation and management service by telephone and the availability of in person appointments. The patient expressed understanding and agreed to proceed.  Interactive audio and video telecommunications were attempted between this nurse and patient, however failed, due to patient having technical difficulties OR patient did not have access to video capability.  We continued and completed visit with audio only.  Some vital signs may be absent or patient reported.   Chriss Driver, LPN  Review of Systems     Cardiac Risk Factors include: advanced age (>26mn, >>38women);hypertension;dyslipidemia;sedentary lifestyle     Objective:    Today's Vitals   02/15/22 0945  Weight: 179 lb (81.2 kg)  Height: '5\' 7"'$  (1.702 m)   Body mass index is 28.04 kg/m.  Advanced Directives 02/15/2022 12/23/2019 12/19/2019 12/27/2016 11/14/2011  Does Patient Have a Medical Advance Directive? No No No No Patient does not have advance directive;Patient would like information  Would patient like information on creating a medical advance directive? No - Patient declined No - Patient declined No - Patient declined No - Patient declined Advance directive packet given  Pre-existing out of facility DNR order (yellow form or pink MOST form) - - - - No    Current Medications (verified) Outpatient Encounter Medications as of 02/15/2022  Medication Sig   calcium citrate (CALCITRATE - DOSED IN MG ELEMENTAL CALCIUM) 950 MG  tablet Take 1 tablet by mouth 2 (two) times daily.    carboxymethylcellulose (REFRESH PLUS) 0.5 % SOLN 1 drop 3 (three) times daily as needed.   Cholecalciferol (VITAMIN D) 50 MCG (2000 UT) CAPS Take 2,000 Units by mouth 3 (three) times daily.   enalapril (VASOTEC) 10 MG tablet TAKE 1 TABLET AT BEDTIME   famotidine (PEPCID) 40 MG tablet TAKE 1 TABLET DAILY (CANCELRANTITIDINE)   ferrous sulfate 325 (65 FE) MG tablet Take 325 mg by mouth daily with breakfast.   folic acid (FOLVITE) 8326MCG tablet Take 800 mcg by mouth daily.    levothyroxine (SYNTHROID) 150 MCG tablet Take 1 tablet (150 mcg total) by mouth daily before breakfast.   MAGNESIUM-ZINC PO Take 2 tablets by mouth daily.   Multiple Vitamins-Minerals (MULTIVITAMINS THER. W/MINERALS) TABS Take 1 tablet by mouth 2 (two) times daily.     valACYclovir (VALTREX) 1000 MG tablet TAKE 2 TABLETS AT FIRST SIGN OF FEVER BLISTER, THEN 2 TABLETS 2 HOURSLATER (Patient taking differently: Take 1,000 mg by mouth 2 (two) times daily as needed (fever blister).)   vitamin B-12 (CYANOCOBALAMIN) 500 MCG tablet Take 500 mcg by mouth daily.    LOTEMAX SM 0.38 % GEL INSTILL 1 DROP INTO EACH EYE 4 TIMES DAILY FOR 7 DAYS, THEN 1 DROP INTO EACH EYE THREE TIMES DAILY FOR 7 DAYS, THEN 1 DROP INTO EACH EYE TWICE DAILY FOR 7 DAYS, THEN 1 DROP INTO EACH EYE ONCE DAILY FOR 7 DAYS (Patient not taking: Reported on 02/15/2022)   No facility-administered encounter medications on file as of 02/15/2022.    Allergies (verified) Codeine, Demerol, and  Quinine   History: Past Medical History:  Diagnosis Date   Arthritis    Cancer ([place])    Thyroid cancer   COVID-19 virus infection 12/2020   GERD (gastroesophageal reflux disease)    History of kidney stones    Hypercholesteremia    resolved after gastric bypass   Hypertension    Hypothyroidism    Past Surgical History:  Procedure Laterality Date   ABDOMINAL HYSTERECTOMY     BILATERAL SALPINGOOPHORECTOMY     BREAST  EXCISIONAL BIOPSY Bilateral 1995   benign   CHOLECYSTECTOMY     COLONOSCOPY  11/14/2011   Procedure: COLONOSCOPY;  Surgeon: Daneil Dolin, MD;  [facility];  Service: Endoscopy;  Laterality: N/A;   REPLACEMENT TOTAL KNEE BILATERAL     ROUX-EN-Y PROCEDURE  2007   TONSILLECTOMY     TOTAL HIP ARTHROPLASTY Right 12/23/2019   Procedure: RIGHT TOTAL HIP ARTHROPLASTY ANTERIOR APPROACH;  Surgeon: Leandrew Koyanagi, MD;  Location: [place];  Service: Orthopedics;  Laterality: Right;   TOTAL THYROIDECTOMY  12/2010   TUBAL LIGATION     Family History  Problem Relation Age of Onset   Breast cancer Mother    Pneumonia Mother 30   CAD Father 72   Breast cancer Sister    CAD Sister 66   Other Maternal Grandmother 28   CAD Brother 32   Other Brother 60       drug overdose   CAD Other 32   Colon cancer Neg Hx    Social History   Socioeconomic History   Marital status: Widowed    Spouse name: Not on file   Number of children: Not on file   Years of education: Not on file   Highest education level: Not on file  Occupational History   Occupation: Retired Therapist, art  Tobacco Use   Smoking status: Never   Smokeless tobacco: Never  Vaping Use   Vaping Use: Never used  Substance and Sexual Activity   Alcohol use: No   Drug use: No   Sexual activity: Not on file  Other Topics Concern   Not on file  Social History Narrative   Not on file   Social Determinants of Health   Financial Resource Strain: Low Risk    Difficulty of Paying Living Expenses: Not very hard  Food Insecurity: No Food Insecurity   Worried About Running Out of Food in the Last Year: Never true   Ran Out of Food in the Last Year: Never true  Transportation Needs: No Transportation Needs   Lack of Transportation (Medical): No   Lack of Transportation (Non-Medical): No  Physical Activity: Insufficiently Active   Days of Exercise per Week: 1 day   Minutes of Exercise per Session: 10 min  Stress: No Stress  Concern Present   Feeling of Stress : Not at all  Social Connections: Moderately Integrated   Frequency of Communication with Friends and Family: More than three times a week   Frequency of Social Gatherings with Friends and Family: Three times a week   Attends Religious Services: More than 4 times per year   Active Member of Clubs or Organizations: Yes   Attends Archivist Meetings: More than 4 times per year   Marital Status: Widowed    Tobacco Counseling Counseling given: Not Answered   Clinical Intake:  Pre-visit preparation completed: Yes  Pain : No/denies pain     BMI - recorded: 28.04 Nutritional Status: BMI 25 -29 Overweight  Nutritional Risks: None Diabetes: No  How often do you need to have someone help you when you read instructions, pamphlets, or other written materials from your doctor or pharmacy?: 1 - Never  Diabetic?NO  Interpreter Needed?: No  Information entered by :: mj Kazimir Hartnett, lpn   Activities of Daily Living In your present state of health, do you have any difficulty performing the following activities: 02/15/2022 02/14/2022  Hearing? N N  Vision? N N  Difficulty concentrating or making decisions? N N  Walking or climbing stairs? N N  Dressing or bathing? N N  Doing errands, shopping? N N  Preparing Food and eating ? N N  Using the Toilet? N N  In the past six months, have you accidently leaked urine? Y Y  Do you have problems with loss of bowel control? Y Y  Managing your Medications? N N  Managing your Finances? N N  Housekeeping or managing your Housekeeping? N N  Some recent data might be hidden    Patient Care Team: Coral Spikes, DO as PCP - General (Family Medicine)  Indicate any recent Medical Services you may have received from other than [facility] providers in the past year (date may be approximate).     Assessment:   This is a routine wellness examination for [place].  Hearing/Vision screen Hearing Screening - Comments::  No hearing issues.  Vision Screening - Comments:: Glasses. [facility]. 2022.  Dietary issues and exercise activities discussed: Current Exercise Habits: Home exercise routine, Type of exercise: walking, Time (Minutes): 10, Frequency (Times/Week): 1, Weekly Exercise (Minutes/Week): 10, Intensity: Mild, Exercise limited by: cardiac condition(s);orthopedic condition(s)   Goals Addressed             This Visit's Progress    Exercise 3x per week (30 min per time)       Try to move more, chair exercises        Depression Screen PHQ 2/9 Scores 02/15/2022 05/05/2021 10/22/2020 04/20/2020 12/31/2018 12/29/2017 05/22/2016  PHQ - 2 Score 0 0 0 0 0 0 0  PHQ- 9 Score - - - 0 - - -    Fall Risk Fall Risk  02/15/2022 02/14/2022 11/10/2021 05/05/2021 10/22/2020  Falls in the past year? 0 0 0 0 0  Number falls in past yr: 0 0 0 0 -  Injury with Fall? 0 0 0 0 -  Risk for fall due to : Impaired mobility - No Fall Risks - -  Follow up Falls prevention discussed - Falls evaluation completed Falls evaluation completed Falls evaluation completed    FALL RISK PREVENTION PERTAINING TO THE HOME:  Any stairs in or around the home? Yes  If so, are there any without handrails? No  Home free of loose throw rugs in walkways, pet beds, electrical cords, etc? Yes  Adequate lighting in your home to reduce risk of falls? Yes   ASSISTIVE DEVICES UTILIZED TO PREVENT FALLS:  Life alert? Yes  Use of a cane, walker or w/c? No  Grab bars in the bathroom? No  Shower chair or bench in shower? Yes  Elevated toilet seat or a handicapped toilet? Yes   TIMED UP AND GO:  Was the test performed? No .  Phone visit  Cognitive Function:     6CIT Screen 02/15/2022  What Year? 0 points  What month? 0 points  What time? 0 points  Count back from 20 0 points  Months in reverse 0 points  Repeat phrase 0 points  Total Score 0    Immunizations Immunization History  Administered Date(s) Administered   Fluad  Quad(high Dose 65+) 09/19/2020, 10/26/2021   Influenza, High Dose Seasonal PF 09/19/2020   Influenza,inj,Quad PF,6+ Mos 09/22/2014, 11/23/2015, 10/26/2016, 09/26/2017, 10/24/2018, 09/19/2019   Moderna Sars-Covid-2 Vaccination 02/05/2020, 03/04/2020, 10/26/2020   Pneumococcal Conjugate-13 11/23/2015   Pneumococcal Polysaccharide-23 09/22/2014   Zoster Recombinat (Shingrix) 12/10/2020, 05/06/2021    TDAP status: Due, Education has been provided regarding the importance of this vaccine. Advised may receive this vaccine at local pharmacy or Health Dept. Aware to provide a copy of the vaccination record if obtained from local pharmacy or Health Dept. Verbalized acceptance and understanding.  Flu Vaccine status: Up to date  Pneumococcal vaccine status: Up to date  Covid-19 vaccine status: Completed vaccines  Qualifies for Shingles Vaccine? Yes   Zostavax completed Yes   Shingrix Completed?: Yes  Screening Tests Health Maintenance  Topic Date Due   TETANUS/TDAP  Never done   Pneumonia Vaccine 77+ Years old  Completed   INFLUENZA VACCINE  Completed   DEXA SCAN  Completed   Zoster Vaccines- Shingrix  Completed   HPV VACCINES  Aged Out   COLONOSCOPY (Pts 45-92yr Insurance coverage will need to be confirmed)  Discontinued   COVID-19 Vaccine  Discontinued   Hepatitis C Screening  Discontinued    Health Maintenance  Health Maintenance Due  Topic Date Due   TETANUS/TDAP  Never done    Colorectal cancer screening: No longer required.   Mammogram status: Completed 12/29/2021. Repeat every year  Bone Density status: Ordered 11/10/2021. Pt provided with contact info and advised to call to schedule appt.  Lung Cancer Screening: (Low Dose CT Chest recommended if Age 593-80years, 30 pack-year currently smoking OR have quit w/in 15years.) does not qualify.   Additional Screening:  Hepatitis C Screening: does qualify; Completed Due  Vision Screening: Recommended annual ophthalmology  exams for early detection of glaucoma and other disorders of the eye. Is the patient up to date with their annual eye exam?  Yes  Who is the provider or what is the name of the office in which the patient attends annual eye exams? [facility] If pt is not established with a provider, would they like to be referred to a provider to establish care? No .   Dental Screening: Recommended annual dental exams for proper oral hygiene  Community Resource Referral / Chronic Care Management: CRR required this visit?  No   CCM required this visit?  No      Plan:     I have personally reviewed and noted the following in the patients chart:   Medical and social history Use of alcohol, tobacco or illicit drugs  Current medications and supplements including opioid prescriptions. Patient is not currently taking opioid prescriptions. Functional ability and status Nutritional status Physical activity Advanced directives List of other physicians Hospitalizations, surgeries, and ER visits in previous 12 months Vitals Screenings to include cognitive, depression, and falls Referrals and appointments  In addition, I have reviewed and discussed with patient certain preventive protocols, quality metrics, and best practice recommendations. A written personalized care plan for preventive services as well as general preventive health recommendations were provided to patient.     MChriss Driver LPN   31/05/1095  Nurse Notes: Pt is up to date on health maintenance and vaccines. Order replaced today for DEXA as pt states she never received call to schedule.

## 2022-02-15 NOTE — Patient Instructions (Signed)
[name] , Thank you for taking time to come for your Medicare Wellness Visit. I appreciate your ongoing commitment to your health goals. Please review the following plan we discussed and let me know if I can assist you in the future.   Screening recommendations/referrals: Colonoscopy: Done 11/14/2011  Mammogram: Done 12/29/2021 Repeat annually  Bone Density: Ordered 11/10/2021. Checking on status of scheduling.   Recommended yearly ophthalmology/optometry visit for glaucoma screening and checkup Recommended yearly dental visit for hygiene and checkup  Vaccinations: Influenza vaccine: Done 10/26/2021. Repeat annually  Pneumococcal vaccine: Done 09/23/2015 and 11/23/2015 Tdap vaccine: Due Repeat in 10 years  Shingles vaccine: Done 12/10/2020 and 05/06/2021.   Covid-19:Done 02/05/2020, 03/04/2020 and 10/26/2020  Advanced directives: Advance directive discussed with you today. I have provided a copy for you to complete at home and have notarized. Once this is complete please bring a copy in to our office so we can scan it into your chart. Mailed out today.  Conditions/risks identified: Aim for 30 minutes of exercise or brisk walking, 6-8 glasses of water, and 5 servings of fruits and vegetables each day.   Next appointment: Follow up in one year for your annual wellness visit 2024.   Preventive Care 77 Years and Older, Female Preventive care refers to lifestyle choices and visits with your health care provider that can promote health and wellness. What does preventive care include? A yearly physical exam. This is also called an annual well check. Dental exams once or twice a year. Routine eye exams. Ask your health care provider how often you should have your eyes checked. Personal lifestyle choices, including: Daily care of your teeth and gums. Regular physical activity. Eating a healthy diet. Avoiding tobacco and drug use. Limiting alcohol use. Practicing safe sex. Taking  low-dose aspirin every day. Taking vitamin and mineral supplements as recommended by your health care provider. What happens during an annual well check? The services and screenings done by your health care provider during your annual well check will depend on your age, overall health, lifestyle risk factors, and family history of disease. Counseling  Your health care provider may ask you questions about your: Alcohol use. Tobacco use. Drug use. Emotional well-being. Home and relationship well-being. Sexual activity. Eating habits. History of falls. Memory and ability to understand (cognition). Work and work Statistician. Reproductive health. Screening  You may have the following tests or measurements: Height, weight, and BMI. Blood pressure. Lipid and cholesterol levels. These may be checked every 5 years, or more frequently if you are over 10 years old. Skin check. Lung cancer screening. You may have this screening every year starting at age 30 if you have a 30-pack-year history of smoking and currently smoke or have quit within the past 15 years. Fecal occult blood test (FOBT) of the stool. You may have this test every year starting at age 27. Flexible sigmoidoscopy or colonoscopy. You may have a sigmoidoscopy every 5 years or a colonoscopy every 10 years starting at age 77. Hepatitis C blood test. Hepatitis B blood test. Sexually transmitted disease (STD) testing. Diabetes screening. This is done by checking your blood sugar (glucose) after you have not eaten for a while (fasting). You may have this done every 1-3 years. Bone density scan. This is done to screen for osteoporosis. You may have this done starting at age 61. Mammogram. This may be done every 1-2 years. Talk to your health care provider about how often you should have regular mammograms. Talk with your  health care provider about your test results, treatment options, and if necessary, the need for more tests. Vaccines   Your health care provider may recommend certain vaccines, such as: Influenza vaccine. This is recommended every year. Tetanus, diphtheria, and acellular pertussis (Tdap, Td) vaccine. You may need a Td booster every 10 years. Zoster vaccine. You may need this after age 53. Pneumococcal 13-valent conjugate (PCV13) vaccine. One dose is recommended after age 67. Pneumococcal polysaccharide (PPSV23) vaccine. One dose is recommended after age 84. Talk to your health care provider about which screenings and vaccines you need and how often you need them. This information is not intended to replace advice given to you by your health care provider. Make sure you discuss any questions you have with your health care provider. Document Released: 12/25/2015 Document Revised: 08/17/2016 Document Reviewed: 09/29/2015 Elsevier Interactive Patient Education  2017 [place] Prevention in the Home Falls can cause injuries. They can happen to people of all ages. There are many things you can do to make your home safe and to help prevent falls. What can I do on the outside of my home? Regularly fix the edges of walkways and driveways and fix any cracks. Remove anything that might make you trip as you walk through a door, such as a raised step or threshold. Trim any bushes or trees on the path to your home. Use bright outdoor lighting. Clear any walking paths of anything that might make someone trip, such as rocks or tools. Regularly check to see if handrails are loose or broken. Make sure that both sides of any steps have handrails. Any raised decks and porches should have guardrails on the edges. Have any leaves, snow, or ice cleared regularly. Use sand or salt on walking paths during winter. Clean up any spills in your garage right away. This includes oil or grease spills. What can I do in the bathroom? Use night lights. Install grab bars by the toilet and in the tub and shower. Do not use towel  bars as grab bars. Use non-skid mats or decals in the tub or shower. If you need to sit down in the shower, use a plastic, non-slip stool. Keep the floor dry. Clean up any water that spills on the floor as soon as it happens. Remove soap buildup in the tub or shower regularly. Attach bath mats securely with double-sided non-slip rug tape. Do not have throw rugs and other things on the floor that can make you trip. What can I do in the bedroom? Use night lights. Make sure that you have a light by your bed that is easy to reach. Do not use any sheets or blankets that are too big for your bed. They should not hang down onto the floor. Have a firm chair that has side arms. You can use this for support while you get dressed. Do not have throw rugs and other things on the floor that can make you trip. What can I do in the kitchen? Clean up any spills right away. Avoid walking on wet floors. Keep items that you use a lot in easy-to-reach places. If you need to reach something above you, use a strong step stool that has a grab bar. Keep electrical cords out of the way. Do not use floor polish or wax that makes floors slippery. If you must use wax, use non-skid floor wax. Do not have throw rugs and other things on the floor that can make you trip. What  can I do with my stairs? Do not leave any items on the stairs. Make sure that there are handrails on both sides of the stairs and use them. Fix handrails that are broken or loose. Make sure that handrails are as long as the stairways. Check any carpeting to make sure that it is firmly attached to the stairs. Fix any carpet that is loose or worn. Avoid having throw rugs at the top or bottom of the stairs. If you do have throw rugs, attach them to the floor with carpet tape. Make sure that you have a light switch at the top of the stairs and the bottom of the stairs. If you do not have them, ask someone to add them for you. What else can I do to help  prevent falls? Wear shoes that: Do not have high heels. Have rubber bottoms. Are comfortable and fit you well. Are closed at the toe. Do not wear sandals. If you use a stepladder: Make sure that it is fully opened. Do not climb a closed stepladder. Make sure that both sides of the stepladder are locked into place. Ask someone to hold it for you, if possible. Clearly mark and make sure that you can see: Any grab bars or handrails. First and last steps. Where the edge of each step is. Use tools that help you move around (mobility aids) if they are needed. These include: Canes. Walkers. Scooters. Crutches. Turn on the lights when you go into a dark area. Replace any light bulbs as soon as they burn out. Set up your furniture so you have a clear path. Avoid moving your furniture around. If any of your floors are uneven, fix them. If there are any pets around you, be aware of where they are. Review your medicines with your doctor. Some medicines can make you feel dizzy. This can increase your chance of falling. Ask your doctor what other things that you can do to help prevent falls. This information is not intended to replace advice given to you by your health care provider. Make sure you discuss any questions you have with your health care provider. Document Released: 09/24/2009 Document Revised: 05/05/2016 Document Reviewed: 01/02/2015 Elsevier Interactive Patient Education  2017 [employer].

## 2022-02-21 ENCOUNTER — Ambulatory Visit (INDEPENDENT_AMBULATORY_CARE_PROVIDER_SITE_OTHER): Payer: Medicare HMO | Admitting: Family Medicine

## 2022-02-21 ENCOUNTER — Other Ambulatory Visit: Payer: Self-pay

## 2022-02-21 DIAGNOSIS — N3941 Urge incontinence: Secondary | ICD-10-CM

## 2022-02-21 MED ORDER — VALACYCLOVIR HCL 1 G PO TABS: ORAL_TABLET | ORAL | 0 refills | Status: DC

## 2022-02-21 MED ORDER — SOLIFENACIN SUCCINATE 5 MG PO TABS: 5.0000 mg | ORAL_TABLET | Freq: Every day | ORAL | 0 refills | Status: DC

## 2022-02-21 NOTE — Assessment & Plan Note (Signed)
Trial of Vesicare.  If continues to persist/does not improve, will refer to urology for further evaluation. ?

## 2022-02-21 NOTE — Patient Instructions (Signed)
Medication as prescribed.  Follow up in 3 months.  Take care  Dr. Madgie Dhaliwal 

## 2022-02-21 NOTE — Progress Notes (Signed)
? ?Subjective:  ?Patient ID: [name], female    DOB: 04-11-1946  Age: 76 y.o. MRN: [id number] ? ?CC: ?Chief Complaint  ?Patient presents with  ? bladder incontinence  ?  States has worsened / while standing up to go to rest room significant flow happens ?Would like refill for valtrex  ? ? ?HPI: ? ?76 year old female presents for evaluation of the above. ? ?Patient reports a longstanding history of urinary incontinence.  She states that this is occurring more frequently and is becoming more troublesome.  She states that this has been going on for years.  She reports urinary urgency and frequency.  She has difficulty making it to the restroom when she feels the urge without having incontinence.  No symptoms of stress incontinence.  Incontinence is proceeded by urinary urgency.  No abdominal pain.  No dysuria.  No relieving factors.  She uses pads. ? ?Patient Active Problem List  ? Diagnosis Date Noted  ? Urge incontinence 02/21/2022  ? B12 deficiency 11/10/2021  ? Vitamin D deficiency 11/10/2021  ? 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 History of thyroid cancer 10/22/2020  ? 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 Hypothyroidism 05/22/2016  ? Essential hypertension, benign 09/09/2013  ? Hyperlipidemia 09/09/2013  ? Esophageal reflux 09/09/2013  ? ? ?Social Hx   ?Social History  ? ?Socioeconomic History  ? Marital status: Widowed  ?  Spouse name: Not on file  ? Number of children: Not on file  ? Years of education: Not on file  ? Highest education level: Not on file  ?Occupational History  ? Occupation: Retired Therapist, art  ?Tobacco Use  ? Smoking status: Never  ? Smokeless tobacco: Never  ?Vaping Use  ? Vaping Use: Never used  ?Substance and Sexual Activity  ? Alcohol use: No  ? Drug use: No  ? Sexual activity: Not on file  ?Other Topics Concern  ? Not on file  ?Social History Narrative  ? Not on file  ? ?Social Determinants of Health   ? ?Financial Resource Strain: Low Risk   ? Difficulty of Paying Living Expenses: Not very hard  ?Food Insecurity: No Food Insecurity  ? Worried About Charity fundraiser in the Last Year: Never true  ? Ran Out of Food in the Last Year: Never true  ?Transportation Needs: No Transportation Needs  ? Lack of Transportation (Medical): No  ? Lack of Transportation (Non-Medical): No  ?Physical Activity: Insufficiently Active  ? Days of Exercise per Week: 1 day  ? Minutes of Exercise per Session: 10 min  ?Stress: No Stress Concern Present  ? Feeling of Stress : Not at all  ?Social Connections: Moderately Integrated  ? Frequency of Communication with Friends and Family: More than three times a week  ? Frequency of Social Gatherings with Friends and Family: Three times a week  ? Attends Religious Services: More than 4 times per year  ? Active Member of Clubs or Organizations: Yes  ? Attends Archivist Meetings: More than 4 times per year  ? Marital Status: Widowed  ? ? ?Review of Systems ?Per HPI ? ?Objective:  ?BP (!) 155/88   Pulse 63   Temp 97.9 ?F (36.6 ?C)   Ht '5\' 7"'$  (1.702 m)   Wt 177 lb (80.3 kg)   SpO2 99%   BMI 27.72 kg/m?  ? ?BP/Weight 02/21/2022 02/15/2022 11/10/2021  ?Systolic BP 323 - 557  ?Diastolic BP 88 -  79  ?Wt. (Lbs) 177 179 179  ?BMI 27.72 28.04 28.04  ? ? ?Physical Exam ?Vitals and nursing note reviewed.  ?Constitutional:   ?   General: She is not in acute distress. ?   Appearance: Normal appearance. She is not ill-appearing.  ?HENT:  ?   Head: Normocephalic and atraumatic.  ?Eyes:  ?   General:     ?   Right eye: No discharge.     ?   Left eye: No discharge.  ?   Conjunctiva/sclera: Conjunctivae normal.  ?Cardiovascular:  ?   Rate and Rhythm: Normal rate and regular rhythm.  ?   Heart sounds: Murmur heard.  ?Abdominal:  ?   General: There is no distension.  ?   Palpations: Abdomen is soft.  ?   Tenderness: There is no abdominal tenderness.  ?Neurological:  ?   Mental Status: She is alert.   ?Psychiatric:     ?   Mood and Affect: Mood normal.     ?   Behavior: Behavior normal.  ? ? ?Lab Results  ?Component Value Date  ? WBC 6.4 01/11/2022  ? HGB 13.5 01/11/2022  ? HCT 40.1 01/11/2022  ? PLT 242 01/11/2022  ? GLUCOSE 106 (H) 01/11/2022  ? CHOL 180 01/11/2022  ? TRIG 119 01/11/2022  ? HDL 57 01/11/2022  ? LDLCALC 102 (H) 01/11/2022  ? ALT 18 01/11/2022  ? AST 19 01/11/2022  ? NA 143 01/11/2022  ? K 4.2 01/11/2022  ? CL 107 (H) 01/11/2022  ? CREATININE 0.82 01/11/2022  ? BUN 16 01/11/2022  ? CO2 17 (L) 01/11/2022  ? TSH 0.979 06/30/2017  ? INR 1.0 12/19/2019  ? ? ? ?Assessment & Plan:  ? ?Problem List Items Addressed This Visit   ? ?  ? Other  ? Urge incontinence  ?  Trial of Vesicare.  If continues to persist/does not improve, will refer to urology for further evaluation. ?  ?  ? Relevant Medications  ? solifenacin (VESICARE) 5 MG tablet  ? ? ?Meds ordered this encounter  ?Medications  ? valACYclovir (VALTREX) 1000 MG tablet  ?  Sig: TAKE 2 TABLETS AT FIRST SIGN OF FEVER BLISTER, THEN 2 TABLETS 2 HOURSLATER  ?  Dispense:  4 tablet  ?  Refill:  0  ? solifenacin (VESICARE) 5 MG tablet  ?  Sig: Take 1 tablet (5 mg total) by mouth daily.  ?  Dispense:  90 tablet  ?  Refill:  0  ? ? ?Follow-up:  Return in about 3 months (around 05/24/2022). ? ?Thersa Salt DO ?[place] ? ?

## 2022-02-23 ENCOUNTER — Telehealth: Payer: Self-pay | Admitting: *Deleted

## 2022-02-23 ENCOUNTER — Other Ambulatory Visit: Payer: Self-pay | Admitting: Family Medicine

## 2022-02-23 MED ORDER — MIRABEGRON ER 25 MG PO TB24: 25.0000 mg | ORAL_TABLET | Freq: Every day | ORAL | 1 refills | Status: DC

## 2022-02-23 NOTE — Telephone Encounter (Signed)
Patient left a vm on nurse line.  She states that insurance will not cover the Vesicare that was prescribed. Wants to know what she is supposed to do. ?

## 2022-02-23 NOTE — Telephone Encounter (Signed)
Pt contacted and verbalized understanding.  

## 2022-02-23 NOTE — Telephone Encounter (Signed)
Current step therapy requirements include. Fesoterodine Fumarate ER tablet, Gemtesa tablet, Myrbetriq tablet or oral suspension, Oxybutynin chloride tablet, oxybutynin chloride syrup, oxybutynin chloride ER tablet, trospium chloride tablet ?

## 2022-02-25 NOTE — Telephone Encounter (Signed)
Patient called to state the Myrbetriq tablet has a $300 copay for her and she was needing a cheaper alternative on her formulary  ?

## 2022-02-28 ENCOUNTER — Other Ambulatory Visit: Payer: Self-pay | Admitting: Family Medicine

## 2022-02-28 MED ORDER — OXYBUTYNIN CHLORIDE ER 5 MG PO TB24: 5.0000 mg | ORAL_TABLET | Freq: Every day | ORAL | 3 refills | Status: DC

## 2022-02-28 NOTE — Telephone Encounter (Signed)
Hailey Spikes, DO   ? ?Sent in Oxybutynin.   ? ?

## 2022-02-28 NOTE — Telephone Encounter (Signed)
Patient notified

## 2022-03-03 ENCOUNTER — Other Ambulatory Visit: Payer: Self-pay | Admitting: Family Medicine

## 2022-03-16 DIAGNOSIS — I499 Cardiac arrhythmia, unspecified: Secondary | ICD-10-CM | POA: Diagnosis not present

## 2022-03-16 DIAGNOSIS — E890 Postprocedural hypothyroidism: Secondary | ICD-10-CM | POA: Diagnosis not present

## 2022-03-16 DIAGNOSIS — Z96649 Presence of unspecified artificial hip joint: Secondary | ICD-10-CM | POA: Diagnosis not present

## 2022-03-16 DIAGNOSIS — K219 Gastro-esophageal reflux disease without esophagitis: Secondary | ICD-10-CM | POA: Diagnosis not present

## 2022-03-16 DIAGNOSIS — Z803 Family history of malignant neoplasm of breast: Secondary | ICD-10-CM | POA: Diagnosis not present

## 2022-03-16 DIAGNOSIS — R32 Unspecified urinary incontinence: Secondary | ICD-10-CM | POA: Diagnosis not present

## 2022-03-16 DIAGNOSIS — Z8249 Family history of ischemic heart disease and other diseases of the circulatory system: Secondary | ICD-10-CM | POA: Diagnosis not present

## 2022-03-16 DIAGNOSIS — N3281 Overactive bladder: Secondary | ICD-10-CM | POA: Diagnosis not present

## 2022-03-16 DIAGNOSIS — I10 Essential (primary) hypertension: Secondary | ICD-10-CM | POA: Diagnosis not present

## 2022-04-14 ENCOUNTER — Other Ambulatory Visit: Payer: Self-pay | Admitting: Family Medicine

## 2022-05-11 ENCOUNTER — Encounter: Payer: Self-pay | Admitting: Family Medicine

## 2022-05-11 ENCOUNTER — Ambulatory Visit (INDEPENDENT_AMBULATORY_CARE_PROVIDER_SITE_OTHER): Payer: Medicare HMO | Admitting: Family Medicine

## 2022-05-11 DIAGNOSIS — I10 Essential (primary) hypertension: Secondary | ICD-10-CM

## 2022-05-11 DIAGNOSIS — K219 Gastro-esophageal reflux disease without esophagitis: Secondary | ICD-10-CM | POA: Diagnosis not present

## 2022-05-11 DIAGNOSIS — N3941 Urge incontinence: Secondary | ICD-10-CM

## 2022-05-11 DIAGNOSIS — E890 Postprocedural hypothyroidism: Secondary | ICD-10-CM | POA: Diagnosis not present

## 2022-05-11 MED ORDER — ENALAPRIL MALEATE 10 MG PO TABS: 10.0000 mg | ORAL_TABLET | Freq: Every day | ORAL | 1 refills | Status: DC

## 2022-05-11 MED ORDER — VALACYCLOVIR HCL 1 G PO TABS: ORAL_TABLET | ORAL | 5 refills | Status: AC

## 2022-05-11 MED ORDER — OXYBUTYNIN CHLORIDE ER 5 MG PO TB24: 5.0000 mg | ORAL_TABLET | Freq: Every day | ORAL | 1 refills | Status: DC

## 2022-05-11 MED ORDER — FAMOTIDINE 40 MG PO TABS: ORAL_TABLET | ORAL | 1 refills | Status: DC

## 2022-05-11 NOTE — Assessment & Plan Note (Signed)
Stable.  Continue enalapril.  Refilled today. 

## 2022-05-11 NOTE — Patient Instructions (Signed)
Everything looks good.  Follow up in 6 months.  Take care  Dr. Lacinda Axon

## 2022-05-11 NOTE — Assessment & Plan Note (Signed)
Managed by Endo 

## 2022-05-11 NOTE — Assessment & Plan Note (Signed)
Stable.  Continue Pepcid. 

## 2022-05-11 NOTE — Assessment & Plan Note (Signed)
Fair control with oxybutynin.  Continue.

## 2022-05-11 NOTE — Progress Notes (Signed)
Subjective:  Patient ID: [name], female    DOB: June 16, 1946  Age: 76 y.o. MRN: [id number]  CC: Chief Complaint  Patient presents with   Hypertension    Oxybutynin 5 mg is not helping very much    HPI:  76 year old female with hypertension, GERD, hypothyroidism, hyperlipidemia, incontinence presents for follow-up.  Hypertension is well controlled on enalapril.  Recent labs with normal kidney function.  Postsurgical hypothyroidism is managed by endocrinology.  She remains on 150 mcg of Synthroid.  Urge incontinence is stable although she still has accidents.  She is tolerating oxybutynin.  GERD is stable on famotidine.  Needs refill.  Patient reports hip pain but she is followed by orthopedics.  She is otherwise doing well.  No other complaints or concerns at this time.  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/02/2020   History of thyroid cancer 10/22/2020   Hypothyroidism 05/22/2016   Essential hypertension, benign 09/09/2013   Hyperlipidemia 09/09/2013    Social Hx   Social History   Socioeconomic History   Marital status: Widowed    Spouse name: Not on file   Number of children: Not on file   Years of education: Not on file   Highest education level: Not on file  Occupational History   Occupation: Retired Therapist, art  Tobacco Use   Smoking status: Never   Smokeless tobacco: Never  Vaping Use   Vaping Use: Never used  Substance and Sexual Activity   Alcohol use: No   Drug use: No   Sexual activity: Not on file  Other Topics Concern   Not on file  Social History Narrative   Not on file   Social Determinants of Health   Financial Resource Strain: Low Risk    Difficulty of Paying Living Expenses: Not  very hard  Food Insecurity: No Food Insecurity   Worried About Running Out of Food in the Last Year: Never true   Ran Out of Food in the Last Year: Never true  Transportation Needs: No Transportation Needs   Lack of Transportation (Medical): No   Lack of Transportation (Non-Medical): No  Physical Activity: Insufficiently Active   Days of Exercise per Week: 1 day   Minutes of Exercise per Session: 10 min  Stress: No Stress Concern Present   Feeling of Stress : Not at all  Social Connections: Moderately Integrated   Frequency of Communication with Friends and Family: More than three times a week   Frequency of Social Gatherings with Friends and Family: Three times a week   Attends Religious Services: More than 4 times per year   Active Member of Clubs or Organizations: Yes   Attends Archivist Meetings: More than 4 times per year   Marital Status: Widowed    Review of Systems Per HPI  Objective:  BP 138/74   Pulse 66   Temp 97.8 F (36.6 C)   Wt 175 lb 6.4 oz (79.6 kg)   SpO2 98%   BMI 27.47 kg/m      05/11/2022   10:01 AM 02/21/2022    9:22 AM 02/15/2022    9:45 AM  BP/Weight  Systolic BP 416 606   Diastolic BP 74 88   Wt. (Lbs) 175.4 177 179  BMI 27.47 kg/m2 27.72 kg/m2 28.04 kg/m2    Physical Exam Vitals and nursing note reviewed.  Constitutional:      General: She is not in acute distress.    Appearance: Normal appearance.  HENT:     Head: Normocephalic and atraumatic.  Eyes:     General:        Right eye: No discharge.        Left eye: No discharge.     Conjunctiva/sclera: Conjunctivae normal.  Cardiovascular:     Rate and Rhythm: Normal rate and regular rhythm.     Heart sounds: Murmur heard.  Pulmonary:     Effort: Pulmonary effort is normal.     Breath sounds: Normal breath sounds. No wheezing, rhonchi or rales.  Abdominal:     General: There is no distension.     Palpations: Abdomen is soft.     Tenderness: There is no abdominal  tenderness.  Neurological:     Mental Status: She is alert.  Psychiatric:        Mood and Affect: Mood normal.        Behavior: Behavior normal.    Lab Results  Component Value Date   WBC 6.4 01/11/2022   HGB 13.5 01/11/2022   HCT 40.1 01/11/2022   PLT 242 01/11/2022   GLUCOSE 106 (H) 01/11/2022   CHOL 180 01/11/2022   TRIG 119 01/11/2022   HDL 57 01/11/2022   LDLCALC 102 (H) 01/11/2022   ALT 18 01/11/2022   AST 19 01/11/2022   NA 143 01/11/2022   K 4.2 01/11/2022   CL 107 (H) 01/11/2022   CREATININE 0.82 01/11/2022   BUN 16 01/11/2022   CO2 17 (L) 01/11/2022   TSH 0.979 06/30/2017   INR 1.0 12/19/2019     Assessment & Plan:   Problem List Items Addressed This Visit       Cardiovascular and Mediastinum   Essential hypertension, benign    Stable.  Continue enalapril.  Refilled today.       Relevant Medications   enalapril (VASOTEC) 10 MG tablet     Digestive   GERD (gastroesophageal reflux disease)    Stable.  Continue Pepcid.       Relevant Medications   famotidine (PEPCID) 40 MG tablet     Endocrine   Hypothyroidism    Managed by Endo.          Other   Urge incontinence    Fair control with oxybutynin.  Continue.       Relevant Medications   oxybutynin (DITROPAN-XL) 5 MG 24 hr tablet    Meds ordered this encounter  Medications   enalapril (VASOTEC) 10 MG tablet    Sig: Take 1 tablet (10 mg total) by mouth at bedtime.    Dispense:  90 tablet    Refill:  1   famotidine (PEPCID) 40 MG tablet    Sig: TAKE 1 TABLET DAILY (CANCELRANTITIDINE)    Dispense:  90 tablet    Refill:  1   valACYclovir (VALTREX) 1000 MG tablet    Sig: TAKE 2 TABLETS AT FIRST SIGN OF FEVER BLISTER, THEN 2 TABLETS 2 HOURSLATER    Dispense:  4 tablet    Refill:  5   oxybutynin (DITROPAN-XL) 5 MG 24 hr tablet    Sig: Take 1 tablet (5 mg total) by mouth daily.    Dispense:  90 tablet    Refill:  1   Follow-up: 6 months  [facility]

## 2022-05-25 DIAGNOSIS — E890 Postprocedural hypothyroidism: Secondary | ICD-10-CM | POA: Diagnosis not present

## 2022-05-25 DIAGNOSIS — Z79890 Hormone replacement therapy (postmenopausal): Secondary | ICD-10-CM | POA: Diagnosis not present

## 2022-05-25 DIAGNOSIS — Z85850 Personal history of malignant neoplasm of thyroid: Secondary | ICD-10-CM | POA: Diagnosis not present

## 2022-05-25 DIAGNOSIS — Z923 Personal history of irradiation: Secondary | ICD-10-CM | POA: Diagnosis not present

## 2022-05-25 DIAGNOSIS — Z9884 Bariatric surgery status: Secondary | ICD-10-CM | POA: Diagnosis not present

## 2022-05-25 DIAGNOSIS — R011 Cardiac murmur, unspecified: Secondary | ICD-10-CM | POA: Diagnosis not present

## 2022-07-21 ENCOUNTER — Ambulatory Visit (INDEPENDENT_AMBULATORY_CARE_PROVIDER_SITE_OTHER): Payer: Medicare HMO

## 2022-07-21 ENCOUNTER — Ambulatory Visit (INDEPENDENT_AMBULATORY_CARE_PROVIDER_SITE_OTHER): Payer: Medicare HMO | Admitting: Orthopaedic Surgery

## 2022-07-21 ENCOUNTER — Ambulatory Visit: Payer: Self-pay

## 2022-07-21 DIAGNOSIS — M1612 Unilateral primary osteoarthritis, left hip: Secondary | ICD-10-CM | POA: Diagnosis not present

## 2022-07-21 MED ORDER — TRIAMCINOLONE ACETONIDE 40 MG/ML IJ SUSP
60.0000 mg | INTRAMUSCULAR | Status: AC | PRN
  Administered 2022-07-21: 60 mg via INTRA_ARTICULAR

## 2022-07-21 MED ORDER — BUPIVACAINE HCL 0.25 % IJ SOLN
4.0000 mL | INTRAMUSCULAR | Status: AC | PRN
  Administered 2022-07-21: 4 mL via INTRA_ARTICULAR

## 2022-07-21 NOTE — Progress Notes (Signed)
   [name] - 76 y.o. female MRN [id number]  Date of birth: 05-May-1946  Office Visit Note: Visit Date: 07/21/2022 PCP: Coral Spikes, DO Referred by: Coral Spikes, DO  Subjective: Chief Complaint  Patient presents with   Left Hip - Pain   HPI:  [name] is a 76 y.o. female who comes in today at the request of Dr. Eduard Roux and Tawanna Cooler, PA-C for work-in Left anesthetic hip arthrogram with fluoroscopic guidance.    Please see requesting physician notes for further details and justification.   ROS Otherwise per HPI.  Assessment & Plan: Visit Diagnoses:    ICD-10-CM   1. Primary osteoarthritis of left hip  M16.12 XR HIP UNILAT W OR W/O PELVIS 2-3 VIEWS LEFT      Plan: No additional findings.   Meds & Orders: No orders of the defined types were placed in this encounter.   Orders Placed This Encounter  Procedures   Large Joint Inj   XR HIP UNILAT W OR W/O PELVIS 2-3 VIEWS LEFT    Follow-up: Return if symptoms worsen or fail to improve, for visit to requesting provider as needed.   Procedures: Large Joint Inj: L hip joint on 07/21/2022 11:08 AM Indications: diagnostic evaluation and pain Details: 22 G 3.5 in needle, fluoroscopy-guided anterior approach  Arthrogram: No  Medications: 4 mL bupivacaine 0.25 %; 60 mg triamcinolone acetonide 40 MG/ML Outcome: tolerated well, no immediate complications  There was excellent flow of contrast producing a partial arthrogram of the hip. The patient did have relief of symptoms during the anesthetic phase of the injection. Procedure, treatment alternatives, risks and benefits explained, specific risks discussed. Consent was given by the patient. Immediately prior to procedure a time out was called to verify the correct patient, procedure, equipment, support staff and site/side marked as required. Patient was prepped and draped in the usual sterile fashion.          Clinical History: No specialty comments  available.     Objective:  VS:  HT:    WT:   BMI:     BP:   HR: bpm  TEMP: ( )  RESP:  Physical Exam   Imaging: XR HIP UNILAT W OR W/O PELVIS 2-3 VIEWS LEFT  Result Date: 07/21/2022 X-rays demonstrate advanced degenerative changes to the left hip

## 2022-07-21 NOTE — Progress Notes (Signed)
Office Visit Note   Patient: [name]           Date of Birth: 01-31-1946           MRN: [id number] Visit Date: 07/21/2022              Requested by: Coral Spikes, DO [place],  [place] [address] PCP: Coral Spikes, DO   Assessment & Plan: Visit Diagnoses:  1. Primary osteoarthritis of left hip     Plan: Impression is advanced generative joint disease left hip.  Today, we had a discussion on various treatment options to include intra-articular cortisone injection versus total hip arthroplasty.  She notes she is going to the beach this Saturday and would like to undergo cortisone injection if possible.  She will follow-up with [place] as needed.  Follow-Up Instructions: Return if symptoms worsen or fail to improve.   Orders:  Orders Placed This Encounter  Procedures   XR HIP UNILAT W OR W/O PELVIS 2-3 VIEWS LEFT   No orders of the defined types were placed in this encounter.     Procedures: No procedures performed   Clinical Data: No additional findings.   Subjective: Chief Complaint  Patient presents with   Left Hip - Pain    HPI patient is a pleasant 76 year old female who comes in today with left hip pain for several months.  She has noticed increasing pain over the past 6 months.  Majority of her pain is to the groin which is worse going from a seated to standing position as well as with stair climbing.  She has tried over-the-counter pain medication without relief.  No previous cortisone injection to left hip joint.  Review of Systems as detailed in HPI.  All others reviewed and are negative.   Objective: Vital Signs: There were no vitals taken for this visit.  Physical Exam well-developed well-nourished female no acute distress.  Alert and oriented x 3.  Ortho Exam left hip exam reveals marked pain with logroll and FADIR.  She is neurovascular tact distally.  Specialty Comments:  No specialty comments available.  Imaging: XR HIP  UNILAT W OR W/O PELVIS 2-3 VIEWS LEFT  Result Date: 07/21/2022 X-rays demonstrate advanced degenerative changes to the left hip    PMFS History: 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/02/2020   History of thyroid cancer 10/22/2020   Hypothyroidism 05/22/2016   Essential hypertension, benign 09/09/2013   Hyperlipidemia 09/09/2013   Past Medical History:  Diagnosis Date   Arthritis    Cancer ([place])    Thyroid cancer   COVID-19 virus infection 12/2020   GERD (gastroesophageal reflux disease)    History of kidney stones    Hypercholesteremia    resolved after gastric bypass   Hypertension    Hypothyroidism     Family History  Problem Relation Age of Onset   Breast cancer Mother    Pneumonia Mother 18   CAD Father 32   Breast cancer Sister    CAD Sister 32   Other Maternal Grandmother 19   CAD Brother 64   Other Brother 50       drug overdose   CAD Other 49   Colon cancer Neg Hx  Past Surgical History:  Procedure Laterality Date   ABDOMINAL HYSTERECTOMY     BILATERAL SALPINGOOPHORECTOMY     BREAST EXCISIONAL BIOPSY Bilateral 1995   benign   CHOLECYSTECTOMY     COLONOSCOPY  11/14/2011   Procedure: COLONOSCOPY;  Surgeon: Daneil Dolin, MD;  [facility];  Service: Endoscopy;  Laterality: N/A;   REPLACEMENT TOTAL KNEE BILATERAL     ROUX-EN-Y PROCEDURE  2007   TONSILLECTOMY     TOTAL HIP ARTHROPLASTY Right 12/23/2019   Procedure: RIGHT TOTAL HIP ARTHROPLASTY ANTERIOR APPROACH;  Surgeon: Leandrew Koyanagi, MD;  Location: [facility];  Service: Orthopedics;  Laterality: Right;   TOTAL THYROIDECTOMY  12/2010   TUBAL LIGATION     Social History   Occupational History   Occupation: Retired Therapist, art  Tobacco Use    Smoking status: Never   Smokeless tobacco: Never  Vaping Use   Vaping Use: Never used  Substance and Sexual Activity   Alcohol use: No   Drug use: No   Sexual activity: Not on file

## 2022-09-21 ENCOUNTER — Other Ambulatory Visit: Payer: Self-pay

## 2022-09-21 ENCOUNTER — Ambulatory Visit: Payer: Medicare HMO | Admitting: Orthopaedic Surgery

## 2022-09-21 DIAGNOSIS — M1612 Unilateral primary osteoarthritis, left hip: Secondary | ICD-10-CM | POA: Diagnosis not present

## 2022-09-21 NOTE — Progress Notes (Signed)
Office Visit Note   Patient: [name]           Date of Birth: Dec 13, 1945           MRN: [id number] Visit Date: 09/21/2022              Requested by: Coral Spikes, DO [place],  [place] [id number] PCP: Coral Spikes, DO   Assessment & Plan: Visit Diagnoses:  1. Primary osteoarthritis of left hip     Plan: Impression is end-stage left hip DJD.  She has bone-on-bone on x-rays.  She has had significant worsening symptoms.  Based on her options she has elected to move forward with scheduling for left total hip replacement.  She saw Dr. Lacinda Axon who is her PCP chest a few months ago for semiannual physical and everything was fine.  She will meet with Jackelyn Poling today to schedule surgery for sometime in November.  Follow-Up Instructions: No follow-ups on file.   Orders:  No orders of the defined types were placed in this encounter.  No orders of the defined types were placed in this encounter.     Procedures: No procedures performed   Clinical Data: No additional findings.   Subjective: Chief Complaint  Patient presents with   Left Hip - Pain    HPI Khadijah returns today for worsening left hip pain.  She has known advanced DJD.  We have been managing this conservatively for the last couple years.  She is now unable to walk without a cane and her hip is giving way and she is fearful of falling.  She has had cortisone injections in the past with temporary relief.  Her last injection was on 07/21/2022.  She did very well for her right hip replacement that we did in January 2021.  Review of Systems  Constitutional: Negative.   HENT: Negative.    Eyes: Negative.   Respiratory: Negative.    Cardiovascular: Negative.   Endocrine: Negative.   Musculoskeletal: Negative.   Neurological: Negative.   Hematological: Negative.   Psychiatric/Behavioral: Negative.    All other systems reviewed and are negative.    Objective: Vital Signs: There were no vitals  taken for this visit.  Physical Exam Vitals and nursing note reviewed.  Constitutional:      Appearance: She is well-developed.  HENT:     Head: Normocephalic and atraumatic.  Pulmonary:     Effort: Pulmonary effort is normal.  Abdominal:     Palpations: Abdomen is soft.  Musculoskeletal:     Cervical back: Neck supple.  Skin:    General: Skin is warm.     Capillary Refill: Capillary refill takes less than 2 seconds.  Neurological:     Mental Status: She is alert and oriented to person, place, and time.  Psychiatric:        Behavior: Behavior normal.        Thought Content: Thought content normal.        Judgment: Judgment normal.     Ortho Exam Examination left hip shows normal-appearing skin.  There is no previous scars.  No rashes.  She has pain with hip range of motion which is limited as well.  Pain with logroll and circumduction.  Positive Stinchfield sign. Specialty Comments:  No specialty comments available.  Imaging: No results found.   PMFS History: Patient Active Problem List   Diagnosis Date Noted   GERD (gastroesophageal reflux disease) 05/11/2022   Urge incontinence 02/21/2022  B12 deficiency 11/10/2021   Vitamin D deficiency 11/10/2021   Primary osteoarthritis of left hip 12/22/2020   Status post total hip replacement, right 12/22/2020   History of gastric bypass 11/02/2020   History of thyroid cancer 10/22/2020   Hypothyroidism 05/22/2016   Essential hypertension, benign 09/09/2013   Hyperlipidemia 09/09/2013   Past Medical History:  Diagnosis Date   Arthritis    Cancer ([place])    Thyroid cancer   COVID-19 virus infection 12/2020   GERD (gastroesophageal reflux disease)    History of kidney stones    Hypercholesteremia    resolved after gastric bypass   Hypertension    Hypothyroidism     Family History  Problem Relation Age of Onset   Breast cancer Mother    Pneumonia Mother 68   CAD Father 62   Breast cancer Sister    CAD Sister 79    Other Maternal Grandmother 2   CAD Brother 3   Other Brother 27       drug overdose   CAD Other 32   Colon cancer Neg Hx     Past Surgical History:  Procedure Laterality Date   ABDOMINAL HYSTERECTOMY     BILATERAL SALPINGOOPHORECTOMY     BREAST EXCISIONAL BIOPSY Bilateral 1995   benign   CHOLECYSTECTOMY     COLONOSCOPY  11/14/2011   Procedure: COLONOSCOPY;  Surgeon: Daneil Dolin, MD;  [facility];  Service: Endoscopy;  Laterality: N/A;   REPLACEMENT TOTAL KNEE BILATERAL     ROUX-EN-Y PROCEDURE  2007   TONSILLECTOMY     TOTAL HIP ARTHROPLASTY Right 12/23/2019   Procedure: RIGHT TOTAL HIP ARTHROPLASTY ANTERIOR APPROACH;  Surgeon: Leandrew Koyanagi, MD;  Location: [facility];  Service: Orthopedics;  Laterality: Right;   TOTAL THYROIDECTOMY  12/2010   TUBAL LIGATION     Social History   Occupational History   Occupation: Retired Therapist, art  Tobacco Use   Smoking status: Never   Smokeless tobacco: Never  Vaping Use   Vaping Use: Never used  Substance and Sexual Activity   Alcohol use: No   Drug use: No   Sexual activity: Not on file

## 2022-09-28 DIAGNOSIS — H5203 Hypermetropia, bilateral: Secondary | ICD-10-CM | POA: Diagnosis not present

## 2022-09-28 DIAGNOSIS — H524 Presbyopia: Secondary | ICD-10-CM | POA: Diagnosis not present

## 2022-09-28 DIAGNOSIS — H40013 Open angle with borderline findings, low risk, bilateral: Secondary | ICD-10-CM | POA: Diagnosis not present

## 2022-09-28 DIAGNOSIS — H259 Unspecified age-related cataract: Secondary | ICD-10-CM | POA: Diagnosis not present

## 2022-09-28 DIAGNOSIS — H04123 Dry eye syndrome of bilateral lacrimal glands: Secondary | ICD-10-CM | POA: Diagnosis not present

## 2022-09-28 DIAGNOSIS — H18593 Other hereditary corneal dystrophies, bilateral: Secondary | ICD-10-CM | POA: Diagnosis not present

## 2022-09-28 DIAGNOSIS — Z135 Encounter for screening for eye and ear disorders: Secondary | ICD-10-CM | POA: Diagnosis not present

## 2022-10-08 ENCOUNTER — Ambulatory Visit (INDEPENDENT_AMBULATORY_CARE_PROVIDER_SITE_OTHER): Payer: Medicare HMO | Admitting: *Deleted

## 2022-10-08 DIAGNOSIS — Z23 Encounter for immunization: Secondary | ICD-10-CM | POA: Diagnosis not present

## 2022-10-26 NOTE — Progress Notes (Addendum)
Surgical Instructions    Your procedure is scheduled on Monday November 27th.  Report to [facility] Main Entrance "A" at 8:00 A.M., then check in with the Admitting office.  Call this number if you have problems the morning of surgery:  [phone]   If you have any questions prior to your surgery date call [phone]: Open Monday-Friday 8am-4pm If you experience any cold or flu symptoms such as cough, fever, chills, shortness of breath, etc. between now and your scheduled surgery, please notify us at the above number     Remember:  Do not eat after midnight the night before your surgery  You may drink clear liquids until 7:30am the morning of your surgery.   Clear liquids allowed are: Water, Non-Citrus Juices (without pulp), Carbonated Beverages, Clear Tea, Black Coffee ONLY (NO MILK, CREAM OR POWDERED CREAMER of any kind), and Gatorade   Enhanced Recovery after Surgery for Orthopedics Enhanced Recovery after Surgery is a protocol used to improve the stress on your body and your recovery after surgery.  Patient Instructions  The day of surgery (if you do NOT have diabetes):  Drink ONE (1) Pre-Surgery Clear Ensure by ___730__ am the morning of surgery   This drink was given to you during your hospital  pre-op appointment visit. Nothing else to drink after completing the  Pre-Surgery Clear Ensure.          If you have questions, please contact your surgeon's office.     Take these medicines the morning of surgery with A SIP OF WATER: levothyroxine (SYNTHROID) 150 MCG tablet    IF NEEDED  acetaminophen (TYLENOL) 500 MG tablet  carboxymethylcellulose (REFRESH PLUS) 0.5 % SOLN  valACYclovir (VALTREX) 1000 MG tablet    As of today, STOP taking any Aspirin (unless otherwise instructed by your surgeon) Aleve, Naproxen, Ibuprofen, Motrin, Advil, Goody's, BC's, all herbal medications, fish oil, and all vitamins.           Do not wear jewelry or makeup. Do not wear lotions,  powders, perfumes or deodorant. Do not shave 48 hours prior to surgery.   Do not bring valuables to the hospital. Do not wear nail polish, gel polish, artificial nails, or any other type of covering on natural nails (fingers and toes) If you have artificial nails or gel coating that need to be removed by a nail salon, please have this removed prior to surgery. Artificial nails or gel coating may interfere with anesthesia's ability to adequately monitor your vital signs.  [facility] is not responsible for any belongings or valuables.    Do NOT Smoke (Tobacco/Vaping)  24 hours prior to your procedure  If you use a CPAP at night, you may bring your mask for your overnight stay.   Contacts, glasses, hearing aids, dentures or partials may not be worn into surgery, please bring cases for these belongings   For patients admitted to the hospital, discharge time will be determined by your treatment team.   Patients discharged the day of surgery will not be allowed to drive home, and someone needs to stay with them for 24 hours.   SURGICAL WAITING ROOM VISITATION Patients having surgery or a procedure may have no more than 2 support people in the waiting area - these visitors may rotate.   Children under the age of 11 must have an adult with them who is not the patient. If the patient needs to stay at the hospital during part of their recovery, the visitor guidelines for  inpatient rooms apply. Pre-op nurse will coordinate an appropriate time for 1 support person to accompany patient in pre-op.  This support person may not rotate.   Please refer to [email or web address].hu for the visitor guidelines for Inpatients (after your surgery is over and you are in a regular room).    Special instructions:    Oral Hygiene is also important to reduce your risk of infection.  Remember - BRUSH YOUR TEETH THE MORNING OF SURGERY WITH YOUR REGULAR  TOOTHPASTE   [facility]- Preparing For Surgery  Before surgery, you can play an important role. Because skin is not sterile, your skin needs to be as free of germs as possible. You can reduce the number of germs on your skin by washing with CHG (chlorahexidine gluconate) Soap before surgery.  CHG is an antiseptic cleaner which kills germs and bonds with the skin to continue killing germs even after washing.     Please do not use if you have an allergy to CHG or antibacterial soaps. If your skin becomes reddened/irritated stop using the CHG.  Do not shave (including legs and underarms) for at least 48 hours prior to first CHG shower. It is OK to shave your face.  Please follow these instructions carefully.     Shower the NIGHT BEFORE SURGERY and the MORNING OF SURGERY with CHG Soap.   If you chose to wash your hair, wash your hair first as usual with your normal shampoo. After you shampoo, rinse your hair and body thoroughly to remove the shampoo.  Then [employer] and genitals (private parts) with your normal soap and rinse thoroughly to remove soap.  After that Use CHG Soap as you would any other liquid soap. You can apply CHG directly to the skin and wash gently with a scrungie or a clean washcloth.   Apply the CHG Soap to your body ONLY FROM THE NECK DOWN.  Do not use on open wounds or open sores. Avoid contact with your eyes, ears, mouth and genitals (private parts). Wash Face and genitals (private parts)  with your normal soap.   Wash thoroughly, paying special attention to the area where your surgery will be performed.  Thoroughly rinse your body with warm water from the neck down.  DO NOT shower/wash with your normal soap after using and rinsing off the CHG Soap.  Pat yourself dry with a CLEAN TOWEL.  Wear CLEAN PAJAMAS to bed the night before surgery  Place CLEAN SHEETS on your bed the night before your surgery  DO NOT SLEEP WITH PETS.   Day of Surgery:  Take a shower  with CHG soap. Wear Clean/Comfortable clothing the morning of surgery Do not apply any deodorants/lotions.   Remember to brush your teeth WITH YOUR REGULAR TOOTHPASTE.    If you received a COVID test during your pre-op visit, it is requested that you wear a mask when out in public, stay away from anyone that may not be feeling well, and notify your surgeon if you develop symptoms. If you have been in contact with anyone that has tested positive in the last 10 days, please notify your surgeon.    Please read over the following fact sheets that you were given.

## 2022-10-27 ENCOUNTER — Encounter (HOSPITAL_COMMUNITY): Payer: Self-pay

## 2022-10-27 ENCOUNTER — Other Ambulatory Visit: Payer: Self-pay | Admitting: Family Medicine

## 2022-10-27 ENCOUNTER — Encounter (HOSPITAL_COMMUNITY): Payer: Self-pay | Admitting: Vascular Surgery

## 2022-10-27 ENCOUNTER — Encounter (HOSPITAL_COMMUNITY): Payer: Self-pay | Admitting: Physician Assistant

## 2022-10-27 ENCOUNTER — Encounter (HOSPITAL_COMMUNITY)
Admission: RE | Admit: 2022-10-27 | Discharge: 2022-10-27 | Disposition: A | Discharge status: Home / Self Care | Payer: Medicare HMO | Source: Ambulatory Visit | Attending: Orthopaedic Surgery | Admitting: Orthopaedic Surgery

## 2022-10-27 ENCOUNTER — Other Ambulatory Visit: Payer: Self-pay

## 2022-10-27 VITALS — BP 141/73 | HR 57 | Temp 98.1°F | Resp 18 | Ht 67.0 in | Wt 173.9 lb

## 2022-10-27 DIAGNOSIS — R011 Cardiac murmur, unspecified: Secondary | ICD-10-CM

## 2022-10-27 DIAGNOSIS — R600 Localized edema: Secondary | ICD-10-CM | POA: Diagnosis not present

## 2022-10-27 DIAGNOSIS — E039 Hypothyroidism, unspecified: Secondary | ICD-10-CM | POA: Insufficient documentation

## 2022-10-27 DIAGNOSIS — M25552 Pain in left hip: Secondary | ICD-10-CM | POA: Insufficient documentation

## 2022-10-27 DIAGNOSIS — M1612 Unilateral primary osteoarthritis, left hip: Secondary | ICD-10-CM | POA: Insufficient documentation

## 2022-10-27 DIAGNOSIS — K219 Gastro-esophageal reflux disease without esophagitis: Secondary | ICD-10-CM | POA: Diagnosis not present

## 2022-10-27 DIAGNOSIS — E785 Hyperlipidemia, unspecified: Secondary | ICD-10-CM | POA: Diagnosis not present

## 2022-10-27 DIAGNOSIS — I10 Essential (primary) hypertension: Secondary | ICD-10-CM | POA: Diagnosis not present

## 2022-10-27 DIAGNOSIS — Z8249 Family history of ischemic heart disease and other diseases of the circulatory system: Secondary | ICD-10-CM | POA: Insufficient documentation

## 2022-10-27 DIAGNOSIS — Z01818 Encounter for other preprocedural examination: Secondary | ICD-10-CM | POA: Diagnosis not present

## 2022-10-27 DIAGNOSIS — Z8616 Personal history of COVID-19: Secondary | ICD-10-CM | POA: Insufficient documentation

## 2022-10-27 HISTORY — DX: Family history of other specified conditions: Z84.89

## 2022-10-27 HISTORY — DX: Cardiac murmur, unspecified: R01.1

## 2022-10-27 LAB — TYPE AND SCREEN
ABO/RH(D): O POS
Antibody Screen: NEGATIVE

## 2022-10-27 LAB — CBC
HCT: 36.8 % (ref 36.0–46.0)
Hemoglobin: 11.9 g/dL — ABNORMAL LOW (ref 12.0–15.0)
MCH: 30.1 pg (ref 26.0–34.0)
MCHC: 32.3 g/dL (ref 30.0–36.0)
MCV: 93.2 fL (ref 80.0–100.0)
Platelets: 203 10*3/uL (ref 150–400)
RBC: 3.95 MIL/uL (ref 3.87–5.11)
RDW: 14.5 % (ref 11.5–15.5)
WBC: 5.8 10*3/uL (ref 4.0–10.5)
nRBC: 0 % (ref 0.0–0.2)

## 2022-10-27 LAB — BASIC METABOLIC PANEL
Anion gap: 6 (ref 5–15)
BUN: 14 mg/dL (ref 8–23)
CO2: 22 mmol/L (ref 22–32)
Calcium: 8.7 mg/dL — ABNORMAL LOW (ref 8.9–10.3)
Chloride: 114 mmol/L — ABNORMAL HIGH (ref 98–111)
Creatinine, Ser: 0.88 mg/dL (ref 0.44–1.00)
GFR, Estimated: >60 mL/min (ref >60)
Glucose, Bld: 83 mg/dL (ref 70–99)
Potassium: 3.7 mmol/L (ref 3.5–5.1)
Sodium: 142 mmol/L (ref 135–145)

## 2022-10-27 LAB — SURGICAL PCR SCREEN
MRSA, PCR: NEGATIVE
Staphylococcus aureus: NEGATIVE

## 2022-10-27 NOTE — Anesthesia Preprocedure Evaluation (Deleted)

## 2022-10-27 NOTE — Progress Notes (Signed)
PCP - Thersa Salt Cardiologist - no current cardiologist  PPM/ICD - denies  Chest x-ray - N/A EKG - 10/27/2022  Stress Test - 12/29/16 ECHO - 12/28/16 Cardiac Cath - denies  Sleep Study - denies   Fasting Blood Sugar - N/A   Blood Thinner Instructions: N/A Aspirin Instructions:N/A  ERAS Protcol -ERAS + ensure PRE-SURGERY Ensure or G2-   COVID TEST-    Anesthesia review: pt with new heart murmur per last PCP visit. Pt denies cardiac symptoms. Donovan Kail, PA spoke with pt at PAT.   Patient denies shortness of breath, fever, cough and chest pain at PAT appointment   All instructions explained to the patient, with a verbal understanding of the material. Patient agrees to go over the instructions while at home for a better understanding. The opportunity to ask questions was provided.

## 2022-10-27 NOTE — Progress Notes (Addendum)
Anesthesia APP Evaluation:  Case: [id number] Date/Time: 11/07/22 1015   Procedure: LEFT TOTAL HIP ARTHROPLASTY ANTERIOR APPROACH (Left: Hip) - 3-C   Anesthesia type: Spinal   Pre-op diagnosis: left hip degenerative joint disease   Location: [facility] OR ROOM 06 / [place]   Surgeons: Leandrew Koyanagi, MD       DISCUSSION: Patient is a 76 year old female scheduled for the above procedure.  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   She was evaluated at her PAT visit due to history of murmur with last echo > 5 years ago. She had mild AI in 12/2016. She denied chest pain or SOB. She reports some mild chronic lower extremity edema which is worse at the end of the day. She does not do routine aerobic exercise.  She is able to do her day to day activities, but is more limited now due to left hip pain and is using a cane. She has a cleaning lady and has not had to use stairs recently. She denied syncope or significant palpitations. Her father died at at 34 during open heart surgery for aortic surgery (is unsure if issues was AS, AI, or TAA). Her brother died at age 18 from a MI. Other than murmur history, she has no known personal CAD, CHF, or arrhythmia history.  Exam as below.  Given prominent murmur and pending left THA would recommend preoperative echo. Unless significant findings on echo or change in symptomology, will defer decision for cardiology referral to her PCP Coral Spikes, DO. Anesthesiologist Nolon Nations, MD is in agreement. I have reached out to Dr. Lacinda Axon, and he will order the echo. Of note, she actually has routine follow-up scheduled with Dr. Lacinda Axon on 11/01/22.   Chart will be  left for follow-up. I have notified Debbie at Dr. Phoebe Sharps office. ([place] 10/28/22 5:58 PM: Echo was ordered for [facility]. I called there earlier today in attempts to expedite getting echo scheduled. I was transferred to [facility] and spoke with their scheduling staff and was told they plan to follow-up with authorization and the patient.)   ADDENDUM 11/04/22 10:27 AM: 11/02/22 echo results reviewed. TEE recommended. Dr. Lacinda Axon has spoken with me and the patient. Surgery to be postponed. She and Dr. Erlinda Hong are aware. She tells me she is being urgently referred to cardiology for the TEE.    VS: BP (!) 141/73   Pulse (!) 57   Temp 36.7 C (Oral)   Resp 18   Ht '5\' 7"'$  (1.702 m)   Wt 78.9 kg   SpO2 100%   BMI 27.24 kg/m  Heart overall RRR, occasional ectopic beat. III/VI murmur, loudest at LSB. Lung clear. No carotid bruits noted.    PROVIDERS: Coral Spikes, DO is PCP  Ilona Sorrel, MD is endocrinologist ([facility]) She is not followed routinely by cardiology but had an evaluation for chest pain in 2018 by Carlyle Dolly, MD. She had a low risk stress test. Echo showed LVEF 65-70%, mild AI. As needed cardiology follow-up recommended at 01/13/17 visit.    LABS: Labs reviewed: Acceptable for surgery. (all labs ordered are listed, but only abnormal results are displayed)  Labs Reviewed  CBC - Abnormal; Notable for the following components:      Result Value  Hemoglobin 11.9 (*)    All other components within normal limits  BASIC METABOLIC PANEL - Abnormal; Notable for the following components:   Chloride 114 (*)    Calcium 8.7 (*)    All other components within normal limits  SURGICAL PCR SCREEN  TYPE AND SCREEN    EKG: 10/27/22:  Sinus rhythm with Premature atrial complexes Minimal voltage criteria for LVH, may be normal variant ( Sokolow-Lyon ) Borderline ECG - PACs new when compared to 12/19/19 tracing.   CV: Nuclear stress test 12/29/16: There was no ST segment deviation noted during  stress. The study is normal. Inferior defect most consistent with subdiaphragmatic attenuation and gut radiotracer uptake artifact as opposed to true inferior wall ischemia. This is a low risk study. The left ventricular ejection fraction is normal (55-65%).   Echo 12/28/16: Study Conclusions  - Left ventricle: The cavity size was normal. Wall thickness was    increased in a pattern of mild LVH. Systolic function was    vigorous. The estimated ejection fraction was in the range of 65%    to 70%. Doppler parameters are consistent with abnormal left    ventricular relaxation (grade 1 diastolic dysfunction).  - Aortic valve: Mildly calcified annulus. Trileaflet; mildly    thickened leaflets. There was mild regurgitation. Valve area    (VTI): 2.3 cm^2. Valve area (Vmax): 2.16 cm^2. Valve area    (Vmean): 2.12 cm^2.  - Left atrium: The atrium was mildly dilated.  - Technically adequate study.    Past Medical History:  Diagnosis Date   Arthritis    Cancer [facility])    Thyroid cancer   COVID-19 virus infection 12/2020   Family history of adverse reaction to anesthesia    mother "couldn't wake up good" and would throw up   GERD (gastroesophageal reflux disease)    Heart murmur    History of kidney stones    Hypercholesteremia    resolved after gastric bypass   Hypertension    Hypothyroidism     Past Surgical History:  Procedure Laterality Date   ABDOMINAL HYSTERECTOMY     BILATERAL SALPINGOOPHORECTOMY     BREAST EXCISIONAL BIOPSY Bilateral 1995   benign   CHOLECYSTECTOMY     COLONOSCOPY  11/14/2011   Procedure: COLONOSCOPY;  Surgeon: Daneil Dolin, MD;  [facility];  Service: Endoscopy;  Laterality: N/A;   REPLACEMENT TOTAL KNEE BILATERAL     ROUX-EN-Y PROCEDURE  2007   TONSILLECTOMY     TOTAL HIP ARTHROPLASTY Right 12/23/2019   Procedure: RIGHT TOTAL HIP ARTHROPLASTY ANTERIOR APPROACH;  Surgeon: Leandrew Koyanagi, MD;  Location: [place];  Service: Orthopedics;  Laterality:  Right;   TOTAL THYROIDECTOMY  12/2010   TUBAL LIGATION      MEDICATIONS:  acetaminophen (TYLENOL) 500 MG tablet   amoxicillin (AMOXIL) 500 MG capsule   Calcium Carb-Cholecalciferol (OYSTER SHELL CALCIUM/D3 PO)   carboxymethylcellulose (REFRESH PLUS) 0.5 % SOLN   enalapril (VASOTEC) 10 MG tablet   famotidine (PEPCID) 40 MG tablet   ferrous sulfate 325 (65 FE) MG tablet   levothyroxine (SYNTHROID) 150 MCG tablet   MAGNESIUM PO   Multiple Vitamin (MULTIVITAMIN WITH MINERALS) TABS tablet   Multiple Vitamins-Minerals (ZINC PO)   oxybutynin (DITROPAN-XL) 5 MG 24 hr tablet   valACYclovir (VALTREX) 1000 MG tablet   vitamin B-12 (CYANOCOBALAMIN) 500 MCG tablet   VITAMIN D PO   No current facility-administered medications for this encounter.    Myra Gianotti, PA-C Surgical Short Stay/Anesthesiology  [facility] Phone [phone] [facility] Phone [phone] 10/27/2022 5:43 PM

## 2022-10-28 ENCOUNTER — Other Ambulatory Visit: Payer: Self-pay | Admitting: Physician Assistant

## 2022-10-28 MED ORDER — METHOCARBAMOL 750 MG PO TABS: 750.0000 mg | ORAL_TABLET | Freq: Two times a day (BID) | ORAL | 2 refills | Status: DC | PRN

## 2022-10-28 MED ORDER — ASPIRIN 81 MG PO TBEC: 81.0000 mg | DELAYED_RELEASE_TABLET | Freq: Two times a day (BID) | ORAL | 0 refills | Status: DC

## 2022-10-28 MED ORDER — DOCUSATE SODIUM 100 MG PO CAPS: 100.0000 mg | ORAL_CAPSULE | Freq: Every day | ORAL | 2 refills | Status: DC | PRN

## 2022-10-28 MED ORDER — ONDANSETRON HCL 4 MG PO TABS: 4.0000 mg | ORAL_TABLET | Freq: Three times a day (TID) | ORAL | 0 refills | Status: DC | PRN

## 2022-10-28 MED ORDER — OXYCODONE-ACETAMINOPHEN 5-325 MG PO TABS: 1.0000 | ORAL_TABLET | Freq: Three times a day (TID) | ORAL | 0 refills | Status: DC | PRN

## 2022-11-01 ENCOUNTER — Ambulatory Visit (INDEPENDENT_AMBULATORY_CARE_PROVIDER_SITE_OTHER): Payer: Medicare HMO | Admitting: Family Medicine

## 2022-11-01 DIAGNOSIS — E890 Postprocedural hypothyroidism: Secondary | ICD-10-CM | POA: Diagnosis not present

## 2022-11-01 DIAGNOSIS — R011 Cardiac murmur, unspecified: Secondary | ICD-10-CM | POA: Diagnosis not present

## 2022-11-01 DIAGNOSIS — N3941 Urge incontinence: Secondary | ICD-10-CM

## 2022-11-01 DIAGNOSIS — I10 Essential (primary) hypertension: Secondary | ICD-10-CM

## 2022-11-01 NOTE — Assessment & Plan Note (Signed)
3/6 systolic murmur. Echo scheduled.

## 2022-11-01 NOTE — Progress Notes (Signed)
Subjective:  Patient ID: [name], female    DOB: April 06, 1946  Age: 76 y.o. MRN: [id number]  CC: Chief Complaint  Patient presents with   Follow-up    Patient wants to discuss Oxybutynin chloride.    HPI:  76 year old female with hypertension, GERD, hypothyroidism, osteoarthritis of the left hip with upcoming surgery, hyperlipidemia presents for follow-up.  Patient is preparing for upcoming orthopedic surgery.  She has her echocardiogram scheduled.  This is to evaluate murmur.  She had some aortic calcification on the last echocardiogram that was done in 2018.  Concern for aortic stenosis.  Patient's blood pressure is elevated today and on repeat.  She endorses compliance with enalapril.  She states that her blood pressures are well-controlled at home.  Hypothyroidism is managed by endocrinology.  Last TSH was suppressed.  Patient reports that oxybutynin has not helped her urge incontinence.  She states that she has had a family member who responded well to Botox injections.  She would like to see urology.  Will place referral today.  Patient Active Problem List   Diagnosis Date Noted   Murmur, cardiac 11/01/2022   GERD (gastroesophageal reflux disease) 05/11/2022   Urge incontinence 02/21/2022   B12 deficiency 11/10/2021   Vitamin D deficiency 11/10/2021   Primary osteoarthritis of left hip 12/22/2020   Status post total hip replacement, right 12/22/2020   History of gastric bypass 11/02/2020   History of thyroid cancer 10/22/2020   Hypothyroidism 05/22/2016   Essential hypertension, benign 09/09/2013   Hyperlipidemia 09/09/2013    Social Hx   Social History   Socioeconomic History   Marital status: Widowed    Spouse name: Not on file   Number of children: Not on file   Years of education: Not on file   Highest education level: Not on file  Occupational History   Occupation: Retired Therapist, art  Tobacco Use   Smoking status: Never   Smokeless  tobacco: Never  Vaping Use   Vaping Use: Never used  Substance and Sexual Activity   Alcohol use: No   Drug use: No   Sexual activity: Not on file  Other Topics Concern   Not on file  Social History Narrative   Not on file   Social Determinants of Health   Financial Resource Strain: Low Risk  (02/15/2022)   Overall Financial Resource Strain (CARDIA)    Difficulty of Paying Living Expenses: Not very hard  Food Insecurity: No Food Insecurity (02/15/2022)   Hunger Vital Sign    Worried About Running Out of Food in the Last Year: Never true    Ran Out of Food in the Last Year: Never true  Transportation Needs: No Transportation Needs (02/15/2022)   PRAPARE - Hydrologist (Medical): No    Lack of Transportation (Non-Medical): No  Physical Activity: Insufficiently Active (02/15/2022)   Exercise Vital Sign    Days of Exercise per Week: 1 day    Minutes of Exercise per Session: 10 min  Stress: No Stress Concern Present (02/15/2022)   [place]    Feeling of Stress : Not at all  Social Connections: Moderately Integrated (02/15/2022)   Social Connection and Isolation Panel [NHANES]    Frequency of Communication with Friends and Family: More than three times a week    Frequency of Social Gatherings with Friends and Family: Three times a week    Attends Religious Services: More than 4 times  per year    Active Member of Clubs or Organizations: Yes    Attends Archivist Meetings: More than 4 times per year    Marital Status: Widowed    Review of Systems Per HPI  Objective:  BP (!) 146/90   Pulse 63   Temp 97.6 F (36.4 C) (Oral)   Ht '5\' 7"'$  (1.702 m)   Wt 172 lb 12.8 oz (78.4 kg)   SpO2 98%   BMI 27.06 kg/m      11/01/2022   10:01 AM 11/01/2022    9:39 AM 11/01/2022    9:28 AM  BP/Weight  Systolic BP 700 174 944  Diastolic BP 90 90 90  Wt. (Lbs)   172.8  BMI   27.06 kg/m2     Physical Exam Vitals and nursing note reviewed.  Constitutional:      General: She is not in acute distress.    Appearance: Normal appearance.  HENT:     Head: Normocephalic and atraumatic.  Eyes:     General:        Right eye: No discharge.        Left eye: No discharge.     Conjunctiva/sclera: Conjunctivae normal.  Cardiovascular:     Rate and Rhythm: Normal rate and regular rhythm.     Comments: 3/6 systolic murmur. Pulmonary:     Effort: Pulmonary effort is normal.     Breath sounds: Normal breath sounds. No wheezing or rales.  Neurological:     Mental Status: She is alert.  Psychiatric:        Mood and Affect: Mood normal.        Behavior: Behavior normal.     Lab Results  Component Value Date   WBC 5.8 10/27/2022   HGB 11.9 (L) 10/27/2022   HCT 36.8 10/27/2022   PLT 203 10/27/2022   GLUCOSE 83 10/27/2022   CHOL 180 01/11/2022   TRIG 119 01/11/2022   HDL 57 01/11/2022   LDLCALC 102 (H) 01/11/2022   ALT 18 01/11/2022   AST 19 01/11/2022   NA 142 10/27/2022   K 3.7 10/27/2022   CL 114 (H) 10/27/2022   CREATININE 0.88 10/27/2022   BUN 14 10/27/2022   CO2 22 10/27/2022   TSH 0.979 06/30/2017   INR 1.0 12/19/2019     Assessment & Plan:   Problem List Items Addressed This Visit       Cardiovascular and Mediastinum   Essential hypertension, benign    BP elevated today.  Advised to monitor her blood pressure closely at home.  Given upcoming surgery I do not want to increase her ACE inhibitor unless I have to.  Continue enalapril.        Endocrine   Hypothyroidism    Continue follow-up with endocrinology.        Other   Murmur, cardiac    3/6 systolic murmur. Echo scheduled.      Urge incontinence    Uncontrolled.  Stopping oxybutynin.  Referring to [facility].      Relevant Orders   Ambulatory referral to Urology    Follow-up:  Return in about 3 months (around 02/01/2023).  [place]

## 2022-11-01 NOTE — Patient Instructions (Signed)
Keep an eye on your BP.  We will call about Echo results.  Follow up in 3 months.

## 2022-11-01 NOTE — Assessment & Plan Note (Signed)
Continue follow up with endocrinology.

## 2022-11-01 NOTE — Assessment & Plan Note (Signed)
Uncontrolled.  Stopping oxybutynin.  Referring to [facility].

## 2022-11-01 NOTE — Assessment & Plan Note (Addendum)
BP elevated today.  Advised to monitor her blood pressure closely at home.  Given upcoming surgery I do not want to increase her ACE inhibitor unless I have to.  Continue enalapril.

## 2022-11-02 ENCOUNTER — Ambulatory Visit: Payer: Medicare HMO | Attending: Family Medicine

## 2022-11-02 DIAGNOSIS — R011 Cardiac murmur, unspecified: Secondary | ICD-10-CM | POA: Diagnosis not present

## 2022-11-02 LAB — ECHOCARDIOGRAM COMPLETE
AR max vel: 2.52 cm2
AV Area VTI: 2.26 cm2
AV Area mean vel: 2.73 cm2
AV Mean grad: 5 mmHg
AV Peak grad: 11.4 mmHg
Ao pk vel: 1.69 m/s
Area-P 1/2: 3.34 cm2
Calc EF: 62.5 %
MV M vel: 4.95 m/s
MV Peak grad: 97.9 mmHg
S' Lateral: 3.2 cm
Single Plane A2C EF: 61.9 %
Single Plane A4C EF: 63.2 %

## 2022-11-04 ENCOUNTER — Other Ambulatory Visit: Payer: Self-pay | Admitting: Family Medicine

## 2022-11-04 DIAGNOSIS — R931 Abnormal findings on diagnostic imaging of heart and coronary circulation: Secondary | ICD-10-CM

## 2022-11-04 MED ORDER — TRANEXAMIC ACID 1000 MG/10ML IV SOLN
2000.0000 mg | INTRAVENOUS | Status: DC
  Filled 2022-11-04 (×2): qty 20

## 2022-11-07 ENCOUNTER — Ambulatory Visit (HOSPITAL_COMMUNITY): Admission: RE | Admit: 2022-11-07 | Payer: Medicare HMO | Source: Ambulatory Visit | Admitting: Orthopaedic Surgery

## 2022-11-07 ENCOUNTER — Other Ambulatory Visit: Payer: Self-pay | Admitting: *Deleted

## 2022-11-07 ENCOUNTER — Encounter (HOSPITAL_COMMUNITY): Admission: RE | Payer: Self-pay | Source: Ambulatory Visit

## 2022-11-07 DIAGNOSIS — R011 Cardiac murmur, unspecified: Secondary | ICD-10-CM

## 2022-11-07 DIAGNOSIS — R931 Abnormal findings on diagnostic imaging of heart and coronary circulation: Secondary | ICD-10-CM

## 2022-11-07 DIAGNOSIS — M1612 Unilateral primary osteoarthritis, left hip: Secondary | ICD-10-CM

## 2022-11-07 SURGERY — ARTHROPLASTY, HIP, TOTAL, ANTERIOR APPROACH
Anesthesia: Spinal | Site: Hip | Laterality: Left

## 2022-11-08 ENCOUNTER — Ambulatory Visit: Payer: Medicare HMO | Attending: Internal Medicine | Admitting: Internal Medicine

## 2022-11-08 ENCOUNTER — Encounter: Payer: Self-pay | Admitting: Internal Medicine

## 2022-11-08 VITALS — BP 140/94 | HR 66 | Ht 67.0 in | Wt 170.6 lb

## 2022-11-08 DIAGNOSIS — I340 Nonrheumatic mitral (valve) insufficiency: Secondary | ICD-10-CM | POA: Insufficient documentation

## 2022-11-08 NOTE — Patient Instructions (Addendum)
Medication Instructions:  Your physician recommends that you continue on your current medications as directed. Please refer to the Current Medication list given to you today.  *If you need a refill on your cardiac medications before your next appointment, please call your pharmacy*   Lab Work:  NONE   If you have labs (blood work) drawn today and your tests are completely normal, you will receive your results only by: [place] (if you have MyChart) OR A paper copy in the mail If you have any lab test that is abnormal or we need to change your treatment, we will call you to review the results.   Testing/Procedures: Your physician has requested that you have a TEE. During a TEE, sound waves are used to create images of your heart. It provides your doctor with information about the size and shape of your heart and how well your heart's chambers and valves are working. In this test, a transducer is attached to the end of a flexible tube that's guided down your throat and into your esophagus (the tube leading from you mouth to your stomach) to get a more detailed image of your heart. You are not awake for the procedure. Please see the instruction sheet given to you today. For further information please visit [email or web address].   Follow-Up: At [facility], you and your health needs are our priority.  As part of our continuing mission to provide you with exceptional heart care, we have created designated Provider Care Teams.  These Care Teams include your primary Cardiologist (physician) and Advanced Practice Providers (APPs -  Physician Assistants and Nurse Practitioners) who all work together to provide you with the care you need, when you need it.  We recommend signing up for the patient portal called "MyChart".  Sign up information is provided on this After Visit Summary.  MyChart is used to connect with patients for Virtual Visits (Telemedicine).  Patients are able to view  lab/test results, encounter notes, upcoming appointments, etc.  Non-urgent messages can be sent to your provider as well.   To learn more about what you can do with MyChart, go to [email or web address].    Your next appointment:   1 year(s)  The format for your next appointment:   In Person  Provider:   Claudina Lick, MD    Other Instructions Thank you for choosing [facility]!    Important Information About Sugar

## 2022-11-08 NOTE — H&P (View-Only) (Signed)
Cardiology Office Note  Date: 11/08/2022   [name], DOB 10-02-1946, MRN [id number]  PCP:  Coral Spikes, DO  Cardiologist:  None Electrophysiologist:  None   Reason for Office Visit: Abnormal echocardiogram   History of Present Illness: [name] is a 76 y.o. female known to have HTN, hypothyroidism presented to the cardiology clinic for abnormal echocardiogram. Echo was performed on 02/72/5366 due to systolic murmur and it showed LVEF 60 to 65% with grade 2 diastolic dysfunction and moderate MR, eccentric and anteriorly directed likely from anterior mitral valve leaflet pathology. TEE was recommended in the echo report and hence patient is here for the same. She is scheduled for left hip arthroplasty and is requesting for the TEE to be scheduled sooner so she can get the surgery prior to the response.  She denied any DOE, leg swelling, angina, lightness, dizziness, syncope. Denies smoking cigarettes, alcohol use or illicit drug abuse.  Past Medical History:  Diagnosis Date   Arthritis    Cancer [facility])    Thyroid cancer   COVID-19 virus infection 12/2020   Family history of adverse reaction to anesthesia    mother "couldn't wake up good" and would throw up   GERD (gastroesophageal reflux disease)    Heart murmur    History of kidney stones    Hypercholesteremia    resolved after gastric bypass   Hypertension    Hypothyroidism     Past Surgical History:  Procedure Laterality Date   ABDOMINAL HYSTERECTOMY     BILATERAL SALPINGOOPHORECTOMY     BREAST EXCISIONAL BIOPSY Bilateral 1995   benign   CHOLECYSTECTOMY     COLONOSCOPY  11/14/2011   Procedure: COLONOSCOPY;  Surgeon: Daneil Dolin, MD;  [facility];  Service: Endoscopy;  Laterality: N/A;   REPLACEMENT TOTAL KNEE BILATERAL     ROUX-EN-Y PROCEDURE  2007   TONSILLECTOMY     TOTAL HIP ARTHROPLASTY Right 12/23/2019   Procedure: RIGHT TOTAL HIP ARTHROPLASTY ANTERIOR APPROACH;  Surgeon: Leandrew Koyanagi, MD;  Location: [place];  Service: Orthopedics;  Laterality: Right;   TOTAL THYROIDECTOMY  12/2010   TUBAL LIGATION      Current Outpatient Medications  Medication Sig Dispense Refill   acetaminophen (TYLENOL) 500 MG tablet Take 1,000 mg by mouth 2 (two) times daily as needed (for pain.).     amoxicillin (AMOXIL) 500 MG capsule Take 2,000 mg by mouth See admin instructions. Take 4 capsules (2000 mg) by mouth 1 hour prior to dental appointments     aspirin EC 81 MG tablet Take 1 tablet (81 mg total) by mouth 2 (two) times daily. To be taken after surgery to prevent blood clots 84 tablet 0   Calcium Carb-Cholecalciferol (OYSTER SHELL CALCIUM/D3 PO) Take 1 tablet by mouth in the morning and at bedtime.     carboxymethylcellulose (REFRESH PLUS) 0.5 % SOLN Place 1 drop into both eyes in the morning and at bedtime.     docusate sodium (COLACE) 100 MG capsule Take 1 capsule (100 mg total) by mouth daily as needed. 30 capsule 2   enalapril (VASOTEC) 10 MG tablet Take 1 tablet (10 mg total) by mouth at bedtime. 90 tablet 1   famotidine (PEPCID) 40 MG tablet TAKE 1 TABLET DAILY (CANCELRANTITIDINE) (Patient taking differently: Take 40 mg by mouth at bedtime.) 90 tablet 1   ferrous sulfate 325 (65 FE) MG tablet Take 325 mg by mouth daily in the afternoon.  levothyroxine (SYNTHROID) 150 MCG tablet Take 1 tablet (150 mcg total) by mouth daily before breakfast. 30 tablet 0   MAGNESIUM PO Take 1 tablet by mouth daily as needed (leg/feet cramps).     methocarbamol (ROBAXIN-750) 750 MG tablet Take 1 tablet (750 mg total) by mouth 2 (two) times daily as needed for muscle spasms. 30 tablet 2   Multiple Vitamin (MULTIVITAMIN WITH MINERALS) TABS tablet Take 1 tablet by mouth in the morning and at bedtime.     Multiple Vitamins-Minerals (ZINC PO) Take 1 tablet by mouth daily as needed (leg/feet cramps).     ondansetron (ZOFRAN) 4 MG tablet Take 1 tablet (4 mg total) by mouth every 8 (eight) hours as needed  for nausea or vomiting. 40 tablet 0   oxyCODONE-acetaminophen (PERCOCET) 5-325 MG tablet Take 1 tablet by mouth every 8 (eight) hours as needed. To be taken after surgery 40 tablet 0   valACYclovir (VALTREX) 1000 MG tablet TAKE 2 TABLETS AT FIRST SIGN OF FEVER BLISTER, THEN 2 TABLETS 2 HOURSLATER (Patient taking differently: Take 2,000 mg by mouth 2 (two) times daily as needed (fever blisters.).) 4 tablet 5   vitamin B-12 (CYANOCOBALAMIN) 500 MCG tablet Take 500 mcg by mouth daily in the afternoon.     VITAMIN D PO Take 2,000 Units by mouth in the morning and at bedtime.     No current facility-administered medications for this visit.   Allergies:  Codeine, Demerol, Nsaids, and Quinine   Social History: The patient  reports that she has never smoked. She has never used smokeless tobacco. She reports that she does not drink alcohol and does not use drugs.   Family History: The patient's family history includes Breast cancer in her mother and sister; CAD (age of onset: 63) in an other family member; CAD (age of onset: 35) in her brother; CAD (age of onset: 7) in her father; CAD (age of onset: 52) in her sister; Other (age of onset: 32) in her brother; Other (age of onset: 54) in her maternal grandmother; Pneumonia (age of onset: 20) in her mother.   ROS:  Please see the history of present illness. Otherwise, complete review of systems is positive for none.  All other systems are reviewed and negative.   Physical Exam: VS:  BP (!) 140/94   Pulse 66   Ht '5\' 7"'$  (1.702 m)   Wt 170 lb 9.6 oz (77.4 kg)   SpO2 98%   BMI 26.72 kg/m , BMI Body mass index is 26.72 kg/m.  Wt Readings from Last 3 Encounters:  11/08/22 170 lb 9.6 oz (77.4 kg)  11/01/22 172 lb 12.8 oz (78.4 kg)  10/27/22 173 lb 14.4 oz (78.9 kg)    General: Patient appears comfortable at rest. HEENT: Conjunctiva and lids normal, oropharynx clear with moist mucosa. Neck: Supple, no elevated JVP or carotid bruits, no  thyromegaly. Lungs: Clear to auscultation, nonlabored breathing at rest. Cardiac: Regular rate and rhythm, no S3 or significant systolic murmur, no pericardial rub. Abdomen: Soft, nontender, no hepatomegaly, bowel sounds present, no guarding or rebound. Extremities: No pitting edema, distal pulses 2+. Skin: Warm and dry. Musculoskeletal: No kyphosis. Neuropsychiatric: Alert and oriented x3, affect grossly appropriate.  ECG:  An ECG dated 10/27/2022 was personally reviewed today and demonstrated:  Normal sinus rhythm with PACs  Recent Labwork: 01/11/2022: ALT 18; AST 19 10/27/2022: BUN 14; Creatinine, Ser 0.88; Hemoglobin 11.9; Platelets 203; Potassium 3.7; Sodium 142     Component Value Date/Time  CHOL 180 01/11/2022 1142   TRIG 119 01/11/2022 1142   HDL 57 01/11/2022 1142   CHOLHDL 3.2 01/11/2022 1142   CHOLHDL 2.9 09/10/2013 1016   VLDL 15 09/10/2013 1016   LDLCALC 102 (H) 01/11/2022 1142    Other Studies Reviewed Today: Echo from 10/2022 LVEF 60 to 47% grade 2 diastolic dysfunction Moderate eccentric anteriorly directed MR jet likely secondary to anterior mitral valve leaflet pathology  Assessment and Plan: Patient is a 76 year old F known to have HTN, hypothyroidism was referred to cardiology clinic for abnormal echo.  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 The risks, benefits and alternatives for the procedure were discussed and the patient comprehended these risks.  Risks include, but are not limited to, cough, sore throat, vomiting, nausea, somnolence, esophageal and stomach trauma or perforation, bleeding, low blood pressure, aspiration, pneumonia, infection and, trauma to the teeth. She is scheduled for TEE on 11/11/2022.  #HTN, controlled -Continue enalapril 10 mg once daily  I have spent a total of 45 minutes with patient reviewing chart, EKGs,  labs and examining patient as well as establishing an assessment and plan that was discussed with the patient.  > 50% of time was spent in direct patient care.      Medication Adjustments/Labs and Tests Ordered: Current medicines are reviewed at length with the patient today.  Concerns regarding medicines are outlined above.   Tests Ordered: No orders of the defined types were placed in this encounter.   Medication Changes: No orders of the defined types were placed in this encounter.   Disposition:  Follow up  1 year  Signed, Santosh Petter Fidel Levy, MD, 11/08/2022 4:46 PM     [facility] [address] [place], [place] [address]

## 2022-11-08 NOTE — Patient Instructions (Signed)
[name]  11/08/2022     '@PREFPERIOPPHARMACY'$ @   Your procedure is scheduled on  11/11/2022.   Report to [facility] at  1100  A.M.   Call this number if you have problems the morning of surgery:  [phone]  If you experience any cold or flu symptoms such as cough, fever, chills, shortness of breath, etc. between now and your scheduled surgery, please notify us at the above number.   Remember:  Do not eat or drink after midnight.      Take these medicines the morning of surgery with A SIP OF WATER           levothyroxine, robaxin (if needed), zofran(if needed), oxycodone (if needed).     Do not wear jewelry, make-up or nail polish.  Do not wear lotions, powders, or perfumes, or deodorant.  Do not shave 48 hours prior to surgery.  Men may shave face and neck.  Do not bring valuables to the hospital.  [facility] is not responsible for any belongings or valuables.  Contacts, dentures or bridgework may not be worn into surgery.  Leave your suitcase in the car.  After surgery it may be brought to your room.  For patients admitted to the hospital, discharge time will be determined by your treatment team.  Patients discharged the day of surgery will not be allowed to drive home and must have someone with them for 24 hours.    Special instructions:   DO NOT smoke tobacco or vape for 24 hours before your procedure.  Please read over the following fact sheets that you were given. Anesthesia Post-op Instructions and Care and Recovery After Surgery      Transesophageal Echocardiogram Transesophageal echocardiogram (TEE) is a test that uses sound waves to take pictures of your heart. TEE is done by passing a small probe attached to a flexible tube down the part of the body that moves food from your mouth to your stomach (esophagus). The pictures give clear images of your heart. This can help your doctor see if there are problems with your heart. Tell a doctor  about: Any allergies you have. All medicines you are taking. This includes vitamins, herbs, eye drops, creams, and over-the-counter medicines. Any problems you or family members have had with anesthetic medicines. Any blood disorders you have. Any surgeries you have had. Any medical conditions you have. Any swallowing problems. Whether you have or have had a blockage in the part of the body that moves food from your mouth to your stomach. Whether you are pregnant or may be pregnant. What are the risks? In general, this is a safe procedure. But, problems may occur, such as: Damage to nearby structures or organs. A tear in the part of the body that moves food from your mouth to your stomach. Irregular heartbeat. Hoarse voice or trouble swallowing. Bleeding. What happens before the procedure? Medicines Ask your doctor about changing or stopping: Your normal medicines. Vitamins, herbs, and supplements. Over-the-counter medicines. Do not take aspirin or ibuprofen unless you are told to. General instructions Follow instructions from your doctor about what you cannot eat or drink. You will take out any dentures or dental retainers. Plan to have a responsible adult take you home from the hospital or clinic. Plan to have a responsible adult care for you for the time you are told after you leave the hospital or clinic. This is important. What happens during the procedure?  An  IV will be put into one of your veins. You may be given: A sedative. This medicine helps you relax. A medicine to numb the back of your throat. This may be sprayed or gargled. Your blood pressure, heart rate, and breathing will be watched. You may be asked to lie on your left side. A bite block will be placed in your mouth. This keeps you from biting the tube. The tip of the probe will be placed into the back of your mouth. You will be asked to swallow. Your doctor will take pictures of your heart. The probe and  bite block will be taken out after the test is done. The procedure may vary among doctors and hospitals. What can I expect after the procedure? You will be monitored until you leave the hospital or clinic. This includes checking your blood pressure, heart rate, breathing rate, and blood oxygen level. Your throat may feel sore and numb. This will get better over time. You will not be allowed to eat or drink until the numbness has gone away. It is common to have a sore throat for a day or two. It is up to you to get the results of your procedure. Ask how to get your results when they are ready. Follow these instructions at home: If you were given a sedative during your procedure, do not drive or use machines until your doctor says that it is safe. Return to your normal activities when your doctor says that it is safe. Keep all follow-up visits. Summary TEE is a test that uses sound waves to take pictures of your heart. You will be given a medicine to help you relax. Do not drive or use machines until your doctor says that it is safe. This information is not intended to replace advice given to you by your health care provider. Make sure you discuss any questions you have with your health care provider. Document Revised: 08/11/2021 Document Reviewed: 07/21/2020 Elsevier Patient Education  [place] After The following information offers guidance on how to care for yourself after your procedure. Your health care provider may also give you more specific instructions. If you have problems or questions, contact your health care provider. What can I expect after the procedure? After the procedure, it is common to have: Tiredness. Little or no memory about what happened during or after the procedure. Impaired judgment when it comes to making decisions. Nausea or vomiting. Some trouble with balance. Follow these instructions at home: For the time period you  were told by your health care provider:  Rest. Do not participate in activities where you could fall or become injured. Do not drive or use machinery. Do not drink alcohol. Do not take sleeping pills or medicines that cause drowsiness. Do not make important decisions or sign legal documents. Do not take care of children on your own. Medicines Take over-the-counter and prescription medicines only as told by your health care provider. If you were prescribed antibiotics, take them as told by your health care provider. Do not stop using the antibiotic even if you start to feel better. Eating and drinking Follow instructions from your health care provider about what you may eat and drink. Drink enough fluid to keep your urine pale yellow. If you vomit: Drink clear fluids slowly and in small amounts as you are able. Clear fluids include water, ice chips, low-calorie sports drinks, and fruit juice that has water added to it (diluted fruit juice).  Eat light and bland foods in small amounts as you are able. These foods include bananas, applesauce, rice, lean meats, toast, and crackers. General instructions  Have a responsible adult stay with you for the time you are told. It is important to have someone help care for you until you are awake and alert. If you have sleep apnea, surgery and some medicines can increase your risk for breathing problems. Follow instructions from your health care provider about wearing your sleep device: When you are sleeping. This includes during daytime naps. While taking prescription pain medicines, sleeping medicines, or medicines that make you drowsy. Do not use any products that contain nicotine or tobacco. These products include cigarettes, chewing tobacco, and vaping devices, such as e-cigarettes. If you need help quitting, ask your health care provider. Contact a health care provider if: You feel nauseous or vomit every time you eat or drink. You feel  light-headed. You are still sleepy or having trouble with balance after 24 hours. You get a rash. You have a fever. You have redness or swelling around the IV site. Get help right away if: You have trouble breathing. You have new confusion after you get home. These symptoms may be an emergency. Get help right away. Call 911. Do not wait to see if the symptoms will go away. Do not drive yourself to the hospital. This information is not intended to replace advice given to you by your health care provider. Make sure you discuss any questions you have with your health care provider. Document Revised: 04/25/2022 Document Reviewed: 04/25/2022 Elsevier Patient Education  [place].

## 2022-11-08 NOTE — Progress Notes (Signed)
Cardiology Office Note  Date: 11/08/2022   ID: MISKI FELDPAUSCH, DOB July 26, 1946, MRN [id number]  PCP:  Coral Spikes, DO  Cardiologist:  None Electrophysiologist:  None   Reason for Office Visit: Abnormal echocardiogram   History of Present Illness: [name] is a 76 y.o. female known to have HTN, hypothyroidism presented to the cardiology clinic for abnormal echocardiogram. Echo was performed on 79/15/0569 due to systolic murmur and it showed LVEF 60 to 65% with grade 2 diastolic dysfunction and moderate MR, eccentric and anteriorly directed likely from anterior mitral valve leaflet pathology. TEE was recommended in the echo report and hence patient is here for the same. She is scheduled for left hip arthroplasty and is requesting for the TEE to be scheduled sooner so she can get the surgery prior to the response.  She denied any DOE, leg swelling, angina, lightness, dizziness, syncope. Denies smoking cigarettes, alcohol use or illicit drug abuse.  Past Medical History:  Diagnosis Date   Arthritis    Cancer [facility])    Thyroid cancer   COVID-19 virus infection 12/2020   Family history of adverse reaction to anesthesia    mother "couldn't wake up good" and would throw up   GERD (gastroesophageal reflux disease)    Heart murmur    History of kidney stones    Hypercholesteremia    resolved after gastric bypass   Hypertension    Hypothyroidism     Past Surgical History:  Procedure Laterality Date   ABDOMINAL HYSTERECTOMY     BILATERAL SALPINGOOPHORECTOMY     BREAST EXCISIONAL BIOPSY Bilateral 1995   benign   CHOLECYSTECTOMY     COLONOSCOPY  11/14/2011   Procedure: COLONOSCOPY;  Surgeon: Daneil Dolin, MD;  [facility];  Service: Endoscopy;  Laterality: N/A;   REPLACEMENT TOTAL KNEE BILATERAL     ROUX-EN-Y PROCEDURE  2007   TONSILLECTOMY     TOTAL HIP ARTHROPLASTY Right 12/23/2019   Procedure: RIGHT TOTAL HIP ARTHROPLASTY ANTERIOR APPROACH;  Surgeon: Leandrew Koyanagi, MD;  Location: [facility];  Service: Orthopedics;  Laterality: Right;   TOTAL THYROIDECTOMY  12/2010   TUBAL LIGATION      Current Outpatient Medications  Medication Sig Dispense Refill   acetaminophen (TYLENOL) 500 MG tablet Take 1,000 mg by mouth 2 (two) times daily as needed (for pain.).     amoxicillin (AMOXIL) 500 MG capsule Take 2,000 mg by mouth See admin instructions. Take 4 capsules (2000 mg) by mouth 1 hour prior to dental appointments     aspirin EC 81 MG tablet Take 1 tablet (81 mg total) by mouth 2 (two) times daily. To be taken after surgery to prevent blood clots 84 tablet 0   Calcium Carb-Cholecalciferol (OYSTER SHELL CALCIUM/D3 PO) Take 1 tablet by mouth in the morning and at bedtime.     carboxymethylcellulose (REFRESH PLUS) 0.5 % SOLN Place 1 drop into both eyes in the morning and at bedtime.     docusate sodium (COLACE) 100 MG capsule Take 1 capsule (100 mg total) by mouth daily as needed. 30 capsule 2   enalapril (VASOTEC) 10 MG tablet Take 1 tablet (10 mg total) by mouth at bedtime. 90 tablet 1   famotidine (PEPCID) 40 MG tablet TAKE 1 TABLET DAILY (CANCELRANTITIDINE) (Patient taking differently: Take 40 mg by mouth at bedtime.) 90 tablet 1   ferrous sulfate 325 (65 FE) MG tablet Take 325 mg by mouth daily in the afternoon.  levothyroxine (SYNTHROID) 150 MCG tablet Take 1 tablet (150 mcg total) by mouth daily before breakfast. 30 tablet 0   MAGNESIUM PO Take 1 tablet by mouth daily as needed (leg/feet cramps).     methocarbamol (ROBAXIN-750) 750 MG tablet Take 1 tablet (750 mg total) by mouth 2 (two) times daily as needed for muscle spasms. 30 tablet 2   Multiple Vitamin (MULTIVITAMIN WITH MINERALS) TABS tablet Take 1 tablet by mouth in the morning and at bedtime.     Multiple Vitamins-Minerals (ZINC PO) Take 1 tablet by mouth daily as needed (leg/feet cramps).     ondansetron (ZOFRAN) 4 MG tablet Take 1 tablet (4 mg total) by mouth every 8 (eight) hours as needed  for nausea or vomiting. 40 tablet 0   oxyCODONE-acetaminophen (PERCOCET) 5-325 MG tablet Take 1 tablet by mouth every 8 (eight) hours as needed. To be taken after surgery 40 tablet 0   valACYclovir (VALTREX) 1000 MG tablet TAKE 2 TABLETS AT FIRST SIGN OF FEVER BLISTER, THEN 2 TABLETS 2 HOURSLATER (Patient taking differently: Take 2,000 mg by mouth 2 (two) times daily as needed (fever blisters.).) 4 tablet 5   vitamin B-12 (CYANOCOBALAMIN) 500 MCG tablet Take 500 mcg by mouth daily in the afternoon.     VITAMIN D PO Take 2,000 Units by mouth in the morning and at bedtime.     No current facility-administered medications for this visit.   Allergies:  Codeine, Demerol, Nsaids, and Quinine   Social History: The patient  reports that she has never smoked. She has never used smokeless tobacco. She reports that she does not drink alcohol and does not use drugs.   Family History: The patient's family history includes Breast cancer in her mother and sister; CAD (age of onset: 71) in an other family member; CAD (age of onset: 12) in her brother; CAD (age of onset: 18) in her father; CAD (age of onset: 75) in her sister; Other (age of onset: 69) in her brother; Other (age of onset: 61) in her maternal grandmother; Pneumonia (age of onset: 25) in her mother.   ROS:  Please see the history of present illness. Otherwise, complete review of systems is positive for none.  All other systems are reviewed and negative.   Physical Exam: VS:  BP (!) 140/94   Pulse 66   Ht '5\' 7"'$  (1.702 m)   Wt 170 lb 9.6 oz (77.4 kg)   SpO2 98%   BMI 26.72 kg/m , BMI Body mass index is 26.72 kg/m.  Wt Readings from Last 3 Encounters:  11/08/22 170 lb 9.6 oz (77.4 kg)  11/01/22 172 lb 12.8 oz (78.4 kg)  10/27/22 173 lb 14.4 oz (78.9 kg)    General: Patient appears comfortable at rest. HEENT: Conjunctiva and lids normal, oropharynx clear with moist mucosa. Neck: Supple, no elevated JVP or carotid bruits, no  thyromegaly. Lungs: Clear to auscultation, nonlabored breathing at rest. Cardiac: Regular rate and rhythm, no S3 or significant systolic murmur, no pericardial rub. Abdomen: Soft, nontender, no hepatomegaly, bowel sounds present, no guarding or rebound. Extremities: No pitting edema, distal pulses 2+. Skin: Warm and dry. Musculoskeletal: No kyphosis. Neuropsychiatric: Alert and oriented x3, affect grossly appropriate.  ECG:  An ECG dated 10/27/2022 was personally reviewed today and demonstrated:  Normal sinus rhythm with PACs  Recent Labwork: 01/11/2022: ALT 18; AST 19 10/27/2022: BUN 14; Creatinine, Ser 0.88; Hemoglobin 11.9; Platelets 203; Potassium 3.7; Sodium 142     Component Value Date/Time  CHOL 180 01/11/2022 1142   TRIG 119 01/11/2022 1142   HDL 57 01/11/2022 1142   CHOLHDL 3.2 01/11/2022 1142   CHOLHDL 2.9 09/10/2013 1016   VLDL 15 09/10/2013 1016   LDLCALC 102 (H) 01/11/2022 1142    Other Studies Reviewed Today: Echo from 10/2022 LVEF 60 to 91% grade 2 diastolic dysfunction Moderate eccentric anteriorly directed MR jet likely secondary to anterior mitral valve leaflet pathology  Assessment and Plan: Patient is a 76 year old F known to have HTN, hypothyroidism was referred to cardiology clinic for abnormal echo.  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 The risks, benefits and alternatives for the procedure were discussed and the patient comprehended these risks.  Risks include, but are not limited to, cough, sore throat, vomiting, nausea, somnolence, esophageal and stomach trauma or perforation, bleeding, low blood pressure, aspiration, pneumonia, infection and, trauma to the teeth. She is scheduled for TEE on 11/11/2022.  #HTN, controlled -Continue enalapril 10 mg once daily  I have spent a total of 45 minutes with patient reviewing chart, EKGs,  labs and examining patient as well as establishing an assessment and plan that was discussed with the patient.  > 50% of time was spent in direct patient care.      Medication Adjustments/Labs and Tests Ordered: Current medicines are reviewed at length with the patient today.  Concerns regarding medicines are outlined above.   Tests Ordered: No orders of the defined types were placed in this encounter.   Medication Changes: No orders of the defined types were placed in this encounter.   Disposition:  Follow up  1 year  Signed, Mendel Binsfeld Fidel Levy, MD, 11/08/2022 4:46 PM    [facility] at [facility] [address], [place], [place] [address]

## 2022-11-09 ENCOUNTER — Encounter (HOSPITAL_COMMUNITY)
Admission: RE | Admit: 2022-11-09 | Discharge: 2022-11-09 | Disposition: A | Discharge status: Home / Self Care | Payer: Medicare HMO | Source: Ambulatory Visit | Attending: Internal Medicine | Admitting: Internal Medicine

## 2022-11-09 ENCOUNTER — Encounter (HOSPITAL_COMMUNITY): Payer: Self-pay

## 2022-11-09 ENCOUNTER — Other Ambulatory Visit: Payer: Self-pay

## 2022-11-09 ENCOUNTER — Ambulatory Visit: Payer: Self-pay | Admitting: Family Medicine

## 2022-11-10 ENCOUNTER — Other Ambulatory Visit (HOSPITAL_COMMUNITY): Payer: Medicare HMO

## 2022-11-11 ENCOUNTER — Ambulatory Visit (HOSPITAL_BASED_OUTPATIENT_CLINIC_OR_DEPARTMENT_OTHER): Payer: Medicare HMO | Admitting: Anesthesiology

## 2022-11-11 ENCOUNTER — Ambulatory Visit (HOSPITAL_COMMUNITY): Payer: Medicare HMO

## 2022-11-11 ENCOUNTER — Ambulatory Visit (HOSPITAL_COMMUNITY): Payer: Medicare HMO | Admitting: Anesthesiology

## 2022-11-11 ENCOUNTER — Encounter (HOSPITAL_COMMUNITY): Payer: Self-pay | Admitting: Internal Medicine

## 2022-11-11 ENCOUNTER — Encounter (HOSPITAL_COMMUNITY)
Admission: RE | Disposition: A | Discharge status: Home / Self Care | Payer: Self-pay | Source: Ambulatory Visit | Attending: Internal Medicine

## 2022-11-11 ENCOUNTER — Ambulatory Visit (HOSPITAL_COMMUNITY)
Admission: RE | Admit: 2022-11-11 | Discharge: 2022-11-11 | Disposition: A | Discharge status: Home / Self Care | Payer: Medicare HMO | Source: Ambulatory Visit | Attending: Internal Medicine | Admitting: Internal Medicine

## 2022-11-11 ENCOUNTER — Other Ambulatory Visit (HOSPITAL_COMMUNITY): Payer: Self-pay | Admitting: *Deleted

## 2022-11-11 DIAGNOSIS — M1990 Unspecified osteoarthritis, unspecified site: Secondary | ICD-10-CM

## 2022-11-11 DIAGNOSIS — R931 Abnormal findings on diagnostic imaging of heart and coronary circulation: Secondary | ICD-10-CM | POA: Diagnosis present

## 2022-11-11 DIAGNOSIS — K219 Gastro-esophageal reflux disease without esophagitis: Secondary | ICD-10-CM | POA: Diagnosis not present

## 2022-11-11 DIAGNOSIS — E039 Hypothyroidism, unspecified: Secondary | ICD-10-CM

## 2022-11-11 DIAGNOSIS — Z79899 Other long term (current) drug therapy: Secondary | ICD-10-CM | POA: Diagnosis not present

## 2022-11-11 DIAGNOSIS — I340 Nonrheumatic mitral (valve) insufficiency: Secondary | ICD-10-CM | POA: Diagnosis not present

## 2022-11-11 DIAGNOSIS — I10 Essential (primary) hypertension: Secondary | ICD-10-CM

## 2022-11-11 DIAGNOSIS — E890 Postprocedural hypothyroidism: Secondary | ICD-10-CM | POA: Diagnosis not present

## 2022-11-11 HISTORY — PX: TEE WITHOUT CARDIOVERSION: SHX5443

## 2022-11-11 SURGERY — ECHOCARDIOGRAM, TRANSESOPHAGEAL
Anesthesia: General

## 2022-11-11 MED ORDER — PROPOFOL 500 MG/50ML IV EMUL
INTRAVENOUS | Status: DC | PRN
  Administered 2022-11-11: 150 ug/kg/min via INTRAVENOUS

## 2022-11-11 MED ORDER — DEXAMETHASONE SODIUM PHOSPHATE 4 MG/ML IJ SOLN
INTRAMUSCULAR | Status: DC | PRN
  Administered 2022-11-11: 8 mg via INTRAVENOUS

## 2022-11-11 MED ORDER — ORAL CARE MOUTH RINSE: 15.0000 mL | Freq: Once | OROMUCOSAL | Status: AC

## 2022-11-11 MED ORDER — CHLORHEXIDINE GLUCONATE 0.12 % MT SOLN
OROMUCOSAL | Status: AC
  Filled 2022-11-11: qty 15

## 2022-11-11 MED ORDER — CHLORHEXIDINE GLUCONATE 0.12 % MT SOLN
15.0000 mL | Freq: Once | OROMUCOSAL | Status: AC
  Administered 2022-11-11: 15 mL via OROMUCOSAL

## 2022-11-11 MED ORDER — LACTATED RINGERS IV SOLN: INTRAVENOUS | Status: DC

## 2022-11-11 MED ORDER — HYDROCORTISONE SOD SUC (PF) 100 MG IJ SOLR
INTRAMUSCULAR | Status: DC | PRN
  Administered 2022-11-11: 100 mg via INTRAVENOUS

## 2022-11-11 MED ORDER — HYDROCORTISONE SOD SUC (PF) 100 MG IJ SOLR
INTRAMUSCULAR | Status: AC
  Filled 2022-11-11: qty 2

## 2022-11-11 MED ORDER — BUTAMBEN-TETRACAINE-BENZOCAINE 2-2-14 % EX AERO
INHALATION_SPRAY | CUTANEOUS | Status: DC | PRN
  Administered 2022-11-11: 1 via TOPICAL

## 2022-11-11 MED ORDER — SODIUM CHLORIDE 0.9 % IV SOLN: INTRAVENOUS | Status: DC

## 2022-11-11 MED ORDER — PROPOFOL 10 MG/ML IV BOLUS
INTRAVENOUS | Status: DC | PRN
  Administered 2022-11-11 (×2): 50 mg via INTRAVENOUS

## 2022-11-11 MED ORDER — BUTAMBEN-TETRACAINE-BENZOCAINE 2-2-14 % EX AERO
INHALATION_SPRAY | CUTANEOUS | Status: AC
  Filled 2022-11-11: qty 5

## 2022-11-11 NOTE — Anesthesia Procedure Notes (Signed)
Procedure Name: General with mask airway Date/Time: 11/11/2022 1:15 PM  Performed by: Maude Leriche, CRNAPre-anesthesia Checklist: Patient identified, Emergency Drugs available, Suction available, Patient being monitored and Timeout performed Patient Re-evaluated:Patient Re-evaluated prior to induction Oxygen Delivery Method: Nasal cannula Preoxygenation: Pre-oxygenation with 100% oxygen Induction Type: IV induction Placement Confirmation: positive ETCO2 Dental Injury: Teeth and Oropharynx as per pre-operative assessment

## 2022-11-11 NOTE — Progress Notes (Signed)
   TEE procedure aborted due to difficulty passing TEE probe despite using video glidescope. She appears to have narrowed passage prior to esophagus entry. She will need GI evaluation. 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 She can proceed with planned procedure, hip arthroplasty without further cardiac testing for the mitral valve abnormality. Continue scheduled follow-up with cardiology.   Kirstyn Lean Fidel Levy, MD [place]  [facility]  1:41 PM

## 2022-11-11 NOTE — Progress Notes (Signed)
Could NOT complete TEE, physician could not pass scope.     Alvino Chapel, RCS

## 2022-11-11 NOTE — Transfer of Care (Addendum)
Immediate Anesthesia Transfer of Care Note  Patient: [name]  Procedure(s) Performed: TRANSESOPHAGEAL ECHOCARDIOGRAM (TEE)  Patient Location: General  Anesthesia Type:General  Level of Consciousness: awake, alert , and oriented  Airway & Oxygen Therapy: Patient Spontanous Breathing and Patient connected to nasal cannula oxygen  Post-op Assessment: Report given to RN, Post -op Vital signs reviewed and stable, Patient moving all extremities X 4, and Patient able to stick tongue midline  Post vital signs: Reviewed  Last Vitals:  Vitals Value Taken Time  BP 134/87 11/11/22 1355  Temp 98.6   Pulse 63 11/11/22 1356  Resp 16 11/11/22 1356  SpO2 92 % 11/11/22 1356  Vitals shown include unvalidated device data.  Last Pain:  Vitals:   11/11/22 1134  TempSrc: Oral  PainSc: 0-No pain         Complications: No notable events documented.

## 2022-11-11 NOTE — Anesthesia Preprocedure Evaluation (Signed)
Anesthesia Evaluation  Patient identified by MRN, date of birth, ID band Patient awake    Reviewed: Allergy & Precautions, H&P , NPO status , Patient's Chart, lab work & pertinent test results  History of Anesthesia Complications (+) Family history of anesthesia reaction  Airway Mallampati: I  TM Distance: >3 FB Neck ROM: Full    Dental  (+) Dental Advisory Given, Caps   Pulmonary neg pulmonary ROS          Cardiovascular Exercise Tolerance: Good hypertension, Pt. on medications + Valvular Problems/Murmurs  Rhythm:Regular Rate:Normal + Systolic murmurs 1. Left ventricular ejection fraction, by estimation, is 60 to 65%. The  left ventricle has normal function. The left ventricle has no regional  wall motion abnormalities. Left ventricular diastolic parameters are  consistent with Grade II diastolic  dysfunction (pseudonormalization). The average left ventricular global  longitudinal strain is -29.1 %.   2. Right ventricular systolic function is mildly reduced. The right  ventricular size is normal.   3. Left atrial size was severely dilated.   4. Mitral valve leaflets are not adequately visualized. There appears to  be a mobile echodensity 0.59 x 0.89 cm attached possibly to the anterior  mitral valve leaflet by a stalk and is noted on the atrial side during  systole. Differential diagnoses  include chordal rupture, flail mitral valve leaflet, tumor, thrombus,  vegetation. Clinical correlation is recommended. Recommend TEE to further  evaluate the mitral valve pathology. Moderate eccentric anteriorly  directed mitral valve regurgitation. No  evidence of mitral stenosis.   5. The aortic valve was not well visualized. Aortic valve regurgitation  is not visualized. No aortic stenosis is present.   6. The inferior vena cava is normal in size with greater than 50%  respiratory variability, suggesting right atrial pressure of 3  mmHg.      Neuro/Psych negative neurological ROS  negative psych ROS   GI/Hepatic Neg liver ROS,GERD  Medicated and Controlled,,  Endo/Other  Hypothyroidism    Renal/GU negative Renal ROS  negative genitourinary   Musculoskeletal negative musculoskeletal ROS (+) Arthritis , Osteoarthritis,    Abdominal   Peds negative pediatric ROS (+)  Hematology negative hematology ROS (+)   Anesthesia Other Findings   Reproductive/Obstetrics negative OB ROS                             Anesthesia Physical Anesthesia Plan  ASA: 3  Anesthesia Plan: General   Post-op Pain Management: Minimal or no pain anticipated   Induction: Intravenous  PONV Risk Score and Plan: 0 and Propofol infusion  Airway Management Planned: Nasal Cannula and Natural Airway  Additional Equipment:   Intra-op Plan:   Post-operative Plan:   Informed Consent: I have reviewed the patients History and Physical, chart, labs and discussed the procedure including the risks, benefits and alternatives for the proposed anesthesia with the patient or authorized representative who has indicated his/her understanding and acceptance.     Dental advisory given  Plan Discussed with: CRNA and Surgeon  Anesthesia Plan Comments:         Anesthesia Quick Evaluation

## 2022-11-11 NOTE — Anesthesia Postprocedure Evaluation (Signed)
Anesthesia Post Note  Patient: GISELL BUEHRLE  Procedure(s) Performed: TRANSESOPHAGEAL ECHOCARDIOGRAM (TEE)  Patient location during evaluation: PACU Anesthesia Type: General Level of consciousness: awake and alert Pain management: pain level controlled Vital Signs Assessment: post-procedure vital signs reviewed and stable Respiratory status: spontaneous breathing, nonlabored ventilation and respiratory function stable Cardiovascular status: blood pressure returned to baseline and stable Postop Assessment: no apparent nausea or vomiting Anesthetic complications: no Comments: Multiple attempts to pass TEE scope, dexamethasone 8 mg and hydrocortisone 100 mg iv was given to prevent pharyngeal edema, talked to patient's daughter and advised to go to ER if there is any difficulty breathing.   No notable events documented.   Last Vitals:  Vitals:   11/11/22 1415 11/11/22 1416  BP: (!) 146/89 (!) 143/92  Pulse: (!) 59 62  Resp: 18 18  Temp:  36.7 C  SpO2: 100% 100%    Last Pain:  Vitals:   11/11/22 1416  TempSrc: Oral  PainSc: 0-No pain                 Srinivas Lippman C Roxine Whittinghill

## 2022-11-11 NOTE — Interval H&P Note (Signed)
History and Physical Interval Note:  11/11/2022 1:01 PM  [name]  has presented today for surgery, with the diagnosis of mitral regurgatation.  The various methods of treatment have been discussed with the patient and family. After consideration of risks, benefits and other options for treatment, the patient has consented to  Procedure(s): TRANSESOPHAGEAL ECHOCARDIOGRAM (TEE) (N/A) as a surgical intervention.  The patient's history has been reviewed, patient examined, no change in status, stable for surgery.  I have reviewed the patient's chart and labs.  Questions were answered to the patient's satisfaction.     Clemente Dewey Olene Craven, MD [place]

## 2022-11-11 NOTE — Anesthesia Procedure Notes (Signed)
Date/Time: 11/11/2022 1:26 PM  Performed by: Maude Leriche, CRNALaryngoscope Size: Glidescope and 4 Placement Confirmation: positive ETCO2 Comments: Preexisting heme observed in oropharynx. Glidescope used for visulization of oropharynx, not for intubation

## 2022-11-14 ENCOUNTER — Other Ambulatory Visit: Payer: Self-pay | Admitting: Physician Assistant

## 2022-11-14 MED ORDER — RIVAROXABAN 10 MG PO TABS: 10.0000 mg | ORAL_TABLET | Freq: Every day | ORAL | 0 refills | Status: DC

## 2022-11-14 NOTE — Progress Notes (Addendum)
Anesthesia Follow-up:   Case: [id number] Date/Time: 11/21/22 1045   Procedure: LEFT TOTAL HIP ARTHROPLASTY ANTERIOR APPROACH (Left: Hip) - 3-C   Anesthesia type: Spinal   Pre-op diagnosis: left hip degenerative joint disease   Location: [facility] ROOM 06 / [facility]   Surgeons: Tarry Kos, MD       DISCUSSION:  See my previously Anesthesia APP note for Date of Service 10/27/22. She initially had left THA scheduled for 11/07/22 but postponed until murmur could be further evaluated. She had known prior history of murmur and had mild AI on 2018 echo, but a more prominent murmur was noted at 10/27/22 PAT visit, so PCP Dr. Adriana Simas arranged for a preoperative echo.   Echo 11/02/22: IMPRESSIONS   1. Left ventricular ejection fraction, by estimation, is 60 to 65%. The left ventricle has normal function. The left ventricle has no regional wall motion abnormalities. Left ventricular diastolic parameters are consistent with Grade II diastolic dysfunction (pseudonormalization). The average left ventricular global longitudinal strain is -29.1 %.   2. Right ventricular systolic function is mildly reduced. The right ventricular size is normal.   3. Left atrial size was severely dilated.   4. Mitral valve leaflets are not adequately visualized. There appears to be a mobile echodensity 0.59 x 0.89 cm attached possibly to the anterior mitral valve leaflet by a stalk and is noted on the atrial side during systole. Differential diagnoses include chordal rupture, flail mitral valve leaflet, tumor, thrombus, vegetation. Clinical correlation is recommended. Recommend TEE to further evaluate the mitral valve pathology. Moderate eccentric anteriorly directed mitral valve regurgitation. No evidence of mitral stenosis.   5. The aortic valve was not well visualized. Aortic valve regurgitation is not visualized. No aortic stenosis is present.   6. The inferior vena cava is normal in size with greater than 50% respiratory variability,  suggesting right atrial pressure of 3 mmHg.   Based on echo results, she was referred to cardiology and seen by Luane School, MD on 11/08/22 ([facility] [place]). She evaluated her on 11/08/22. Moderate MR felt likely secondary to anterior mitral valve leaflet pathology. TEE was ordered and scheduled for 11/11/22; however, TEE was aborted due to difficulty passing TEE probe despite using video glidescope. Dexamethasone 8 mg and hydrocortisone 100 mg IV were also given to prevent pharyngeal edema.    Per 11/11/22 Progress Note by Dr. Jenene Slicker, "TEE procedure aborted due to difficulty passing TEE probe despite using video glidescope. She appears to have narrowed passage prior to esophagus entry. She will need GI evaluation. 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 She can proceed with planned procedure, hip arthroplasty without further cardiac testing for the mitral valve abnormality. Continue scheduled follow-up with cardiology." She also sent direct communication to me and Dr. Roda Shutters. Surgery is now scheduled for 11/21/22. Recent echo and cardiology input also reviewed with anesthesiologist Harvie Junior, MD.    Shonna Chock, PA-C Surgical Short Stay/Anesthesiology [facility] Phone [phone] [facility] Phone [phone] 11/15/2022 12:34 PM

## 2022-11-17 ENCOUNTER — Other Ambulatory Visit: Payer: Self-pay | Admitting: Physician Assistant

## 2022-11-17 ENCOUNTER — Telehealth: Payer: Self-pay | Admitting: Orthopaedic Surgery

## 2022-11-17 NOTE — Telephone Encounter (Signed)
Patient called this morning regarding a couple of prescriptions she is to pick up prior to right total hip surgery on Monday 11-21-22.  She stated other instructions were given related to baby aspirin when she was previously scheduled on 11-07-22.  She would like for someone to call her prior to the surgery.  She can be reached at [phone].

## 2022-11-17 NOTE — Telephone Encounter (Signed)
Spoke to patient

## 2022-11-18 ENCOUNTER — Other Ambulatory Visit: Payer: Self-pay

## 2022-11-18 ENCOUNTER — Encounter (HOSPITAL_COMMUNITY): Payer: Self-pay | Admitting: Orthopaedic Surgery

## 2022-11-18 NOTE — Progress Notes (Signed)
Called to review surgical instructions, new arrival time. No new changes since PAT appt, all questions answered.   Pt's surgical orders no longer present. Staff message sent requesting orders.

## 2022-11-20 ENCOUNTER — Telehealth: Payer: Self-pay | Admitting: *Deleted

## 2022-11-20 NOTE — Care Plan (Signed)
[facility] RNCM call to patient to discuss her upcoming Left total hip replacement with Dr. Erlinda Hong on 11/21/22. She is an Ortho bundle patient through [facility] and is agreeable to case management. She lives alone, but has family (daughter/grand kids) that will be assisting her after discharge. Anticipate HHPT will be needed after a short hospital stay. Referral made to [facility] after choice provided. She has a RW and 3in1/BSC already. Reviewed post op instructions. Will continue to follow for needs.

## 2022-11-20 NOTE — Anesthesia Preprocedure Evaluation (Signed)
Anesthesia Evaluation  Patient identified by MRN, date of birth, ID band Patient awake    Reviewed: Allergy & Precautions, NPO status , Patient's Chart, lab work & pertinent test results  Airway Mallampati: II  TM Distance: >3 FB Neck ROM: Full    Dental no notable dental hx. (+) Teeth Intact, Dental Advisory Given   Pulmonary neg pulmonary ROS   Pulmonary exam normal breath sounds clear to auscultation       Cardiovascular hypertension, Normal cardiovascular exam Rhythm:Regular Rate:Normal  11/02/2022 TEE 1. Left ventricular ejection fraction, by estimation, is 60 to 65%. The  left ventricle has normal function. The left ventricle has no regional  wall motion abnormalities. Left ventricular diastolic parameters are  consistent with Grade II diastolic  dysfunction (pseudonormalization). The average left ventricular global  longitudinal strain is -29.1 %.   2. Right ventricular systolic function is mildly reduced. The right  ventricular size is normal.   3. Left atrial size was severely dilated.   4. Mitral valve leaflets are not adequately visualized. There appears to  be a mobile echodensity 0.59 x 0.89 cm attached possibly to the anterior  mitral valve leaflet by a stalk and is noted on the atrial side during  systole. Differential diagnoses  include chordal rupture, flail mitral valve leaflet, tumor, thrombus,  vegetation. Clinical correlation is recommended. Recommend TEE to further  evaluate the mitral valve pathology. Moderate eccentric anteriorly  directed mitral valve regurgitation. No  evidence of mitral stenosis.   5. The aortic valve was not well visualized. Aortic valve regurgitation  is not visualized. No aortic stenosis is present.   6. The inferior vena cava is normal in size with greater than 50%  respiratory variability, suggesting right atrial pressure of 3 mmHg.     Neuro/Psych negative neurological ROS   negative psych ROS   GI/Hepatic Neg liver ROS,GERD  Medicated and Controlled,,  Endo/Other  Hypothyroidism    Renal/GU negative Renal ROS     Musculoskeletal  (+) Arthritis ,    Abdominal   Peds  Hematology   Anesthesia Other Findings All: codeine, demerol, Quinine, Nsaids  Reproductive/Obstetrics                             Anesthesia Physical Anesthesia Plan  ASA: 3  Anesthesia Plan: Spinal and General   Post-op Pain Management: Tylenol PO (pre-op)*   Induction: Intravenous  PONV Risk Score and Plan: 3 and Treatment may vary due to age or medical condition, Ondansetron and Dexamethasone  Airway Management Planned: Oral ETT  Additional Equipment: Arterial line  Intra-op Plan:   Post-operative Plan: Extubation in OR  Informed Consent: I have reviewed the patients History and Physical, chart, labs and discussed the procedure including the risks, benefits and alternatives for the proposed anesthesia with the patient or authorized representative who has indicated his/her understanding and acceptance.     Dental advisory given  Plan Discussed with:   Anesthesia Plan Comments: (Pt on xarelto)        Anesthesia Quick Evaluation

## 2022-11-20 NOTE — Telephone Encounter (Signed)
Ortho bundle pre-op call completed. 

## 2022-11-21 ENCOUNTER — Other Ambulatory Visit: Payer: Self-pay

## 2022-11-21 ENCOUNTER — Ambulatory Visit (HOSPITAL_BASED_OUTPATIENT_CLINIC_OR_DEPARTMENT_OTHER): Payer: Medicare HMO | Admitting: Vascular Surgery

## 2022-11-21 ENCOUNTER — Encounter (HOSPITAL_COMMUNITY): Payer: Self-pay | Admitting: Orthopaedic Surgery

## 2022-11-21 ENCOUNTER — Observation Stay (HOSPITAL_COMMUNITY)
Admission: RE | Admit: 2022-11-21 | Discharge: 2022-11-23 | Disposition: A | Discharge status: Home Health | Payer: Medicare HMO | Source: Ambulatory Visit | Attending: Orthopaedic Surgery | Admitting: Orthopaedic Surgery

## 2022-11-21 ENCOUNTER — Ambulatory Visit (HOSPITAL_COMMUNITY): Payer: Medicare HMO

## 2022-11-21 ENCOUNTER — Observation Stay (HOSPITAL_COMMUNITY): Payer: Medicare HMO

## 2022-11-21 ENCOUNTER — Ambulatory Visit (HOSPITAL_COMMUNITY): Payer: Medicare HMO | Admitting: Vascular Surgery

## 2022-11-21 ENCOUNTER — Encounter (HOSPITAL_COMMUNITY)
Admission: RE | Disposition: A | Discharge status: Home Health | Payer: Self-pay | Source: Ambulatory Visit | Attending: Orthopaedic Surgery

## 2022-11-21 ENCOUNTER — Other Ambulatory Visit: Payer: Self-pay | Admitting: Family Medicine

## 2022-11-21 DIAGNOSIS — I10 Essential (primary) hypertension: Secondary | ICD-10-CM | POA: Diagnosis not present

## 2022-11-21 DIAGNOSIS — Z85850 Personal history of malignant neoplasm of thyroid: Secondary | ICD-10-CM | POA: Insufficient documentation

## 2022-11-21 DIAGNOSIS — M25452 Effusion, left hip: Secondary | ICD-10-CM | POA: Diagnosis not present

## 2022-11-21 DIAGNOSIS — Z96641 Presence of right artificial hip joint: Secondary | ICD-10-CM | POA: Insufficient documentation

## 2022-11-21 DIAGNOSIS — M659 Synovitis and tenosynovitis, unspecified: Secondary | ICD-10-CM | POA: Insufficient documentation

## 2022-11-21 DIAGNOSIS — Z79899 Other long term (current) drug therapy: Secondary | ICD-10-CM | POA: Diagnosis not present

## 2022-11-21 DIAGNOSIS — M1612 Unilateral primary osteoarthritis, left hip: Secondary | ICD-10-CM

## 2022-11-21 DIAGNOSIS — Z7901 Long term (current) use of anticoagulants: Secondary | ICD-10-CM | POA: Diagnosis not present

## 2022-11-21 DIAGNOSIS — Z96653 Presence of artificial knee joint, bilateral: Secondary | ICD-10-CM | POA: Insufficient documentation

## 2022-11-21 DIAGNOSIS — Z8616 Personal history of COVID-19: Secondary | ICD-10-CM | POA: Diagnosis not present

## 2022-11-21 DIAGNOSIS — E039 Hypothyroidism, unspecified: Secondary | ICD-10-CM | POA: Insufficient documentation

## 2022-11-21 DIAGNOSIS — Z471 Aftercare following joint replacement surgery: Secondary | ICD-10-CM | POA: Diagnosis not present

## 2022-11-21 DIAGNOSIS — Z96642 Presence of left artificial hip joint: Secondary | ICD-10-CM | POA: Diagnosis not present

## 2022-11-21 DIAGNOSIS — I878 Other specified disorders of veins: Secondary | ICD-10-CM | POA: Diagnosis not present

## 2022-11-21 HISTORY — PX: TOTAL HIP ARTHROPLASTY: SHX124

## 2022-11-21 LAB — TYPE AND SCREEN
ABO/RH(D): O POS
Antibody Screen: NEGATIVE

## 2022-11-21 SURGERY — ARTHROPLASTY, HIP, TOTAL, ANTERIOR APPROACH
Anesthesia: Spinal | Site: Hip | Laterality: Left

## 2022-11-21 MED ORDER — PANTOPRAZOLE SODIUM 40 MG PO TBEC
40.0000 mg | DELAYED_RELEASE_TABLET | Freq: Every day | ORAL | Status: DC
  Administered 2022-11-21 – 2022-11-23 (×3): 40 mg via ORAL
  Filled 2022-11-21 (×3): qty 1

## 2022-11-21 MED ORDER — METOCLOPRAMIDE HCL 5 MG PO TABS: 5.0000 mg | ORAL_TABLET | Freq: Three times a day (TID) | ORAL | Status: DC | PRN

## 2022-11-21 MED ORDER — CEFAZOLIN SODIUM-DEXTROSE 2-4 GM/100ML-% IV SOLN
2.0000 g | INTRAVENOUS | Status: AC
  Administered 2022-11-21: 2 g via INTRAVENOUS
  Filled 2022-11-21: qty 100

## 2022-11-21 MED ORDER — BUPIVACAINE-MELOXICAM ER 400-12 MG/14ML IJ SOLN
INTRAMUSCULAR | Status: DC | PRN
  Administered 2022-11-21: 14 mL

## 2022-11-21 MED ORDER — ENALAPRIL MALEATE 2.5 MG PO TABS
10.0000 mg | ORAL_TABLET | Freq: Every day | ORAL | Status: DC
  Administered 2022-11-22: 10 mg via ORAL
  Filled 2022-11-21 (×3): qty 4

## 2022-11-21 MED ORDER — POLYETHYLENE GLYCOL 3350 17 G PO PACK
17.0000 g | PACK | Freq: Every day | ORAL | Status: DC
  Filled 2022-11-21 (×3): qty 1

## 2022-11-21 MED ORDER — OXYCODONE HCL 5 MG PO TABS
ORAL_TABLET | ORAL | Status: AC
  Filled 2022-11-21: qty 2

## 2022-11-21 MED ORDER — PROPOFOL 10 MG/ML IV BOLUS
INTRAVENOUS | Status: AC
  Filled 2022-11-21: qty 20

## 2022-11-21 MED ORDER — FENTANYL CITRATE (PF) 100 MCG/2ML IJ SOLN
25.0000 ug | INTRAMUSCULAR | Status: DC | PRN
  Administered 2022-11-21: 25 ug via INTRAVENOUS
  Administered 2022-11-21: 50 ug via INTRAVENOUS
  Administered 2022-11-21: 25 ug via INTRAVENOUS

## 2022-11-21 MED ORDER — ORAL CARE MOUTH RINSE: 15.0000 mL | Freq: Once | OROMUCOSAL | Status: AC

## 2022-11-21 MED ORDER — ONDANSETRON HCL 4 MG PO TABS: 4.0000 mg | ORAL_TABLET | Freq: Four times a day (QID) | ORAL | Status: DC | PRN

## 2022-11-21 MED ORDER — FENTANYL CITRATE (PF) 250 MCG/5ML IJ SOLN
INTRAMUSCULAR | Status: AC
  Filled 2022-11-21: qty 5

## 2022-11-21 MED ORDER — PHENOL 1.4 % MT LIQD: 1.0000 | OROMUCOSAL | Status: DC | PRN

## 2022-11-21 MED ORDER — ACETAMINOPHEN 10 MG/ML IV SOLN
1000.0000 mg | Freq: Once | INTRAVENOUS | Status: DC | PRN
  Administered 2022-11-21: 1000 mg via INTRAVENOUS

## 2022-11-21 MED ORDER — MAGNESIUM CITRATE PO SOLN
1.0000 | Freq: Once | ORAL | Status: DC | PRN
  Filled 2022-11-21: qty 296

## 2022-11-21 MED ORDER — ACETAMINOPHEN 10 MG/ML IV SOLN
INTRAVENOUS | Status: AC
  Filled 2022-11-21: qty 100

## 2022-11-21 MED ORDER — METHOCARBAMOL 500 MG PO TABS
500.0000 mg | ORAL_TABLET | Freq: Four times a day (QID) | ORAL | Status: DC | PRN
  Administered 2022-11-21 – 2022-11-22 (×3): 500 mg via ORAL
  Filled 2022-11-21 (×3): qty 1

## 2022-11-21 MED ORDER — ACETAMINOPHEN 500 MG PO TABS
1000.0000 mg | ORAL_TABLET | Freq: Four times a day (QID) | ORAL | Status: AC
  Administered 2022-11-21 – 2022-11-22 (×2): 1000 mg via ORAL
  Filled 2022-11-21 (×3): qty 2

## 2022-11-21 MED ORDER — RIVAROXABAN 10 MG PO TABS
10.0000 mg | ORAL_TABLET | Freq: Every day | ORAL | Status: DC
  Administered 2022-11-22 – 2022-11-23 (×2): 10 mg via ORAL
  Filled 2022-11-21 (×2): qty 1

## 2022-11-21 MED ORDER — TRANEXAMIC ACID-NACL 1000-0.7 MG/100ML-% IV SOLN
1000.0000 mg | INTRAVENOUS | Status: AC
  Administered 2022-11-21: 1000 mg via INTRAVENOUS
  Filled 2022-11-21: qty 100

## 2022-11-21 MED ORDER — ONDANSETRON HCL 4 MG/2ML IJ SOLN: 4.0000 mg | Freq: Once | INTRAMUSCULAR | Status: DC | PRN

## 2022-11-21 MED ORDER — VANCOMYCIN HCL 1000 MG IV SOLR
INTRAVENOUS | Status: AC
  Filled 2022-11-21: qty 20

## 2022-11-21 MED ORDER — FENTANYL CITRATE (PF) 250 MCG/5ML IJ SOLN
INTRAMUSCULAR | Status: DC | PRN
  Administered 2022-11-21: 50 ug via INTRAVENOUS
  Administered 2022-11-21: 25 ug via INTRAVENOUS
  Administered 2022-11-21 (×5): 50 ug via INTRAVENOUS
  Administered 2022-11-21: 25 ug via INTRAVENOUS

## 2022-11-21 MED ORDER — LACTATED RINGERS IV SOLN: INTRAVENOUS | Status: DC

## 2022-11-21 MED ORDER — BUPIVACAINE-MELOXICAM ER 400-12 MG/14ML IJ SOLN
INTRAMUSCULAR | Status: AC
  Filled 2022-11-21: qty 1

## 2022-11-21 MED ORDER — DIPHENHYDRAMINE HCL 12.5 MG/5ML PO ELIX: 25.0000 mg | ORAL_SOLUTION | ORAL | Status: DC | PRN

## 2022-11-21 MED ORDER — AMISULPRIDE (ANTIEMETIC) 5 MG/2ML IV SOLN
10.0000 mg | Freq: Once | INTRAVENOUS | Status: AC
  Administered 2022-11-21: 10 mg via INTRAVENOUS

## 2022-11-21 MED ORDER — DEXAMETHASONE SODIUM PHOSPHATE 10 MG/ML IJ SOLN
10.0000 mg | Freq: Once | INTRAMUSCULAR | Status: AC
  Administered 2022-11-22: 10 mg via INTRAVENOUS
  Filled 2022-11-21: qty 1

## 2022-11-21 MED ORDER — OXYCODONE HCL 5 MG PO TABS
5.0000 mg | ORAL_TABLET | ORAL | Status: DC | PRN
  Administered 2022-11-21: 10 mg via ORAL

## 2022-11-21 MED ORDER — ALUM & MAG HYDROXIDE-SIMETH 200-200-20 MG/5ML PO SUSP: 30.0000 mL | ORAL | Status: DC | PRN

## 2022-11-21 MED ORDER — KETAMINE HCL 10 MG/ML IJ SOLN
INTRAMUSCULAR | Status: DC | PRN
  Administered 2022-11-21: 10 mg via INTRAVENOUS

## 2022-11-21 MED ORDER — CHLORHEXIDINE GLUCONATE 0.12 % MT SOLN
15.0000 mL | Freq: Once | OROMUCOSAL | Status: AC
  Administered 2022-11-21: 15 mL via OROMUCOSAL
  Filled 2022-11-21: qty 15

## 2022-11-21 MED ORDER — PHENYLEPHRINE HCL-NACL 20-0.9 MG/250ML-% IV SOLN
INTRAVENOUS | Status: DC | PRN
  Administered 2022-11-21: 15 ug/min via INTRAVENOUS

## 2022-11-21 MED ORDER — METOCLOPRAMIDE HCL 5 MG/ML IJ SOLN: 5.0000 mg | Freq: Three times a day (TID) | INTRAMUSCULAR | Status: DC | PRN

## 2022-11-21 MED ORDER — FERROUS SULFATE 325 (65 FE) MG PO TABS
325.0000 mg | ORAL_TABLET | Freq: Three times a day (TID) | ORAL | Status: DC
  Administered 2022-11-21 – 2022-11-23 (×5): 325 mg via ORAL
  Filled 2022-11-21 (×5): qty 1

## 2022-11-21 MED ORDER — DOCUSATE SODIUM 100 MG PO CAPS
100.0000 mg | ORAL_CAPSULE | Freq: Two times a day (BID) | ORAL | Status: DC
  Administered 2022-11-21 – 2022-11-23 (×4): 100 mg via ORAL
  Filled 2022-11-21 (×4): qty 1

## 2022-11-21 MED ORDER — METHOCARBAMOL 1000 MG/10ML IJ SOLN: 500.0000 mg | Freq: Four times a day (QID) | INTRAVENOUS | Status: DC | PRN

## 2022-11-21 MED ORDER — PROPOFOL 10 MG/ML IV BOLUS
INTRAVENOUS | Status: DC | PRN
  Administered 2022-11-21 (×2): 20 mg via INTRAVENOUS
  Administered 2022-11-21: 120 mg via INTRAVENOUS
  Administered 2022-11-21 (×2): 20 mg via INTRAVENOUS

## 2022-11-21 MED ORDER — MENTHOL 3 MG MT LOZG: 1.0000 | LOZENGE | OROMUCOSAL | Status: DC | PRN

## 2022-11-21 MED ORDER — ACETAMINOPHEN 325 MG PO TABS
325.0000 mg | ORAL_TABLET | Freq: Four times a day (QID) | ORAL | Status: DC | PRN
  Administered 2022-11-22: 650 mg via ORAL
  Filled 2022-11-21: qty 2

## 2022-11-21 MED ORDER — FENTANYL CITRATE (PF) 100 MCG/2ML IJ SOLN
INTRAMUSCULAR | Status: AC
  Filled 2022-11-21: qty 2

## 2022-11-21 MED ORDER — DEXMEDETOMIDINE HCL IN NACL 80 MCG/20ML IV SOLN
INTRAVENOUS | Status: DC | PRN
  Administered 2022-11-21 (×2): 4 ug via BUCCAL
  Administered 2022-11-21: 8 ug via BUCCAL
  Administered 2022-11-21: 4 ug via BUCCAL

## 2022-11-21 MED ORDER — TRANEXAMIC ACID 1000 MG/10ML IV SOLN
2000.0000 mg | INTRAVENOUS | Status: DC
  Filled 2022-11-21: qty 20

## 2022-11-21 MED ORDER — MIDAZOLAM HCL 2 MG/2ML IJ SOLN
INTRAMUSCULAR | Status: AC
  Filled 2022-11-21: qty 2

## 2022-11-21 MED ORDER — CEFAZOLIN SODIUM-DEXTROSE 2-4 GM/100ML-% IV SOLN
2.0000 g | Freq: Four times a day (QID) | INTRAVENOUS | Status: AC
  Administered 2022-11-21 (×2): 2 g via INTRAVENOUS
  Filled 2022-11-21 (×2): qty 100

## 2022-11-21 MED ORDER — MIDAZOLAM HCL 5 MG/5ML IJ SOLN
INTRAMUSCULAR | Status: DC | PRN
  Administered 2022-11-21: 1 mg via INTRAVENOUS

## 2022-11-21 MED ORDER — LIDOCAINE 2% (20 MG/ML) 5 ML SYRINGE
INTRAMUSCULAR | Status: DC | PRN
  Administered 2022-11-21: 80 mg via INTRAVENOUS

## 2022-11-21 MED ORDER — LEVOTHYROXINE SODIUM 75 MCG PO TABS
150.0000 ug | ORAL_TABLET | Freq: Every day | ORAL | Status: DC
  Administered 2022-11-22 – 2022-11-23 (×2): 150 ug via ORAL
  Filled 2022-11-21 (×2): qty 2

## 2022-11-21 MED ORDER — SORBITOL 70 % SOLN
30.0000 mL | Freq: Every day | Status: DC | PRN
  Filled 2022-11-21: qty 30

## 2022-11-21 MED ORDER — OXYCODONE HCL 5 MG PO TABS: 10.0000 mg | ORAL_TABLET | ORAL | Status: DC | PRN

## 2022-11-21 MED ORDER — SODIUM CHLORIDE 0.9 % IV SOLN: INTRAVENOUS | Status: DC

## 2022-11-21 MED ORDER — TRANEXAMIC ACID-NACL 1000-0.7 MG/100ML-% IV SOLN
1000.0000 mg | Freq: Once | INTRAVENOUS | Status: AC
  Administered 2022-11-21: 1000 mg via INTRAVENOUS
  Filled 2022-11-21: qty 100

## 2022-11-21 MED ORDER — ONDANSETRON HCL 4 MG/2ML IJ SOLN: 4.0000 mg | Freq: Four times a day (QID) | INTRAMUSCULAR | Status: DC | PRN

## 2022-11-21 MED ORDER — HYDROMORPHONE HCL 1 MG/ML IJ SOLN: 0.5000 mg | INTRAMUSCULAR | Status: DC | PRN

## 2022-11-21 MED ORDER — AMISULPRIDE (ANTIEMETIC) 5 MG/2ML IV SOLN
INTRAVENOUS | Status: AC
  Filled 2022-11-21: qty 4

## 2022-11-21 MED ORDER — KETAMINE HCL 50 MG/5ML IJ SOSY
PREFILLED_SYRINGE | INTRAMUSCULAR | Status: AC
  Filled 2022-11-21: qty 5

## 2022-11-21 MED ORDER — OXYCODONE HCL ER 10 MG PO T12A
10.0000 mg | EXTENDED_RELEASE_TABLET | Freq: Two times a day (BID) | ORAL | Status: DC
  Administered 2022-11-21 – 2022-11-23 (×5): 10 mg via ORAL
  Filled 2022-11-21 (×5): qty 1

## 2022-11-21 MED ORDER — SUGAMMADEX SODIUM 200 MG/2ML IV SOLN
INTRAVENOUS | Status: DC | PRN
  Administered 2022-11-21: 150 mg via INTRAVENOUS

## 2022-11-21 MED ORDER — ROCURONIUM BROMIDE 10 MG/ML (PF) SYRINGE
PREFILLED_SYRINGE | INTRAVENOUS | Status: DC | PRN
  Administered 2022-11-21: 20 mg via INTRAVENOUS
  Administered 2022-11-21: 50 mg via INTRAVENOUS

## 2022-11-21 MED ORDER — ONDANSETRON HCL 4 MG/2ML IJ SOLN
INTRAMUSCULAR | Status: DC | PRN
  Administered 2022-11-21: 4 mg via INTRAVENOUS

## 2022-11-21 MED ORDER — PHENYLEPHRINE 80 MCG/ML (10ML) SYRINGE FOR IV PUSH (FOR BLOOD PRESSURE SUPPORT)
PREFILLED_SYRINGE | INTRAVENOUS | Status: DC | PRN
  Administered 2022-11-21: 40 ug via INTRAVENOUS
  Administered 2022-11-21: 80 ug via INTRAVENOUS
  Administered 2022-11-21: 40 ug via INTRAVENOUS

## 2022-11-21 MED ORDER — POVIDONE-IODINE 10 % EX SWAB: 2.0000 | Freq: Once | CUTANEOUS | Status: DC

## 2022-11-21 MED ORDER — DEXAMETHASONE SODIUM PHOSPHATE 10 MG/ML IJ SOLN
INTRAMUSCULAR | Status: DC | PRN
  Administered 2022-11-21: 4 mg via INTRAVENOUS

## 2022-11-21 SURGICAL SUPPLY — 65 items
ACETAB CUP W/GRIPTION 54 (Plate) ×1 IMPLANT
BAG COUNTER SPONGE SURGICOUNT (BAG) ×1 IMPLANT
BAG DECANTER FOR FLEXI CONT (MISCELLANEOUS) ×1 IMPLANT
CELLS DAT CNTRL 66122 CELL SVR (MISCELLANEOUS) IMPLANT
COVER PERINEAL POST (MISCELLANEOUS) ×1 IMPLANT
COVER SURGICAL LIGHT HANDLE (MISCELLANEOUS) ×1 IMPLANT
CUP ACETAB W/GRIPTION 54 (Plate) IMPLANT
DRAPE C-ARM 42X72 X-RAY (DRAPES) ×1 IMPLANT
DRAPE POUCH INSTRU U-SHP 10X18 (DRAPES) ×1 IMPLANT
DRAPE STERI IOBAN 125X83 (DRAPES) ×1 IMPLANT
DRAPE U-SHAPE 47X51 STRL (DRAPES) ×2 IMPLANT
DRSG AQUACEL AG ADV 3.5X10 (GAUZE/BANDAGES/DRESSINGS) ×1 IMPLANT
DURAPREP 26ML APPLICATOR (WOUND CARE) ×2 IMPLANT
ELECT BLADE 4.0 EZ CLEAN MEGAD (MISCELLANEOUS) ×1
ELECT REM PT RETURN 9FT ADLT (ELECTROSURGICAL) ×1
ELECTRODE BLDE 4.0 EZ CLN MEGD (MISCELLANEOUS) ×1 IMPLANT
ELECTRODE REM PT RTRN 9FT ADLT (ELECTROSURGICAL) ×1 IMPLANT
GLOVE BIOGEL PI IND STRL 7.0 (GLOVE) ×2 IMPLANT
GLOVE BIOGEL PI IND STRL 7.5 (GLOVE) ×5 IMPLANT
GLOVE ECLIPSE 7.0 STRL STRAW (GLOVE) ×2 IMPLANT
GLOVE SKINSENSE STRL SZ7.5 (GLOVE) ×1 IMPLANT
GLOVE SURG SYN 7.5  E (GLOVE) ×2
GLOVE SURG SYN 7.5 E (GLOVE) ×2 IMPLANT
GLOVE SURG SYN 7.5 PF PI (GLOVE) ×2 IMPLANT
GLOVE SURG UNDER POLY LF SZ7 (GLOVE) ×1 IMPLANT
GLOVE SURG UNDER POLY LF SZ7.5 (GLOVE) ×2 IMPLANT
GOWN STRL REIN XL XLG (GOWN DISPOSABLE) ×1 IMPLANT
GOWN STRL REUS W/ TWL LRG LVL3 (GOWN DISPOSABLE) IMPLANT
GOWN STRL REUS W/ TWL XL LVL3 (GOWN DISPOSABLE) ×1 IMPLANT
GOWN STRL REUS W/TWL LRG LVL3 (GOWN DISPOSABLE)
GOWN STRL REUS W/TWL XL LVL3 (GOWN DISPOSABLE) ×1
HANDPIECE INTERPULSE COAX TIP (DISPOSABLE) ×1
HEAD M SROM 36MM PLUS 1.5 (Hips) IMPLANT
HOOD PEEL AWAY FLYTE STAYCOOL (MISCELLANEOUS) ×2 IMPLANT
IV NS IRRIG 3000ML ARTHROMATIC (IV SOLUTION) ×1 IMPLANT
JET LAVAGE IRRISEPT WOUND (IRRIGATION / IRRIGATOR) ×1
KIT BASIN OR (CUSTOM PROCEDURE TRAY) ×1 IMPLANT
LAVAGE JET IRRISEPT WOUND (IRRIGATION / IRRIGATOR) ×1 IMPLANT
LINER NEUTRAL 54X36MM PLUS 4 (Hips) IMPLANT
MARKER SKIN DUAL TIP RULER LAB (MISCELLANEOUS) ×1 IMPLANT
NDL SPNL 18GX3.5 QUINCKE PK (NEEDLE) ×1 IMPLANT
NEEDLE SPNL 18GX3.5 QUINCKE PK (NEEDLE) ×1 IMPLANT
PACK TOTAL JOINT (CUSTOM PROCEDURE TRAY) ×1 IMPLANT
PACK UNIVERSAL I (CUSTOM PROCEDURE TRAY) ×1 IMPLANT
RETRACTOR WND ALEXIS 18 MED (MISCELLANEOUS) IMPLANT
RTRCTR WOUND ALEXIS 18CM MED (MISCELLANEOUS)
SAW OSC TIP CART 19.5X105X1.3 (SAW) ×1 IMPLANT
SCREW 6.5MMX25MM (Screw) IMPLANT
SET HNDPC FAN SPRY TIP SCT (DISPOSABLE) ×1 IMPLANT
SROM M HEAD 36MM PLUS 1.5 (Hips) ×1 IMPLANT
STAPLER VISISTAT 35W (STAPLE) IMPLANT
STEM FEMORAL SZ6 HIGH ACTIS (Stem) IMPLANT
SUT ETHIBOND 2 V 37 (SUTURE) ×1 IMPLANT
SUT VIC AB 0 CT1 27 (SUTURE) ×1
SUT VIC AB 0 CT1 27XBRD ANBCTR (SUTURE) ×1 IMPLANT
SUT VIC AB 1 CTX 36 (SUTURE) ×1
SUT VIC AB 1 CTX36XBRD ANBCTR (SUTURE) ×1 IMPLANT
SUT VIC AB 2-0 CT1 27 (SUTURE) ×4
SUT VIC AB 2-0 CT1 TAPERPNT 27 (SUTURE) ×2 IMPLANT
SYR 50ML LL SCALE MARK (SYRINGE) ×1 IMPLANT
TOWEL GREEN STERILE (TOWEL DISPOSABLE) ×1 IMPLANT
TRAY CATH 16FR W/PLASTIC CATH (SET/KITS/TRAYS/PACK) IMPLANT
TRAY FOLEY W/BAG SLVR 16FR (SET/KITS/TRAYS/PACK) ×1
TRAY FOLEY W/BAG SLVR 16FR ST (SET/KITS/TRAYS/PACK) ×1 IMPLANT
YANKAUER SUCT BULB TIP NO VENT (SUCTIONS) ×1 IMPLANT

## 2022-11-21 NOTE — Op Note (Addendum)
LEFT TOTAL HIP ARTHROPLASTY ANTERIOR APPROACH  Procedure Note KENZLI BARRITT   [id number]  Pre-op Diagnosis: left hip degenerative joint disease     Post-op Diagnosis: same  Operative Findings Joint effusion, synovitis Complete loss of articular cartilage   Operative Procedures  1. Total hip replacement; Left hip; uncemented [id number]   Surgeon: Frankey Shown, M.D.  Assist: Madalyn Rob, PA-C   Anesthesia: general  Prosthesis: Depuy Acetabulum: Pinnacle 54 mm Femur: Actis 6 HO Head: 36 mm size: +1.5 Liner: +4 Bearing Type: metal/poly  Total Hip Arthroplasty (Anterior Approach) Op Note:  After informed consent was obtained and the operative extremity marked in the holding area, the patient was brought back to the operating room and placed supine on the HANA table. Next, the operative extremity was prepped and draped in normal sterile fashion. Surgical timeout occurred verifying patient identification, surgical site, surgical procedure and administration of antibiotics.  A Hueter approach to the hip was performed, using the interval between tensor fascia lata and sartorius.  Dissection was carried bluntly down onto the anterior hip capsule. The lateral femoral circumflex vessels were identified and coagulated. A capsulotomy was performed and the capsular flaps tagged for later repair.  The neck osteotomy was performed. The femoral head was removed which showed severe wear, the acetabular rim was cleared of soft tissue and osteophytes and attention was turned to reaming the acetabulum.  Sequential reaming was performed under fluoroscopic guidance down to the floor of the cotyloid fossa. We reamed to a size 53 mm, and then impacted the acetabular shell. A 25 mm cancellous screw was placed through the shell for added fixation.  The liner was then placed after irrigation and attention turned to the femur.  After placing the femoral hook, the leg was taken to externally rotated,  extended and adducted position taking care to perform soft tissue releases to allow for adequate mobilization of the femur. Soft tissue was cleared from the shoulder of the greater trochanter and the hook elevator used to improve exposure of the proximal femur. Sequential broaching performed up to a size 6. Trial neck and head were placed. The leg was brought back up to neutral and the construct reduced.  Antibiotic irrigation was placed in the surgical wound.  The position and sizing of components, offset and leg lengths were checked using fluoroscopy. Stability of the construct was checked in extension and external rotation without any subluxation, shucking or impingement of prosthesis. We dislocated the prosthesis, dropped the leg back into position, removed trial components, and irrigated copiously. The final stem and head was then placed, the leg brought back up, the system reduced and fluoroscopy used to verify positioning.  We irrigated, obtained hemostasis and closed the capsule using #2 ethibond suture.  One gram of vancomycin powder was placed in the surgical bed.   One gram of topical tranexamic acid was injected into the joint.  The fascia was closed with #1 vicryl plus, the deep fat layer was closed with 0 vicryl, the subcutaneous layers closed with 2.0 Vicryl Plus and the skin closed with 2.0 nylon and dermabond. A sterile dressing was applied. The patient was awakened in the operating room and taken to recovery in stable condition.  All sponge, needle, and instrument counts were correct at the end of the case.   Tawanna Cooler, my PA, was a medical necessity for opening, closing, limb positioning, retracting, exposing, and overall facilitation and timely completion of the surgery.  Position: supine  Complications: see description  of procedure.  Time Out: performed   Drains/Packing: none  Estimated blood loss: see anesthesia record  Returned to Recovery Room: in good condition.    Antibiotics: yes   Mechanical VTE (DVT) Prophylaxis: sequential compression devices, TED thigh-high  Chemical VTE (DVT) Prophylaxis: xarelto POD 1   Fluid Replacement: see anesthesia record  Specimens Removed: 1 to pathology   Sponge and Instrument Count Correct? yes   PACU: portable radiograph - low AP   Plan/RTC: Return in 2 weeks for staple removal. Weight Bearing/Load Lower Extremity: full  Hip precautions: none Suture Removal: 2 weeks   N. Eduard Roux, MD [facility] 10:29 AM   Implant Name Type Inv. Item Serial No. Manufacturer Lot No. LRB No. Used Action  SCREW 6.5MMX25MM - [id number] Screw SCREW 6.5MMX25MM  [employer] [id number] Left 1 Implanted  LINER NEUTRAL 54X36MM PLUS 4 - [id number] Hips LINER NEUTRAL 54X36MM PLUS 4  [employer] [id number] Left 1 Implanted  ACETAB CUP W/GRIPTION 54 - [id number] Plate ACETAB CUP W/GRIPTION 54  [employer] [id number] Left 1 Implanted  STEM FEM ACTIS HIGH SZ8 - [id number] Stem STEM FEM ACTIS HIGH SZ8  [employer] [id number] Left 1 Implanted  SROM M HEAD 36MM PLUS 1.5 - [id number] Hips SROM M HEAD 36MM PLUS 1.5  [employer] [id number] Left 1 Implanted

## 2022-11-21 NOTE — Transfer of Care (Signed)
Immediate Anesthesia Transfer of Care Note  Patient: [name]  Procedure(s) Performed: LEFT TOTAL HIP ARTHROPLASTY ANTERIOR APPROACH (Left: Hip)  Patient Location: PACU  Anesthesia Type:General  Level of Consciousness: awake and alert   Airway & Oxygen Therapy: Patient Spontanous Breathing and Patient connected to face mask oxygen  Post-op Assessment: Report given to RN and Post -op Vital signs reviewed and stable  Post vital signs: Reviewed and stable  Last Vitals:  Vitals Value Taken Time  BP 134/81 11/21/22 1103  Temp    Pulse 66 11/21/22 1106  Resp 20 11/21/22 1106  SpO2 97 % 11/21/22 1106  Vitals shown include unvalidated device data.  Last Pain:  Vitals:   11/21/22 0740  TempSrc:   PainSc: 0-No pain      Patients Stated Pain Goal: 3 (46/56/81 2751)  Complications: No notable events documented.

## 2022-11-21 NOTE — Evaluation (Signed)
Physical Therapy Evaluation Patient Details Name: ASLEY BASKERVILLE MRN: [id number] DOB: 1946/08/13 Today's Date: 11/21/2022  History of Present Illness  Pt is 76 yo female s/p L anterior THA on 11/21/22.  Pt with hx of R THA 2021, thyroid CA, arthritis, GERD, kidney stones, HTN, and hypothyroidism  Clinical Impression  Pt is s/p THA resulting in the deficits listed below (see PT Problem List). At baseline, pt lives alone and is independent. She has multiple friends and family that plan to stay with her at d/c and has DME.  Today, pt requiring min A for transfers and ambulated 5' with RW but further limited due to nausea.  Pt expected to progress well with therapy. Pt will benefit from skilled PT to increase their independence and safety with mobility to allow discharge to the venue listed below.         Recommendations for follow up therapy are one component of a multi-disciplinary discharge planning process, led by the attending physician.  Recommendations may be updated based on patient status, additional functional criteria and insurance authorization.  Follow Up Recommendations Follow physician's recommendations for discharge plan and follow up therapies      Assistance Recommended at Discharge Intermittent Supervision/Assistance  Patient can return home with the following  A little help with walking and/or transfers;A little help with bathing/dressing/bathroom;Assistance with cooking/housework;Help with stairs or ramp for entrance    Equipment Recommendations None recommended by PT  Recommendations for Other Services       Functional Status Assessment Patient has had a recent decline in their functional status and demonstrates the ability to make significant improvements in function in a reasonable and predictable amount of time.     Precautions / Restrictions Precautions Precautions: Fall Restrictions LLE Weight Bearing: Weight bearing as tolerated      Mobility  Bed  Mobility Overal bed mobility: Needs Assistance Bed Mobility: Supine to Sit, Sit to Supine     Supine to sit: Min assist Sit to supine: Min assist   General bed mobility comments: cues and min A for L LE    Transfers Overall transfer level: Needs assistance Equipment used: Rolling walker (2 wheels) Transfers: Sit to/from Stand, Bed to chair/wheelchair/BSC Sit to Stand: Min guard   Step pivot transfers: Min guard       General transfer comment: Cues for RW    Ambulation/Gait Ambulation/Gait assistance: Min guard Gait Distance (Feet): 5 Feet Assistive device: Rolling walker (2 wheels) Gait Pattern/deviations: Step-to pattern, Decreased stride length Gait velocity: decreased     General Gait Details: small steps at bedside; further limited due to nausea  Stairs            Wheelchair Mobility    Modified Rankin (Stroke Patients Only)       Balance Overall balance assessment: Needs assistance Sitting-balance support: No upper extremity supported Sitting balance-Leahy Scale: Good     Standing balance support: Bilateral upper extremity supported, Reliant on assistive device for balance Standing balance-Leahy Scale: Poor Standing balance comment: steady with RW                             Pertinent Vitals/Pain Pain Assessment Pain Assessment: 0-10 Pain Score: 5  Pain Location: L hip Pain Descriptors / Indicators: Discomfort    Home Living Family/patient expects to be discharged to:: Private residence Living Arrangements: Alone Available Help at Discharge: Family;Available 24 hours/day Type of Home: House Home Access: Level entry;Ramped entrance  Home Layout: One level Home Equipment: Advice worker (2 wheels);Cane - single point;BSC/3in1      Prior Function Prior Level of Function : Independent/Modified Independent;Driving             Mobility Comments: could ambulate in community; has used cane last month b/c  of hip pain ADLs Comments: independent with adls and iadl     Hand Dominance        Extremity/Trunk Assessment   Upper Extremity Assessment Upper Extremity Assessment: Overall WFL for tasks assessed    Lower Extremity Assessment Lower Extremity Assessment: LLE deficits/detail;RLE deficits/detail RLE Deficits / Details: ROM WFL; MMT 5/5 LLE Deficits / Details: Expected post op changes; ROM WFL; MMT: ankle 5/5, hip and knee 3/5    Cervical / Trunk Assessment Cervical / Trunk Assessment: Normal  Communication   Communication: No difficulties  Cognition Arousal/Alertness: Awake/alert Behavior During Therapy: WFL for tasks assessed/performed Overall Cognitive Status: Within Functional Limits for tasks assessed                                          General Comments General comments (skin integrity, edema, etc.): VSS    Exercises     Assessment/Plan    PT Assessment Patient needs continued PT services  PT Problem List Decreased strength;Pain;Decreased range of motion;Decreased activity tolerance;Decreased knowledge of use of DME;Decreased balance;Decreased mobility;Decreased knowledge of precautions       PT Treatment Interventions DME instruction;Therapeutic exercise;Gait training;Balance training;Stair training;Modalities;Functional mobility training;Therapeutic activities;Patient/family education    PT Goals (Current goals can be found in the Care Plan section)  Acute Rehab PT Goals Patient Stated Goal: return home PT Goal Formulation: With patient Time For Goal Achievement: 12/05/22 Potential to Achieve Goals: Good    Frequency 7X/week     Co-evaluation               AM-PAC PT "6 Clicks" Mobility  Outcome Measure Help needed turning from your back to your side while in a flat bed without using bedrails?: None Help needed moving from lying on your back to sitting on the side of a flat bed without using bedrails?: A Little Help needed  moving to and from a bed to a chair (including a wheelchair)?: A Little Help needed standing up from a chair using your arms (e.g., wheelchair or bedside chair)?: A Little Help needed to walk in hospital room?: A Little Help needed climbing 3-5 steps with a railing? : A Little 6 Click Score: 19    End of Session Equipment Utilized During Treatment: Gait belt Activity Tolerance: Patient tolerated treatment well Patient left: in bed;with call bell/phone within reach (no scd pump in room) Nurse Communication: Mobility status PT Visit Diagnosis: Other abnormalities of gait and mobility (R26.89);Muscle weakness (generalized) (M62.81)    Time: 4403-4742 PT Time Calculation (min) (ACUTE ONLY): 28 min   Charges:   PT Evaluation $PT Eval Low Complexity: 1 Low PT Treatments $Therapeutic Activity: 8-22 mins        Abran Richard, PT Acute Rehab [facility] [phone]   Karlton Lemon 11/21/2022, 4:26 PM

## 2022-11-21 NOTE — Plan of Care (Signed)

## 2022-11-21 NOTE — Anesthesia Postprocedure Evaluation (Signed)
Anesthesia Post Note  Patient: [name]  Procedure(s) Performed: LEFT TOTAL HIP ARTHROPLASTY ANTERIOR APPROACH (Left: Hip)     Patient location during evaluation: PACU Anesthesia Type: General Level of consciousness: awake and alert Pain management: pain level controlled Vital Signs Assessment: post-procedure vital signs reviewed and stable Respiratory status: spontaneous breathing, nonlabored ventilation, respiratory function stable and patient connected to nasal cannula oxygen Cardiovascular status: blood pressure returned to baseline and stable Postop Assessment: no apparent nausea or vomiting Anesthetic complications: no  No notable events documented.  Last Vitals:  Vitals:   11/21/22 1230 11/21/22 1249  BP: (!) 159/79 (!) 145/73  Pulse: (!) 58 62  Resp: 10   Temp: (!) 36.2 C (!) 36.4 C  SpO2: 99% 99%    Last Pain:  Vitals:   11/21/22 1249  TempSrc: Oral  PainSc: 5                  Barnet Glasgow

## 2022-11-21 NOTE — Discharge Instructions (Addendum)
INSTRUCTIONS AFTER JOINT REPLACEMENT   Remove items at home which could result in a fall. This includes throw rugs or furniture in walking pathways ICE to the affected joint every three hours while awake for 30 minutes at a time, for at least the first 3-5 days, and then as needed for pain and swelling.  Continue to use ice for pain and swelling. You may notice swelling that will progress down to the foot and ankle.  This is normal after surgery.  Elevate your leg when you are not up walking on it.   Continue to use the breathing machine you got in the hospital (incentive spirometer) which will help keep your temperature down.  It is common for your temperature to cycle up and down following surgery, especially at night when you are not up moving around and exerting yourself.  The breathing machine keeps your lungs expanded and your temperature down.   DIET:  As you were doing prior to hospitalization, we recommend a well-balanced diet.  DRESSING / WOUND CARE / SHOWERING  Keep the surgical dressing until follow up.  The dressing is water proof, so you can shower without any extra covering.  IF THE DRESSING FALLS OFF or the wound gets wet inside, change the dressing with sterile gauze.  Please use good hand washing techniques before changing the dressing.  Do not use any lotions or creams on the incision until instructed by your surgeon.    ACTIVITY  Increase activity slowly as tolerated, but follow the weight bearing instructions below.   No driving for 6 weeks or until further direction given by your physician.  You cannot drive while taking narcotics.  No lifting or carrying greater than 10 lbs. until further directed by your surgeon. Avoid periods of inactivity such as sitting longer than an hour when not asleep. This helps prevent blood clots.  You may return to work once you are authorized by your doctor.     WEIGHT BEARING   Weight bearing as tolerated with assist device (walker, cane,  etc) as directed, use it as long as suggested by your surgeon or therapist, typically at least 4-6 weeks.   EXERCISES  Results after joint replacement surgery are often greatly improved when you follow the exercise, range of motion and muscle strengthening exercises prescribed by your doctor. Safety measures are also important to protect the joint from further injury. Any time any of these exercises cause you to have increased pain or swelling, decrease what you are doing until you are comfortable again and then slowly increase them. If you have problems or questions, call your caregiver or physical therapist for advice.   Rehabilitation is important following a joint replacement. After just a few days of immobilization, the muscles of the leg can become weakened and shrink (atrophy).  These exercises are designed to build up the tone and strength of the thigh and leg muscles and to improve motion. Often times heat used for twenty to thirty minutes before working out will loosen up your tissues and help with improving the range of motion but do not use heat for the first two weeks following surgery (sometimes heat can increase post-operative swelling).   These exercises can be done on a training (exercise) mat, on the floor, on a table or on a bed. Use whatever works the best and is most comfortable for you.    Use music or television while you are exercising so that the exercises are a pleasant break in your   day. This will make your life better with the exercises acting as a break in your routine that you can look forward to.   Perform all exercises about fifteen times, three times per day or as directed.  You should exercise both the operative leg and the other leg as well.  Exercises include:   Quad Sets - Tighten up the muscle on the front of the thigh (Quad) and hold for 5-10 seconds.   Straight Leg Raises - With your knee straight (if you were given a brace, keep it on), lift the leg to 60  degrees, hold for 3 seconds, and slowly lower the leg.  Perform this exercise against resistance later as your leg gets stronger.  Leg Slides: Lying on your back, slowly slide your foot toward your buttocks, bending your knee up off the floor (only go as far as is comfortable). Then slowly slide your foot back down until your leg is flat on the floor again.  Angel Wings: Lying on your back spread your legs to the side as far apart as you can without causing discomfort.  Hamstring Strength:  Lying on your back, push your heel against the floor with your leg straight by tightening up the muscles of your buttocks.  Repeat, but this time bend your knee to a comfortable angle, and push your heel against the floor.  You may put a pillow under the heel to make it more comfortable if necessary.   A rehabilitation program following joint replacement surgery can speed recovery and prevent re-injury in the future due to weakened muscles. Contact your doctor or a physical therapist for more information on knee rehabilitation.    CONSTIPATION  Constipation is defined medically as fewer than three stools per week and severe constipation as less than one stool per week.  Even if you have a regular bowel pattern at home, your normal regimen is likely to be disrupted due to multiple reasons following surgery.  Combination of anesthesia, postoperative narcotics, change in appetite and fluid intake all can affect your bowels.   YOU MUST use at least one of the following options; they are listed in order of increasing strength to get the job done.  They are all available over the counter, and you may need to use some, POSSIBLY even all of these options:    Drink plenty of fluids (prune juice may be helpful) and high fiber foods Colace 100 mg by mouth twice a day  Senokot for constipation as directed and as needed Dulcolax (bisacodyl), take with full glass of water  Miralax (polyethylene glycol) once or twice a day as  needed.  If you have tried all these things and are unable to have a bowel movement in the first 3-4 days after surgery call either your surgeon or your primary doctor.    If you experience loose stools or diarrhea, hold the medications until you stool forms back up.  If your symptoms do not get better within 1 week or if they get worse, check with your doctor.  If you experience "the worst abdominal pain ever" or develop nausea or vomiting, please contact the office immediately for further recommendations for treatment.   ITCHING:  If you experience itching with your medications, try taking only a single pain pill, or even half a pain pill at a time.  You can also use Benadryl over the counter for itching or also to help with sleep.   TED HOSE STOCKINGS:  Use stockings on both   legs until for at least 2 weeks or as directed by physician office. They may be removed at night for sleeping.  MEDICATIONS:  See your medication summary on the "After Visit Summary" that nursing will review with you.  You may have some home medications which will be placed on hold until you complete the course of blood thinner medication.  It is important for you to complete the blood thinner medication as prescribed.  PRECAUTIONS:  If you experience chest pain or shortness of breath - call 911 immediately for transfer to the hospital emergency department.   If you develop a fever greater that 101 F, purulent drainage from wound, increased redness or drainage from wound, foul odor from the wound/dressing, or calf pain - CONTACT YOUR SURGEON.                                                   FOLLOW-UP APPOINTMENTS:  If you do not already have a post-op appointment, please call the office for an appointment to be seen by your surgeon.  Guidelines for how soon to be seen are listed in your "After Visit Summary", but are typically between 1-4 weeks after surgery.  OTHER INSTRUCTIONS:   Knee Replacement:  Do not place pillow  under knee, focus on keeping the knee straight while resting. CPM instructions: 0-90 degrees, 2 hours in the morning, 2 hours in the afternoon, and 2 hours in the evening. Place foam block, curve side up under heel at all times except when in CPM or when walking.  DO NOT modify, tear, cut, or change the foam block in any way.  POST-OPERATIVE OPIOID TAPER INSTRUCTIONS: It is important to wean off of your opioid medication as soon as possible. If you do not need pain medication after your surgery it is ok to stop day one. Opioids include: Codeine, Hydrocodone(Norco, Vicodin), Oxycodone(Percocet, oxycontin) and hydromorphone amongst others.  Long term and even short term use of opiods can cause: Increased pain response Dependence Constipation Depression Respiratory depression And more.  Withdrawal symptoms can include Flu like symptoms Nausea, vomiting And more Techniques to manage these symptoms Hydrate well Eat regular healthy meals Stay active Use relaxation techniques(deep breathing, meditating, yoga) Do Not substitute Alcohol to help with tapering If you have been on opioids for less than two weeks and do not have pain than it is ok to stop all together.  Plan to wean off of opioids This plan should start within one week post op of your joint replacement. Maintain the same interval or time between taking each dose and first decrease the dose.  Cut the total daily intake of opioids by one tablet each day Next start to increase the time between doses. The last dose that should be eliminated is the evening dose.   MAKE SURE YOU:  Understand these instructions.  Get help right away if you are not doing well or get worse.    Thank you for letting us be a part of your medical care team.  It is a privilege we respect greatly.  We hope these instructions will help you stay on track for a fast and full recovery!     Information on my medicine - XARELTO (Rivaroxaban)  This  medication education was reviewed with me or my healthcare representative as part of my discharge preparation.      Why was Xarelto prescribed for you? Xarelto was prescribed for you to reduce the risk of blood clots forming after orthopedic surgery. The medical term for these abnormal blood clots is venous thromboembolism (VTE).  What do you need to know about xarelto ? Take your Xarelto ONCE DAILY at the same time every day. You may take it either with or without food.  If you have difficulty swallowing the tablet whole, you may crush it and mix in applesauce just prior to taking your dose.  Take Xarelto exactly as prescribed by your doctor and DO NOT stop taking Xarelto without talking to the doctor who prescribed the medication.  Stopping without other VTE prevention medication to take the place of Xarelto may increase your risk of developing a clot.  After discharge, you should have regular check-up appointments with your healthcare provider that is prescribing your Xarelto.    What do you do if you miss a dose? If you miss a dose, take it as soon as you remember on the same day then continue your regularly scheduled once daily regimen the next day. Do not take two doses of Xarelto on the same day.   Important Safety Information A possible side effect of Xarelto is bleeding. You should call your healthcare provider right away if you experience any of the following: Bleeding from an injury or your nose that does not stop. Unusual colored urine (red or dark brown) or unusual colored stools (red or black). Unusual bruising for unknown reasons. A serious fall or if you hit your head (even if there is no bleeding).  Some medicines may interact with Xarelto and might increase your risk of bleeding while on Xarelto. To help avoid this, consult your healthcare provider or pharmacist prior to using any new prescription or non-prescription medications, including herbals, vitamins,  non-steroidal anti-inflammatory drugs (NSAIDs) and supplements.  This website has more information on Xarelto: www.xarelto.com.   

## 2022-11-21 NOTE — Anesthesia Procedure Notes (Signed)
Procedure Name: Intubation Date/Time: 11/21/2022 9:07 AM  Performed by: Wilburn Cornelia, CRNAPre-anesthesia Checklist: Patient identified, Emergency Drugs available, Suction available, Patient being monitored and Timeout performed Patient Re-evaluated:Patient Re-evaluated prior to induction Oxygen Delivery Method: Circle system utilized Preoxygenation: Pre-oxygenation with 100% oxygen Induction Type: IV induction Ventilation: Mask ventilation without difficulty Laryngoscope Size: Mac and 3 Grade View: Grade II Tube type: Oral Tube size: 7.0 mm Number of attempts: 1 Airway Equipment and Method: Stylet Placement Confirmation: ETT inserted through vocal cords under direct vision, positive ETCO2, CO2 detector and breath sounds checked- equal and bilateral Secured at: 21 cm Tube secured with: Tape Dental Injury: Teeth and Oropharynx as per pre-operative assessment

## 2022-11-21 NOTE — H&P (Signed)
PREOPERATIVE H&P  Chief Complaint: left hip degenerative joint disease  HPI: [name] is a 76 y.o. female who presents for surgical treatment of left hip degenerative joint disease.  She denies any changes in medical history.  Past Medical History:  Diagnosis Date   Arthritis    Cancer [facility])    Thyroid cancer   COVID-19 virus infection 12/2020   Family history of adverse reaction to anesthesia    mother "couldn't wake up good" and would throw up   GERD (gastroesophageal reflux disease)    Heart murmur    History of kidney stones    Hypercholesteremia    resolved after gastric bypass   Hypertension    Hypothyroidism    Past Surgical History:  Procedure Laterality Date   ABDOMINAL HYSTERECTOMY     BILATERAL SALPINGOOPHORECTOMY     BREAST EXCISIONAL BIOPSY Bilateral 1995   benign   CHOLECYSTECTOMY     COLONOSCOPY  11/14/2011   Procedure: COLONOSCOPY;  Surgeon: Daneil Dolin, MD;  Location: [facility];  Service: Endoscopy;  Laterality: N/A;   REPLACEMENT TOTAL KNEE BILATERAL     ROUX-EN-Y PROCEDURE  2007   TONSILLECTOMY     TOTAL HIP ARTHROPLASTY Right 12/23/2019   Procedure: RIGHT TOTAL HIP ARTHROPLASTY ANTERIOR APPROACH;  Surgeon: Leandrew Koyanagi, MD;  Location: [place];  Service: Orthopedics;  Laterality: Right;   TOTAL THYROIDECTOMY  12/2010   TUBAL LIGATION     Social History   Socioeconomic History   Marital status: Widowed    Spouse name: Not on file   Number of children: Not on file   Years of education: Not on file   Highest education level: Not on file  Occupational History   Occupation: Retired Therapist, art  Tobacco Use   Smoking status: Never   Smokeless tobacco: Never  Vaping Use   Vaping Use: Never used  Substance and Sexual Activity   Alcohol use: No   Drug use: No   Sexual activity: Not on file  Other Topics Concern   Not on file  Social History Narrative   Not on file   Social Determinants of Health   Financial Resource  Strain: Low Risk  (02/15/2022)   Overall Financial Resource Strain (CARDIA)    Difficulty of Paying Living Expenses: Not very hard  Food Insecurity: No Food Insecurity (02/15/2022)   Hunger Vital Sign    Worried About Running Out of Food in the Last Year: Never true    Ran Out of Food in the Last Year: Never true  Transportation Needs: No Transportation Needs (02/15/2022)   PRAPARE - Hydrologist (Medical): No    Lack of Transportation (Non-Medical): No  Physical Activity: Insufficiently Active (02/15/2022)   Exercise Vital Sign    Days of Exercise per Week: 1 day    Minutes of Exercise per Session: 10 min  Stress: No Stress Concern Present (02/15/2022)   [place]    Feeling of Stress : Not at all  Social Connections: Moderately Integrated (02/15/2022)   Social Connection and Isolation Panel [NHANES]    Frequency of Communication with Friends and Family: More than three times a week    Frequency of Social Gatherings with Friends and Family: Three times a week    Attends Religious Services: More than 4 times per year    Active Member of Clubs or Organizations: Yes    Attends Club or  Organization Meetings: More than 4 times per year    Marital Status: Widowed   Family History  Problem Relation Age of Onset   Breast cancer Mother    Pneumonia Mother 63   CAD Father 12   Breast cancer Sister    CAD Sister 33   Other Maternal Grandmother 65   CAD Brother 46   Other Brother 54       drug overdose   CAD Other 32   Colon cancer Neg Hx    Allergies  Allergen Reactions   Codeine Nausea And Vomiting   Demerol Nausea And Vomiting   Nsaids Other (See Comments)    Due to gastric bypass   Quinine Other (See Comments)    Unknown reaction type (patient is unable to recall reaction type)   Prior to Admission medications   Medication Sig Start Date End Date Taking? Authorizing Provider  acetaminophen  (TYLENOL) 500 MG tablet Take 1,000 mg by mouth 2 (two) times daily as needed (for pain.).    [provider]  amoxicillin (AMOXIL) 500 MG capsule Take 2,000 mg by mouth See admin instructions. Take 4 capsules (2000 mg) by mouth 1 hour prior to dental appointments    [provider]  Calcium Carb-Cholecalciferol (OYSTER SHELL CALCIUM/D3 PO) Take 1 tablet by mouth in the morning and at bedtime.    [provider]  carboxymethylcellulose (REFRESH PLUS) 0.5 % SOLN Place 1 drop into both eyes in the morning and at bedtime.    [provider]  docusate sodium (COLACE) 100 MG capsule Take 1 capsule (100 mg total) by mouth daily as needed. 10/28/22 10/28/23  Aundra Dubin, PA-C  enalapril (VASOTEC) 10 MG tablet Take 1 tablet (10 mg total) by mouth at bedtime. 05/11/22   Coral Spikes, DO  famotidine (PEPCID) 40 MG tablet TAKE 1 TABLET DAILY (CANCELRANTITIDINE) Patient taking differently: Take 40 mg by mouth at bedtime. 05/11/22   Coral Spikes, DO  ferrous sulfate 325 (65 FE) MG tablet Take 325 mg by mouth daily in the afternoon.    [provider]  levothyroxine (SYNTHROID) 150 MCG tablet Take 1 tablet (150 mcg total) by mouth daily before breakfast. 12/29/16   Rosita Fire, MD  MAGNESIUM PO Take 1 tablet by mouth daily as needed (leg/feet cramps).    [provider]  methocarbamol (ROBAXIN-750) 750 MG tablet Take 1 tablet (750 mg total) by mouth 2 (two) times daily as needed for muscle spasms. 10/28/22   Aundra Dubin, PA-C  Multiple Vitamin (MULTIVITAMIN WITH MINERALS) TABS tablet Take 1 tablet by mouth in the morning and at bedtime.    [provider]  Multiple Vitamins-Minerals (ZINC PO) Take 1 tablet by mouth daily as needed (leg/feet cramps).    [provider]  ondansetron (ZOFRAN) 4 MG tablet Take 1 tablet (4 mg total) by mouth every 8 (eight) hours as needed for nausea or vomiting. 10/28/22   Aundra Dubin, PA-C   oxyCODONE-acetaminophen (PERCOCET) 5-325 MG tablet Take 1 tablet by mouth every 8 (eight) hours as needed. To be taken after surgery 10/28/22   Aundra Dubin, PA-C  rivaroxaban (XARELTO) 10 MG TABS tablet Take 1 tablet (10 mg total) by mouth daily. To be taken after surgery to prevent blood clots 11/14/22   Aundra Dubin, PA-C  valACYclovir (VALTREX) 1000 MG tablet TAKE 2 TABLETS AT FIRST SIGN OF FEVER BLISTER, THEN 2 TABLETS 2 HOURSLATER Patient taking differently: Take 2,000 mg by  mouth 2 (two) times daily as needed (fever blisters.). 05/11/22   Coral Spikes, DO  vitamin B-12 (CYANOCOBALAMIN) 500 MCG tablet Take 500 mcg by mouth daily in the afternoon.    [provider]  VITAMIN D PO Take 2,000 Units by mouth in the morning and at bedtime.    [provider]     Positive ROS: All other systems have been reviewed and were otherwise negative with the exception of those mentioned in the HPI and as above.  Physical Exam: General: Alert, no acute distress Cardiovascular: No pedal edema Respiratory: No cyanosis, no use of accessory musculature GI: abdomen soft Skin: No lesions in the area of chief complaint Neurologic: Sensation intact distally Psychiatric: Patient is competent for consent with normal mood and affect Lymphatic: no lymphedema  MUSCULOSKELETAL: exam stable  Assessment: left hip degenerative joint disease  Plan: Plan for Procedure(s): LEFT TOTAL HIP ARTHROPLASTY ANTERIOR APPROACH  The risks benefits and alternatives were discussed with the patient including but not limited to the risks of nonoperative treatment, versus surgical intervention including infection, bleeding, nerve injury,  blood clots, cardiopulmonary complications, morbidity, mortality, among others, and they were willing to proceed.   Eduard Roux, MD 11/21/2022 7:05 AM

## 2022-11-21 NOTE — Anesthesia Procedure Notes (Signed)
Arterial Line Insertion Start/End12/10/2022 8:30 AM, 11/21/2022 8:35 AM Performed by: Wilburn Cornelia, CRNA, CRNA  Patient location: Pre-op. Preanesthetic checklist: patient identified, IV checked, site marked, risks and benefits discussed, surgical consent, monitors and equipment checked, pre-op evaluation, timeout performed and anesthesia consent Lidocaine 1% used for infiltration Left, radial was placed Catheter size: 20 G Hand hygiene performed  and maximum sterile barriers used  Allen's test indicative of satisfactory collateral circulation Attempts: 1 Procedure performed without using ultrasound guided technique. Following insertion, dressing applied and Biopatch. Post procedure assessment: normal  Post procedure complications: local hematoma. Patient tolerated the procedure well with no immediate complications.

## 2022-11-22 ENCOUNTER — Encounter: Payer: Medicare HMO | Admitting: Orthopaedic Surgery

## 2022-11-22 DIAGNOSIS — I10 Essential (primary) hypertension: Secondary | ICD-10-CM | POA: Diagnosis not present

## 2022-11-22 DIAGNOSIS — Z96653 Presence of artificial knee joint, bilateral: Secondary | ICD-10-CM | POA: Diagnosis not present

## 2022-11-22 DIAGNOSIS — Z85850 Personal history of malignant neoplasm of thyroid: Secondary | ICD-10-CM | POA: Diagnosis not present

## 2022-11-22 DIAGNOSIS — Z8616 Personal history of COVID-19: Secondary | ICD-10-CM | POA: Diagnosis not present

## 2022-11-22 DIAGNOSIS — E039 Hypothyroidism, unspecified: Secondary | ICD-10-CM | POA: Diagnosis not present

## 2022-11-22 DIAGNOSIS — Z79899 Other long term (current) drug therapy: Secondary | ICD-10-CM | POA: Diagnosis not present

## 2022-11-22 DIAGNOSIS — Z7901 Long term (current) use of anticoagulants: Secondary | ICD-10-CM | POA: Diagnosis not present

## 2022-11-22 DIAGNOSIS — M659 Synovitis and tenosynovitis, unspecified: Secondary | ICD-10-CM | POA: Diagnosis not present

## 2022-11-22 DIAGNOSIS — M1612 Unilateral primary osteoarthritis, left hip: Secondary | ICD-10-CM | POA: Diagnosis not present

## 2022-11-22 DIAGNOSIS — Z96641 Presence of right artificial hip joint: Secondary | ICD-10-CM | POA: Diagnosis not present

## 2022-11-22 LAB — CBC
HCT: 29 % — ABNORMAL LOW (ref 36.0–46.0)
Hemoglobin: 9.4 g/dL — ABNORMAL LOW (ref 12.0–15.0)
MCH: 30.3 pg (ref 26.0–34.0)
MCHC: 32.4 g/dL (ref 30.0–36.0)
MCV: 93.5 fL (ref 80.0–100.0)
Platelets: 183 10*3/uL (ref 150–400)
RBC: 3.1 MIL/uL — ABNORMAL LOW (ref 3.87–5.11)
RDW: 14.3 % (ref 11.5–15.5)
WBC: 7.9 10*3/uL (ref 4.0–10.5)
nRBC: 0 % (ref 0.0–0.2)

## 2022-11-22 NOTE — Progress Notes (Signed)
Physical Therapy Treatment Patient Details Name: [name] MRN: [id number] DOB: November 02, 1946 Today's Date: 11/22/2022   History of Present Illness Pt is 76 yo female s/p L anterior THA on 11/21/22.  Pt with hx of R THA 2021, thyroid CA, arthritis, GERD, kidney stones, HTN, and hypothyroidism    PT Comments    PM session: Pt with continued progress towards acute goals with session focused on gait for increased activity tolerance and safe stair negotiation for ultimate safe d/c to home. Pt demonstrating increased gait tolerance this session with RW and min guard however pt continues to require cues for safety. Pt able to ascend/descend 2 steps in therapy gym without fault, with close min guard for safety and cues for sequencing during descent. Pt able to demo all LE HEP exercises and pt verbalizing understanding of education on compliance and importance and benefits of continued mobility. Anticipate safe discharge with family support once medically cleared, will follow acutely.    Recommendations for follow up therapy are one component of a multi-disciplinary discharge planning process, led by the attending physician.  Recommendations may be updated based on patient status, additional functional criteria and insurance authorization.  Follow Up Recommendations  Follow physician's recommendations for discharge plan and follow up therapies     Assistance Recommended at Discharge Intermittent Supervision/Assistance  Patient can return home with the following A little help with walking and/or transfers;A little help with bathing/dressing/bathroom;Assistance with cooking/housework;Help with stairs or ramp for entrance   Equipment Recommendations  None recommended by PT    Recommendations for Other Services       Precautions / Restrictions Precautions Precautions: Fall Restrictions LLE Weight Bearing: Weight bearing as tolerated Other Position/Activity Restrictions: limit abduction, no  abduction therex supine or standing (per RN surgical team)     Mobility  Bed Mobility Overal bed mobility: Needs Assistance Bed Mobility: Supine to Sit     Supine to sit: Min guard     General bed mobility comments: pt OOB pre and post session    Transfers Overall transfer level: Needs assistance Equipment used: Rolling walker (2 wheels) Transfers: Sit to/from Stand, Bed to chair/wheelchair/BSC Sit to Stand: Min guard, Min assist           General transfer comment: min assist from recliner on initial stand as pt endorsing stiffness from sitting    Ambulation/Gait Ambulation/Gait assistance: Min guard Gait Distance (Feet): 100 Feet Assistive device: Rolling walker (2 wheels) Gait Pattern/deviations: Step-to pattern, Decreased stride length Gait velocity: decreased     General Gait Details: antalgic step to gait with RW, no LOB, continued cues to keep RW in contact with floor   Stairs Stairs: Yes Stairs assistance: Min guard, Min assist (asssit for cues for sequencing) Stair Management: Two rails, Step to pattern, Forwards Number of Stairs: 2 General stair comments: up/down steps in therapy gym without fault. cues for sequencing on descent no LOB   Wheelchair Mobility    Modified Rankin (Stroke Patients Only)       Balance Overall balance assessment: Needs assistance Sitting-balance support: No upper extremity supported Sitting balance-Leahy Scale: Good     Standing balance support: Bilateral upper extremity supported, Reliant on assistive device for balance Standing balance-Leahy Scale: Poor Standing balance comment: steady with RW                            Cognition Arousal/Alertness: Awake/alert Behavior During Therapy: WFL for tasks assessed/performed Overall Cognitive Status:  Within Functional Limits for tasks assessed                                          Exercises Total Joint Exercises Ankle Circles/Pumps:  AROM, Left, 10 reps Quad Sets: AROM, Left, 10 reps Heel Slides: AROM, AAROM, Left, 5 reps Other Exercises Other Exercises: pt verbalizing understanding of all HEP exercises and importance of complaince    General Comments General comments (skin integrity, edema, etc.): VSS on RA, daughter present and supportive, issued gait belt for pt use as leg liftier for ease of bed mobility      Pertinent Vitals/Pain Pain Assessment Pain Assessment: Faces Faces Pain Scale: Hurts a little bit Pain Location: L hip Pain Descriptors / Indicators: Discomfort Pain Intervention(s): Monitored during session, Limited activity within patient's tolerance    Home Living                          Prior Function            PT Goals (current goals can now be found in the care plan section) Acute Rehab PT Goals PT Goal Formulation: With patient Time For Goal Achievement: 12/05/22 Progress towards PT goals: Progressing toward goals    Frequency    7X/week      PT Plan      Co-evaluation              AM-PAC PT "6 Clicks" Mobility   Outcome Measure  Help needed turning from your back to your side while in a flat bed without using bedrails?: None Help needed moving from lying on your back to sitting on the side of a flat bed without using bedrails?: A Little Help needed moving to and from a bed to a chair (including a wheelchair)?: A Little Help needed standing up from a chair using your arms (e.g., wheelchair or bedside chair)?: A Little Help needed to walk in hospital room?: A Little Help needed climbing 3-5 steps with a railing? : A Little 6 Click Score: 19    End of Session Equipment Utilized During Treatment: Gait belt Activity Tolerance: Patient tolerated treatment well Patient left: in chair;with call bell/phone within reach;with family/visitor present Nurse Communication: Mobility status PT Visit Diagnosis: Other abnormalities of gait and mobility (R26.89);Muscle  weakness (generalized) (M62.81)     Time: 7939-0300 PT Time Calculation (min) (ACUTE ONLY): 21 min  Charges:  $Gait Training: 8-22 mins                     Nashya Garlington R. PTA Acute Rehabilitation Services Office: [place] 11/22/2022, 2:44 PM

## 2022-11-22 NOTE — TOC Initial Note (Addendum)
Transition of Care [facility]) - Initial/Assessment Note    Patient Details  Name: [name] MRN: [id number] Date of Birth: 10/11/1946  Transition of [facility]) CM/SW Contact:    Sharin Mons, RN Phone Number: 11/22/2022, 10:04 AM  Clinical Narrative:                    - s/p L  THA on 11/21/22  From home alone with good family support, daughters/ granddaughters. Pt states family  to assist with care once d/c.PTA home health services were arranged by provider's office with [facility] for [place] [facility].  Orders noted for RW and BSC,  pt states has DME @ home. Pt without RX med  and transportation concerns. Daughter to provide transportation to home once d/c ready, clearance from PT pending. Post hospital f/u noted on AVS. No present needs identified by NCM. TOC team  will continue to monitor...  Expected Discharge Plan: [place] Services Barriers to Discharge: Other (must enter comment) (therapist clearance pending)   Patient Goals and CMS Choice     Choice offered to / list presented to : Patient  Expected Discharge Plan and Services Expected Discharge Plan: [place]   Discharge Planning Services: CM Consult                               HH Arranged: PT [facility] Agency: [place] Date [place]: 11/22/22   Representative spoke with at [place]: Claiborne Billings  Prior Living Arrangements/Services     Patient language and need for interpreter reviewed:: Yes Do you feel safe going back to the place where you live?: Yes      Need for Family Participation in Patient Care: Yes (Comment) Care giver support system in place?: Yes (comment)   Criminal Activity/Legal Involvement Pertinent to Current Situation/Hospitalization: No - Comment as needed  Activities of Daily Living Home Assistive Devices/Equipment: Cane (specify quad or straight) ADL Screening (condition at time of admission) Patient's cognitive ability  adequate to safely complete daily activities?: Yes Is the patient deaf or have difficulty hearing?: No Does the patient have difficulty seeing, even when wearing glasses/contacts?: Yes Does the patient have difficulty concentrating, remembering, or making decisions?: No Patient able to express need for assistance with ADLs?: Yes Does the patient have difficulty dressing or bathing?: No Independently performs ADLs?: Yes (appropriate for developmental age) Does the patient have difficulty walking or climbing stairs?: Yes Weakness of Legs: Left Weakness of Arms/Hands: None  Permission Sought/Granted                  Emotional Assessment Appearance:: Appears stated age Attitude/Demeanor/Rapport: Engaged Affect (typically observed): Accepting Orientation: : Oriented to Self, Oriented to Place, Oriented to  Time, Oriented to Situation Alcohol / Substance Use: Not Applicable Psych Involvement: No (comment)  Admission diagnosis:  Status post total replacement of left hip [Z96.642] Patient Active Problem List   Diagnosis Date Noted   Status post total replacement of left hip 11/21/2022   MR (mitral regurgitation) 11/08/2022   Murmur, cardiac 11/01/2022   GERD (gastroesophageal reflux disease) 05/11/2022   Urge incontinence 02/21/2022   B12 deficiency 11/10/2021   Vitamin D deficiency 11/10/2021   Primary osteoarthritis of left hip 12/22/2020   Status post total hip replacement, right 12/22/2020   History of gastric bypass 11/02/2020   History of thyroid cancer 10/22/2020   Hypothyroidism  05/22/2016   Essential hypertension, benign 09/09/2013   Hyperlipidemia 09/09/2013   PCP:  Coral Spikes, DO Pharmacy:   [place], [place] - [address] [place] [address] [place] [address] [place] [address] Phone: [phone] Fax: [phone]  [facility] [place] [place] [facility] [address] [employer] [address] [place] [address] Phone: [phone] Fax: [phone]     Social Determinants of Health (SDOH) Interventions    Readmission Risk Interventions     No data to display

## 2022-11-22 NOTE — Progress Notes (Signed)
Physical Therapy Treatment Patient Details Name: [name] MRN: [id number] DOB: [birth year] Today's Date: 11/22/2022   History of Present Illness Pt is 76 yo female s/p L anterior THA on 11/21/22.  Pt with hx of R THA 2021, thyroid CA, arthritis, GERD, kidney stones, HTN, and hypothyroidism    PT Comments    AM session: Pt greeted supine in bed and agreeable to session with continued progress towards acute goals. Pt grossly min guard for bed mobility, transfers and gait with RW with pt demonstrating increased tolerance for gait this date with no c/o dizziness however pt with mild c/o nausea with increased gait distance, resolving with seated rest. Cues needed intermittently for safety awareness, RW proximity and maintaining RW in contact with ground with pt demonstrating good carry through. Pt able to complete ADLs standing at sink with min guard for safety. Plan to continue to progress activity tolerance and HEP in PM session. Pt continues to benefit from skilled PT services to progress toward functional mobility goals.    Recommendations for follow up therapy are one component of a multi-disciplinary discharge planning process, led by the attending physician.  Recommendations may be updated based on patient status, additional functional criteria and insurance authorization.  Follow Up Recommendations  Follow physician's recommendations for discharge plan and follow up therapies     Assistance Recommended at Discharge Intermittent Supervision/Assistance  Patient can return home with the following A little help with walking and/or transfers;A little help with bathing/dressing/bathroom;Assistance with cooking/housework;Help with stairs or ramp for entrance   Equipment Recommendations  None recommended by PT    Recommendations for Other Services       Precautions / Restrictions Precautions Precautions: Fall Restrictions LLE Weight Bearing: Weight bearing as tolerated Other  Position/Activity Restrictions: limit abduction, no abduction therex supine or standing (per RN surgical team)     Mobility  Bed Mobility Overal bed mobility: Needs Assistance Bed Mobility: Supine to Sit     Supine to sit: Min guard     General bed mobility comments: cues for LLE,increased time and use of rail, no physical assist needed    Transfers Overall transfer level: Needs assistance Equipment used: Rolling walker (2 wheels) Transfers: Sit to/from Stand, Bed to chair/wheelchair/BSC Sit to Stand: Min guard, Min assist           General transfer comment: min assist from EOB at lowest height, min guard from [employer], and recliner x1    Ambulation/Gait Ambulation/Gait assistance: Min guard Gait Distance (Feet): 55 Feet Assistive device: Rolling walker (2 wheels) Gait Pattern/deviations: Step-to pattern, Decreased stride length Gait velocity: decreased     General Gait Details: antalgic step to gait with RW, no LOB, cues at start to keep RW in contact with floor at all times, fair safety awareness,   Stairs             Wheelchair Mobility    Modified Rankin (Stroke Patients Only)       Balance Overall balance assessment: Needs assistance Sitting-balance support: No upper extremity supported Sitting balance-Leahy Scale: Good     Standing balance support: Bilateral upper extremity supported, Reliant on assistive device for balance Standing balance-Leahy Scale: Poor Standing balance comment: steady with RW                            Cognition Arousal/Alertness: Awake/alert Behavior During Therapy: WFL for tasks assessed/performed Overall Cognitive Status: Within Functional Limits for tasks assessed  Exercises      General Comments General comments (skin integrity, edema, etc.): VSS on RA, no c/o dizziness, mild c/o nausea with increased gait distance, HEP given with pt  verbalizing understanding and plan to review in PM session      Pertinent Vitals/Pain Pain Assessment Pain Assessment: Faces Faces Pain Scale: Hurts little more Pain Location: L hip Pain Descriptors / Indicators: Discomfort Pain Intervention(s): Monitored during session, Limited activity within patient's tolerance, Repositioned    Home Living                          Prior Function            PT Goals (current goals can now be found in the care plan section) Acute Rehab PT Goals Patient Stated Goal: to be able to attend Christmas program at church Sunday PT Goal Formulation: With patient Time For Goal Achievement: 12/05/22 Progress towards PT goals: Progressing toward goals    Frequency    7X/week      PT Plan      Co-evaluation              AM-PAC PT "6 Clicks" Mobility   Outcome Measure  Help needed turning from your back to your side while in a flat bed without using bedrails?: None Help needed moving from lying on your back to sitting on the side of a flat bed without using bedrails?: A Little Help needed moving to and from a bed to a chair (including a wheelchair)?: A Little Help needed standing up from a chair using your arms (e.g., wheelchair or bedside chair)?: A Little Help needed to walk in hospital room?: A Little Help needed climbing 3-5 steps with a railing? : A Little 6 Click Score: 19    End of Session Equipment Utilized During Treatment: Gait belt Activity Tolerance: Patient tolerated treatment well Patient left: Other (comment);with nursing/sitter in room (in BR with RN present, pt verblaizing understanding to pull cord when finished) Nurse Communication: Mobility status PT Visit Diagnosis: Other abnormalities of gait and mobility (R26.89);Muscle weakness (generalized) (M62.81)     Time: 6578-4696 PT Time Calculation (min) (ACUTE ONLY): 27 min  Charges:  $Gait Training: 8-22 mins $Therapeutic Activity: 8-22 mins                     Jacelyn Cuen R. PTA Acute Rehabilitation Services Office: [place] 11/22/2022, 9:35 AM

## 2022-11-22 NOTE — Progress Notes (Signed)
   Subjective:  Patient reports pain as mild.  Main complaint is nausea yesterday.  Better today.  Currently doing morning PT session.   Objective:   VITALS:   Vitals:   11/21/22 2055 11/21/22 2343 11/22/22 0400 11/22/22 0755  BP: (!) 148/82 124/73 124/65 122/62  Pulse: 74 77 73 71  Resp: '18 18 18 16  '$ Temp:    98 F (36.7 C)  TempSrc: Oral     SpO2: 100% 98% 97% 97%  Weight:      Height:        Neurovascular intact Sensation intact distally Intact pulses distally Dorsiflexion/Plantar flexion intact Incision: dressing C/D/I and no drainage   Lab Results  Component Value Date   WBC 7.9 11/22/2022   HGB 9.4 (L) 11/22/2022   HCT 29.0 (L) 11/22/2022   MCV 93.5 11/22/2022   PLT 183 11/22/2022     Assessment/Plan:  1 Day Post-Op   - Expected postop acute blood loss anemia - Up with PT/OT - DVT ppx - SCDs, ambulation, xarelto to start this morning - WBAT operative extremity - Pain control - Discharge planning - anticipate will be able to d/c home today or tomorrow depending on how PT goes  Eduard Roux 11/22/2022, 9:19 AM

## 2022-11-23 DIAGNOSIS — Z96641 Presence of right artificial hip joint: Secondary | ICD-10-CM | POA: Diagnosis not present

## 2022-11-23 DIAGNOSIS — Z79899 Other long term (current) drug therapy: Secondary | ICD-10-CM | POA: Diagnosis not present

## 2022-11-23 DIAGNOSIS — M1612 Unilateral primary osteoarthritis, left hip: Secondary | ICD-10-CM | POA: Diagnosis not present

## 2022-11-23 DIAGNOSIS — M659 Synovitis and tenosynovitis, unspecified: Secondary | ICD-10-CM | POA: Diagnosis not present

## 2022-11-23 DIAGNOSIS — Z7901 Long term (current) use of anticoagulants: Secondary | ICD-10-CM | POA: Diagnosis not present

## 2022-11-23 DIAGNOSIS — Z85850 Personal history of malignant neoplasm of thyroid: Secondary | ICD-10-CM | POA: Diagnosis not present

## 2022-11-23 DIAGNOSIS — Z8616 Personal history of COVID-19: Secondary | ICD-10-CM | POA: Diagnosis not present

## 2022-11-23 DIAGNOSIS — E039 Hypothyroidism, unspecified: Secondary | ICD-10-CM | POA: Diagnosis not present

## 2022-11-23 DIAGNOSIS — Z96653 Presence of artificial knee joint, bilateral: Secondary | ICD-10-CM | POA: Diagnosis not present

## 2022-11-23 DIAGNOSIS — I10 Essential (primary) hypertension: Secondary | ICD-10-CM | POA: Diagnosis not present

## 2022-11-23 NOTE — Progress Notes (Signed)
Physical Therapy Treatment Patient Details Name: [name] MRN: [id number] DOB: 1946/11/01 Today's Date: 11/23/2022   History of Present Illness Pt is 76 yo female s/p L anterior THA on 11/21/22.  Pt with hx of R THA 2021, thyroid CA, arthritis, GERD, kidney stones, HTN, and hypothyroidism    PT Comments    Received pt semi-reclined in bed and eager for discharge today. Pt performed bed mobility with supervision using gait belt as leg lifter and all transfers with RW and supervision throughout session. Pt ambulated in/out of bathroom with RW and supervision and able to perform 3/3 toileting tasks without assist. Stood at sink and performed hand hygiene without assist, then ambulated 243f with RW and supervision through hallway. Pt verbalized confidence with stair navigation, and reported having ramp to enter from garage and not needing to go up/down any steps at home or at church. Pt able to recall hip precautions (no abduction) and verbalized confidence with HEP. Pt left in recliner with all needs met and RN updated on pt's current status. Acute PT to cont to follow.     Recommendations for follow up therapy are one component of a multi-disciplinary discharge planning process, led by the attending physician.  Recommendations may be updated based on patient status, additional functional criteria and insurance authorization.  Follow Up Recommendations  Follow physician's recommendations for discharge plan and follow up therapies     Assistance Recommended at Discharge Intermittent Supervision/Assistance  Patient can return home with the following A little help with walking and/or transfers;A little help with bathing/dressing/bathroom;Assistance with cooking/housework;Help with stairs or ramp for entrance   Equipment Recommendations  None recommended by PT    Recommendations for Other Services       Precautions / Restrictions Precautions Precautions: Fall Restrictions Weight  Bearing Restrictions: No Other Position/Activity Restrictions: limit abduction, no abduction therex supine or standing (per RN surgical team)     Mobility  Bed Mobility Overal bed mobility: Needs Assistance Bed Mobility: Supine to Sit, Rolling Rolling: Supervision   Supine to sit: Supervision, HOB elevated     General bed mobility comments: HOB elevated and use of gait belt as leg lifter for LLE Patient Response: Cooperative  Transfers Overall transfer level: Needs assistance Equipment used: Rolling walker (2 wheels) Transfers: Sit to/from Stand, Bed to chair/wheelchair/BSC Sit to Stand: Supervision           General transfer comment: stood from EOB and from toilet with bedside commode over top with RW and supervision    Ambulation/Gait Ambulation/Gait assistance: Supervision Gait Distance (Feet): 200 Feet Assistive device: Rolling walker (2 wheels) Gait Pattern/deviations: Step-to pattern, Decreased stride length, Step-through pattern, Decreased step length - right, Decreased step length - left, Decreased stance time - left, Narrow base of support Gait velocity: decreased Gait velocity interpretation: <1.31 ft/sec, indicative of household ambulator       Stairs         General stair comments: pt politely declined practicing stairs, verbalizing confidence with task. Pt also states her garage has a ramp to enter and she does not have any steps inside her home   Wheelchair Mobility    Modified Rankin (Stroke Patients Only)       Balance Overall balance assessment: Needs assistance Sitting-balance support: No upper extremity supported, Feet supported Sitting balance-Leahy Scale: Good     Standing balance support: Bilateral upper extremity supported, During functional activity, Reliant on assistive device for balance (RW) Standing balance-Leahy Scale: Fair Standing balance comment: able to  maintain static and dynamic standing balance with RW and  supervision                            Cognition Arousal/Alertness: Awake/alert Behavior During Therapy: WFL for tasks assessed/performed Overall Cognitive Status: Within Functional Limits for tasks assessed                                 General Comments: pleasant and cooperative        Exercises      General Comments General comments (skin integrity, edema, etc.): Pt able to recall hip precautions (no abduction) and verbalized confidence with HEP provided yesterday      Pertinent Vitals/Pain Pain Assessment Pain Assessment: 0-10 Pain Score: 5  Pain Location: L hip with weight bearing - denied any pain at rest Pain Descriptors / Indicators: Discomfort, Guarding, Grimacing, Sore Pain Intervention(s): Monitored during session, Repositioned, Limited activity within patient's tolerance    Home Living                          Prior Function            PT Goals (current goals can now be found in the care plan section) Acute Rehab PT Goals Patient Stated Goal: to be able to attend Christmas program at church Sunday PT Goal Formulation: With patient Time For Goal Achievement: 12/05/22 Potential to Achieve Goals: Good Progress towards PT goals: Progressing toward goals    Frequency    7X/week      PT Plan Current plan remains appropriate    Co-evaluation              AM-PAC PT "6 Clicks" Mobility   Outcome Measure  Help needed turning from your back to your side while in a flat bed without using bedrails?: A Little Help needed moving from lying on your back to sitting on the side of a flat bed without using bedrails?: A Little Help needed moving to and from a bed to a chair (including a wheelchair)?: A Little Help needed standing up from a chair using your arms (e.g., wheelchair or bedside chair)?: A Little Help needed to walk in hospital room?: A Little Help needed climbing 3-5 steps with a railing? : A Little 6 Click  Score: 18    End of Session   Activity Tolerance: Patient tolerated treatment well;Patient limited by pain Patient left: in chair;with call bell/phone within reach Nurse Communication: Mobility status PT Visit Diagnosis: Other abnormalities of gait and mobility (R26.89);Muscle weakness (generalized) (M62.81);Pain Pain - Right/Left: Left Pain - part of body: Hip     Time: 0920-0939 PT Time Calculation (min) (ACUTE ONLY): 19 min  Charges:  $Gait Training: 8-22 mins                     Becky Sax PT, DPT  Blenda Nicely 11/23/2022, 10:06 AM

## 2022-11-23 NOTE — Discharge Summary (Signed)
Patient ID: LUNNA VOGELGESANG MRN: [id number] DOB/AGE: [birth year] 76 y.o.  Admit date: 11/21/2022 Discharge date: 11/23/2022  Admission Diagnoses:  Principal Problem:   Primary osteoarthritis of left hip Active Problems:   Status post total replacement of left hip   Discharge Diagnoses:  Same  Past Medical History:  Diagnosis Date   Arthritis    Cancer ([place])    Thyroid cancer   COVID-19 virus infection 12/2020   Family history of adverse reaction to anesthesia    mother "couldn't wake up good" and would throw up   GERD (gastroesophageal reflux disease)    Heart murmur    History of kidney stones    Hypercholesteremia    resolved after gastric bypass   Hypertension    Hypothyroidism     Surgeries: Procedure(s): LEFT TOTAL HIP ARTHROPLASTY ANTERIOR APPROACH on 11/21/2022   Consultants:   Discharged Condition: Improved  Hospital Course: WALAA CAREL is an 76 y.o. female who was admitted 11/21/2022 for operative treatment ofPrimary osteoarthritis of left hip. Patient has severe unremitting pain that affects sleep, daily activities, and work/hobbies. After pre-op clearance the patient was taken to the operating room on 11/21/2022 and underwent  Procedure(s): LEFT TOTAL HIP ARTHROPLASTY ANTERIOR APPROACH.    Patient was given perioperative antibiotics:  Anti-infectives (From admission, onward)    Start     Dose/Rate Route Frequency Ordered Stop   11/21/22 1500  ceFAZolin (ANCEF) IVPB 2g/100 mL premix        2 g 200 mL/hr over 30 Minutes Intravenous Every 6 hours 11/21/22 1252 11/21/22 2129   11/21/22 0715  ceFAZolin (ANCEF) IVPB 2g/100 mL premix        2 g 200 mL/hr over 30 Minutes Intravenous On call to O.R. 11/21/22 7867 11/21/22 6720        Patient was given sequential compression devices, early ambulation, and chemoprophylaxis to prevent DVT.  Patient benefited maximally from hospital stay and there were no complications.    Recent vital signs: Patient  Vitals for the past 24 hrs:  BP Temp Temp src Pulse Resp SpO2  11/22/22 2000 120/71 -- -- 68 16 100 %  11/22/22 1425 113/60 98.5 F (36.9 C) Oral 71 16 97 %     Recent laboratory studies:  Recent Labs    11/22/22 0245  WBC 7.9  HGB 9.4*  HCT 29.0*  PLT 183     Discharge Medications:   Allergies as of 11/23/2022       Reactions   Codeine Nausea And Vomiting   Demerol Nausea And Vomiting   Nsaids Other (See Comments)   Due to gastric bypass   Quinine Other (See Comments)   Unknown reaction type (patient is unable to recall reaction type)        Medication List     STOP taking these medications    acetaminophen 500 MG tablet Commonly known as: TYLENOL       TAKE these medications    amoxicillin 500 MG capsule Commonly known as: AMOXIL Take 2,000 mg by mouth See admin instructions. Take 4 capsules (2000 mg) by mouth 1 hour prior to dental appointments   CALCIUM CITRATE+D3 PO Take 630 mg by mouth 2 (two) times daily.   carboxymethylcellulose 0.5 % Soln Commonly known as: REFRESH PLUS Place 1 drop into both eyes in the morning and at bedtime.   docusate sodium 100 MG capsule Commonly known as: Colace Take 1 capsule (100 mg total) by mouth daily as needed.   enalapril  10 MG tablet Commonly known as: VASOTEC Take 1 tablet (10 mg total) by mouth at bedtime.   famotidine 40 MG tablet Commonly known as: PEPCID TAKE 1 TABLET DAILY (CANCELRANTITIDINE) What changed: See the new instructions.   ferrous sulfate 325 (65 FE) MG tablet Take 325 mg by mouth daily in the afternoon.   levothyroxine 150 MCG tablet Commonly known as: Synthroid Take 1 tablet (150 mcg total) by mouth daily before breakfast.   MAGNESIUM PO Take 1 tablet by mouth daily as needed (leg/feet cramps).   methocarbamol 750 MG tablet Commonly known as: Robaxin-750 Take 1 tablet (750 mg total) by mouth 2 (two) times daily as needed for muscle spasms.   multivitamin with minerals Tabs  tablet Take 1 tablet by mouth in the morning and at bedtime.   ondansetron 4 MG tablet Commonly known as: Zofran Take 1 tablet (4 mg total) by mouth every 8 (eight) hours as needed for nausea or vomiting.   oxyCODONE-acetaminophen 5-325 MG tablet Commonly known as: Percocet Take 1 tablet by mouth every 8 (eight) hours as needed. To be taken after surgery   rivaroxaban 10 MG Tabs tablet Commonly known as: XARELTO Take 1 tablet (10 mg total) by mouth daily. To be taken after surgery to prevent blood clots   valACYclovir 1000 MG tablet Commonly known as: VALTREX TAKE 2 TABLETS AT FIRST SIGN OF FEVER BLISTER, THEN 2 TABLETS 2 HOURSLATER What changed:  how much to take how to take this when to take this reasons to take this additional instructions   vitamin B-12 500 MCG tablet Commonly known as: CYANOCOBALAMIN Take 500 mcg by mouth daily in the afternoon.   VITAMIN D PO Take 2,000 Units by mouth in the morning and at bedtime.   ZINC PO Take 1 tablet by mouth daily as needed (leg/feet cramps).               Durable Medical Equipment  (From admission, onward)           Start     Ordered   11/21/22 1253  DME Walker rolling  Once       Question:  Patient needs a walker to treat with the following condition  Answer:  History of hip replacement   11/21/22 1252   11/21/22 1253  DME 3 n 1  Once        11/21/22 1252   11/21/22 1253  DME Bedside commode  Once       Question:  Patient needs a bedside commode to treat with the following condition  Answer:  History of hip replacement   11/21/22 1252            Diagnostic Studies: DG Pelvis Portable  Result Date: 11/21/2022 CLINICAL DATA:  84166 Hip joint replacement status 06301 EXAM: PORTABLE PELVIS 1-2 VIEWS COMPARISON:  12/22/2021 FINDINGS: Interval left hip arthroplasty, components project in expected location. Stable right hip arthroplasty components. No fracture or dislocation. Bony pelvis intact. Stable right  pelvic phlebolith. IMPRESSION: Interval left hip arthroplasty, without apparent complication. Electronically Signed   By: Lucrezia Europe M.D.   On: 11/21/2022 12:02   DG HIP UNILAT WITH PELVIS 1V LEFT  Result Date: 11/21/2022 CLINICAL DATA:  Left total hip arthroplasty. EXAM: DG HIP (WITH OR WITHOUT PELVIS) 1V*L*; DG C-ARM 1-60 MIN-NO REPORT Radiation exposure index: 3.0986 mGy. COMPARISON:  July 21, 2022. FINDINGS: Eight intraoperative fluoroscopic images were obtained of the left hip. The left acetabular and femoral components are well situated.  IMPRESSION: Fluoroscopic guidance provided during left total hip arthroplasty. Electronically Signed   By: Marijo Conception M.D.   On: 11/21/2022 10:58   DG C-Arm 1-60 Min-No Report  Result Date: 11/21/2022 Fluoroscopy was utilized by the requesting physician.  No radiographic interpretation.   DG C-Arm 1-60 Min-No Report  Result Date: 11/21/2022 Fluoroscopy was utilized by the requesting physician.  No radiographic interpretation.   ECHOCARDIOGRAM COMPLETE  Result Date: 11/02/2022    ECHOCARDIOGRAM REPORT   Patient Name:   OVIDA DELAGARZA Date of Exam: 11/02/2022 Medical Rec #:  [id number]         Height:       67.0 in Accession #:    [id number]        Weight:       172.8 lb Date of Birth:  1946/06/21         BSA:          1.900 m Patient Age:    44 years          BP:           138/74 mmHg Patient Gender: F                 HR:           58 bpm. Exam Location:  [facility] Procedure: 2D Echo, Color Doppler, Cardiac Doppler and Strain Analysis Indications:    R01.1 Murmur  History:        Patient has prior history of Echocardiogram examinations, most                 recent 12/28/2016. Signs/Symptoms:Murmur; Risk                 Factors:Hypertension, Dyslipidemia and Non-Smoker.  Sonographer:    Jeneen Montgomery RDMS, RVT, RDCS Referring Phys: [place]  1. Left ventricular ejection fraction, by estimation, is 60 to 65%. The left ventricle has  normal function. The left ventricle has no regional wall motion abnormalities. Left ventricular diastolic parameters are consistent with Grade II diastolic dysfunction (pseudonormalization). The average left ventricular global longitudinal strain is -29.1 %.  2. Right ventricular systolic function is mildly reduced. The right ventricular size is normal.  3. Left atrial size was severely dilated.  4. Mitral valve leaflets are not adequately visualized. There appears to be a mobile echodensity 0.59 x 0.89 cm attached possibly to the anterior mitral valve leaflet by a stalk and is noted on the atrial side during systole. Differential diagnoses include chordal rupture, flail mitral valve leaflet, tumor, thrombus, vegetation. Clinical correlation is recommended. Recommend TEE to further evaluate the mitral valve pathology. Moderate eccentric anteriorly directed mitral valve regurgitation. No evidence of mitral stenosis.  5. The aortic valve was not well visualized. Aortic valve regurgitation is not visualized. No aortic stenosis is present.  6. The inferior vena cava is normal in size with greater than 50% respiratory variability, suggesting right atrial pressure of 3 mmHg. Comparison(s): No prior Echocardiogram. FINDINGS  Left Ventricle: Left ventricular ejection fraction, by estimation, is 60 to 65%. The left ventricle has normal function. The left ventricle has no regional wall motion abnormalities. The average left ventricular global longitudinal strain is -29.1 %. The left ventricular internal cavity size was normal in size. There is no left ventricular hypertrophy. Left ventricular diastolic parameters are consistent with Grade II diastolic dysfunction (pseudonormalization). Right Ventricle: The right ventricular size is normal. No increase in right ventricular wall thickness. Right ventricular systolic  function is mildly reduced. Left Atrium: Left atrial size was severely dilated. Right Atrium: Right atrial size  was normal in size. Pericardium: There is no evidence of pericardial effusion. Mitral Valve: Mitral valve leaflets are not adequately visualized. There appears to be a mobile echodensity 0.59 x 0.89 cm attached possibly to the anterior mitral valve leaflet by a stalk and is noted on the atrial side during systole. Differential diagnoses include chordal rupture, flail mitral valve leaflet, tumor, thrombus, vegetation. Clinical correlation is recommended. Recommend TEE to further evaluate the mitral valve pathology. Moderate eccentric anteriorly directed mitral valve regurgitation. No evidence of mitral valve stenosis. Tricuspid Valve: The tricuspid valve is not well visualized. Tricuspid valve regurgitation is not demonstrated. No evidence of tricuspid stenosis. Aortic Valve: The aortic valve was not well visualized. Aortic valve regurgitation is not visualized. No aortic stenosis is present. Aortic valve mean gradient measures 5.0 mmHg. Aortic valve peak gradient measures 11.4 mmHg. Aortic valve area, by VTI measures 2.26 cm. Pulmonic Valve: The pulmonic valve was not well visualized. Pulmonic valve regurgitation is not visualized. No evidence of pulmonic stenosis. Aorta: The aortic root is normal in size and structure. Venous: The inferior vena cava is normal in size with greater than 50% respiratory variability, suggesting right atrial pressure of 3 mmHg. IAS/Shunts: No atrial level shunt detected by color flow Doppler.  LEFT VENTRICLE PLAX 2D LVIDd:         5.40 cm      Diastology LVIDs:         3.20 cm      LV e' medial:    7.29 cm/s LV PW:         0.80 cm      LV E/e' medial:  18.0 LV IVS:        0.80 cm      LV e' lateral:   9.14 cm/s LVOT diam:     1.90 cm      LV E/e' lateral: 14.3 LV SV:         77 LV SV Index:   40           2D Longitudinal Strain LVOT Area:     2.84 cm     2D Strain GLS (A2C):   -21.4 %                             2D Strain GLS (A3C):   -34.5 %                             2D Strain GLS  (A4C):   -31.4 % LV Volumes (MOD)            2D Strain GLS Avg:     -29.1 % LV vol d, MOD A2C: 104.0 ml LV vol d, MOD A4C: 149.0 ml LV vol s, MOD A2C: 39.6 ml LV vol s, MOD A4C: 54.9 ml LV SV MOD A2C:     64.4 ml LV SV MOD A4C:     149.0 ml LV SV MOD BP:      80.5 ml RIGHT VENTRICLE RV S prime:     11.70 cm/s TAPSE (M-mode): 2.1 cm LEFT ATRIUM           Index        RIGHT ATRIUM           Index LA diam:  4.60 cm 2.42 cm/m   RA Area:     15.80 cm LA Vol (A4C): 96.9 ml 50.99 ml/m  RA Volume:   37.70 ml  19.84 ml/m  AORTIC VALVE AV Area (Vmax):    2.52 cm AV Area (Vmean):   2.73 cm AV Area (VTI):     2.26 cm AV Vmax:           168.50 cm/s AV Vmean:          106.000 cm/s AV VTI:            0.339 m AV Peak Grad:      11.4 mmHg AV Mean Grad:      5.0 mmHg LVOT Vmax:         150.00 cm/s LVOT Vmean:        102.000 cm/s LVOT VTI:          0.270 m LVOT/AV VTI ratio: 0.80  AORTA Ao Root diam: 3.30 cm MITRAL VALVE                TRICUSPID VALVE MV Area (PHT): 3.34 cm     TR Peak grad:   33.6 mmHg MV Decel Time: 227 msec     TR Vmax:        290.00 cm/s MR Peak grad: 97.9 mmHg MR Mean grad: 113.5 mmHg    SHUNTS MR Vmax:      494.67 cm/s   Systemic VTI:  0.27 m MR Vmean:     508.5 cm/s    Systemic Diam: 1.90 cm MV E velocity: 131.00 cm/s MV A velocity: 62.40 cm/s MV E/A ratio:  2.10 Vishnu Priya Mallipeddi Electronically signed by Lorelee Cover Mallipeddi Signature Date/Time: 11/02/2022/3:46:56 PM    Final     Disposition: Discharge disposition: 01-Home or Self Care          Follow-up Information     Leandrew Koyanagi, MD. Schedule an appointment as soon as possible for a visit in 2 week(s).   Specialty: Orthopedic Surgery Contact information: [place] [id number] [phone]         [facility], [place] Follow up.   Specialty: Home Health Services Why: Home health PT services will be provided by [facility], start of care within 48 hours post discharge Contact  information: [address] [place] [address] [phone]                  Signed: Aundra Dubin 11/23/2022, 8:01 AM

## 2022-11-23 NOTE — Progress Notes (Signed)
Mobility Specialist Progress Note   11/23/22 1017  Mobility  Activity Ambulated with assistance in hallway  Level of Assistance Standby assist, set-up cues, supervision of patient - no hands on  Assistive Device Front wheel walker  Distance Ambulated (ft) 100 ft (x2)  LLE Weight Bearing WBAT  Activity Response Tolerated well  $Mobility charge 1 Mobility   Received pt in chair having no complaints and agreeable. Min cues needed for posture but otherwise no complaints or faults during ambulation. Returned back to chair w/ call bell in reach and RN present in room.   Holland Falling Mobility Specialist Please contact via SecureChat or  Rehab office at [phone]

## 2022-11-23 NOTE — Progress Notes (Addendum)
Subjective: 2 Days Post-Op Procedure(s) (LRB): LEFT TOTAL HIP ARTHROPLASTY ANTERIOR APPROACH (Left) Patient reports pain as mild.    Objective: Vital signs in last 24 hours: Temp:  [98.5 F (36.9 C)] 98.5 F (36.9 C) (12/12 1425) Pulse Rate:  [68-71] 68 (12/12 2000) Resp:  [16] 16 (12/12 2000) BP: (113-120)/(60-71) 120/71 (12/12 2000) SpO2:  [97 %-100 %] 100 % (12/12 2000)  Intake/Output from previous day: 12/12 0701 - 12/13 0700 In: 600 [P.O.:600] Out: -  Intake/Output this shift: No intake/output data recorded.  Recent Labs    11/22/22 0245  HGB 9.4*   Recent Labs    11/22/22 0245  WBC 7.9  RBC 3.10*  HCT 29.0*  PLT 183   No results for input(s): "NA", "K", "CL", "CO2", "BUN", "CREATININE", "GLUCOSE", "CALCIUM" in the last 72 hours. No results for input(s): "LABPT", "INR" in the last 72 hours.  Neurologically intact Neurovascular intact Sensation intact distally Intact pulses distally Dorsiflexion/Plantar flexion intact Incision: dressing C/D/I No cellulitis present Compartment soft   Assessment/Plan: 2 Days Post-Op Procedure(s) (LRB): LEFT TOTAL HIP ARTHROPLASTY ANTERIOR APPROACH (Left) Advance diet Up with therapy D/C IV fluids WBAT LLE F/u with Erlinda Hong 2 weeks po D/c  home once cleared by PT (likely after first PT session)      Aundra Dubin 11/23/2022, 7:59 AM

## 2022-11-24 ENCOUNTER — Encounter (HOSPITAL_COMMUNITY): Payer: Self-pay | Admitting: Orthopaedic Surgery

## 2022-11-24 DIAGNOSIS — Z8616 Personal history of COVID-19: Secondary | ICD-10-CM | POA: Diagnosis not present

## 2022-11-24 DIAGNOSIS — M1990 Unspecified osteoarthritis, unspecified site: Secondary | ICD-10-CM | POA: Diagnosis not present

## 2022-11-24 DIAGNOSIS — E538 Deficiency of other specified B group vitamins: Secondary | ICD-10-CM | POA: Diagnosis not present

## 2022-11-24 DIAGNOSIS — Z85850 Personal history of malignant neoplasm of thyroid: Secondary | ICD-10-CM | POA: Diagnosis not present

## 2022-11-24 DIAGNOSIS — I10 Essential (primary) hypertension: Secondary | ICD-10-CM | POA: Diagnosis not present

## 2022-11-24 DIAGNOSIS — Z7901 Long term (current) use of anticoagulants: Secondary | ICD-10-CM | POA: Diagnosis not present

## 2022-11-24 DIAGNOSIS — Z96641 Presence of right artificial hip joint: Secondary | ICD-10-CM | POA: Diagnosis not present

## 2022-11-24 DIAGNOSIS — E039 Hypothyroidism, unspecified: Secondary | ICD-10-CM | POA: Diagnosis not present

## 2022-11-24 DIAGNOSIS — Z9884 Bariatric surgery status: Secondary | ICD-10-CM | POA: Diagnosis not present

## 2022-11-24 DIAGNOSIS — N3941 Urge incontinence: Secondary | ICD-10-CM | POA: Diagnosis not present

## 2022-11-24 DIAGNOSIS — Z9181 History of falling: Secondary | ICD-10-CM | POA: Diagnosis not present

## 2022-11-24 DIAGNOSIS — Z96642 Presence of left artificial hip joint: Secondary | ICD-10-CM | POA: Diagnosis not present

## 2022-11-24 DIAGNOSIS — I051 Rheumatic mitral insufficiency: Secondary | ICD-10-CM | POA: Diagnosis not present

## 2022-11-24 DIAGNOSIS — E559 Vitamin D deficiency, unspecified: Secondary | ICD-10-CM | POA: Diagnosis not present

## 2022-11-24 DIAGNOSIS — K219 Gastro-esophageal reflux disease without esophagitis: Secondary | ICD-10-CM | POA: Diagnosis not present

## 2022-11-24 DIAGNOSIS — Z87442 Personal history of urinary calculi: Secondary | ICD-10-CM | POA: Diagnosis not present

## 2022-11-24 DIAGNOSIS — E785 Hyperlipidemia, unspecified: Secondary | ICD-10-CM | POA: Diagnosis not present

## 2022-11-24 DIAGNOSIS — Z96653 Presence of artificial knee joint, bilateral: Secondary | ICD-10-CM | POA: Diagnosis not present

## 2022-11-24 DIAGNOSIS — Z471 Aftercare following joint replacement surgery: Secondary | ICD-10-CM | POA: Diagnosis not present

## 2022-11-25 ENCOUNTER — Telehealth: Payer: Self-pay | Admitting: *Deleted

## 2022-11-25 NOTE — Telephone Encounter (Signed)
Ortho bundle D/C call completed. 

## 2022-11-28 ENCOUNTER — Encounter (HOSPITAL_COMMUNITY): Payer: Self-pay | Admitting: Internal Medicine

## 2022-11-28 DIAGNOSIS — Z96642 Presence of left artificial hip joint: Secondary | ICD-10-CM | POA: Diagnosis not present

## 2022-11-28 DIAGNOSIS — E039 Hypothyroidism, unspecified: Secondary | ICD-10-CM | POA: Diagnosis not present

## 2022-11-28 DIAGNOSIS — Z87442 Personal history of urinary calculi: Secondary | ICD-10-CM | POA: Diagnosis not present

## 2022-11-28 DIAGNOSIS — Z8616 Personal history of COVID-19: Secondary | ICD-10-CM | POA: Diagnosis not present

## 2022-11-28 DIAGNOSIS — Z96641 Presence of right artificial hip joint: Secondary | ICD-10-CM | POA: Diagnosis not present

## 2022-11-28 DIAGNOSIS — Z9181 History of falling: Secondary | ICD-10-CM | POA: Diagnosis not present

## 2022-11-28 DIAGNOSIS — E559 Vitamin D deficiency, unspecified: Secondary | ICD-10-CM | POA: Diagnosis not present

## 2022-11-28 DIAGNOSIS — Z9884 Bariatric surgery status: Secondary | ICD-10-CM | POA: Diagnosis not present

## 2022-11-28 DIAGNOSIS — E538 Deficiency of other specified B group vitamins: Secondary | ICD-10-CM | POA: Diagnosis not present

## 2022-11-28 DIAGNOSIS — Z96653 Presence of artificial knee joint, bilateral: Secondary | ICD-10-CM | POA: Diagnosis not present

## 2022-11-28 DIAGNOSIS — K219 Gastro-esophageal reflux disease without esophagitis: Secondary | ICD-10-CM | POA: Diagnosis not present

## 2022-11-28 DIAGNOSIS — Z7901 Long term (current) use of anticoagulants: Secondary | ICD-10-CM | POA: Diagnosis not present

## 2022-11-28 DIAGNOSIS — Z85850 Personal history of malignant neoplasm of thyroid: Secondary | ICD-10-CM | POA: Diagnosis not present

## 2022-11-28 DIAGNOSIS — Z471 Aftercare following joint replacement surgery: Secondary | ICD-10-CM | POA: Diagnosis not present

## 2022-11-28 DIAGNOSIS — M1990 Unspecified osteoarthritis, unspecified site: Secondary | ICD-10-CM | POA: Diagnosis not present

## 2022-11-28 DIAGNOSIS — N3941 Urge incontinence: Secondary | ICD-10-CM | POA: Diagnosis not present

## 2022-11-28 DIAGNOSIS — E785 Hyperlipidemia, unspecified: Secondary | ICD-10-CM | POA: Diagnosis not present

## 2022-11-28 DIAGNOSIS — I051 Rheumatic mitral insufficiency: Secondary | ICD-10-CM | POA: Diagnosis not present

## 2022-11-28 DIAGNOSIS — I10 Essential (primary) hypertension: Secondary | ICD-10-CM | POA: Diagnosis not present

## 2022-11-29 ENCOUNTER — Other Ambulatory Visit (HOSPITAL_COMMUNITY): Payer: Self-pay | Admitting: Emergency Medicine

## 2022-11-30 DIAGNOSIS — Z96642 Presence of left artificial hip joint: Secondary | ICD-10-CM | POA: Diagnosis not present

## 2022-11-30 DIAGNOSIS — E039 Hypothyroidism, unspecified: Secondary | ICD-10-CM | POA: Diagnosis not present

## 2022-11-30 DIAGNOSIS — Z85850 Personal history of malignant neoplasm of thyroid: Secondary | ICD-10-CM | POA: Diagnosis not present

## 2022-11-30 DIAGNOSIS — Z96653 Presence of artificial knee joint, bilateral: Secondary | ICD-10-CM | POA: Diagnosis not present

## 2022-11-30 DIAGNOSIS — I10 Essential (primary) hypertension: Secondary | ICD-10-CM | POA: Diagnosis not present

## 2022-11-30 DIAGNOSIS — Z96641 Presence of right artificial hip joint: Secondary | ICD-10-CM | POA: Diagnosis not present

## 2022-11-30 DIAGNOSIS — Z9181 History of falling: Secondary | ICD-10-CM | POA: Diagnosis not present

## 2022-11-30 DIAGNOSIS — Z87442 Personal history of urinary calculi: Secondary | ICD-10-CM | POA: Diagnosis not present

## 2022-11-30 DIAGNOSIS — I051 Rheumatic mitral insufficiency: Secondary | ICD-10-CM | POA: Diagnosis not present

## 2022-11-30 DIAGNOSIS — Z7901 Long term (current) use of anticoagulants: Secondary | ICD-10-CM | POA: Diagnosis not present

## 2022-11-30 DIAGNOSIS — E538 Deficiency of other specified B group vitamins: Secondary | ICD-10-CM | POA: Diagnosis not present

## 2022-11-30 DIAGNOSIS — E559 Vitamin D deficiency, unspecified: Secondary | ICD-10-CM | POA: Diagnosis not present

## 2022-11-30 DIAGNOSIS — Z471 Aftercare following joint replacement surgery: Secondary | ICD-10-CM | POA: Diagnosis not present

## 2022-11-30 DIAGNOSIS — K219 Gastro-esophageal reflux disease without esophagitis: Secondary | ICD-10-CM | POA: Diagnosis not present

## 2022-11-30 DIAGNOSIS — Z8616 Personal history of COVID-19: Secondary | ICD-10-CM | POA: Diagnosis not present

## 2022-11-30 DIAGNOSIS — Z9884 Bariatric surgery status: Secondary | ICD-10-CM | POA: Diagnosis not present

## 2022-11-30 DIAGNOSIS — E785 Hyperlipidemia, unspecified: Secondary | ICD-10-CM | POA: Diagnosis not present

## 2022-11-30 DIAGNOSIS — N3941 Urge incontinence: Secondary | ICD-10-CM | POA: Diagnosis not present

## 2022-11-30 DIAGNOSIS — M1990 Unspecified osteoarthritis, unspecified site: Secondary | ICD-10-CM | POA: Diagnosis not present

## 2022-12-01 ENCOUNTER — Other Ambulatory Visit: Payer: Self-pay | Admitting: Physician Assistant

## 2022-12-01 ENCOUNTER — Telehealth: Payer: Self-pay | Admitting: *Deleted

## 2022-12-01 MED ORDER — TRAMADOL HCL 50 MG PO TABS: 50.0000 mg | ORAL_TABLET | Freq: Three times a day (TID) | ORAL | 2 refills | Status: DC | PRN

## 2022-12-01 NOTE — Telephone Encounter (Signed)
That sounds like a good idea to me!  I have sent in the tramadol

## 2022-12-01 NOTE — Telephone Encounter (Signed)
Spoke with patient today, who is doing pretty well, but having more pain with this hip than her last one. She has been hesitant to take her Percocet for fear of side effects. Muscle relaxer works well. We discussed something like Tramadol for breakthrough pain with Tylenol since she can't take Nsaids. Would that be recommended?

## 2022-12-02 DIAGNOSIS — Z471 Aftercare following joint replacement surgery: Secondary | ICD-10-CM | POA: Diagnosis not present

## 2022-12-02 DIAGNOSIS — Z96642 Presence of left artificial hip joint: Secondary | ICD-10-CM | POA: Diagnosis not present

## 2022-12-02 DIAGNOSIS — Z96653 Presence of artificial knee joint, bilateral: Secondary | ICD-10-CM | POA: Diagnosis not present

## 2022-12-02 DIAGNOSIS — E039 Hypothyroidism, unspecified: Secondary | ICD-10-CM | POA: Diagnosis not present

## 2022-12-02 DIAGNOSIS — K219 Gastro-esophageal reflux disease without esophagitis: Secondary | ICD-10-CM | POA: Diagnosis not present

## 2022-12-02 DIAGNOSIS — E785 Hyperlipidemia, unspecified: Secondary | ICD-10-CM | POA: Diagnosis not present

## 2022-12-02 DIAGNOSIS — E538 Deficiency of other specified B group vitamins: Secondary | ICD-10-CM | POA: Diagnosis not present

## 2022-12-02 DIAGNOSIS — I051 Rheumatic mitral insufficiency: Secondary | ICD-10-CM | POA: Diagnosis not present

## 2022-12-02 DIAGNOSIS — Z87442 Personal history of urinary calculi: Secondary | ICD-10-CM | POA: Diagnosis not present

## 2022-12-02 DIAGNOSIS — Z7901 Long term (current) use of anticoagulants: Secondary | ICD-10-CM | POA: Diagnosis not present

## 2022-12-02 DIAGNOSIS — Z96641 Presence of right artificial hip joint: Secondary | ICD-10-CM | POA: Diagnosis not present

## 2022-12-02 DIAGNOSIS — Z9181 History of falling: Secondary | ICD-10-CM | POA: Diagnosis not present

## 2022-12-02 DIAGNOSIS — Z85850 Personal history of malignant neoplasm of thyroid: Secondary | ICD-10-CM | POA: Diagnosis not present

## 2022-12-02 DIAGNOSIS — E559 Vitamin D deficiency, unspecified: Secondary | ICD-10-CM | POA: Diagnosis not present

## 2022-12-02 DIAGNOSIS — N3941 Urge incontinence: Secondary | ICD-10-CM | POA: Diagnosis not present

## 2022-12-02 DIAGNOSIS — I10 Essential (primary) hypertension: Secondary | ICD-10-CM | POA: Diagnosis not present

## 2022-12-02 DIAGNOSIS — Z8616 Personal history of COVID-19: Secondary | ICD-10-CM | POA: Diagnosis not present

## 2022-12-02 DIAGNOSIS — M1990 Unspecified osteoarthritis, unspecified site: Secondary | ICD-10-CM | POA: Diagnosis not present

## 2022-12-02 DIAGNOSIS — Z9884 Bariatric surgery status: Secondary | ICD-10-CM | POA: Diagnosis not present

## 2022-12-06 DIAGNOSIS — K219 Gastro-esophageal reflux disease without esophagitis: Secondary | ICD-10-CM | POA: Diagnosis not present

## 2022-12-06 DIAGNOSIS — Z96642 Presence of left artificial hip joint: Secondary | ICD-10-CM | POA: Diagnosis not present

## 2022-12-06 DIAGNOSIS — Z8616 Personal history of COVID-19: Secondary | ICD-10-CM | POA: Diagnosis not present

## 2022-12-06 DIAGNOSIS — Z9884 Bariatric surgery status: Secondary | ICD-10-CM | POA: Diagnosis not present

## 2022-12-06 DIAGNOSIS — E559 Vitamin D deficiency, unspecified: Secondary | ICD-10-CM | POA: Diagnosis not present

## 2022-12-06 DIAGNOSIS — I10 Essential (primary) hypertension: Secondary | ICD-10-CM | POA: Diagnosis not present

## 2022-12-06 DIAGNOSIS — E785 Hyperlipidemia, unspecified: Secondary | ICD-10-CM | POA: Diagnosis not present

## 2022-12-06 DIAGNOSIS — Z471 Aftercare following joint replacement surgery: Secondary | ICD-10-CM | POA: Diagnosis not present

## 2022-12-06 DIAGNOSIS — Z96641 Presence of right artificial hip joint: Secondary | ICD-10-CM | POA: Diagnosis not present

## 2022-12-06 DIAGNOSIS — N3941 Urge incontinence: Secondary | ICD-10-CM | POA: Diagnosis not present

## 2022-12-06 DIAGNOSIS — E039 Hypothyroidism, unspecified: Secondary | ICD-10-CM | POA: Diagnosis not present

## 2022-12-06 DIAGNOSIS — Z7901 Long term (current) use of anticoagulants: Secondary | ICD-10-CM | POA: Diagnosis not present

## 2022-12-06 DIAGNOSIS — M1990 Unspecified osteoarthritis, unspecified site: Secondary | ICD-10-CM | POA: Diagnosis not present

## 2022-12-06 DIAGNOSIS — Z87442 Personal history of urinary calculi: Secondary | ICD-10-CM | POA: Diagnosis not present

## 2022-12-06 DIAGNOSIS — Z96653 Presence of artificial knee joint, bilateral: Secondary | ICD-10-CM | POA: Diagnosis not present

## 2022-12-06 DIAGNOSIS — I051 Rheumatic mitral insufficiency: Secondary | ICD-10-CM | POA: Diagnosis not present

## 2022-12-06 DIAGNOSIS — E538 Deficiency of other specified B group vitamins: Secondary | ICD-10-CM | POA: Diagnosis not present

## 2022-12-06 DIAGNOSIS — Z85850 Personal history of malignant neoplasm of thyroid: Secondary | ICD-10-CM | POA: Diagnosis not present

## 2022-12-06 DIAGNOSIS — Z9181 History of falling: Secondary | ICD-10-CM | POA: Diagnosis not present

## 2022-12-07 ENCOUNTER — Ambulatory Visit (INDEPENDENT_AMBULATORY_CARE_PROVIDER_SITE_OTHER): Payer: Medicare HMO | Admitting: Orthopaedic Surgery

## 2022-12-07 ENCOUNTER — Telehealth: Payer: Self-pay | Admitting: *Deleted

## 2022-12-07 DIAGNOSIS — Z96642 Presence of left artificial hip joint: Secondary | ICD-10-CM

## 2022-12-07 MED ORDER — TRAMADOL HCL 50 MG PO TABS: 50.0000 mg | ORAL_TABLET | Freq: Every day | ORAL | 0 refills | Status: DC | PRN

## 2022-12-07 NOTE — Progress Notes (Signed)
Post-Op Visit Note   Patient: [name]           Date of Birth: [birth year]           MRN: [id number] Visit Date: 12/07/2022 PCP: Coral Spikes, DO   Assessment & Plan:  Chief Complaint:  Chief Complaint  Patient presents with   Left Hip - Routine Post Op    L THA (surgery date 11-21-22)   Visit Diagnoses:  1. Status post total replacement of left hip     Plan: [name] is 2 weeks status post left total hip replacement.  She is doing well overall.  Her home health PT ends tomorrow.  Examination of the left hip shows healed surgical incision without any evidence of infection.  She has good painless range of motion of the hip.  Leg lengths are equal.  [name] is recovering well from her recent surgery.  Instructions reviewed.  Recheck in 4 weeks with standing [facility] pelvis x-rays.  Follow-Up Instructions: No follow-ups on file.   Orders:  No orders of the defined types were placed in this encounter.  No orders of the defined types were placed in this encounter.   Imaging: No results found.  PMFS History: Patient Active Problem List   Diagnosis Date Noted   Status post total replacement of left hip 11/21/2022   MR (mitral regurgitation) 11/08/2022   Murmur, cardiac 11/01/2022   GERD (gastroesophageal reflux disease) 05/11/2022   Urge incontinence 02/21/2022   B12 deficiency 11/10/2021   Vitamin D deficiency 11/10/2021   Primary osteoarthritis of left hip 12/22/2020   Status post total hip replacement, right 12/22/2020   History of gastric bypass 11/02/2020   History of thyroid cancer 10/22/2020   Hypothyroidism 05/22/2016   Essential hypertension, benign 09/09/2013   Hyperlipidemia 09/09/2013   Past Medical History:  Diagnosis Date   Arthritis    Cancer ([place])    Thyroid cancer   COVID-19 virus infection 12/2020   Family history of adverse reaction to anesthesia    mother "couldn't wake up good" and would throw up   GERD (gastroesophageal reflux  disease)    Heart murmur    History of kidney stones    Hypercholesteremia    resolved after gastric bypass   Hypertension    Hypothyroidism     Family History  Problem Relation Age of Onset   Breast cancer Mother    Pneumonia Mother 20   CAD Father 54   Breast cancer Sister    CAD Sister 44   Other Maternal Grandmother 8   CAD Brother 49   Other Brother 88       drug overdose   CAD Other 32   Colon cancer Neg Hx     Past Surgical History:  Procedure Laterality Date   ABDOMINAL HYSTERECTOMY     BILATERAL SALPINGOOPHORECTOMY     BREAST EXCISIONAL BIOPSY Bilateral 1995   benign   CHOLECYSTECTOMY     COLONOSCOPY  11/14/2011   Procedure: COLONOSCOPY;  Surgeon: Daneil Dolin, MD;  [facility];  Service: Endoscopy;  Laterality: N/A;   REPLACEMENT TOTAL KNEE BILATERAL     ROUX-EN-Y PROCEDURE  2007   TEE WITHOUT CARDIOVERSION N/A 11/11/2022   Procedure: TRANSESOPHAGEAL ECHOCARDIOGRAM (TEE);  Surgeon: Chalmers Guest, MD;  Location: [facility];  Service: Cardiovascular;  Laterality: N/A;   TONSILLECTOMY     TOTAL HIP ARTHROPLASTY Right 12/23/2019   Procedure: RIGHT TOTAL HIP ARTHROPLASTY ANTERIOR APPROACH;  Surgeon: Erlinda Hong,  Marylynn Pearson, MD;  Location: [facility];  Service: Orthopedics;  Laterality: Right;   TOTAL HIP ARTHROPLASTY Left 11/21/2022   Procedure: LEFT TOTAL HIP ARTHROPLASTY ANTERIOR APPROACH;  Surgeon: Leandrew Koyanagi, MD;  Location: [facility];  Service: Orthopedics;  Laterality: Left;  3-C   TOTAL THYROIDECTOMY  12/2010   TUBAL LIGATION     Social History   Occupational History   Occupation: Retired Therapist, art  Tobacco Use   Smoking status: Never   Smokeless tobacco: Never  Vaping Use   Vaping Use: Never used  Substance and Sexual Activity   Alcohol use: No   Drug use: No   Sexual activity: Not on file

## 2022-12-07 NOTE — Telephone Encounter (Signed)
Ortho bundle 14 day in office meeting completed. °

## 2022-12-08 DIAGNOSIS — E559 Vitamin D deficiency, unspecified: Secondary | ICD-10-CM | POA: Diagnosis not present

## 2022-12-08 DIAGNOSIS — Z85850 Personal history of malignant neoplasm of thyroid: Secondary | ICD-10-CM | POA: Diagnosis not present

## 2022-12-08 DIAGNOSIS — I051 Rheumatic mitral insufficiency: Secondary | ICD-10-CM | POA: Diagnosis not present

## 2022-12-08 DIAGNOSIS — Z7901 Long term (current) use of anticoagulants: Secondary | ICD-10-CM | POA: Diagnosis not present

## 2022-12-08 DIAGNOSIS — M1990 Unspecified osteoarthritis, unspecified site: Secondary | ICD-10-CM | POA: Diagnosis not present

## 2022-12-08 DIAGNOSIS — E039 Hypothyroidism, unspecified: Secondary | ICD-10-CM | POA: Diagnosis not present

## 2022-12-08 DIAGNOSIS — Z471 Aftercare following joint replacement surgery: Secondary | ICD-10-CM | POA: Diagnosis not present

## 2022-12-08 DIAGNOSIS — Z96653 Presence of artificial knee joint, bilateral: Secondary | ICD-10-CM | POA: Diagnosis not present

## 2022-12-08 DIAGNOSIS — Z9181 History of falling: Secondary | ICD-10-CM | POA: Diagnosis not present

## 2022-12-08 DIAGNOSIS — Z96642 Presence of left artificial hip joint: Secondary | ICD-10-CM | POA: Diagnosis not present

## 2022-12-08 DIAGNOSIS — I10 Essential (primary) hypertension: Secondary | ICD-10-CM | POA: Diagnosis not present

## 2022-12-08 DIAGNOSIS — N3941 Urge incontinence: Secondary | ICD-10-CM | POA: Diagnosis not present

## 2022-12-08 DIAGNOSIS — Z96641 Presence of right artificial hip joint: Secondary | ICD-10-CM | POA: Diagnosis not present

## 2022-12-08 DIAGNOSIS — E785 Hyperlipidemia, unspecified: Secondary | ICD-10-CM | POA: Diagnosis not present

## 2022-12-08 DIAGNOSIS — Z87442 Personal history of urinary calculi: Secondary | ICD-10-CM | POA: Diagnosis not present

## 2022-12-08 DIAGNOSIS — Z8616 Personal history of COVID-19: Secondary | ICD-10-CM | POA: Diagnosis not present

## 2022-12-08 DIAGNOSIS — Z9884 Bariatric surgery status: Secondary | ICD-10-CM | POA: Diagnosis not present

## 2022-12-08 DIAGNOSIS — K219 Gastro-esophageal reflux disease without esophagitis: Secondary | ICD-10-CM | POA: Diagnosis not present

## 2022-12-08 DIAGNOSIS — E538 Deficiency of other specified B group vitamins: Secondary | ICD-10-CM | POA: Diagnosis not present

## 2022-12-16 DIAGNOSIS — Z9884 Bariatric surgery status: Secondary | ICD-10-CM | POA: Diagnosis not present

## 2022-12-16 DIAGNOSIS — Z923 Personal history of irradiation: Secondary | ICD-10-CM | POA: Diagnosis not present

## 2022-12-16 DIAGNOSIS — Z79890 Hormone replacement therapy (postmenopausal): Secondary | ICD-10-CM | POA: Diagnosis not present

## 2022-12-16 DIAGNOSIS — E890 Postprocedural hypothyroidism: Secondary | ICD-10-CM | POA: Diagnosis not present

## 2022-12-16 DIAGNOSIS — Z85850 Personal history of malignant neoplasm of thyroid: Secondary | ICD-10-CM | POA: Diagnosis not present

## 2022-12-19 DIAGNOSIS — H2512 Age-related nuclear cataract, left eye: Secondary | ICD-10-CM | POA: Diagnosis not present

## 2022-12-19 DIAGNOSIS — H18523 Epithelial (juvenile) corneal dystrophy, bilateral: Secondary | ICD-10-CM | POA: Diagnosis not present

## 2022-12-19 DIAGNOSIS — H2511 Age-related nuclear cataract, right eye: Secondary | ICD-10-CM | POA: Diagnosis not present

## 2022-12-22 ENCOUNTER — Ambulatory Visit: Payer: Medicare HMO | Admitting: Orthopaedic Surgery

## 2022-12-26 DIAGNOSIS — H18523 Epithelial (juvenile) corneal dystrophy, bilateral: Secondary | ICD-10-CM | POA: Diagnosis not present

## 2023-01-02 ENCOUNTER — Other Ambulatory Visit: Payer: Self-pay | Admitting: Family Medicine

## 2023-01-04 ENCOUNTER — Telehealth: Payer: Self-pay | Admitting: *Deleted

## 2023-01-04 ENCOUNTER — Ambulatory Visit (INDEPENDENT_AMBULATORY_CARE_PROVIDER_SITE_OTHER): Payer: Medicare HMO | Admitting: Physician Assistant

## 2023-01-04 ENCOUNTER — Ambulatory Visit (INDEPENDENT_AMBULATORY_CARE_PROVIDER_SITE_OTHER): Payer: Medicare HMO

## 2023-01-04 ENCOUNTER — Encounter: Payer: Self-pay | Admitting: Orthopaedic Surgery

## 2023-01-04 DIAGNOSIS — Z96642 Presence of left artificial hip joint: Secondary | ICD-10-CM

## 2023-01-04 NOTE — Telephone Encounter (Signed)
Ortho bundle meeting completed today in office.

## 2023-01-04 NOTE — Progress Notes (Signed)
Post-Op Visit Note   Patient: [name]           Date of Birth: [birth year]           MRN: [id number] Visit Date: 01/04/2023 PCP: Coral Spikes, DO   Assessment & Plan:  Chief Complaint:  Chief Complaint  Patient presents with   Left Hip - Follow-up    Left total hip arthroplasty 11/21/2022   Visit Diagnoses:  1. Status post total replacement of left hip     Plan: Patient is a pleasant 77 year old female who comes in today 6-week status post left total hip replacement 11/21/2022.  She has been doing well.  She has finished her Xarelto.  She continues to work on a home exercise program.  Left hip exam reveals painless hip flexion and logroll.  She is neurovascular intact distally.  At this point, she will continue working on her exercises.  Dental prophylaxis reinforced.  Follow-up in 6 weeks for recheck.  Call with concerns or questions.  Follow-Up Instructions: Return in about 6 weeks (around 02/15/2023).   Orders:  Orders Placed This Encounter  Procedures   XR Pelvis 1-2 Views   No orders of the defined types were placed in this encounter.   Imaging: No results found.  PMFS History: Patient Active Problem List   Diagnosis Date Noted   Status post total replacement of left hip 11/21/2022   MR (mitral regurgitation) 11/08/2022   Murmur, cardiac 11/01/2022   GERD (gastroesophageal reflux disease) 05/11/2022   Urge incontinence 02/21/2022   B12 deficiency 11/10/2021   Vitamin D deficiency 11/10/2021   Primary osteoarthritis of left hip 12/22/2020   Status post total hip replacement, right 12/22/2020   History of gastric bypass 11/02/2020   History of thyroid cancer 10/22/2020   Hypothyroidism 05/22/2016   Essential hypertension, benign 09/09/2013   Hyperlipidemia 09/09/2013   Past Medical History:  Diagnosis Date   Arthritis    Cancer ([place])    Thyroid cancer   COVID-19 virus infection 12/2020   Family history of adverse reaction to anesthesia     mother "couldn't wake up good" and would throw up   GERD (gastroesophageal reflux disease)    Heart murmur    History of kidney stones    Hypercholesteremia    resolved after gastric bypass   Hypertension    Hypothyroidism     Family History  Problem Relation Age of Onset   Breast cancer Mother    Pneumonia Mother 42   CAD Father 56   Breast cancer Sister    CAD Sister 80   Other Maternal Grandmother 53   CAD Brother 70   Other Brother 6       drug overdose   CAD Other 32   Colon cancer Neg Hx     Past Surgical History:  Procedure Laterality Date   ABDOMINAL HYSTERECTOMY     BILATERAL SALPINGOOPHORECTOMY     BREAST EXCISIONAL BIOPSY Bilateral 1995   benign   CHOLECYSTECTOMY     COLONOSCOPY  11/14/2011   Procedure: COLONOSCOPY;  Surgeon: Daneil Dolin, MD;  [facility];  Service: Endoscopy;  Laterality: N/A;   REPLACEMENT TOTAL KNEE BILATERAL     ROUX-EN-Y PROCEDURE  2007   TEE WITHOUT CARDIOVERSION N/A 11/11/2022   Procedure: TRANSESOPHAGEAL ECHOCARDIOGRAM (TEE);  Surgeon: Chalmers Guest, MD;  Location: [facility];  Service: Cardiovascular;  Laterality: N/A;   TONSILLECTOMY     TOTAL HIP ARTHROPLASTY Right 12/23/2019  Procedure: RIGHT TOTAL HIP ARTHROPLASTY ANTERIOR APPROACH;  Surgeon: Leandrew Koyanagi, MD;  Location: [facility];  Service: Orthopedics;  Laterality: Right;   TOTAL HIP ARTHROPLASTY Left 11/21/2022   Procedure: LEFT TOTAL HIP ARTHROPLASTY ANTERIOR APPROACH;  Surgeon: Leandrew Koyanagi, MD;  Location: [facility];  Service: Orthopedics;  Laterality: Left;  3-C   TOTAL THYROIDECTOMY  12/2010   TUBAL LIGATION     Social History   Occupational History   Occupation: Retired Therapist, art  Tobacco Use   Smoking status: Never   Smokeless tobacco: Never  Vaping Use   Vaping Use: Never used  Substance and Sexual Activity   Alcohol use: No   Drug use: No   Sexual activity: Not on file

## 2023-02-07 ENCOUNTER — Ambulatory Visit (INDEPENDENT_AMBULATORY_CARE_PROVIDER_SITE_OTHER): Payer: Medicare HMO | Admitting: Family Medicine

## 2023-02-07 DIAGNOSIS — N3941 Urge incontinence: Secondary | ICD-10-CM

## 2023-02-07 DIAGNOSIS — E538 Deficiency of other specified B group vitamins: Secondary | ICD-10-CM | POA: Diagnosis not present

## 2023-02-07 DIAGNOSIS — E785 Hyperlipidemia, unspecified: Secondary | ICD-10-CM

## 2023-02-07 DIAGNOSIS — E890 Postprocedural hypothyroidism: Secondary | ICD-10-CM

## 2023-02-07 DIAGNOSIS — I340 Nonrheumatic mitral (valve) insufficiency: Secondary | ICD-10-CM

## 2023-02-07 DIAGNOSIS — E559 Vitamin D deficiency, unspecified: Secondary | ICD-10-CM

## 2023-02-07 DIAGNOSIS — I10 Essential (primary) hypertension: Secondary | ICD-10-CM

## 2023-02-07 DIAGNOSIS — D649 Anemia, unspecified: Secondary | ICD-10-CM | POA: Diagnosis not present

## 2023-02-07 DIAGNOSIS — R32 Unspecified urinary incontinence: Secondary | ICD-10-CM

## 2023-02-07 NOTE — Assessment & Plan Note (Signed)
Well-controlled on enalapril.

## 2023-02-07 NOTE — Assessment & Plan Note (Signed)
CBC today to reassess.

## 2023-02-07 NOTE — Assessment & Plan Note (Addendum)
TSH today to assess.  Has been stable.  Continue current dosing of levothyroxine.

## 2023-02-07 NOTE — Assessment & Plan Note (Signed)
Lipid panel today to reassess.

## 2023-02-07 NOTE — Assessment & Plan Note (Signed)
Patient would like to see cardiology again.  Placing referral.  May need to have GI involvement given difficulty with narrowing of her esophagus per patient report.

## 2023-02-07 NOTE — Progress Notes (Signed)
Subjective:  Patient ID: [name], female    DOB: 01-Apr-1946  Age: 77 y.o. MRN: [id number]  CC: Chief Complaint  Patient presents with   Follow-up    HPI:  77 year old female with hypertension, mitral regurg/abnormal echo, recent hip replacement, history aggressive bypass, history of thyroid cancer, hyperlipidemia presents for follow-up.  Doing well following hip replacement.  Needs labs drawn to reassess anemia.  Patient had an abnormal echocardiogram prior to having surgery.  She was supposed to have a TEE and this was aborted.  This needs to be reassessed by cardiology.  Patient's hypertension is well-controlled on enalapril.  Patient reports that she continues to have difficulty with urinary incontinence.  Oxybutynin did not help.  She would like a referral to urology.  Patient Active Problem List   Diagnosis Date Noted   Anemia 02/07/2023   Status post total replacement of left hip 11/21/2022   MR (mitral regurgitation) 11/08/2022   Murmur, cardiac 11/01/2022   GERD (gastroesophageal reflux disease) 05/11/2022   Urge incontinence 02/21/2022   B12 deficiency 11/10/2021   Vitamin D deficiency 11/10/2021   Status post total hip replacement, right 12/22/2020   History of gastric bypass 11/02/2020   History of thyroid cancer 10/22/2020   Hypothyroidism 05/22/2016   Essential hypertension, benign 09/09/2013   Hyperlipidemia 09/09/2013    Social Hx   Social History   Socioeconomic History   Marital status: Widowed    Spouse name: Not on file   Number of children: Not on file   Years of education: Not on file   Highest education level: Not on file  Occupational History   Occupation: Retired Therapist, art  Tobacco Use   Smoking status: Never   Smokeless tobacco: Never  Vaping Use   Vaping Use: Never used  Substance and Sexual Activity   Alcohol use: No   Drug use: No   Sexual activity: Not on file  Other Topics Concern   Not on file  Social  History Narrative   Not on file   Social Determinants of Health   Financial Resource Strain: [place]  (02/15/2022)   Overall Financial Resource Strain (CARDIA)    Difficulty of Paying Living Expenses: Not very hard  Food Insecurity: No Food Insecurity (11/21/2022)   Hunger Vital Sign    Worried About Running Out of Food in the Last Year: Never true    Ran Out of Food in the Last Year: Never true  Transportation Needs: No Transportation Needs (11/21/2022)   PRAPARE - Hydrologist (Medical): No    Lack of Transportation (Non-Medical): No  Physical Activity: Insufficiently Active (02/15/2022)   Exercise Vital Sign    Days of Exercise per Week: 1 day    Minutes of Exercise per Session: 10 min  Stress: No Stress Concern Present (02/15/2022)   [place]    Feeling of Stress : Not at all  Social Connections: Moderately Integrated (02/15/2022)   Social Connection and Isolation Panel [NHANES]    Frequency of Communication with Friends and Family: More than three times a week    Frequency of Social Gatherings with Friends and Family: Three times a week    Attends Religious Services: More than 4 times per year    Active Member of Clubs or Organizations: Yes    Attends Archivist Meetings: More than 4 times per year    Marital Status: Widowed    Review  of Systems Per HPI  Objective:  BP 129/81   Pulse 60   Wt 167 lb 9.6 oz (76 kg)   SpO2 99%   BMI 26.25 kg/m      02/07/2023    8:19 AM 11/22/2022    8:00 PM 11/22/2022    2:25 PM  BP/Weight  Systolic BP [id number]  Diastolic BP 81 71 60  Wt. (Lbs) 167.6    BMI 26.25 kg/m2      Physical Exam Vitals and nursing note reviewed.  Constitutional:      Appearance: Normal appearance.  HENT:     Head: Normocephalic and atraumatic.  Eyes:     General:        Right eye: No discharge.        Left eye: No discharge.      Conjunctiva/sclera: Conjunctivae normal.  Cardiovascular:     Rate and Rhythm: Normal rate and regular rhythm.     Heart sounds: Murmur heard.  Pulmonary:     Effort: Pulmonary effort is normal.     Breath sounds: Normal breath sounds. No wheezing or rales.  Abdominal:     General: There is no distension.     Palpations: Abdomen is soft.     Tenderness: There is no abdominal tenderness.  Neurological:     Mental Status: She is alert.  Psychiatric:        Mood and Affect: Mood normal.        Behavior: Behavior normal.     Lab Results  Component Value Date   WBC 7.9 11/22/2022   HGB 9.4 (L) 11/22/2022   HCT 29.0 (L) 11/22/2022   PLT 183 11/22/2022   GLUCOSE 83 10/27/2022   CHOL 180 01/11/2022   TRIG 119 01/11/2022   HDL 57 01/11/2022   LDLCALC 102 (H) 01/11/2022   ALT 18 01/11/2022   AST 19 01/11/2022   NA 142 10/27/2022   K 3.7 10/27/2022   CL 114 (H) 10/27/2022   CREATININE 0.88 10/27/2022   BUN 14 10/27/2022   CO2 22 10/27/2022   TSH 0.979 06/30/2017   INR 1.0 12/19/2019     Assessment & Plan:   Problem List Items Addressed This Visit       Cardiovascular and Mediastinum   MR (mitral regurgitation) (Chronic)    Patient would like to see cardiology again.  Placing referral.  May need to have GI involvement given difficulty with narrowing of her esophagus per patient report.      Relevant Orders   Ambulatory referral to Cardiology   Essential hypertension, benign (Chronic)    Well-controlled on enalapril.      Relevant Orders   CMP14+EGFR     Endocrine   Hypothyroidism    TSH today to assess.  Has been stable.  Continue current dosing of levothyroxine.      Relevant Orders   TSH     Other   Vitamin D deficiency   Relevant Orders   Vitamin D, 25-hydroxy   Urge incontinence    Referring to urology.      Hyperlipidemia    Lipid panel today to reassess.      Relevant Orders   Lipid panel   B12 deficiency   Relevant Orders   Vitamin B12    Anemia    CBC today to reassess.      Relevant Orders   CBC   Other Visit Diagnoses     Urinary incontinence, unspecified type  Relevant Orders   Ambulatory referral to Urology      Follow-up:  3-6 months  [place]

## 2023-02-07 NOTE — Patient Instructions (Signed)
Labs today.  Referrals placed.  Follow up in 3-6 months.

## 2023-02-07 NOTE — Assessment & Plan Note (Signed)
Referring to urology.

## 2023-02-08 LAB — CMP14+EGFR
ALT: 16 IU/L (ref 0–32)
AST: 13 IU/L (ref 0–40)
Albumin/Globulin Ratio: 1.8 (ref 1.2–2.2)
Albumin: 3.9 g/dL (ref 3.8–4.8)
Alkaline Phosphatase: 80 IU/L (ref 44–121)
BUN/Creatinine Ratio: 22 (ref 12–28)
BUN: 21 mg/dL (ref 8–27)
Bilirubin Total: 0.4 mg/dL (ref 0.0–1.2)
CO2: 20 mmol/L (ref 20–29)
Calcium: 9 mg/dL (ref 8.7–10.3)
Chloride: 106 mmol/L (ref 96–106)
Creatinine, Ser: 0.97 mg/dL (ref 0.57–1.00)
Globulin, Total: 2.2 g/dL (ref 1.5–4.5)
Glucose: 81 mg/dL (ref 70–99)
Potassium: 4.1 mmol/L (ref 3.5–5.2)
Sodium: 141 mmol/L (ref 134–144)
Total Protein: 6.1 g/dL (ref 6.0–8.5)
eGFR: 60 mL/min/{1.73_m2} (ref >59)

## 2023-02-08 LAB — VITAMIN D 25 HYDROXY (VIT D DEFICIENCY, FRACTURES): Vit D, 25-Hydroxy: 28.9 ng/mL — ABNORMAL LOW (ref 30.0–100.0)

## 2023-02-08 LAB — LIPID PANEL
Chol/HDL Ratio: 2.6 ratio (ref 0.0–4.4)
Cholesterol, Total: 171 mg/dL (ref 100–199)
HDL: 66 mg/dL (ref >39)
LDL Chol Calc (NIH): 90 mg/dL (ref 0–99)
Triglycerides: 82 mg/dL (ref 0–149)
VLDL Cholesterol Cal: 15 mg/dL (ref 5–40)

## 2023-02-08 LAB — CBC
Hematocrit: 36.9 % (ref 34.0–46.6)
Hemoglobin: 12.3 g/dL (ref 11.1–15.9)
MCH: 29.4 pg (ref 26.6–33.0)
MCHC: 33.3 g/dL (ref 31.5–35.7)
MCV: 88 fL (ref 79–97)
Platelets: 212 10*3/uL (ref 150–450)
RBC: 4.19 x10E6/uL (ref 3.77–5.28)
RDW: 13.9 % (ref 11.7–15.4)
WBC: 4.7 10*3/uL (ref 3.4–10.8)

## 2023-02-08 LAB — VITAMIN B12: Vitamin B-12: 137 pg/mL — ABNORMAL LOW (ref 232–1245)

## 2023-02-08 LAB — TSH: TSH: 2.86 u[IU]/mL (ref 0.450–4.500)

## 2023-02-10 ENCOUNTER — Telehealth: Payer: Self-pay | Admitting: Internal Medicine

## 2023-02-10 NOTE — Telephone Encounter (Signed)
Patient would like to switch from Dr. Dellia Cloud to Dr. Domenic Polite. Are you both in agreement with this?

## 2023-02-10 NOTE — Telephone Encounter (Signed)
  Patient would like to switch from Dr Dellia Cloud to Dr Harrington Challenger. Is willing to see Ross in [place] or [place]. Mallipeddi has approved a switch from her previously. Please advise.

## 2023-02-15 ENCOUNTER — Ambulatory Visit: Payer: Medicare HMO | Admitting: Orthopaedic Surgery

## 2023-02-15 ENCOUNTER — Telehealth: Payer: Self-pay | Admitting: *Deleted

## 2023-02-15 DIAGNOSIS — Z96642 Presence of left artificial hip joint: Secondary | ICD-10-CM

## 2023-02-15 NOTE — Telephone Encounter (Signed)
Ortho bundle 90 day in office meeting completed; No further case management needed.

## 2023-02-15 NOTE — Progress Notes (Signed)
Post-Op Visit Note   Patient: [name]           Date of Birth: 08-21-1946           MRN: [id number] Visit Date: 02/15/2023 PCP: Coral Spikes, DO   Assessment & Plan:  Chief Complaint:  Chief Complaint  Patient presents with   Left Hip - Follow-up    Left total hip arthroplasty 11/21/2022   Visit Diagnoses:  1. Status post total replacement of left hip     Plan: [place] is now 3 months s/p left THA.  Doing really well and has no complaints.    Examination is unremarkable. Fully healed scar.    Follow up in 3 months for recheck with repeat pelvis xrays.  Dental prophylaxis reinforced.  Follow-Up Instructions: Return in about 3 months (around 05/18/2023).   Orders:  No orders of the defined types were placed in this encounter.  No orders of the defined types were placed in this encounter.   Imaging: No results found.  PMFS History: Patient Active Problem List   Diagnosis Date Noted   Anemia 02/07/2023   Status post total replacement of left hip 11/21/2022   MR (mitral regurgitation) 11/08/2022   Murmur, cardiac 11/01/2022   GERD (gastroesophageal reflux disease) 05/11/2022   Urge incontinence 02/21/2022   B12 deficiency 11/10/2021   Vitamin D deficiency 11/10/2021   Status post total hip replacement, right 12/22/2020   History of gastric bypass 11/02/2020   History of thyroid cancer 10/22/2020   Hypothyroidism 05/22/2016   Essential hypertension, benign 09/09/2013   Hyperlipidemia 09/09/2013   Past Medical History:  Diagnosis Date   Arthritis    Cancer ([place])    Thyroid cancer   COVID-19 virus infection 12/2020   Family history of adverse reaction to anesthesia    mother "couldn't wake up good" and would throw up   GERD (gastroesophageal reflux disease)    Heart murmur    History of kidney stones    Hypercholesteremia    resolved after gastric bypass   Hypertension    Hypothyroidism     Family History  Problem Relation Age of Onset    Breast cancer Mother    Pneumonia Mother 86   CAD Father 25   Breast cancer Sister    CAD Sister 43   Other Maternal Grandmother 65   CAD Brother 70   Other Brother 88       drug overdose   CAD Other 32   Colon cancer Neg Hx     Past Surgical History:  Procedure Laterality Date   ABDOMINAL HYSTERECTOMY     BILATERAL SALPINGOOPHORECTOMY     BREAST EXCISIONAL BIOPSY Bilateral 1995   benign   CHOLECYSTECTOMY     COLONOSCOPY  11/14/2011   Procedure: COLONOSCOPY;  Surgeon: Daneil Dolin, MD;  [facility];  Service: Endoscopy;  Laterality: N/A;   REPLACEMENT TOTAL KNEE BILATERAL     ROUX-EN-Y PROCEDURE  2007   TEE WITHOUT CARDIOVERSION N/A 11/11/2022   Procedure: TRANSESOPHAGEAL ECHOCARDIOGRAM (TEE);  Surgeon: Chalmers Guest, MD;  Location: [facility];  Service: Cardiovascular;  Laterality: N/A;   TONSILLECTOMY     TOTAL HIP ARTHROPLASTY Right 12/23/2019   Procedure: RIGHT TOTAL HIP ARTHROPLASTY ANTERIOR APPROACH;  Surgeon: Leandrew Koyanagi, MD;  Location: [place] [facility];  Service: Orthopedics;  Laterality: Right;   TOTAL HIP ARTHROPLASTY Left 11/21/2022   Procedure: LEFT TOTAL HIP ARTHROPLASTY ANTERIOR APPROACH;  Surgeon: Leandrew Koyanagi, MD;  Location: [place];  Service: Orthopedics;  Laterality: Left;  3-C   TOTAL THYROIDECTOMY  12/2010   TUBAL LIGATION     Social History   Occupational History   Occupation: Retired Therapist, art  Tobacco Use   Smoking status: Never   Smokeless tobacco: Never  Vaping Use   Vaping Use: Never used  Substance and Sexual Activity   Alcohol use: No   Drug use: No   Sexual activity: Not on file

## 2023-02-20 DIAGNOSIS — H2511 Age-related nuclear cataract, right eye: Secondary | ICD-10-CM | POA: Diagnosis not present

## 2023-02-24 DIAGNOSIS — R69 Illness, unspecified: Secondary | ICD-10-CM | POA: Diagnosis not present

## 2023-03-08 DIAGNOSIS — H2511 Age-related nuclear cataract, right eye: Secondary | ICD-10-CM | POA: Diagnosis not present

## 2023-03-08 DIAGNOSIS — H269 Unspecified cataract: Secondary | ICD-10-CM | POA: Diagnosis not present

## 2023-03-20 ENCOUNTER — Telehealth: Payer: Self-pay

## 2023-03-20 NOTE — Telephone Encounter (Signed)
Return call to patient. Patient states that she concern about appt for Botox and if this will be a consulted or will she be getting the Botox injection during her appt. Patient is aware that since this is her first appt it will be a Research scientist (medical) and MD will discussed options during her first initial appt. Patient voiced understanding.

## 2023-03-22 DIAGNOSIS — H2512 Age-related nuclear cataract, left eye: Secondary | ICD-10-CM | POA: Diagnosis not present

## 2023-03-22 DIAGNOSIS — H269 Unspecified cataract: Secondary | ICD-10-CM | POA: Diagnosis not present

## 2023-03-23 ENCOUNTER — Telehealth: Payer: Self-pay | Admitting: Family Medicine

## 2023-03-23 NOTE — Telephone Encounter (Signed)
Contacted [name] to schedule their annual wellness visit. Patient declined to schedule AWV at this time.  Per cancellation note -patient stated Medicare completed  Thank you,  Judeth Cornfield,  AMB Clinical Support [facility] AWV Program Direct Dial ??[phone]

## 2023-03-24 ENCOUNTER — Ambulatory Visit: Payer: Medicare HMO | Admitting: Urology

## 2023-03-24 ENCOUNTER — Encounter: Payer: Self-pay | Admitting: Urology

## 2023-03-24 VITALS — BP 151/95 | HR 70

## 2023-03-24 DIAGNOSIS — R32 Unspecified urinary incontinence: Secondary | ICD-10-CM

## 2023-03-24 LAB — URINALYSIS, ROUTINE W REFLEX MICROSCOPIC
Bilirubin, UA: NEGATIVE
Glucose, UA: NEGATIVE
Ketones, UA: NEGATIVE
Leukocytes,UA: NEGATIVE
Nitrite, UA: NEGATIVE
Protein,UA: NEGATIVE
RBC, UA: NEGATIVE
Specific Gravity, UA: 1.015 (ref 1.005–1.030)
Urobilinogen, Ur: 0.2 mg/dL (ref 0.2–1.0)
pH, UA: 5.5 (ref 5.0–7.5)

## 2023-03-24 LAB — BLADDER SCAN AMB NON-IMAGING: Scan Result: 1

## 2023-03-24 MED ORDER — MIRABEGRON ER 25 MG PO TB24: 25.0000 mg | ORAL_TABLET | Freq: Every day | ORAL | 0 refills | Status: DC

## 2023-03-24 NOTE — Progress Notes (Unsigned)
03/24/2023 10:46 AM   Carlis Stable [birth year] [id number]  Referring provider: Tommie Sams, DO [address] [place],  [place] [address]  Urinary incontinence  HPI: Ms [name] is a 77yo here for evaluation of urge incontinence. She has had issues with urge incontinence for 10 years. She uses 3-4 pads per day. She has nocturia 2-3x. No hx of DMII. NO straining to urinate. PVR 1cc.    PMH: Past Medical History:  Diagnosis Date   Arthritis    Cancer ([facility])    Thyroid cancer   COVID-19 virus infection 12/2020   Family history of adverse reaction to anesthesia    mother "couldn't wake up good" and would throw up   GERD (gastroesophageal reflux disease)    Heart murmur    History of kidney stones    Hypercholesteremia    resolved after gastric bypass   Hypertension    Hypothyroidism     Surgical History: Past Surgical History:  Procedure Laterality Date   ABDOMINAL HYSTERECTOMY     BILATERAL SALPINGOOPHORECTOMY     BREAST EXCISIONAL BIOPSY Bilateral 1995   benign   CHOLECYSTECTOMY     COLONOSCOPY  11/14/2011   Procedure: COLONOSCOPY;  Surgeon: Corbin Ade, MD;  [facility];  Service: Endoscopy;  Laterality: N/A;   REPLACEMENT TOTAL KNEE BILATERAL     ROUX-EN-Y PROCEDURE  2007   TEE WITHOUT CARDIOVERSION N/A 11/11/2022   Procedure: TRANSESOPHAGEAL ECHOCARDIOGRAM (TEE);  Surgeon: Marjo Bicker, MD;  Location[facility];  Service: Cardiovascular;  Laterality: N/A;   TONSILLECTOMY     TOTAL HIP ARTHROPLASTY Right 12/23/2019   Procedure: RIGHT TOTAL HIP ARTHROPLASTY ANTERIOR APPROACH;  Surgeon: Tarry Kos, MD;  [facility];  Service: Orthopedics;  Laterality: Right;   TOTAL HIP ARTHROPLASTY Left 11/21/2022   Procedure: LEFT TOTAL HIP ARTHROPLASTY ANTERIOR APPROACH;  Surgeon: Tarry Kos, MD;  Location[facility];  Service: Orthopedics;  Laterality: Left;  3-C   TOTAL THYROIDECTOMY  12/2010   TUBAL LIGATION      Home Medications:  Allergies  as of 03/24/2023       Reactions   Codeine Nausea And Vomiting   Demerol Nausea And Vomiting   Nsaids Other (See Comments)   Due to gastric bypass   Quinine Other (See Comments)   Unknown reaction type (patient is unable to recall reaction type)        Medication List        Accurate as of March 24, 2023 10:46 AM. If you have any questions, ask your nurse or doctor.          amoxicillin 500 MG capsule Commonly known as: AMOXIL Take 2,000 mg by mouth See admin instructions. Take 4 capsules (2000 mg) by mouth 1 hour prior to dental appointments   CALCIUM CITRATE+D3 PO Take 630 mg by mouth 2 (two) times daily.   carboxymethylcellulose 0.5 % Soln Commonly known as: REFRESH PLUS Place 1 drop into both eyes in the morning and at bedtime.   enalapril 10 MG tablet Commonly known as: VASOTEC TAKE 1 TABLET AT BEDTIME   famotidine 40 MG tablet Commonly known as: PEPCID TAKE 1 TABLET DAILY (CANCELRANTITIDINE)   ferrous sulfate 325 (65 FE) MG tablet Take 325 mg by mouth daily in the afternoon.   levothyroxine 150 MCG tablet Commonly known as: Synthroid Take 1 tablet (150 mcg total) by mouth daily before breakfast.   MAGNESIUM PO Take 1 tablet by mouth daily as  needed (leg/feet cramps).   multivitamin with minerals Tabs tablet Take 1 tablet by mouth in the morning and at bedtime.   valACYclovir 1000 MG tablet Commonly known as: VALTREX TAKE 2 TABLETS AT FIRST SIGN OF FEVER BLISTER, THEN 2 TABLETS 2 HOURSLATER What changed:  how much to take how to take this when to take this reasons to take this additional instructions   vitamin B-12 500 MCG tablet Commonly known as: CYANOCOBALAMIN Take 500 mcg by mouth daily in the afternoon.   VITAMIN D PO Take 2,000 Units by mouth in the morning and at bedtime.   ZINC PO Take 1 tablet by mouth daily as needed (leg/feet cramps).        Allergies:  Allergies  Allergen Reactions   Codeine Nausea And Vomiting    Demerol Nausea And Vomiting   Nsaids Other (See Comments)    Due to gastric bypass   Quinine Other (See Comments)    Unknown reaction type (patient is unable to recall reaction type)    Family History: Family History  Problem Relation Age of Onset   Breast cancer Mother    Pneumonia Mother 52   CAD Father 28   Breast cancer Sister    CAD Sister 47   Other Maternal Grandmother 2   CAD Brother 85   Other Brother 78       drug overdose   CAD Other 32   Colon cancer Neg Hx     Social History:  reports that she has never smoked. She has never used smokeless tobacco. She reports that she does not drink alcohol and does not use drugs.  ROS: All other review of systems were reviewed and are negative except what is noted above in HPI  Physical Exam: BP (!) 151/95   Pulse 70   Constitutional:  Alert and oriented, No acute distress. HEENT: [place] AT, moist mucus membranes.  Trachea midline, no masses. Cardiovascular: No clubbing, cyanosis, or edema. Respiratory: Normal respiratory effort, no increased work of breathing. GI: Abdomen is soft, nontender, nondistended, no abdominal masses GU: No CVA tenderness.  Lymph: No cervical or inguinal lymphadenopathy. Skin: No rashes, bruises or suspicious lesions. Neurologic: Grossly intact, no focal deficits, moving all 4 extremities. Psychiatric: Normal mood and affect.  Laboratory Data: Lab Results  Component Value Date   WBC 4.7 02/07/2023   HGB 12.3 02/07/2023   HCT 36.9 02/07/2023   MCV 88 02/07/2023   PLT 212 02/07/2023    Lab Results  Component Value Date   CREATININE 0.97 02/07/2023    No results found for: "PSA"  No results found for: "TESTOSTERONE"  No results found for: "HGBA1C"  Urinalysis    Component Value Date/Time   COLORURINE YELLOW 12/24/2010 0945   APPEARANCEUR CLOUDY (A) 12/24/2010 0945   LABSPEC 1.018 12/24/2010 0945   PHURINE 5.5 12/24/2010 0945   HGBUR NEGATIVE 12/24/2010 0945   BILIRUBINUR  NEGATIVE 12/24/2010 0945   KETONESUR NEGATIVE 12/24/2010 0945   PROTEINUR NEGATIVE 12/24/2010 0945   UROBILINOGEN 0.2 12/24/2010 0945   NITRITE NEGATIVE 12/24/2010 0945   LEUKOCYTESUR  12/24/2010 0945    NEGATIVE MICROSCOPIC NOT DONE ON URINES WITH NEGATIVE PROTEIN, BLOOD, LEUKOCYTES, NITRITE, OR GLUCOSE <1000 mg/dL.    No results found for: "LABMICR", "WBCUA", "RBCUA", "LABEPIT", "MUCUS", "BACTERIA"  Pertinent Imaging: *** No results found for this or any previous visit.  No results found for this or any previous visit.  No results found for this or any previous visit.  No results  found for this or any previous visit.  No results found for this or any previous visit.  No valid procedures specified. No results found for this or any previous visit.  No results found for this or any previous visit.   Assessment & Plan:    1. Urinary incontinence, unspecified type *** - Urinalysis, Routine w reflex microscopic - BLADDER SCAN AMB NON-IMAGING   No follow-ups on file.  Wilkie Aye, MD  [facility] [place]

## 2023-03-24 NOTE — Progress Notes (Signed)
post void residual=1 

## 2023-03-24 NOTE — Patient Instructions (Signed)

## 2023-04-06 ENCOUNTER — Encounter: Payer: Self-pay | Admitting: *Deleted

## 2023-04-27 ENCOUNTER — Telehealth: Payer: Self-pay

## 2023-04-27 NOTE — Telephone Encounter (Signed)
Patient needing samples of  mirabegron ER (MYRBETRIQ) 25 MG TB24 tablet  Thank you.

## 2023-04-27 NOTE — Telephone Encounter (Signed)
1 month supply of samples provided

## 2023-05-01 ENCOUNTER — Ambulatory Visit: Payer: Medicare HMO | Admitting: Internal Medicine

## 2023-05-02 ENCOUNTER — Ambulatory Visit: Payer: Medicare HMO | Attending: Internal Medicine | Admitting: Internal Medicine

## 2023-05-02 ENCOUNTER — Encounter: Payer: Self-pay | Admitting: Internal Medicine

## 2023-05-02 VITALS — BP 136/78 | HR 68 | Ht 67.0 in | Wt 168.0 lb

## 2023-05-02 DIAGNOSIS — I340 Nonrheumatic mitral (valve) insufficiency: Secondary | ICD-10-CM

## 2023-05-02 NOTE — Patient Instructions (Signed)
Medication Instructions:  Your physician recommends that you continue on your current medications as directed. Please refer to the Current Medication list given to you today.  *If you need a refill on your cardiac medications before your next appointment, please call your pharmacy*   Lab Work: NONE   If you have labs (blood work) drawn today and your tests are completely normal, you will receive your results only by: MyChart Message (if you have MyChart) OR A paper copy in the mail If you have any lab test that is abnormal or we need to change your treatment, we will call you to review the results.   Testing/Procedures: NONE    Follow-Up: At [facility], you and your health needs are our priority.  As part of our continuing mission to provide you with exceptional heart care, we have created designated Provider Care Teams.  These Care Teams include your primary Cardiologist (physician) and Advanced Practice Providers (APPs -  Physician Assistants and Nurse Practitioners) who all work together to provide you with the care you need, when you need it.  We recommend signing up for the patient portal called "MyChart".  Sign up information is provided on this After Visit Summary.  MyChart is used to connect with patients for Virtual Visits (Telemedicine).  Patients are able to view lab/test results, encounter notes, upcoming appointments, etc.  Non-urgent messages can be sent to your provider as well.   To learn more about what you can do with MyChart, go to [email or web address].    Your next appointment:    To Be Determined   Provider:   Dietrich Pates, MD    Other Instructions Thank you for choosing [facility]!

## 2023-05-02 NOTE — Progress Notes (Signed)
Cardiology Office Note   Date:  05/02/2023   ID:  [name], [name] 11-Mar-1946, MRN [id number]  PCP:  Tommie Sams, DO  Cardiologist:   Dietrich Pates, MD   Patient presents for follow up of mitral regurgitation    History of Present Illness: [name] is a 77 y.o. 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  Unable to advance TEE even with video glidescope    The pt presents today for follow up   She says her breathing is OK  She denies palpitations   No CP     She is not very active  Says she bakes  The pt is s/p gastric bypass remotely   Has followed in GI    No problems swallowing      Current Meds  Medication Sig   amoxicillin (AMOXIL) 500 MG capsule Take 2,000 mg by mouth See admin instructions. Take 4 capsules (2000 mg) by mouth 1 hour prior to dental appointments   Calcium Citrate-Vitamin D (CALCIUM CITRATE+D3 PO) Take 630 mg by mouth 2 (two) times daily.   carboxymethylcellulose (REFRESH PLUS) 0.5 % SOLN Place 1 drop into both eyes in the morning and at bedtime.   enalapril (VASOTEC) 10 MG tablet TAKE 1 TABLET AT BEDTIME   famotidine (PEPCID) 40 MG tablet TAKE 1 TABLET DAILY (CANCELRANTITIDINE)   ferrous sulfate 325 (65 FE) MG tablet Take 325 mg by mouth daily in the afternoon.   levothyroxine (SYNTHROID) 150 MCG tablet Take 1 tablet (150 mcg total) by mouth daily before breakfast.   MAGNESIUM PO Take 1 tablet by mouth daily as needed (leg/feet cramps).   mirabegron ER (MYRBETRIQ) 25 MG TB24 tablet Take 1 tablet (25 mg total) by mouth daily.   Multiple Vitamin (MULTIVITAMIN WITH MINERALS) TABS tablet Take 1 tablet by mouth in the morning and at bedtime.   Multiple Vitamins-Minerals (ZINC PO) Take 1 tablet by  mouth daily as needed (leg/feet cramps).   valACYclovir (VALTREX) 1000 MG tablet TAKE 2 TABLETS AT FIRST SIGN OF FEVER BLISTER, THEN 2 TABLETS 2 HOURSLATER (Patient taking differently: Take 2,000 mg by mouth 2 (two) times daily as needed (fever blisters.).)   vitamin B-12 (CYANOCOBALAMIN) 500 MCG tablet Take 500 mcg by mouth daily in the afternoon.   VITAMIN D PO Take 2,000 Units by mouth in the morning and at bedtime.     Allergies:   Codeine, Demerol, Nsaids, and Quinine   Past Medical History:  Diagnosis Date   Arthritis    Cancer (HCC)    Thyroid cancer   COVID-19 virus infection 12/2020   Family history of adverse reaction to anesthesia    mother "couldn't wake up good" and would throw up   GERD (gastroesophageal reflux disease)    Heart murmur    History of kidney stones    Hypercholesteremia    resolved after gastric bypass   Hypertension    Hypothyroidism     Past Surgical History:  Procedure Laterality Date   ABDOMINAL HYSTERECTOMY     BILATERAL SALPINGOOPHORECTOMY     BREAST EXCISIONAL BIOPSY Bilateral 1995   benign   CHOLECYSTECTOMY     COLONOSCOPY  11/14/2011   Procedure: COLONOSCOPY;  Surgeon: Corbin Ade, MD;  [facility];  Service: Endoscopy;  Laterality: N/A;   REPLACEMENT TOTAL KNEE BILATERAL     ROUX-EN-Y PROCEDURE  2007   TEE WITHOUT CARDIOVERSION N/A 11/11/2022   Procedure: TRANSESOPHAGEAL ECHOCARDIOGRAM (TEE);  Surgeon: Marjo Bicker, MD;  Location: [facility];  Service: Cardiovascular;  Laterality: N/A;   TONSILLECTOMY     TOTAL HIP ARTHROPLASTY Right 12/23/2019   Procedure: RIGHT TOTAL HIP ARTHROPLASTY ANTERIOR APPROACH;  Surgeon: Tarry Kos, MD;  Location[facility];  Service: Orthopedics;  Laterality: Right;   TOTAL HIP ARTHROPLASTY Left 11/21/2022   Procedure: LEFT TOTAL HIP ARTHROPLASTY ANTERIOR APPROACH;  Surgeon: Tarry Kos, MD;  Location: [facility];  Service: Orthopedics;  Laterality: Left;  3-C   TOTAL THYROIDECTOMY  12/2010    TUBAL LIGATION       Social History:  The patient  reports that she has never smoked. She has never used smokeless tobacco. She reports that she does not drink alcohol and does not use drugs.   Family History:  The patient's family history includes Breast cancer in her mother and sister; CAD (age of onset: 12) in an other family member; CAD (age of onset: 32) in her brother; CAD (age of onset: 52) in her father; CAD (age of onset: 64) in her sister; Other (age of onset: 2) in her brother; Other (age of onset: 67) in her maternal grandmother; Pneumonia (age of onset: 17) in her mother.    ROS:  Please see the history of present illness. All other systems are reviewed and  Negative to the above problem except as noted.    PHYSICAL EXAM: VS:  BP 136/78   Pulse 68   Ht 5\' 7"  (1.702 m)   Wt 168 lb (76.2 kg)   SpO2 99%   BMI 26.31 kg/m   GEN: Well nourished, well developed, in no acute distress  HEENT: normal  Neck: no JVD, carotid bruits Cardiac: RRR; Gr II/VI holosystolic murmur heard LSB to apex  No LE edema  Respiratory:  clear to auscultation bilaterally, normal work of breathing GI: soft, nontender No hepatomegaly   EKG:  EKG is not ordered today.   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   MR is at least moderate  The pt needs to be seen in GI for evaluatoin of esophagus so that TEE can be performed    I told the pt I would get back with her once I have contacted GI     Current  medicines are reviewed at length with the patient today.  The patient does not have concerns regarding medicines.  Signed, Dietrich Pates, MD  05/02/2023 8:04 AM    [facility] [address] [place], [place], [place]  [address] Phone: [phone]; Fax: [phone]    

## 2023-05-03 ENCOUNTER — Ambulatory Visit: Payer: Medicare HMO | Admitting: Urology

## 2023-05-03 VITALS — BP 117/80 | HR 58

## 2023-05-03 DIAGNOSIS — R32 Unspecified urinary incontinence: Secondary | ICD-10-CM | POA: Diagnosis not present

## 2023-05-03 MED ORDER — GEMTESA 75 MG PO TABS: 1.0000 | ORAL_TABLET | Freq: Every day | ORAL | 0 refills | Status: DC

## 2023-05-03 NOTE — Progress Notes (Unsigned)
05/03/2023 10:30 AM   [name] 1946/12/07 [id number]  Referring provider: Tommie Sams, DO [address] [name],  [place] [address]  No chief complaint on file.   HPI: She notes marginal improvement in her OAB symptoms with mirabegron. Nocturia 2-3x. She uses 2-3 pads a day down from 3-4x. She has been on oxybutynin previously which failed to improve her OAB symptoms.    PMH: Past Medical History:  Diagnosis Date   Arthritis    Cancer (HCC)    Thyroid cancer   COVID-19 virus infection 12/2020   Family history of adverse reaction to anesthesia    mother "couldn't wake up good" and would throw up   GERD (gastroesophageal reflux disease)    Heart murmur    History of kidney stones    Hypercholesteremia    resolved after gastric bypass   Hypertension    Hypothyroidism     Surgical History: Past Surgical History:  Procedure Laterality Date   ABDOMINAL HYSTERECTOMY     BILATERAL SALPINGOOPHORECTOMY     BREAST EXCISIONAL BIOPSY Bilateral 1995   benign   CHOLECYSTECTOMY     COLONOSCOPY  11/14/2011   Procedure: COLONOSCOPY;  Surgeon: Corbin Ade, MD;  [facility];  Service: Endoscopy;  Laterality: N/A;   REPLACEMENT TOTAL KNEE BILATERAL     ROUX-EN-Y PROCEDURE  2007   TEE WITHOUT CARDIOVERSION N/A 11/11/2022   Procedure: TRANSESOPHAGEAL ECHOCARDIOGRAM (TEE);  Surgeon: Marjo Bicker, MD;  Location: [facility];  Service: Cardiovascular;  Laterality: N/A;   TONSILLECTOMY     TOTAL HIP ARTHROPLASTY Right 12/23/2019   Procedure: RIGHT TOTAL HIP ARTHROPLASTY ANTERIOR APPROACH;  Surgeon: Tarry Kos, MD;  [facility];  Service: Orthopedics;  Laterality: Right;   TOTAL HIP ARTHROPLASTY Left 11/21/2022   Procedure: LEFT TOTAL HIP ARTHROPLASTY ANTERIOR APPROACH;  Surgeon: Tarry Kos, MD;  [facility];  Service: Orthopedics;  Laterality: Left;  3-C   TOTAL THYROIDECTOMY  12/2010   TUBAL LIGATION      Home Medications:  Allergies as of  05/03/2023       Reactions   Codeine Nausea And Vomiting   Demerol Nausea And Vomiting   Nsaids Other (See Comments)   Due to gastric bypass   Quinine Other (See Comments)   Unknown reaction type (patient is unable to recall reaction type)        Medication List        Accurate as of May 03, 2023 10:30 AM. If you have any questions, ask your nurse or doctor.          amoxicillin 500 MG capsule Commonly known as: AMOXIL Take 2,000 mg by mouth See admin instructions. Take 4 capsules (2000 mg) by mouth 1 hour prior to dental appointments   CALCIUM CITRATE+D3 PO Take 630 mg by mouth 2 (two) times daily.   carboxymethylcellulose 0.5 % Soln Commonly known as: REFRESH PLUS Place 1 drop into both eyes in the morning and at bedtime.   enalapril 10 MG tablet Commonly known as: VASOTEC TAKE 1 TABLET AT BEDTIME   famotidine 40 MG tablet Commonly known as: PEPCID TAKE 1 TABLET DAILY (CANCELRANTITIDINE)   ferrous sulfate 325 (65 FE) MG tablet Take 325 mg by mouth daily in the afternoon.   levothyroxine 150 MCG tablet Commonly known as: Synthroid Take 1 tablet (150 mcg total) by mouth daily before breakfast.   MAGNESIUM PO Take 1 tablet by mouth daily as needed (leg/feet cramps).  mirabegron ER 25 MG Tb24 tablet Commonly known as: MYRBETRIQ Take 1 tablet (25 mg total) by mouth daily.   multivitamin with minerals Tabs tablet Take 1 tablet by mouth in the morning and at bedtime.   valACYclovir 1000 MG tablet Commonly known as: VALTREX TAKE 2 TABLETS AT FIRST SIGN OF FEVER BLISTER, THEN 2 TABLETS 2 HOURSLATER What changed:  how much to take how to take this when to take this reasons to take this additional instructions   vitamin B-12 500 MCG tablet Commonly known as: CYANOCOBALAMIN Take 500 mcg by mouth daily in the afternoon.   VITAMIN D PO Take 2,000 Units by mouth in the morning and at bedtime.   ZINC PO Take 1 tablet by mouth daily as needed (leg/feet  cramps).        Allergies:  Allergies  Allergen Reactions   Codeine Nausea And Vomiting   Demerol Nausea And Vomiting   Nsaids Other (See Comments)    Due to gastric bypass   Quinine Other (See Comments)    Unknown reaction type (patient is unable to recall reaction type)    Family History: Family History  Problem Relation Age of Onset   Breast cancer Mother    Pneumonia Mother 62   CAD Father 37   Breast cancer Sister    CAD Sister 27   Other Maternal Grandmother 67   CAD Brother 67   Other Brother 72       drug overdose   CAD Other 32   Colon cancer Neg Hx     Social History:  reports that she has never smoked. She has never used smokeless tobacco. She reports that she does not drink alcohol and does not use drugs.  ROS: All other review of systems were reviewed and are negative except what is noted above in HPI  Physical Exam: BP 117/80   Pulse (!) 58   Constitutional:  Alert and oriented, No acute distress. HEENT: [place] AT, moist mucus membranes.  Trachea midline, no masses. Cardiovascular: No clubbing, cyanosis, or edema. Respiratory: Normal respiratory effort, no increased work of breathing. GI: Abdomen is soft, nontender, nondistended, no abdominal masses GU: No CVA tenderness.  Lymph: No cervical or inguinal lymphadenopathy. Skin: No rashes, bruises or suspicious lesions. Neurologic: Grossly intact, no focal deficits, moving all 4 extremities. Psychiatric: Normal mood and affect.  Laboratory Data: Lab Results  Component Value Date   WBC 4.7 02/07/2023   HGB 12.3 02/07/2023   HCT 36.9 02/07/2023   MCV 88 02/07/2023   PLT 212 02/07/2023    Lab Results  Component Value Date   CREATININE 0.97 02/07/2023    No results found for: "PSA"  No results found for: "TESTOSTERONE"  No results found for: "HGBA1C"  Urinalysis    Component Value Date/Time   COLORURINE YELLOW 12/24/2010 0945   APPEARANCEUR Clear 03/24/2023 1012   LABSPEC 1.018  12/24/2010 0945   PHURINE 5.5 12/24/2010 0945   GLUCOSEU Negative 03/24/2023 1012   HGBUR NEGATIVE 12/24/2010 0945   BILIRUBINUR Negative 03/24/2023 1012   KETONESUR NEGATIVE 12/24/2010 0945   PROTEINUR Negative 03/24/2023 1012   PROTEINUR NEGATIVE 12/24/2010 0945   UROBILINOGEN 0.2 12/24/2010 0945   NITRITE Negative 03/24/2023 1012   NITRITE NEGATIVE 12/24/2010 0945   LEUKOCYTESUR Negative 03/24/2023 1012    Lab Results  Component Value Date   LABMICR Comment 03/24/2023    Pertinent Imaging: *** No results found for this or any previous visit.  No results found for this  or any previous visit.  No results found for this or any previous visit.  No results found for this or any previous visit.  No results found for this or any previous visit.  No valid procedures specified. No results found for this or any previous visit.  No results found for this or any previous visit.   Assessment & Plan:    1. Urinary incontinence, unspecified type -Gemtesa 75mg  daily. If she fails gemtesa she will likely proceed with intravesical botox.   No follow-ups on file.  Wilkie Aye, MD  [facility] [place]

## 2023-05-04 ENCOUNTER — Encounter: Payer: Self-pay | Admitting: Urology

## 2023-05-04 DIAGNOSIS — N182 Chronic kidney disease, stage 2 (mild): Secondary | ICD-10-CM | POA: Diagnosis not present

## 2023-05-04 DIAGNOSIS — Z85850 Personal history of malignant neoplasm of thyroid: Secondary | ICD-10-CM | POA: Diagnosis not present

## 2023-05-04 DIAGNOSIS — I129 Hypertensive chronic kidney disease with stage 1 through stage 4 chronic kidney disease, or unspecified chronic kidney disease: Secondary | ICD-10-CM | POA: Diagnosis not present

## 2023-05-04 DIAGNOSIS — E785 Hyperlipidemia, unspecified: Secondary | ICD-10-CM | POA: Diagnosis not present

## 2023-05-04 DIAGNOSIS — M1990 Unspecified osteoarthritis, unspecified site: Secondary | ICD-10-CM | POA: Diagnosis not present

## 2023-05-04 DIAGNOSIS — Z008 Encounter for other general examination: Secondary | ICD-10-CM | POA: Diagnosis not present

## 2023-05-04 DIAGNOSIS — Z8249 Family history of ischemic heart disease and other diseases of the circulatory system: Secondary | ICD-10-CM | POA: Diagnosis not present

## 2023-05-04 DIAGNOSIS — G629 Polyneuropathy, unspecified: Secondary | ICD-10-CM | POA: Diagnosis not present

## 2023-05-04 DIAGNOSIS — Z96649 Presence of unspecified artificial hip joint: Secondary | ICD-10-CM | POA: Diagnosis not present

## 2023-05-04 DIAGNOSIS — R32 Unspecified urinary incontinence: Secondary | ICD-10-CM | POA: Diagnosis not present

## 2023-05-04 DIAGNOSIS — Z803 Family history of malignant neoplasm of breast: Secondary | ICD-10-CM | POA: Diagnosis not present

## 2023-05-04 DIAGNOSIS — R011 Cardiac murmur, unspecified: Secondary | ICD-10-CM | POA: Diagnosis not present

## 2023-05-04 DIAGNOSIS — K219 Gastro-esophageal reflux disease without esophagitis: Secondary | ICD-10-CM | POA: Diagnosis not present

## 2023-05-04 NOTE — Patient Instructions (Signed)

## 2023-05-05 ENCOUNTER — Ambulatory Visit: Payer: Medicare HMO | Admitting: Urology

## 2023-05-20 ENCOUNTER — Other Ambulatory Visit: Payer: Self-pay | Admitting: Family Medicine

## 2023-05-23 ENCOUNTER — Other Ambulatory Visit (INDEPENDENT_AMBULATORY_CARE_PROVIDER_SITE_OTHER): Payer: Medicare HMO

## 2023-05-23 ENCOUNTER — Ambulatory Visit: Payer: Medicare HMO | Admitting: Orthopaedic Surgery

## 2023-05-23 ENCOUNTER — Encounter: Payer: Self-pay | Admitting: Orthopaedic Surgery

## 2023-05-23 DIAGNOSIS — Z96642 Presence of left artificial hip joint: Secondary | ICD-10-CM | POA: Diagnosis not present

## 2023-05-23 NOTE — Progress Notes (Signed)
Office Visit Note   Patient: [name]           Date of Birth: 1946/11/18           MRN: [id number] Visit Date: 05/23/2023              Requested by: Tommie Sams, DO [address] [place],  [place] [address] PCP: Tommie Sams, DO   Assessment & Plan: Visit Diagnoses:  1. Status post total replacement of left hip     Plan: Micky is now 6 months status post left total hip replacement.  She has been doing great and very happy.  She has resumed all activities.  She has no complaints.  Dental prophylaxis reinforced.  Recheck in 6 months with standing [facility] pelvis x-rays.  Follow-Up Instructions: Return in about 6 months (around 11/22/2023).   Orders:  Orders Placed This Encounter  Procedures   XR Pelvis 1-2 Views   No orders of the defined types were placed in this encounter.     Procedures: No procedures performed   Clinical Data: No additional findings.   Subjective: Chief Complaint  Patient presents with   Left Hip - Follow-up    Left total hip arthroplasty 11/21/2022    HPI Shakeria is here for 15-month postop check Review of Systems   Objective: Vital Signs: There were no vitals taken for this visit.  Physical Exam  Ortho Exam Examination of the left hip shows fully healed surgical scar.  Fluid painless range of motion of the hip. Specialty Comments:  No specialty comments available.  Imaging: XR Pelvis 1-2 Views  Result Date: 05/23/2023 Stable left total hip replacement without complications    PMFS History: Patient Active Problem List   Diagnosis Date Noted   Anemia 02/07/2023   Status post total replacement of left hip 11/21/2022   MR (mitral regurgitation) 11/08/2022   Murmur, cardiac 11/01/2022   GERD (gastroesophageal reflux disease) 05/11/2022   Urge incontinence 02/21/2022   B12 deficiency 11/10/2021   Vitamin D deficiency 11/10/2021   Status post total hip replacement, right 12/22/2020   History of gastric bypass  11/02/2020   History of thyroid cancer 10/22/2020   Hypothyroidism 05/22/2016   Essential hypertension, benign 09/09/2013   Hyperlipidemia 09/09/2013   Past Medical History:  Diagnosis Date   Arthritis    Cancer (HCC)    Thyroid cancer   COVID-19 virus infection 12/2020   Family history of adverse reaction to anesthesia    mother "couldn't wake up good" and would throw up   GERD (gastroesophageal reflux disease)    Heart murmur    History of kidney stones    Hypercholesteremia    resolved after gastric bypass   Hypertension    Hypothyroidism     Family History  Problem Relation Age of Onset   Breast cancer Mother    Pneumonia Mother 72   CAD Father 8   Breast cancer Sister    CAD Sister 67   Other Maternal Grandmother 3   CAD Brother 44   Other Brother 43       drug overdose   CAD Other 32   Colon cancer Neg Hx     Past Surgical History:  Procedure Laterality Date   ABDOMINAL HYSTERECTOMY     BILATERAL SALPINGOOPHORECTOMY     BREAST EXCISIONAL BIOPSY Bilateral 1995   benign   CHOLECYSTECTOMY     COLONOSCOPY  11/14/2011   Procedure: COLONOSCOPY;  Surgeon: Gerrit Friends  Rourk, MD;  [facility];  Service: Endoscopy;  Laterality: N/A;   REPLACEMENT TOTAL KNEE BILATERAL     ROUX-EN-Y PROCEDURE  2007   TEE WITHOUT CARDIOVERSION N/A 11/11/2022   Procedure: TRANSESOPHAGEAL ECHOCARDIOGRAM (TEE);  Surgeon: Marjo Bicker, MD;  Location: [facility];  Service: Cardiovascular;  Laterality: N/A;   TONSILLECTOMY     TOTAL HIP ARTHROPLASTY Right 12/23/2019   Procedure: RIGHT TOTAL HIP ARTHROPLASTY ANTERIOR APPROACH;  Surgeon: Tarry Kos, MD;  [facility];  Service: Orthopedics;  Laterality: Right;   TOTAL HIP ARTHROPLASTY Left 11/21/2022   Procedure: LEFT TOTAL HIP ARTHROPLASTY ANTERIOR APPROACH;  Surgeon: Tarry Kos, MD;  [facility];  Service: Orthopedics;  Laterality: Left;  3-C   TOTAL THYROIDECTOMY  12/2010   TUBAL LIGATION     Social History    Occupational History   Occupation: Retired Clinical biochemist  Tobacco Use   Smoking status: Never   Smokeless tobacco: Never  Vaping Use   Vaping Use: Never used  Substance and Sexual Activity   Alcohol use: No   Drug use: No   Sexual activity: Not on file

## 2023-06-09 DIAGNOSIS — R351 Nocturia: Secondary | ICD-10-CM | POA: Insufficient documentation

## 2023-06-09 NOTE — Progress Notes (Unsigned)
[name] [birth year] [id number]  History of Present Illness: [name] is a 77 y.o. female who presents today for follow up visit at [facility]. - GU history: 1. OAB with urinary frequency, nocturia, urgency, and urge incontinence.  At last visit with Dr. Ronne Binning on 05/03/2023: - Reported no significant improvement with Myrbetriq. Also previously tried Oxybutynin without success. - The plan was: "Gemtesa 75mg  daily. If she fails gemtesa she will likely proceed with intravesical botox."  ***Nocturia 2-3x. She uses 2-3 pads a day down from 3-4x.   Today: She reports ***  She reports urinary ***frequency and ***urgency. She reports voiding *** times per day. She reports nocturia. She reports voiding *** times per night.  She {Actions; denies-reports:120008} urge incontinence.  She leaks *** times per ***day; {Actions; denies-reports:120008} wearing *** ***pads/***diapers per day.  She {Actions; denies-reports:120008} prior attempted treatment for these symptoms ***including ***  She {Actions; denies-reports:120008} routine caffeine intake (*** cups of coffee / tea / soda per day).  She {Actions; denies-reports:120008} history of obstructive sleep apnea and {Actions; denies-reports:120008} wearing CPAP routinely. ***She denies ever having a sleep study before. She {Actions; denies-reports:120008} fluid intake within 3 hours prior to bedtime. She {Actions; denies-reports:120008} fluid intake during the night.  She {Actions; denies-reports:120008} caffeine intake within 8 hours prior to bedtime. She {Actions; denies-reports:120008} monitoring their salt and sodium dietary intake. She {Actions; denies-reports:120008} routinely experiencing lower extremity edema during the day. ***She {Actions; denies-reports:120008} elevating feet during the day and/or wearing compression socks when lower extremity edema is present during the day.  She {Actions;  denies-reports:120008} dysuria, gross hematuria, straining to void, or sensations of incomplete emptying.   Fall Screening: Do you usually have a device to assist in your mobility? {yes/no:20286} ***cane / ***walker / ***wheelchair   Medications: Current Outpatient Medications  Medication Sig Dispense Refill   amoxicillin (AMOXIL) 500 MG capsule Take 2,000 mg by mouth See admin instructions. Take 4 capsules (2000 mg) by mouth 1 hour prior to dental appointments     Calcium Citrate-Vitamin D (CALCIUM CITRATE+D3 PO) Take 630 mg by mouth 2 (two) times daily.     carboxymethylcellulose (REFRESH PLUS) 0.5 % SOLN Place 1 drop into both eyes in the morning and at bedtime.     enalapril (VASOTEC) 10 MG tablet TAKE 1 TABLET AT BEDTIME 90 tablet 1   famotidine (PEPCID) 40 MG tablet TAKE 1 TABLET DAILY (CANCELRANTITIDINE) 90 tablet 1   ferrous sulfate 325 (65 FE) MG tablet Take 325 mg by mouth daily in the afternoon.     levothyroxine (SYNTHROID) 150 MCG tablet Take 1 tablet (150 mcg total) by mouth daily before breakfast. 30 tablet 0   MAGNESIUM PO Take 1 tablet by mouth daily as needed (leg/feet cramps).     Multiple Vitamin (MULTIVITAMIN WITH MINERALS) TABS tablet Take 1 tablet by mouth in the morning and at bedtime.     Multiple Vitamins-Minerals (ZINC PO) Take 1 tablet by mouth daily as needed (leg/feet cramps).     valACYclovir (VALTREX) 1000 MG tablet TAKE 2 TABLETS AT FIRST SIGN OF FEVER BLISTER, THEN 2 TABLETS 2 HOURSLATER (Patient taking differently: Take 2,000 mg by mouth 2 (two) times daily as needed (fever blisters.).) 4 tablet 5   Vibegron (GEMTESA) 75 MG TABS Take 1 tablet (75 mg total) by mouth daily. 30 tablet 0   vitamin B-12 (CYANOCOBALAMIN) 500 MCG tablet Take 500 mcg by mouth daily in the afternoon.     VITAMIN D PO Take  2,000 Units by mouth in the morning and at bedtime.     No current facility-administered medications for this visit.    Allergies: Allergies  Allergen  Reactions   Codeine Nausea And Vomiting   Demerol Nausea And Vomiting   Nsaids Other (See Comments)    Due to gastric bypass   Quinine Other (See Comments)    Unknown reaction type (patient is unable to recall reaction type)    Past Medical History:  Diagnosis Date   Arthritis    Cancer (HCC)    Thyroid cancer   COVID-19 virus infection 12/2020   Family history of adverse reaction to anesthesia    mother "couldn't wake up good" and would throw up   GERD (gastroesophageal reflux disease)    Heart murmur    History of kidney stones    Hypercholesteremia    resolved after gastric bypass   Hypertension    Hypothyroidism    Past Surgical History:  Procedure Laterality Date   ABDOMINAL HYSTERECTOMY     BILATERAL SALPINGOOPHORECTOMY     BREAST EXCISIONAL BIOPSY Bilateral 1995   benign   CHOLECYSTECTOMY     COLONOSCOPY  11/14/2011   Procedure: COLONOSCOPY;  Surgeon: Corbin Ade, MD;  Location: [facility];  Service: Endoscopy;  Laterality: N/A;   REPLACEMENT TOTAL KNEE BILATERAL     ROUX-EN-Y PROCEDURE  2007   TEE WITHOUT CARDIOVERSION N/A 11/11/2022   Procedure: TRANSESOPHAGEAL ECHOCARDIOGRAM (TEE);  Surgeon: Marjo Bicker, MD;  Location: [facility];  Service: Cardiovascular;  Laterality: N/A;   TONSILLECTOMY     TOTAL HIP ARTHROPLASTY Right 12/23/2019   Procedure: RIGHT TOTAL HIP ARTHROPLASTY ANTERIOR APPROACH;  Surgeon: Tarry Kos, MD;  Location: [facility] OR;  Service: Orthopedics;  Laterality: Right;   TOTAL HIP ARTHROPLASTY Left 11/21/2022   Procedure: LEFT TOTAL HIP ARTHROPLASTY ANTERIOR APPROACH;  Surgeon: Tarry Kos, MD;  Location: [facility];  Service: Orthopedics;  Laterality: Left;  3-C   TOTAL THYROIDECTOMY  12/2010   TUBAL LIGATION     Family History  Problem Relation Age of Onset   Breast cancer Mother    Pneumonia Mother 51   CAD Father 34   Breast cancer Sister    CAD Sister 35   Other Maternal Grandmother 65   CAD Brother 56   Other Brother 32        drug overdose   CAD Other 32   Colon cancer Neg Hx    Social History   Socioeconomic History   Marital status: Widowed    Spouse name: Not on file   Number of children: Not on file   Years of education: Not on file   Highest education level: Not on file  Occupational History   Occupation: Retired Clinical biochemist  Tobacco Use   Smoking status: Never   Smokeless tobacco: Never  Vaping Use   Vaping Use: Never used  Substance and Sexual Activity   Alcohol use: No   Drug use: No   Sexual activity: Not on file  Other Topics Concern   Not on file  Social History Narrative   Not on file   Social Determinants of Health   Financial Resource Strain: Low Risk  (02/15/2022)   Overall Financial Resource Strain (CARDIA)    Difficulty of Paying Living Expenses: Not very hard  Food Insecurity: No Food Insecurity (11/21/2022)   Hunger Vital Sign    Worried About Running Out of Food in the Last Year: Never true  Ran Out of Food in the Last Year: Never true  Transportation Needs: No Transportation Needs (11/21/2022)   PRAPARE - Administrator, Civil Service (Medical): No    Lack of Transportation (Non-Medical): No  Physical Activity: Insufficiently Active (02/15/2022)   Exercise Vital Sign    Days of Exercise per Week: 1 day    Minutes of Exercise per Session: 10 min  Stress: No Stress Concern Present (02/15/2022)   [employer] of Occupational Health - Occupational Stress Questionnaire    Feeling of Stress : Not at all  Social Connections: Moderately Integrated (02/15/2022)   Social Connection and Isolation Panel [NHANES]    Frequency of Communication with Friends and Family: More than three times a week    Frequency of Social Gatherings with Friends and Family: Three times a week    Attends Religious Services: More than 4 times per year    Active Member of Clubs or Organizations: Yes    Attends Banker Meetings: More than 4 times per year    Marital  Status: Widowed  Intimate Partner Violence: Not At Risk (11/21/2022)   Humiliation, Afraid, Rape, and Kick questionnaire    Fear of Current or Ex-Partner: No    Emotionally Abused: No    Physically Abused: No    Sexually Abused: No    Review of Systems Constitutional: Patient ***denies any unintentional weight loss or change in strength lntegumentary: Patient ***denies any rashes or pruritus Eyes: Patient denies ***dry eyes ENT: Patient ***denies dry mouth Cardiovascular: Patient ***denies chest pain or syncope Respiratory: Patient ***denies shortness of breath Gastrointestinal: Patient ***denies nausea, vomiting, constipation, or diarrhea Musculoskeletal: Patient ***denies muscle cramps or weakness Neurologic: Patient ***denies convulsions or seizures Psychiatric: Patient ***denies memory problems Allergic/Immunologic: Patient ***denies recent allergic reaction(s) Hematologic/Lymphatic: Patient denies bleeding tendencies Endocrine: Patient ***denies heat/cold intolerance  GU: As per HPI.  OBJECTIVE There were no vitals filed for this visit. There is no height or weight on file to calculate BMI.  Physical Examination  Constitutional: ***No obvious distress; patient is ***non-toxic appearing  Cardiovascular: ***No visible lower extremity edema.  Respiratory: The patient does ***not have audible wheezing/stridor; respirations do ***not appear labored  Gastrointestinal: Abdomen ***non-distended Musculoskeletal: ***Normal ROM of UEs  Skin: ***No obvious rashes/open sores  Neurologic: CN 2-12 grossly ***intact Psychiatric: Answered questions ***appropriately with ***normal affect  Hematologic/Lymphatic/Immunologic: ***No obvious bruises or sites of spontaneous bleeding  UA: {Desc; negative/positive:13464} *** WBC/hpf, *** RBC/hpf, bacteria (***) *** nitrites, *** leukocytes, *** blood PVR: *** ml  ASSESSMENT No diagnosis found. ***  Will plan for follow up in ***months /  ***1 year or sooner if needed. Pt verbalized understanding and agreement. All questions were answered.  PLAN Advised the following: 1. *** 2. ***No follow-ups on file.  No orders of the defined types were placed in this encounter.   It has been explained that the patient is to follow regularly with their PCP in addition to all other providers involved in their care and to follow instructions provided by these respective offices. Patient advised to contact urology clinic if any urologic-pertaining questions, concerns, new symptoms or problems arise in the interim period.  There are no Patient Instructions on file for this visit.  Electronically signed by:  Donnita Falls, FNP   06/09/23    2:57 PM

## 2023-06-12 ENCOUNTER — Encounter: Payer: Self-pay | Admitting: Urology

## 2023-06-12 ENCOUNTER — Ambulatory Visit: Payer: Medicare HMO | Admitting: Urology

## 2023-06-12 VITALS — BP 129/83 | HR 58 | Temp 98.2°F

## 2023-06-12 DIAGNOSIS — R350 Frequency of micturition: Secondary | ICD-10-CM | POA: Diagnosis not present

## 2023-06-12 DIAGNOSIS — R351 Nocturia: Secondary | ICD-10-CM | POA: Diagnosis not present

## 2023-06-12 DIAGNOSIS — N3941 Urge incontinence: Secondary | ICD-10-CM | POA: Diagnosis not present

## 2023-06-12 DIAGNOSIS — R3915 Urgency of urination: Secondary | ICD-10-CM | POA: Insufficient documentation

## 2023-06-12 LAB — URINALYSIS, ROUTINE W REFLEX MICROSCOPIC
Bilirubin, UA: NEGATIVE
Glucose, UA: NEGATIVE
Ketones, UA: NEGATIVE
Leukocytes,UA: NEGATIVE
Nitrite, UA: NEGATIVE
Protein,UA: NEGATIVE
RBC, UA: NEGATIVE
Specific Gravity, UA: 1.02 (ref 1.005–1.030)
Urobilinogen, Ur: 0.2 mg/dL (ref 0.2–1.0)
pH, UA: 5.5 (ref 5.0–7.5)

## 2023-06-12 NOTE — Progress Notes (Signed)
post void residual =4 

## 2023-06-13 ENCOUNTER — Other Ambulatory Visit: Payer: Self-pay

## 2023-06-13 DIAGNOSIS — N3941 Urge incontinence: Secondary | ICD-10-CM

## 2023-06-14 ENCOUNTER — Telehealth: Payer: Self-pay

## 2023-06-14 NOTE — Telephone Encounter (Signed)
-----   Message from Donnita Falls, FNP sent at 06/14/2023  9:31 AM EDT ----- Please let patient know that Dr. Ronne Binning advised her to discontinue her Leslye Peer prior to Botox procedure on 06/23/23.

## 2023-06-14 NOTE — Telephone Encounter (Signed)
Patient is made aware of Sarah recommendation and voiced understanding. 

## 2023-06-23 ENCOUNTER — Ambulatory Visit: Payer: Medicare HMO | Admitting: Urology

## 2023-06-23 VITALS — BP 114/71 | HR 66

## 2023-06-23 DIAGNOSIS — N3941 Urge incontinence: Secondary | ICD-10-CM | POA: Diagnosis not present

## 2023-06-23 LAB — URINALYSIS, ROUTINE W REFLEX MICROSCOPIC
Bilirubin, UA: NEGATIVE
Glucose, UA: NEGATIVE
Ketones, UA: NEGATIVE
Leukocytes,UA: NEGATIVE
Nitrite, UA: NEGATIVE
Protein,UA: NEGATIVE
RBC, UA: NEGATIVE
Specific Gravity, UA: 1.015 (ref 1.005–1.030)
Urobilinogen, Ur: 0.2 mg/dL (ref 0.2–1.0)
pH, UA: 5.5 (ref 5.0–7.5)

## 2023-06-23 MED ORDER — LIDOCAINE HCL 1 % IJ SOLN: 20.0000 mL | Freq: Once | INTRAMUSCULAR | Status: DC

## 2023-06-23 MED ORDER — ONABOTULINUMTOXINA 100 UNITS IJ SOLR
100.0000 [IU] | Freq: Once | INTRAMUSCULAR | Status: AC
  Administered 2023-06-23: 100 [IU] via INTRAMUSCULAR

## 2023-06-23 MED ORDER — LIDOCAINE HCL 1 % IJ SOLN
50.0000 mL | Freq: Once | INTRAMUSCULAR | Status: AC
  Administered 2023-06-23: 50 mL

## 2023-06-23 MED ORDER — CIPROFLOXACIN HCL 500 MG PO TABS
500.0000 mg | ORAL_TABLET | Freq: Once | ORAL | Status: AC
  Administered 2023-06-23: 500 mg via ORAL

## 2023-06-27 ENCOUNTER — Telehealth: Payer: Self-pay | Admitting: *Deleted

## 2023-06-27 ENCOUNTER — Ambulatory Visit (INDEPENDENT_AMBULATORY_CARE_PROVIDER_SITE_OTHER): Payer: Medicare HMO | Admitting: Family Medicine

## 2023-06-27 VITALS — BP 119/76 | HR 58 | Temp 98.1°F | Ht 67.0 in | Wt 168.8 lb

## 2023-06-27 DIAGNOSIS — K229 Disease of esophagus, unspecified: Secondary | ICD-10-CM | POA: Insufficient documentation

## 2023-06-27 DIAGNOSIS — E890 Postprocedural hypothyroidism: Secondary | ICD-10-CM

## 2023-06-27 DIAGNOSIS — I10 Essential (primary) hypertension: Secondary | ICD-10-CM

## 2023-06-27 DIAGNOSIS — E785 Hyperlipidemia, unspecified: Secondary | ICD-10-CM | POA: Diagnosis not present

## 2023-06-27 NOTE — Assessment & Plan Note (Signed)
Stable on enalapril.  Continue.

## 2023-06-27 NOTE — Assessment & Plan Note (Signed)
Stable on current dosing of Synthroid.

## 2023-06-27 NOTE — Progress Notes (Signed)
Subjective:  Patient ID: Hailey [name], female    DOB: 08/11/1946  Age: 77 y.o. MRN: [id number]  CC:  Follow up  HPI:  77 year old female with the below mentioned medical problems presents for follow-up.  Patient states that overall she is doing fairly well.  She is having ongoing issues with her bladder.  She is following closely with urology.  Patient still has not had a transesophageal echo.  This was previously attempted but was unsuccessful.    Previous transthoracic echocardiogram revealed a mobile echodensity 0.59 x 0.89 cm attached possibly to the anterior  mitral valve leaflet by a stalk and is noted on the atrial side during systole. Differential diagnoses include chordal rupture, flail mitral valve leaflet, tumor, thrombus, vegetation.   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  Needs GI consultation and probably esophageal dilation so that TEE can be done.  I am not sure why this has not been done.  Will arrange.  Hypertension is well-controlled on enalapril.  Fair control of lipids without pharmacotherapy.  Hypothyroidism has been [name] on 150 mcg of Synthroid.  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   Essential hypertension, benign 09/09/2013   Hyperlipidemia 09/09/2013    Social Hx   Social History   Socioeconomic History   Marital status: Widowed    Spouse name: Not on file   Number of children: Not on file   Years of education: Not on file   Highest  education level: Not on file  Occupational History   Occupation: Retired Clinical biochemist  Tobacco Use   Smoking status: Never   Smokeless tobacco: Never  Vaping Use   Vaping status: Never Used  Substance and Sexual Activity   Alcohol use: No   Drug use: No   Sexual activity: Not on file  Other Topics Concern   Not on file  Social History Narrative   Not on file   Social Determinants of Health   Financial Resource Strain: Low Risk  (02/15/2022)   Overall Financial Resource Strain (CARDIA)    Difficulty of Paying Living Expenses: Not very hard  Food Insecurity: No Food Insecurity (11/21/2022)   Hunger Vital Sign    Worried About Running Out of Food in the Last Year: Never true    Ran Out of Food in the Last Year: Never true  Transportation Needs: No Transportation Needs (11/21/2022)   PRAPARE - Administrator, Civil Service (Medical): No    Lack of Transportation (Non-Medical): No  Physical Activity: Insufficiently Active (02/15/2022)   Exercise Vital Sign    Days of Exercise per Week: 1 day    Minutes of Exercise per Session: 10 min  Stress: No Stress Concern Present (02/15/2022)   [employer] of Occupational Health - Occupational Stress Questionnaire    Feeling of Stress : Not at all  Social Connections: Moderately Integrated (02/15/2022)   Social Connection and Isolation Panel [NHANES]    Frequency of Communication with Friends and Family: More than three times a week  Frequency of Social Gatherings with Friends and Family: Three times a week    Attends Religious Services: More than 4 times per year    Active Member of Clubs or Organizations: Yes    Attends Banker Meetings: More than 4 times per year    Marital Status: Widowed    Review of Systems  Respiratory: Negative.    Cardiovascular: Negative.      Objective:  BP 119/76   Pulse (!) 58   Temp 98.1 F (36.7 C)   Ht 5\' 7"  (1.702 m)   Wt 168 lb 12.8 oz (76.6 kg)   SpO2 99%    BMI 26.44 kg/m      06/27/2023   10:22 AM 06/23/2023   10:38 AM 06/12/2023   10:43 AM  BP/Weight  Systolic BP 119 114 129  Diastolic BP 76 71 83  Wt. (Lbs) 168.8    BMI 26.44 kg/m2      Physical Exam Vitals and nursing note reviewed.  Constitutional:      Appearance: Normal appearance.  HENT:     Head: Normocephalic and atraumatic.  Cardiovascular:     Rate and Rhythm: Normal rate and regular rhythm.     Heart sounds: Murmur heard.  Pulmonary:     Effort: Pulmonary effort is normal.     Breath sounds: Normal breath sounds. No wheezing, rhonchi or rales.  Neurological:     Mental Status: She is alert.  Psychiatric:        Mood and Affect: Mood normal.        Behavior: Behavior normal.     Lab Results  Component Value Date   WBC 4.7 02/07/2023   HGB 12.3 02/07/2023   HCT 36.9 02/07/2023   PLT 212 02/07/2023   GLUCOSE 81 02/07/2023   CHOL 171 02/07/2023   TRIG 82 02/07/2023   HDL 66 02/07/2023   LDLCALC 90 02/07/2023   ALT 16 02/07/2023   AST 13 02/07/2023   NA 141 02/07/2023   K 4.1 02/07/2023   CL 106 02/07/2023   CREATININE 0.97 02/07/2023   BUN 21 02/07/2023   CO2 20 02/07/2023   TSH 2.860 02/07/2023   INR 1.0 12/19/2019     Assessment & Plan:   Problem List Items Addressed This Visit       Cardiovascular and Mediastinum   Essential hypertension, benign (Chronic)    [name] on enalapril.  Continue.        Digestive   Esophageal abnormality - Primary    Referral placed to GI.  Needs EGD and subsequent TEE.      Relevant Orders   Ambulatory referral to Gastroenterology     Endocrine   Hypothyroidism, postsurgical (Chronic)    [name] on current dosing of Synthroid.        Other   Hyperlipidemia    [name].  Will continue to monitor.        Follow-up:  Return in about 3 months (around 09/27/2023).  Everlene Other DO [facility]

## 2023-06-27 NOTE — Patient Instructions (Signed)
I have reached out to Dr. Tenny Craw and Marletta Lor.  If you have not heard anything in the next 2 weeks let me know.  Follow up in 3 months.

## 2023-06-27 NOTE — Assessment & Plan Note (Addendum)
Stable. Will continue to monitor.

## 2023-06-27 NOTE — Assessment & Plan Note (Signed)
Referral placed to GI.  Needs EGD and subsequent TEE.

## 2023-06-27 NOTE — Telephone Encounter (Signed)
Per Dr. Marletta Lor "Patients cardiologist reached out to me in regards to this patient. Needs urgent OV to discuss EGD with dilation. Okay to put with me of an APP thank you"   Called pt and she has been scheduled with Dr. Marletta Lor tomorrow 7/17 at 1:30pm. Aware it will be at [address] location.

## 2023-06-28 ENCOUNTER — Encounter: Payer: Self-pay | Admitting: *Deleted

## 2023-06-28 ENCOUNTER — Ambulatory Visit (INDEPENDENT_AMBULATORY_CARE_PROVIDER_SITE_OTHER): Payer: Medicare HMO | Admitting: Internal Medicine

## 2023-06-28 ENCOUNTER — Other Ambulatory Visit: Payer: Self-pay | Admitting: *Deleted

## 2023-06-28 ENCOUNTER — Encounter: Payer: Self-pay | Admitting: Internal Medicine

## 2023-06-28 VITALS — BP 89/65 | HR 71 | Temp 98.0°F | Ht 67.0 in | Wt 167.8 lb

## 2023-06-28 DIAGNOSIS — R1319 Other dysphagia: Secondary | ICD-10-CM

## 2023-06-28 DIAGNOSIS — Z1211 Encounter for screening for malignant neoplasm of colon: Secondary | ICD-10-CM

## 2023-06-28 DIAGNOSIS — K219 Gastro-esophageal reflux disease without esophagitis: Secondary | ICD-10-CM

## 2023-06-28 DIAGNOSIS — I340 Nonrheumatic mitral (valve) insufficiency: Secondary | ICD-10-CM | POA: Diagnosis not present

## 2023-06-28 MED ORDER — PEG 3350-KCL-NA BICARB-NACL 420 G PO SOLR: 4000.0000 mL | Freq: Once | ORAL | 0 refills | Status: AC

## 2023-06-28 NOTE — Patient Instructions (Signed)
We will schedule you for upper endoscopy today.  I may elect to stretch your esophagus depending on findings.  At the same time we will perform colonoscopy for colon cancer screening purposes.  Continue on Pepcid for your chronic reflux.  It was very nice meeting you today.  Dr. Marletta Lor

## 2023-06-28 NOTE — Progress Notes (Signed)
Primary Care Physician:  Tommie Sams, DO Primary Gastroenterologist:  Dr. Marletta Lor  Chief Complaint  Patient presents with   New Patient (Initial Visit)    Referred for Per cardiology, main concern is for a torn chord.  Needs GI consultation and probably esophageal dilation so that TEE can be done.     HPI:   [name] is a 77 y.o. female who presents to clinic today by referral from her PCP Dr. Adriana Simas for evaluation.  She has a history of mitral regurgitation. Echo done showed LVEF 60 to 65%  Mod MR that was eccentric   Mobile echodensity seen on atrial side, possibly torn chord   TEE recommended  This was attempted in Dec 2023 but was  unsuccessful.  Unable to advance TEE even with video glidescope    Does note history of chronic acid reflux which is well-controlled on Pepcid 40 mg daily.  Occasional esophageal dysphagia, mild, intermittent.  History of previous gastric bypass.  Colonoscopy 11/14/2011 unremarkable besides diverticulosis.  No family history of colorectal malignancy.  No melena hematochezia.  No abdominal pain.  No unintentional weight loss.  Past Medical History:  Diagnosis Date   Arthritis    Cancer [facility])    Thyroid cancer   COVID-19 virus infection 12/2020   Family history of adverse reaction to anesthesia    mother "couldn't wake up good" and would throw up   GERD (gastroesophageal reflux disease)    Heart murmur    History of kidney stones    Hypercholesteremia    resolved after gastric bypass   Hypertension    Hypothyroidism     Past Surgical History:  Procedure Laterality Date   ABDOMINAL HYSTERECTOMY     BILATERAL SALPINGOOPHORECTOMY     BREAST EXCISIONAL BIOPSY Bilateral 1995   benign   CHOLECYSTECTOMY     COLONOSCOPY  11/14/2011   Procedure: COLONOSCOPY;  Surgeon: Corbin Ade, MD;  Location: [facility];  Service: Endoscopy;  Laterality: N/A;   REPLACEMENT TOTAL KNEE BILATERAL     ROUX-EN-Y PROCEDURE  2007   TEE WITHOUT  CARDIOVERSION N/A 11/11/2022   Procedure: TRANSESOPHAGEAL ECHOCARDIOGRAM (TEE);  Surgeon: Marjo Bicker, MD;  Location: [facility];  Service: Cardiovascular;  Laterality: N/A;   TONSILLECTOMY     TOTAL HIP ARTHROPLASTY Right 12/23/2019   Procedure: RIGHT TOTAL HIP ARTHROPLASTY ANTERIOR APPROACH;  Surgeon: Tarry Kos, MD;  Location: [facility];  Service: Orthopedics;  Laterality: Right;   TOTAL HIP ARTHROPLASTY Left 11/21/2022   Procedure: LEFT TOTAL HIP ARTHROPLASTY ANTERIOR APPROACH;  Surgeon: Tarry Kos, MD;  Location: [facility];  Service: Orthopedics;  Laterality: Left;  3-C   TOTAL THYROIDECTOMY  12/2010   TUBAL LIGATION      Current Outpatient Medications  Medication Sig Dispense Refill   amoxicillin (AMOXIL) 500 MG capsule Take 2,000 mg by mouth See admin instructions. Take 4 capsules (2000 mg) by mouth 1 hour prior to dental appointments     Calcium Citrate-Vitamin D (CALCIUM CITRATE+D3 PO) Take 630 mg by mouth 2 (two) times daily.     carboxymethylcellulose (REFRESH PLUS) 0.5 % SOLN Place 1 drop into both eyes in the morning and at bedtime.     enalapril (VASOTEC) 10 MG tablet TAKE 1 TABLET AT BEDTIME 90 tablet 1   famotidine (PEPCID) 40 MG tablet TAKE 1 TABLET DAILY (CANCELRANTITIDINE) 90 tablet 1   ferrous sulfate 325 (65 FE) MG tablet Take 325 mg by mouth daily in the  afternoon.     levothyroxine (SYNTHROID) 150 MCG tablet Take 1 tablet (150 mcg total) by mouth daily before breakfast. 30 tablet 0   MAGNESIUM PO Take 1 tablet by mouth daily as needed (leg/feet cramps).     Multiple Vitamin (MULTIVITAMIN WITH MINERALS) TABS tablet Take 1 tablet by mouth in the morning and at bedtime.     Multiple Vitamins-Minerals (ZINC PO) Take 1 tablet by mouth daily as needed (leg/feet cramps).     valACYclovir (VALTREX) 1000 MG tablet TAKE 2 TABLETS AT FIRST SIGN OF FEVER BLISTER, THEN 2 TABLETS 2 HOURSLATER (Patient taking differently: Take 2,000 mg by mouth 2 (two) times daily as needed (fever  blisters.).) 4 tablet 5   vitamin B-12 (CYANOCOBALAMIN) 500 MCG tablet Take 500 mcg by mouth daily in the afternoon.     VITAMIN D PO Take 2,000 Units by mouth in the morning and at bedtime.     No current facility-administered medications for this visit.    Allergies as of 06/28/2023 - Review Complete 06/28/2023  Allergen Reaction Noted   Codeine Nausea And Vomiting 10/25/2011   Demerol Nausea And Vomiting 10/25/2011   Nsaids Other (See Comments) 10/21/2022   Quinine Other (See Comments) 03/05/2017    Family History  Problem Relation Age of Onset   Breast cancer Mother    Pneumonia Mother 51   CAD Father 100   Breast cancer Sister    CAD Sister 34   Other Maternal Grandmother 40   CAD Brother 49   Other Brother 75       drug overdose   CAD Other 32   Colon cancer Neg Hx     Social History   Socioeconomic History   Marital status: Widowed    Spouse name: Not on file   Number of children: Not on file   Years of education: Not on file   Highest education level: Not on file  Occupational History   Occupation: Retired Clinical biochemist  Tobacco Use   Smoking status: Never   Smokeless tobacco: Never  Vaping Use   Vaping status: Never Used  Substance and Sexual Activity   Alcohol use: No   Drug use: No   Sexual activity: Not on file  Other Topics Concern   Not on file  Social History Narrative   Not on file   Social Determinants of Health   Financial Resource Strain: Low Risk  (02/15/2022)   Overall Financial Resource Strain (CARDIA)    Difficulty of Paying Living Expenses: Not very hard  Food Insecurity: No Food Insecurity (11/21/2022)   Hunger Vital Sign    Worried About Running Out of Food in the Last Year: Never true    Ran Out of Food in the Last Year: Never true  Transportation Needs: No Transportation Needs (11/21/2022)   PRAPARE - Administrator, Civil Service (Medical): No    Lack of Transportation (Non-Medical): No  Physical Activity:  Insufficiently Active (02/15/2022)   Exercise Vital Sign    Days of Exercise per Week: 1 day    Minutes of Exercise per Session: 10 min  Stress: No Stress Concern Present (02/15/2022)   [employer] of Occupational Health - Occupational Stress Questionnaire    Feeling of Stress : Not at all  Social Connections: Moderately Integrated (02/15/2022)   Social Connection and Isolation Panel [NHANES]    Frequency of Communication with Friends and Family: More than three times a week    Frequency of Social Gatherings with  Friends and Family: Three times a week    Attends Religious Services: More than 4 times per year    Active Member of Clubs or Organizations: Yes    Attends Banker Meetings: More than 4 times per year    Marital Status: Widowed  Intimate Partner Violence: Not At Risk (11/21/2022)   Humiliation, Afraid, Rape, and Kick questionnaire    Fear of Current or Ex-Partner: No    Emotionally Abused: No    Physically Abused: No    Sexually Abused: No    Subjective: Review of Systems  Constitutional:  Negative for chills and fever.  HENT:  Negative for congestion and hearing loss.   Eyes:  Negative for blurred vision and double vision.  Respiratory:  Negative for cough and shortness of breath.   Cardiovascular:  Negative for chest pain and palpitations.  Gastrointestinal:  Negative for abdominal pain, blood in stool, constipation, diarrhea, heartburn, melena and vomiting.  Genitourinary:  Negative for dysuria and urgency.  Musculoskeletal:  Negative for joint pain and myalgias.  Skin:  Negative for itching and rash.  Neurological:  Negative for dizziness and headaches.  Psychiatric/Behavioral:  Negative for depression. The patient is not nervous/anxious.        Objective: There were no vitals taken for this visit. Physical Exam Constitutional:      Appearance: Normal appearance.  HENT:     Head: Normocephalic and atraumatic.  Eyes:     Extraocular Movements:  Extraocular movements intact.     Conjunctiva/sclera: Conjunctivae normal.  Cardiovascular:     Rate and Rhythm: Normal rate and regular rhythm.  Pulmonary:     Effort: Pulmonary effort is normal.     Breath sounds: Normal breath sounds.  Abdominal:     General: Bowel sounds are normal.     Palpations: Abdomen is soft.  Musculoskeletal:        General: No swelling. Normal range of motion.     Cervical back: Normal range of motion and neck supple.  Skin:    General: Skin is warm and dry.     Coloration: Skin is not jaundiced.  Neurological:     General: No focal deficit present.     Mental Status: She is alert and oriented to person, place, and time.  Psychiatric:        Mood and Affect: Mood normal.        Behavior: Behavior normal.      Assessment: *Esophageal dysphagia *Chronic GERD-well-controlled on Pepcid daily *Colon cancer screening *Mitral regurgitation  Plan: Will schedule for EGD with possible dilation to evaluate for peptic ulcer disease, esophagitis, gastritis, H. Pylori, duodenitis, or other. Will also evaluate for esophageal stricture, Schatzki's ring, esophageal web or other.   Discussed colon cancer screening with her today.  She would like to proceed with colonoscopy for colon cancer screening which we will perform at the same time as upper endoscopy.  The risks including infection, bleed, or perforation as well as benefits, limitations, alternatives and imponderables have been reviewed with the patient. Potential for esophageal dilation, biopsy, etc. have also been reviewed.  Questions have been answered. All parties agreeable.  Continue on Pepcid daily.  Thank you Dr. Adriana Simas for the kind referral. 06/28/2023 1:28 PM   Disclaimer: This note was dictated with voice recognition software. Similar sounding words can inadvertently be transcribed and may not be corrected upon review.

## 2023-06-28 NOTE — H&P (View-Only) (Signed)
Primary Care Physician:  Tommie Sams, DO Primary Gastroenterologist:  Dr. Marletta Lor  Chief Complaint  Patient presents with   New Patient (Initial Visit)    Referred for Per cardiology, main concern is for a torn chord.  Needs GI consultation and probably esophageal dilation so that TEE can be done.     HPI:   [name] is a 77 y.o. female who presents to clinic today by referral from her PCP Dr. Adriana Simas for evaluation.  She has a history of mitral regurgitation. Echo done showed LVEF 60 to 65%  Mod MR that was eccentric   Mobile echodensity seen on atrial side, possibly torn chord   TEE recommended  This was attempted in Dec 2023 but was  unsuccessful.  Unable to advance TEE even with video glidescope    Does note history of chronic acid reflux which is well-controlled on Pepcid 40 mg daily.  Occasional esophageal dysphagia, mild, intermittent.  History of previous gastric bypass.  Colonoscopy 11/14/2011 unremarkable besides diverticulosis.  No family history of colorectal malignancy.  No melena hematochezia.  No abdominal pain.  No unintentional weight loss.  Past Medical History:  Diagnosis Date   Arthritis    Cancer [facility])    Thyroid cancer   COVID-19 virus infection 12/2020   Family history of adverse reaction to anesthesia    mother "couldn't wake up good" and would throw up   GERD (gastroesophageal reflux disease)    Heart murmur    History of kidney stones    Hypercholesteremia    resolved after gastric bypass   Hypertension    Hypothyroidism     Past Surgical History:  Procedure Laterality Date   ABDOMINAL HYSTERECTOMY     BILATERAL SALPINGOOPHORECTOMY     BREAST EXCISIONAL BIOPSY Bilateral 1995   benign   CHOLECYSTECTOMY     COLONOSCOPY  11/14/2011   Procedure: COLONOSCOPY;  Surgeon: Corbin Ade, MD;  [facility];  Service: Endoscopy;  Laterality: N/A;   REPLACEMENT TOTAL KNEE BILATERAL     ROUX-EN-Y PROCEDURE  2007   TEE WITHOUT  CARDIOVERSION N/A 11/11/2022   Procedure: TRANSESOPHAGEAL ECHOCARDIOGRAM (TEE);  Surgeon: Marjo Bicker, MD;  Location: [facility];  Service: Cardiovascular;  Laterality: N/A;   TONSILLECTOMY     TOTAL HIP ARTHROPLASTY Right 12/23/2019   Procedure: RIGHT TOTAL HIP ARTHROPLASTY ANTERIOR APPROACH;  Surgeon: Tarry Kos, MD;  Location: [facility];  Service: Orthopedics;  Laterality: Right;   TOTAL HIP ARTHROPLASTY Left 11/21/2022   Procedure: LEFT TOTAL HIP ARTHROPLASTY ANTERIOR APPROACH;  Surgeon: Tarry Kos, MD;  Location: [facility];  Service: Orthopedics;  Laterality: Left;  3-C   TOTAL THYROIDECTOMY  12/2010   TUBAL LIGATION      Current Outpatient Medications  Medication Sig Dispense Refill   amoxicillin (AMOXIL) 500 MG capsule Take 2,000 mg by mouth See admin instructions. Take 4 capsules (2000 mg) by mouth 1 hour prior to dental appointments     Calcium Citrate-Vitamin D (CALCIUM CITRATE+D3 PO) Take 630 mg by mouth 2 (two) times daily.     carboxymethylcellulose (REFRESH PLUS) 0.5 % SOLN Place 1 drop into both eyes in the morning and at bedtime.     enalapril (VASOTEC) 10 MG tablet TAKE 1 TABLET AT BEDTIME 90 tablet 1   famotidine (PEPCID) 40 MG tablet TAKE 1 TABLET DAILY (CANCELRANTITIDINE) 90 tablet 1   ferrous sulfate 325 (65 FE) MG tablet Take 325 mg by mouth daily in the  afternoon.     levothyroxine (SYNTHROID) 150 MCG tablet Take 1 tablet (150 mcg total) by mouth daily before breakfast. 30 tablet 0   MAGNESIUM PO Take 1 tablet by mouth daily as needed (leg/feet cramps).     Multiple Vitamin (MULTIVITAMIN WITH MINERALS) TABS tablet Take 1 tablet by mouth in the morning and at bedtime.     Multiple Vitamins-Minerals (ZINC PO) Take 1 tablet by mouth daily as needed (leg/feet cramps).     valACYclovir (VALTREX) 1000 MG tablet TAKE 2 TABLETS AT FIRST SIGN OF FEVER BLISTER, THEN 2 TABLETS 2 HOURSLATER (Patient taking differently: Take 2,000 mg by mouth 2 (two) times daily as needed (fever  blisters.).) 4 tablet 5   vitamin B-12 (CYANOCOBALAMIN) 500 MCG tablet Take 500 mcg by mouth daily in the afternoon.     VITAMIN D PO Take 2,000 Units by mouth in the morning and at bedtime.     No current facility-administered medications for this visit.    Allergies as of 06/28/2023 - Review Complete 06/28/2023  Allergen Reaction Noted   Codeine Nausea And Vomiting 10/25/2011   Demerol Nausea And Vomiting 10/25/2011   Nsaids Other (See Comments) 10/21/2022   Quinine Other (See Comments) 03/05/2017    Family History  Problem Relation Age of Onset   Breast cancer Mother    Pneumonia Mother 51   CAD Father 100   Breast cancer Sister    CAD Sister 34   Other Maternal Grandmother 40   CAD Brother 49   Other Brother 75       drug overdose   CAD Other 32   Colon cancer Neg Hx     Social History   Socioeconomic History   Marital status: Widowed    Spouse name: Not on file   Number of children: Not on file   Years of education: Not on file   Highest education level: Not on file  Occupational History   Occupation: Retired Clinical biochemist  Tobacco Use   Smoking status: Never   Smokeless tobacco: Never  Vaping Use   Vaping status: Never Used  Substance and Sexual Activity   Alcohol use: No   Drug use: No   Sexual activity: Not on file  Other Topics Concern   Not on file  Social History Narrative   Not on file   Social Determinants of Health   Financial Resource Strain: Low Risk  (02/15/2022)   Overall Financial Resource Strain (CARDIA)    Difficulty of Paying Living Expenses: Not very hard  Food Insecurity: No Food Insecurity (11/21/2022)   Hunger Vital Sign    Worried About Running Out of Food in the Last Year: Never true    Ran Out of Food in the Last Year: Never true  Transportation Needs: No Transportation Needs (11/21/2022)   PRAPARE - Administrator, Civil Service (Medical): No    Lack of Transportation (Non-Medical): No  Physical Activity:  Insufficiently Active (02/15/2022)   Exercise Vital Sign    Days of Exercise per Week: 1 day    Minutes of Exercise per Session: 10 min  Stress: No Stress Concern Present (02/15/2022)   [employer] of Occupational Health - Occupational Stress Questionnaire    Feeling of Stress : Not at all  Social Connections: Moderately Integrated (02/15/2022)   Social Connection and Isolation Panel [NHANES]    Frequency of Communication with Friends and Family: More than three times a week    Frequency of Social Gatherings with  Friends and Family: Three times a week    Attends Religious Services: More than 4 times per year    Active Member of Clubs or Organizations: Yes    Attends Banker Meetings: More than 4 times per year    Marital Status: Widowed  Intimate Partner Violence: Not At Risk (11/21/2022)   Humiliation, Afraid, Rape, and Kick questionnaire    Fear of Current or Ex-Partner: No    Emotionally Abused: No    Physically Abused: No    Sexually Abused: No    Subjective: Review of Systems  Constitutional:  Negative for chills and fever.  HENT:  Negative for congestion and hearing loss.   Eyes:  Negative for blurred vision and double vision.  Respiratory:  Negative for cough and shortness of breath.   Cardiovascular:  Negative for chest pain and palpitations.  Gastrointestinal:  Negative for abdominal pain, blood in stool, constipation, diarrhea, heartburn, melena and vomiting.  Genitourinary:  Negative for dysuria and urgency.  Musculoskeletal:  Negative for joint pain and myalgias.  Skin:  Negative for itching and rash.  Neurological:  Negative for dizziness and headaches.  Psychiatric/Behavioral:  Negative for depression. The patient is not nervous/anxious.        Objective: There were no vitals taken for this visit. Physical Exam Constitutional:      Appearance: Normal appearance.  HENT:     Head: Normocephalic and atraumatic.  Eyes:     Extraocular Movements:  Extraocular movements intact.     Conjunctiva/sclera: Conjunctivae normal.  Cardiovascular:     Rate and Rhythm: Normal rate and regular rhythm.  Pulmonary:     Effort: Pulmonary effort is normal.     Breath sounds: Normal breath sounds.  Abdominal:     General: Bowel sounds are normal.     Palpations: Abdomen is soft.  Musculoskeletal:        General: No swelling. Normal range of motion.     Cervical back: Normal range of motion and neck supple.  Skin:    General: Skin is warm and dry.     Coloration: Skin is not jaundiced.  Neurological:     General: No focal deficit present.     Mental Status: She is alert and oriented to person, place, and time.  Psychiatric:        Mood and Affect: Mood normal.        Behavior: Behavior normal.      Assessment: *Esophageal dysphagia *Chronic GERD-well-controlled on Pepcid daily *Colon cancer screening *Mitral regurgitation  Plan: Will schedule for EGD with possible dilation to evaluate for peptic ulcer disease, esophagitis, gastritis, H. Pylori, duodenitis, or other. Will also evaluate for esophageal stricture, Schatzki's ring, esophageal web or other.   Discussed colon cancer screening with her today.  She would like to proceed with colonoscopy for colon cancer screening which we will perform at the same time as upper endoscopy.  The risks including infection, bleed, or perforation as well as benefits, limitations, alternatives and imponderables have been reviewed with the patient. Potential for esophageal dilation, biopsy, etc. have also been reviewed.  Questions have been answered. All parties agreeable.  Continue on Pepcid daily.  Thank you Dr. Adriana Simas for the kind referral. 06/28/2023 1:28 PM   Disclaimer: This note was dictated with voice recognition software. Similar sounding words can inadvertently be transcribed and may not be corrected upon review.

## 2023-06-29 ENCOUNTER — Encounter: Payer: Self-pay | Admitting: *Deleted

## 2023-06-29 NOTE — Progress Notes (Unsigned)
Name: [name] DOB: Jan 12, 1946 MRN: [id number]  History of Present Illness: [name] is a 77 y.o. female who presents today for follow up visit at [facility] [place]. - GU history: 1. OAB with urinary frequency, urgency, and urge incontinence. - Failed Myrbetriq, Gemtesa, Oxybutynin.  At last visit with Dr. Ronne Binning on 06/23/2023: Had cystoscopy with bladder Botox (100 units).  Today: She reports significant symptomatic improvement since the procedure. She is now able to make it to the bathroom with better control; reports minimal leakage compared to prior. She reports decreased urinary frequency and urgency.    Fall Screening: Do you usually have a device to assist in your mobility? No   Medications: Current Outpatient Medications  Medication Sig Dispense Refill   amoxicillin (AMOXIL) 500 MG capsule Take 2,000 mg by mouth See admin instructions. Take 4 capsules (2000 mg) by mouth 1 hour prior to dental appointments     Calcium Citrate-Vitamin D (CALCIUM CITRATE+D3 PO) Take 630 mg by mouth 2 (two) times daily.     carboxymethylcellulose (REFRESH PLUS) 0.5 % SOLN Place 1 drop into both eyes in the morning and at bedtime.     enalapril (VASOTEC) 10 MG tablet TAKE 1 TABLET AT BEDTIME 90 tablet 1   famotidine (PEPCID) 40 MG tablet TAKE 1 TABLET DAILY (CANCELRANTITIDINE) 90 tablet 1   ferrous sulfate 325 (65 FE) MG tablet Take 325 mg by mouth daily in the afternoon.     levothyroxine (SYNTHROID) 150 MCG tablet Take 1 tablet (150 mcg total) by mouth daily before breakfast. 30 tablet 0   MAGNESIUM PO Take 1 tablet by mouth daily as needed (leg/feet cramps).     Multiple Vitamin (MULTIVITAMIN WITH MINERALS) TABS tablet Take 1 tablet by mouth in the morning and at bedtime.     Multiple Vitamins-Minerals (ZINC PO) Take 1 tablet by mouth daily as needed (leg/feet cramps).     valACYclovir (VALTREX) 1000 MG tablet TAKE 2 TABLETS AT FIRST SIGN OF FEVER BLISTER, THEN 2  TABLETS 2 HOURSLATER (Patient taking differently: Take 2,000 mg by mouth 2 (two) times daily as needed (fever blisters.).) 4 tablet 5   vitamin B-12 (CYANOCOBALAMIN) 500 MCG tablet Take 500 mcg by mouth daily in the afternoon.     VITAMIN D PO Take 2,000 Units by mouth in the morning and at bedtime.     No current facility-administered medications for this visit.    Allergies: Allergies  Allergen Reactions   Codeine Nausea And Vomiting   Demerol Nausea And Vomiting   Nsaids Other (See Comments)    Due to gastric bypass   Quinine Other (See Comments)    Unknown reaction type (patient is unable to recall reaction type)    Past Medical History:  Diagnosis Date   Arthritis    Cancer (HCC)    Thyroid cancer   COVID-19 virus infection 12/2020   Family history of adverse reaction to anesthesia    mother "couldn't wake up good" and would throw up   GERD (gastroesophageal reflux disease)    Heart murmur    History of kidney stones    Hypercholesteremia    resolved after gastric bypass   Hypertension    Hypothyroidism    Past Surgical History:  Procedure Laterality Date   ABDOMINAL HYSTERECTOMY     BILATERAL SALPINGOOPHORECTOMY     BREAST EXCISIONAL BIOPSY Bilateral 1995   benign   CHOLECYSTECTOMY     COLONOSCOPY  11/14/2011   Procedure: COLONOSCOPY;  Surgeon:  Corbin Ade, MD;  Location: [facility];  Service: Endoscopy;  Laterality: N/A;   REPLACEMENT TOTAL KNEE BILATERAL     ROUX-EN-Y PROCEDURE  2007   TEE WITHOUT CARDIOVERSION N/A 11/11/2022   Procedure: TRANSESOPHAGEAL ECHOCARDIOGRAM (TEE);  Surgeon: Marjo Bicker, MD;  Location: [facility];  Service: Cardiovascular;  Laterality: N/A;   TONSILLECTOMY     TOTAL HIP ARTHROPLASTY Right 12/23/2019   Procedure: RIGHT TOTAL HIP ARTHROPLASTY ANTERIOR APPROACH;  Surgeon: Tarry Kos, MD;  Location: [facility] OR;  Service: Orthopedics;  Laterality: Right;   TOTAL HIP ARTHROPLASTY Left 11/21/2022   Procedure: LEFT TOTAL HIP  ARTHROPLASTY ANTERIOR APPROACH;  Surgeon: Tarry Kos, MD;  Location: [facility] OR;  Service: Orthopedics;  Laterality: Left;  3-C   TOTAL THYROIDECTOMY  12/2010   TUBAL LIGATION     Family History  Problem Relation Age of Onset   Breast cancer Mother    Pneumonia Mother 16   CAD Father 70   Breast cancer Sister    CAD Sister 6   Other Maternal Grandmother 69   CAD Brother 72   Other Brother 60       drug overdose   CAD Other 32   Colon cancer Neg Hx    Social History   Socioeconomic History   Marital status: Widowed    Spouse name: Not on file   Number of children: Not on file   Years of education: Not on file   Highest education level: Not on file  Occupational History   Occupation: Retired Clinical biochemist  Tobacco Use   Smoking status: Never   Smokeless tobacco: Never  Vaping Use   Vaping status: Never Used  Substance and Sexual Activity   Alcohol use: No   Drug use: No   Sexual activity: Not on file  Other Topics Concern   Not on file  Social History Narrative   Not on file   Social Determinants of Health   Financial Resource Strain: Low Risk  (02/15/2022)   Overall Financial Resource Strain (CARDIA)    Difficulty of Paying Living Expenses: Not very hard  Food Insecurity: No Food Insecurity (11/21/2022)   Hunger Vital Sign    Worried About Running Out of Food in the Last Year: Never true    Ran Out of Food in the Last Year: Never true  Transportation Needs: No Transportation Needs (11/21/2022)   PRAPARE - Administrator, Civil Service (Medical): No    Lack of Transportation (Non-Medical): No  Physical Activity: Insufficiently Active (02/15/2022)   Exercise Vital Sign    Days of Exercise per Week: 1 day    Minutes of Exercise per Session: 10 min  Stress: No Stress Concern Present (02/15/2022)   [employer] of Occupational Health - Occupational Stress Questionnaire    Feeling of Stress : Not at all  Social Connections: Moderately Integrated  (02/15/2022)   Social Connection and Isolation Panel [NHANES]    Frequency of Communication with Friends and Family: More than three times a week    Frequency of Social Gatherings with Friends and Family: Three times a week    Attends Religious Services: More than 4 times per year    Active Member of Clubs or Organizations: Yes    Attends Banker Meetings: More than 4 times per year    Marital Status: Widowed  Intimate Partner Violence: Not At Risk (11/21/2022)   Humiliation, Afraid, Rape, and Kick questionnaire    Fear of  Current or Ex-Partner: No    Emotionally Abused: No    Physically Abused: No    Sexually Abused: No    Review of Systems Constitutional: Patient denies any unintentional weight loss or change in strength lntegumentary: Patient denies any rashes or pruritus Eyes: Patient denies dry eyes ENT: Patient denies dry mouth Cardiovascular: Patient denies chest pain or syncope Respiratory: Patient denies shortness of breath Gastrointestinal: Patient denies nausea, vomiting, constipation, or diarrhea Musculoskeletal: Patient denies muscle cramps or weakness Neurologic: Patient denies convulsions or seizures Psychiatric: Patient denies memory problems Allergic/Immunologic: Patient denies recent allergic reaction(s) Hematologic/Lymphatic: Patient denies bleeding tendencies Endocrine: Patient denies heat/cold intolerance  GU: As per HPI.  OBJECTIVE Vitals:   07/03/23 0902  BP: (!) 144/84  Pulse: 65  Temp: 98.2 F (36.8 C)   There is no height or weight on file to calculate BMI.  Physical Examination  Constitutional: No obvious distress; patient is non-toxic appearing  Cardiovascular: No visible lower extremity edema.  Respiratory: The patient does not have audible wheezing/stridor; respirations do not appear labored  Gastrointestinal: Abdomen non-distended Musculoskeletal: Normal ROM of UEs  Skin: No obvious rashes/open sores  Neurologic: CN 2-12  grossly intact Psychiatric: Answered questions appropriately with normal affect  Hematologic/Lymphatic/Immunologic: No obvious bruises or sites of spontaneous bleeding  UA: negative PVR: 0 ml  ASSESSMENT Urge incontinence - Plan: Urinalysis, Routine w reflex microscopic, BLADDER SCAN AMB NON-IMAGING  S/P Botox injection - Plan: Urinalysis, Routine w reflex microscopic, BLADDER SCAN AMB NON-IMAGING  Doing well after bladder Botox with no evidence of urinary retention or UTI.   Will plan for follow up in 6 months or sooner if needed. Pt verbalized understanding and agreement. All questions were answered.  PLAN Advised the following: 1. Return in about 6 months (around 01/03/2024) for UA, PVR, & f/u with Evette Georges NP.  Orders Placed This Encounter  Procedures   Urinalysis, Routine w reflex microscopic   BLADDER SCAN AMB NON-IMAGING    It has been explained that the patient is to follow regularly with their PCP in addition to all other providers involved in their care and to follow instructions provided by these respective offices. Patient advised to contact urology clinic if any urologic-pertaining questions, concerns, new symptoms or problems arise in the interim period.  There are no Patient Instructions on file for this visit.  Electronically signed by:  Donnita Falls, FNP   07/03/23    9:29 AM

## 2023-06-30 DIAGNOSIS — Z85850 Personal history of malignant neoplasm of thyroid: Secondary | ICD-10-CM | POA: Diagnosis not present

## 2023-06-30 DIAGNOSIS — C73 Malignant neoplasm of thyroid gland: Secondary | ICD-10-CM | POA: Diagnosis not present

## 2023-06-30 DIAGNOSIS — E890 Postprocedural hypothyroidism: Secondary | ICD-10-CM | POA: Diagnosis not present

## 2023-06-30 IMAGING — MG MM DIGITAL SCREENING BILAT W/ TOMO AND CAD
8 series · 9 of 24 positions shown · non-contrast
Comparison: Previous exam(s).

CLINICAL DATA: Screening.

EXAM:
DIGITAL SCREENING BILATERAL MAMMOGRAM WITH TOMOSYNTHESIS AND CAD
TECHNIQUE: Bilateral screening digital craniocaudal and mediolateral oblique
mammograms were obtained. Bilateral screening digital breast
tomosynthesis was performed. The images were evaluated with
computer-aided detection.

[R MLO synth-2D]
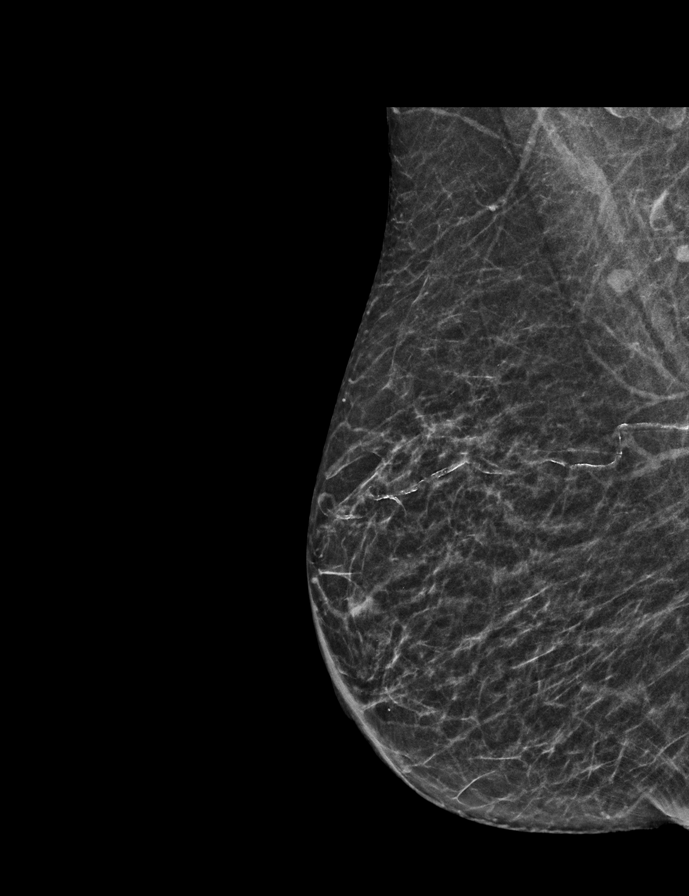

[L CC synth-2D]
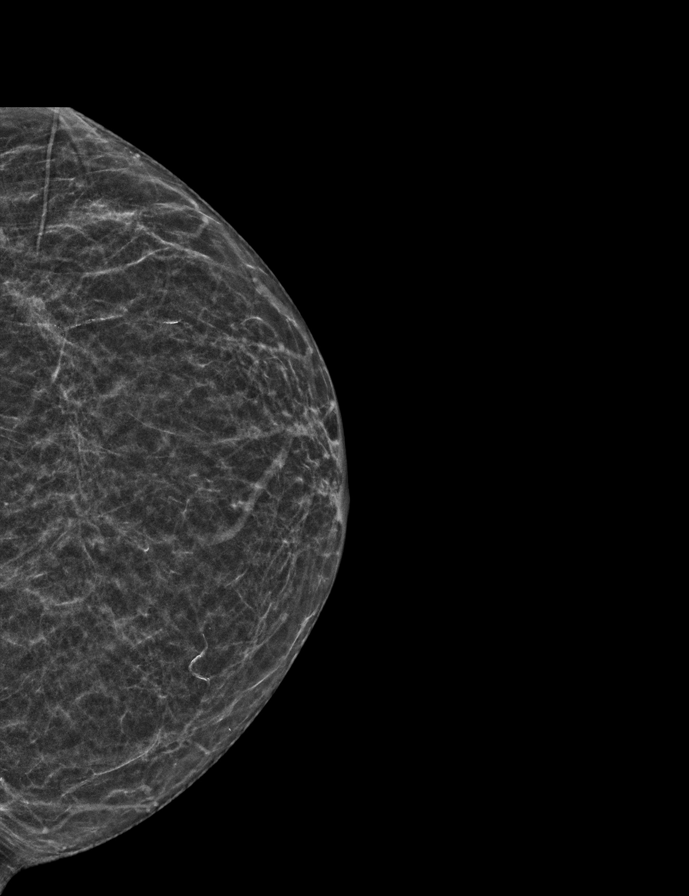

[L MLO synth-2D]
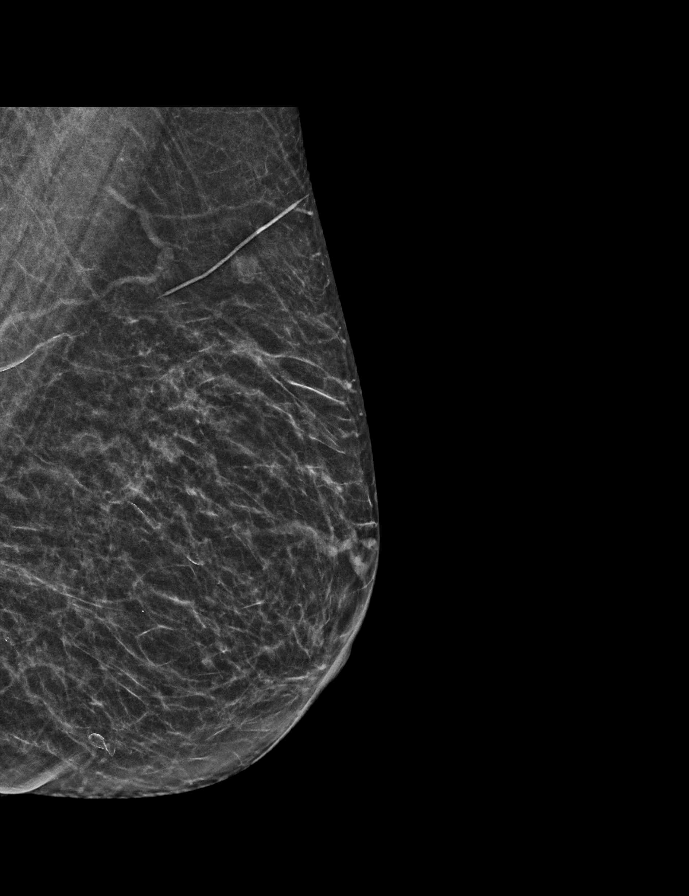

[R CC synth-2D]
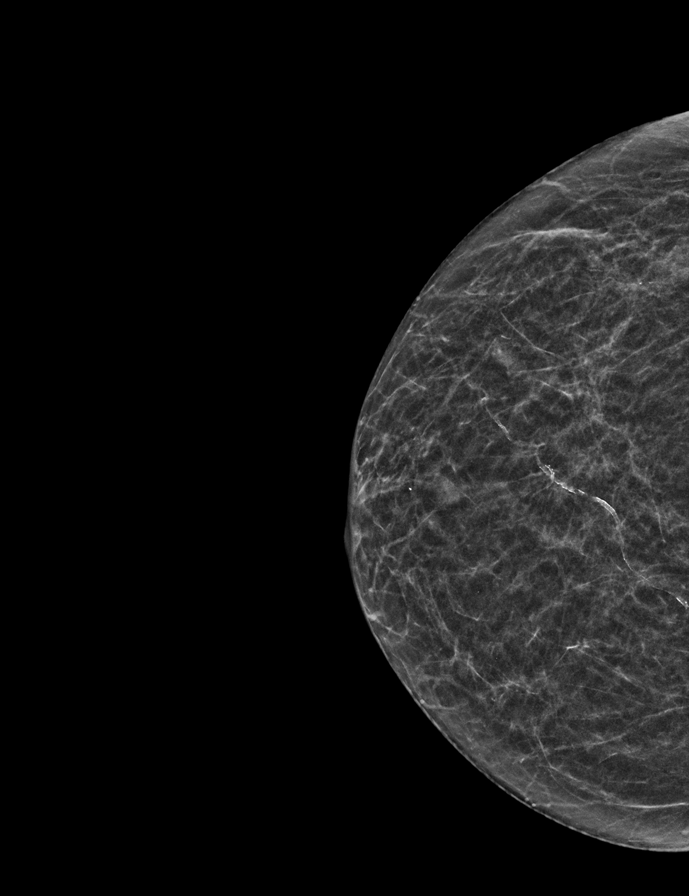

[L MLO tomo · 2 of 46 frames shown]
[frame 15/46]
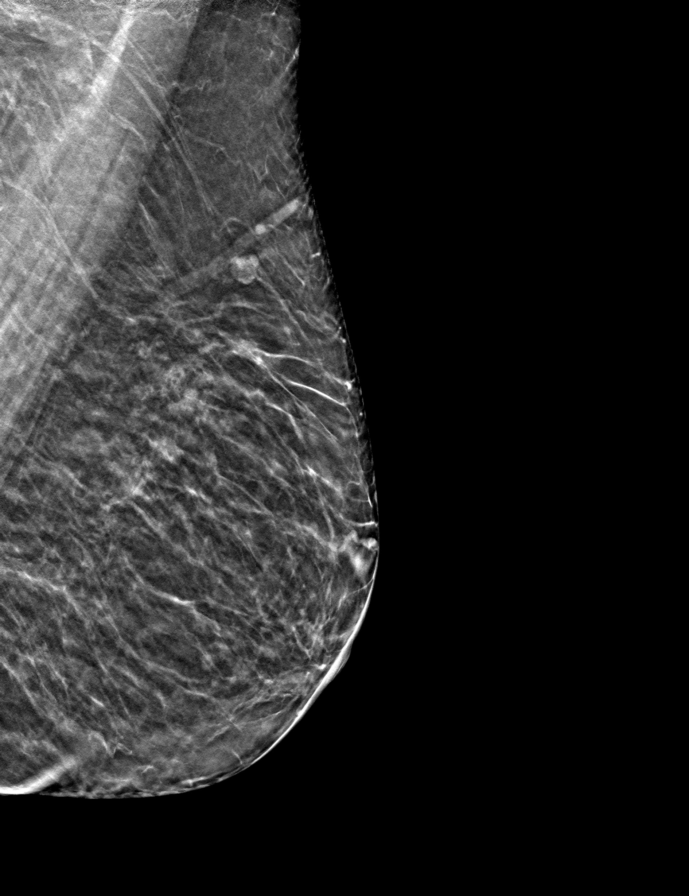
[frame 23/46]
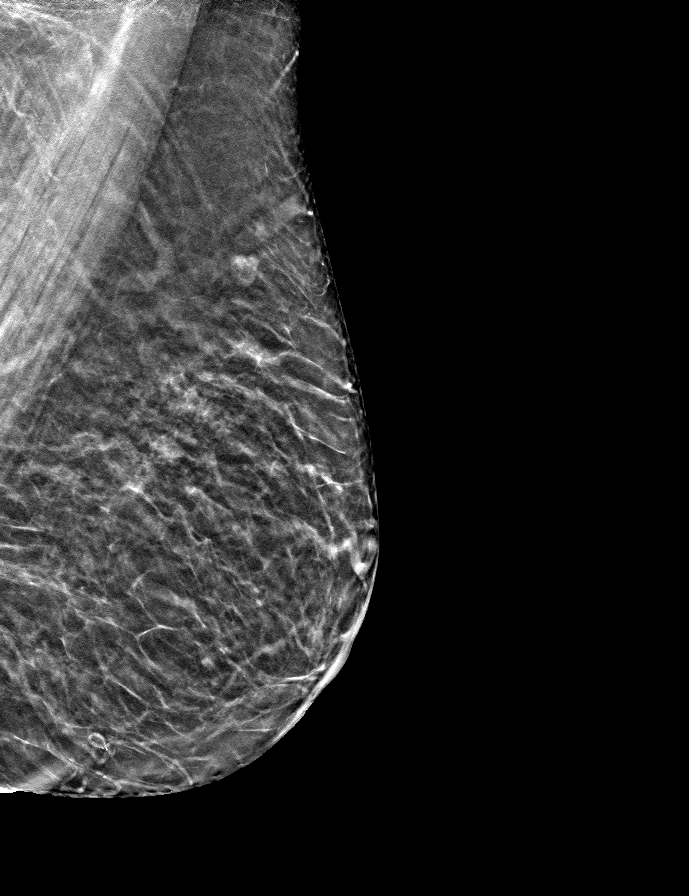

[R MLO tomo · tomo slice 23/44.0]
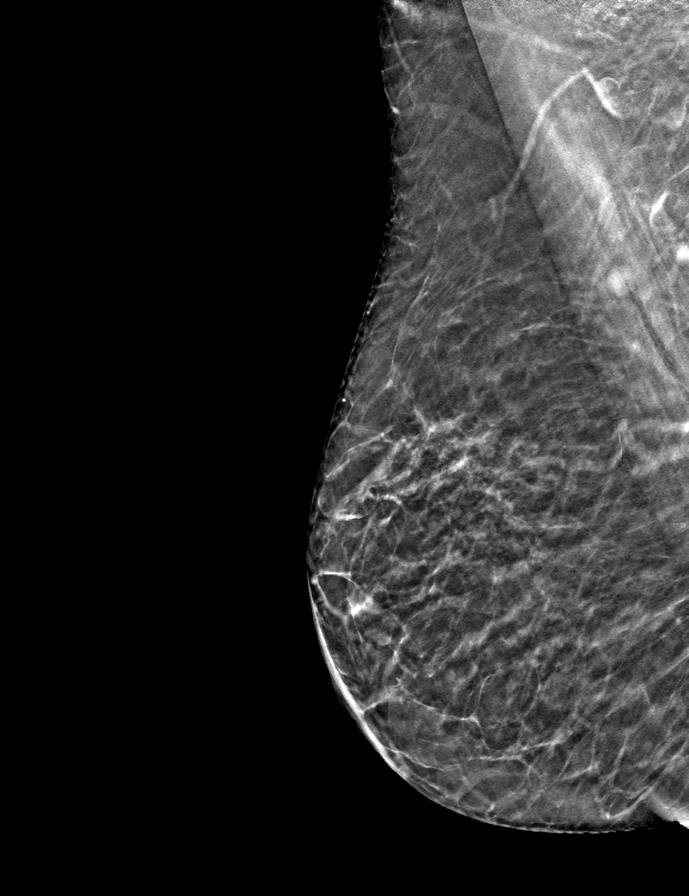

[R CC tomo · tomo slice 19/37.0]
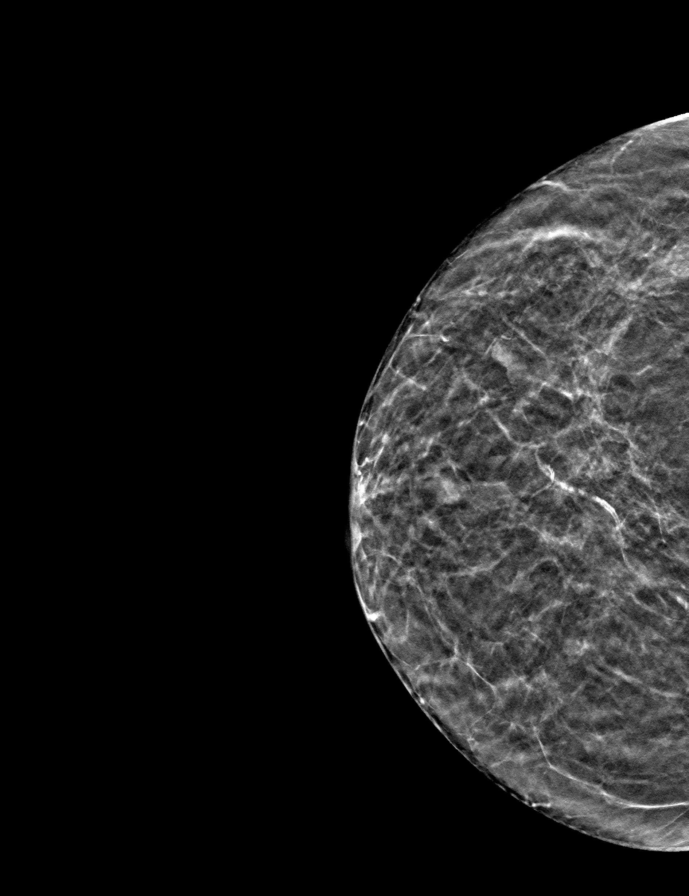

[L CC tomo · tomo slice 20/39.0]
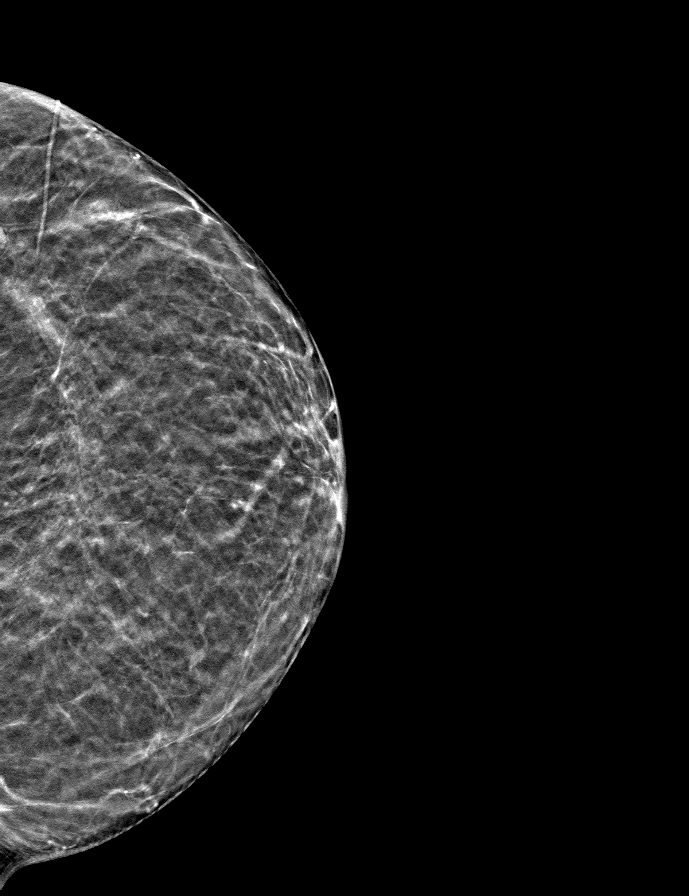

[9 of 24 positions shown; findings below may reference images not displayed]

ACR Breast Density Category b: There are scattered areas of
fibroglandular density.
FINDINGS: There are no findings suspicious for malignancy.
IMPRESSION: No mammographic evidence of malignancy. A result letter of this
screening mammogram will be mailed directly to the patient.

RECOMMENDATION:
Screening mammogram in one year. (Code:51-O-LD2)

BI-RADS CATEGORY  1: Negative.

## 2023-07-03 ENCOUNTER — Encounter: Payer: Self-pay | Admitting: Urology

## 2023-07-03 ENCOUNTER — Ambulatory Visit: Payer: Medicare HMO | Admitting: Urology

## 2023-07-03 ENCOUNTER — Telehealth: Payer: Self-pay

## 2023-07-03 VITALS — BP 144/84 | HR 65 | Temp 98.2°F

## 2023-07-03 DIAGNOSIS — N3941 Urge incontinence: Secondary | ICD-10-CM

## 2023-07-03 DIAGNOSIS — Z9229 Personal history of other drug therapy: Secondary | ICD-10-CM

## 2023-07-03 LAB — BLADDER SCAN AMB NON-IMAGING: Scan Result: 0

## 2023-07-03 LAB — URINALYSIS, ROUTINE W REFLEX MICROSCOPIC
Bilirubin, UA: NEGATIVE
Glucose, UA: NEGATIVE
Ketones, UA: NEGATIVE
Leukocytes,UA: NEGATIVE
Nitrite, UA: NEGATIVE
Protein,UA: NEGATIVE
RBC, UA: NEGATIVE
Specific Gravity, UA: 1.02 (ref 1.005–1.030)
Urobilinogen, Ur: 0.2 mg/dL (ref 0.2–1.0)
pH, UA: 5.5 (ref 5.0–7.5)

## 2023-07-03 MED ORDER — ENALAPRIL MALEATE 10 MG PO TABS: 10.0000 mg | ORAL_TABLET | Freq: Every day | ORAL | 0 refills | Status: DC

## 2023-07-03 NOTE — Telephone Encounter (Signed)
Received via fax Rx request: Prescription sent electronically to pharmacy  

## 2023-07-03 NOTE — Telephone Encounter (Signed)
Prescription Request  07/03/2023  LOV: Visit date not found  What is the name of the medication or equipment? enalapril (VASOTEC) 10 MG tablet   Have you contacted your pharmacy to request a refill? Yes   Which pharmacy would you like this sent to? [facility]  Mail order    Patient notified that their request is being sent to the clinical staff for review and that they should receive a response within 2 business days.   Please advise at Mobile [phone] (mobile)

## 2023-07-04 ENCOUNTER — Telehealth: Payer: Self-pay | Admitting: *Deleted

## 2023-07-04 NOTE — Telephone Encounter (Signed)
Pt called and stated that she wanted to cancel the colonoscopy but keep the EGD/ED. She says she would rather do the cologuard. Message has been sent to Endo to cancel the colonoscopy. FYI

## 2023-07-06 ENCOUNTER — Encounter: Payer: Self-pay | Admitting: *Deleted

## 2023-07-06 NOTE — Telephone Encounter (Signed)
Noted, thanks!

## 2023-07-06 NOTE — Telephone Encounter (Signed)
Sent instructions via MyChart for EGD. Pt will contact her PCP to arrange for cologuard. FYI

## 2023-07-11 ENCOUNTER — Ambulatory Visit: Payer: Medicare HMO | Admitting: Gastroenterology

## 2023-07-11 NOTE — Progress Notes (Signed)
Intraservice. Urojet was instilled using sterile technique into the urethra without difficulty.  Anesthesia: 1% plain lidocaine 50 cc instilled via careful sterile placement of a foley catheter. The bladder was allowed to achieve anesthesia via gently rolling the patient side to side for 15 minutes. The catheter was removed after the instillation. TIME ELEMENT = 15 MINUTES  Prep: BOTOX(R) (Allergan, [place], [place], 100 Units per vial) was reconstituted gently with 10 mL of 0.9% sterile, nonpreserved saline solution and drawn into a 10-mL syringe. An additional 1 mL of sterile, nonpreserved saline solution was drawn into a separate syringe for the final injection so that the remaining BOTOX(R) in the needle was delivered to the bladder. TIME ELEMENT = 15 MINUTES  Procedure: A rigid cystoscope was gently inserted into the urinary bladder via the urethra. The trigone was identified and evaluated. Next we identified 20 template sites within the urinary bladder. The bladder is only instilled with 100 cc volume to ensure adequate muscle wall thickness to prevent needle penetration beyond the muscle wall. Careful injections were undertaken for a total of 20 injections using a Laborie depth-limiting needle [employer] [place], [place]). 0.5 cc volume was delivered at each site carefully into the muscle without excessive bleb formation submucosally. Targeted zones were injected over TIME ELEMENT =15 MINUTES.  Postservice: The patient was monitored in recovery for 30 minutes. Patient voided successfully. Clean intermittent catheterization training was reviewed again for the patient. Respiratory and cardiac status were confirmed for stability. Mentation is assessed to be adequate for discharge with alteration in status. TIME ELEMENT = 30 MINUTES. TOTAL PROCEDURE TIME: 1 HR AND 10 MINUTES  Qty: 100 Unit: kit Route: Intra-vesically Freq: None Site: None  Mfgr: Allergan  Patient was instructed  to call the office immediately for bloody urine, difficulty urinating, urinary retention, painful or frequent urination, fever, chills, nausea, vomiting, or other illness. The patient stated that she understood these instructions and would comply with them. Patient was provided prophylactic antibiotics (except aminoglycosides) for 1 to 3 days postprocedure.

## 2023-07-11 NOTE — Patient Instructions (Signed)
Botulinum Toxin Bladder Injection  A botulinum toxin bladder injection is a procedure to treat an overactive bladder. During the procedure, a drug called botulinum toxin is injected into the bladder through a long, thin needle. This drug relaxes the bladder muscles and reduces overactivity. You may need this procedure if your medicines are not working or you cannot take them. The procedure may be repeated as needed. The treatment is done once and it usually lasts for 6 months. Your health care provider will monitor you to see how well you respond. Tell a health care provider about: Any allergies you have. All medicines you are taking, including vitamins, herbs, eye drops, creams, and over-the-counter medicines. Any problems you or family members have had with anesthetic medicines. Any bleeding problems you have. Any surgeries you have had. Any medical conditions you have. Any previous reactions to a botulinum toxin injection. Any symptoms of urinary tract infection. These include chills, fever, a burning feeling when passing urine, and needing to pass urine often. Whether you are pregnant or may be pregnant. What are the risks? Generally this is a safe procedure. However, problems may occur, including: Not being able to pass urine. If this happens, you may need to have your bladder emptied with a thin tube (urinary catheter). Bleeding. Urinary tract infection. Allergic reaction to the botulinum toxin. Pain or burning when passing urine. Damage to nearby structures or organs. What happens before the procedure? When to stop eating and drinking Follow instructions from your health care provider about what you may eat and drink before your procedure. These may include: 8 hours before the procedure Stop eating most foods. Do not eat meat, fried foods, or fatty foods. Eat only light foods, such as toast or crackers. All liquids are okay except energy drinks and alcohol. 6 hours before the  procedure Stop eating. Drink only clear liquids, such as water, clear fruit juice, black coffee, plain tea, and sports drinks. Do not drink energy drinks or alcohol. 2 hours before the procedure Stop drinking all liquids. You may be allowed to take medicines with small sips of water. If you do not follow your health care provider's instructions, your procedure may be delayed or canceled. Medicines Ask your health care provider about: Changing or stopping your regular medicines. This is especially important if you are taking diabetes medicines or blood thinners. Taking medicines such as aspirin and ibuprofen. These medicines can thin your blood. Do not take these medicines unless your health care provider tells you to take them. Taking over-the-counter medicines, vitamins, herbs, and supplements. General instructions Ask your health care provider what steps will be taken to help prevent infection. These steps may include: Removing hair at the procedure site. Washing skin with a germ-killing soap. Taking antibiotic medicine. If you will be going home right after the procedure, plan to have a responsible adult: Take you home from the hospital or clinic. You will not be allowed to drive. Care for you for the time you are told. What happens during the procedure?  You will be asked to empty your bladder. An IV will be inserted into one of your veins. You will be given one or more of the following: A medicine to help you relax (sedative). A medicine to numb the area (local anesthetic). A medicine to make you fall asleep (general anesthetic). A long, thin scope called a cystoscope will be passed into your bladder through the part of the body that carries urine from your bladder (urethra). The cystoscope  will be used to fill your bladder with water. A long needle will be passed through the cystoscope and into the bladder. The botulinum toxin will be injected into your bladder. It may be  injected into multiple areas of your bladder. The cystoscope will be removed and your bladder will be emptied with a urinary catheter. The procedure may vary among health care providers and hospitals. What can I expect after the procedure? After your procedure, it is common to have: Blood-tinged urine. Burning or soreness when you pass urine. Follow these instructions at home: Medicines Take over-the-counter and prescription medicines only as told by your health care provider. If you were prescribed an antibiotic medicine, take it as told by your health care provider. Do not stop using the antibiotic even if you start to feel better. General instructions  If you were given a sedative during the procedure, it can affect you for several hours. Do not drive or operate machinery until your health care provider says that it is safe. Drink enough fluid to keep your urine pale yellow. Return to your normal activities as told by your health care provider. Ask your health care provider what activities are safe for you. Keep all follow-up visits. Contact a health care provider if you have: A fever or chills. Blood-tinged urine for more than one day after your procedure. Worsening pain or burning when you pass urine. Pain or burning when passing urine for more than two days after your procedure. Trouble emptying your bladder. Get help right away if you: Have bright red blood in your urine. Are unable to pass urine. Summary A botulinum toxin bladder injection is a procedure to treat an overactive bladder. This is generally a safe procedure. However, problems may occur, including not being able to pass urine, bleeding, infection, pain, and an allergic reaction to the botulinum toxin. You will be told when to stop eating and drinking, and what medicines to change or stop. Follow instructions carefully. After the procedure, it is common to have blood in your urine and to have soreness or burning when  passing urine. Contact a health care provider if you have a fever, blood in your urine for more than a few days, or trouble passing urine. Get help right away if you have bright red blood in your urine, or if you are unable to pass urine. This information is not intended to replace advice given to you by your health care provider. Make sure you discuss any questions you have with your health care provider. Document Revised: 06/04/2021 Document Reviewed: 06/04/2021 Elsevier Patient Education  2024 [employer].

## 2023-07-12 ENCOUNTER — Encounter (HOSPITAL_COMMUNITY): Payer: Self-pay

## 2023-07-12 ENCOUNTER — Encounter (HOSPITAL_COMMUNITY)
Admission: RE | Admit: 2023-07-12 | Discharge: 2023-07-12 | Disposition: A | Discharge status: Home / Self Care | Payer: Medicare HMO | Source: Ambulatory Visit | Attending: Internal Medicine | Admitting: Internal Medicine

## 2023-07-12 ENCOUNTER — Other Ambulatory Visit: Payer: Self-pay

## 2023-07-17 ENCOUNTER — Ambulatory Visit (HOSPITAL_BASED_OUTPATIENT_CLINIC_OR_DEPARTMENT_OTHER): Payer: Medicare HMO | Admitting: Anesthesiology

## 2023-07-17 ENCOUNTER — Ambulatory Visit (HOSPITAL_COMMUNITY)
Admission: RE | Admit: 2023-07-17 | Discharge: 2023-07-17 | Disposition: A | Discharge status: Home / Self Care | Payer: Medicare HMO | Attending: Internal Medicine | Admitting: Internal Medicine

## 2023-07-17 ENCOUNTER — Encounter (HOSPITAL_COMMUNITY): Payer: Self-pay

## 2023-07-17 ENCOUNTER — Ambulatory Visit (HOSPITAL_COMMUNITY): Payer: Medicare HMO | Admitting: Anesthesiology

## 2023-07-17 ENCOUNTER — Encounter (HOSPITAL_COMMUNITY)
Admission: RE | Disposition: A | Discharge status: Home / Self Care | Payer: Self-pay | Source: Home / Self Care | Attending: Internal Medicine

## 2023-07-17 DIAGNOSIS — E559 Vitamin D deficiency, unspecified: Secondary | ICD-10-CM | POA: Insufficient documentation

## 2023-07-17 DIAGNOSIS — Z79899 Other long term (current) drug therapy: Secondary | ICD-10-CM | POA: Insufficient documentation

## 2023-07-17 DIAGNOSIS — R1310 Dysphagia, unspecified: Secondary | ICD-10-CM

## 2023-07-17 DIAGNOSIS — Z85850 Personal history of malignant neoplasm of thyroid: Secondary | ICD-10-CM | POA: Diagnosis not present

## 2023-07-17 DIAGNOSIS — E039 Hypothyroidism, unspecified: Secondary | ICD-10-CM | POA: Diagnosis not present

## 2023-07-17 DIAGNOSIS — E538 Deficiency of other specified B group vitamins: Secondary | ICD-10-CM | POA: Insufficient documentation

## 2023-07-17 DIAGNOSIS — K222 Esophageal obstruction: Secondary | ICD-10-CM

## 2023-07-17 DIAGNOSIS — Z9884 Bariatric surgery status: Secondary | ICD-10-CM | POA: Diagnosis not present

## 2023-07-17 DIAGNOSIS — I10 Essential (primary) hypertension: Secondary | ICD-10-CM | POA: Diagnosis not present

## 2023-07-17 DIAGNOSIS — Z980 Intestinal bypass and anastomosis status: Secondary | ICD-10-CM

## 2023-07-17 DIAGNOSIS — K219 Gastro-esophageal reflux disease without esophagitis: Secondary | ICD-10-CM | POA: Diagnosis not present

## 2023-07-17 DIAGNOSIS — R1314 Dysphagia, pharyngoesophageal phase: Secondary | ICD-10-CM | POA: Diagnosis not present

## 2023-07-17 DIAGNOSIS — Z96643 Presence of artificial hip joint, bilateral: Secondary | ICD-10-CM | POA: Diagnosis not present

## 2023-07-17 DIAGNOSIS — E890 Postprocedural hypothyroidism: Secondary | ICD-10-CM | POA: Diagnosis not present

## 2023-07-17 DIAGNOSIS — E785 Hyperlipidemia, unspecified: Secondary | ICD-10-CM

## 2023-07-17 DIAGNOSIS — I340 Nonrheumatic mitral (valve) insufficiency: Secondary | ICD-10-CM | POA: Insufficient documentation

## 2023-07-17 DIAGNOSIS — K229 Disease of esophagus, unspecified: Secondary | ICD-10-CM

## 2023-07-17 HISTORY — PX: ESOPHAGOGASTRODUODENOSCOPY (EGD) WITH PROPOFOL: SHX5813

## 2023-07-17 HISTORY — PX: BALLOON DILATION: SHX5330

## 2023-07-17 SURGERY — ESOPHAGOGASTRODUODENOSCOPY (EGD) WITH PROPOFOL
Anesthesia: General

## 2023-07-17 MED ORDER — LIDOCAINE HCL (CARDIAC) PF 100 MG/5ML IV SOSY
PREFILLED_SYRINGE | INTRAVENOUS | Status: DC | PRN
  Administered 2023-07-17: 50 mg via INTRAVENOUS

## 2023-07-17 MED ORDER — LACTATED RINGERS IV SOLN: INTRAVENOUS | Status: DC

## 2023-07-17 MED ORDER — PROPOFOL 10 MG/ML IV BOLUS
INTRAVENOUS | Status: DC | PRN
Start: 2023-07-17 — End: 2023-07-17
  Administered 2023-07-17: 100 mg via INTRAVENOUS
  Administered 2023-07-17 (×2): 40 mg via INTRAVENOUS

## 2023-07-17 NOTE — Interval H&P Note (Signed)
History and Physical Interval Note:  07/17/2023 7:53 AM  [name]  has presented today for surgery, with the diagnosis of CA screen, dysphagia, gerd.  The various methods of treatment have been discussed with the patient and family. After consideration of risks, benefits and other options for treatment, the patient has consented to  Procedure(s): ESOPHAGOGASTRODUODENOSCOPY (EGD) WITH L (N/A) BALLOON DILATION (N/A) as a surgical intervention.  The patient's history has been reviewed, patient examined, no change in status, [name] for surgery.  I have reviewed the patient's chart and labs.  Questions were answered to the patient's satisfaction.     Lanelle Bal

## 2023-07-17 NOTE — Op Note (Addendum)
[facility] Patient Name: [name] Procedure Date: 07/17/2023 7:54 AM MRN: [id number] Date of Birth: May 09, 1946 Attending MD: Hennie Duos. Marletta Lor , [place], [id number] CSN: [id number] Age: 77 Admit Type: Outpatient Procedure:                Upper GI endoscopy Indications:              Dysphagia Providers:                Hennie Duos. Marletta Lor, DO, Crystal Page, Pandora Leiter                            Tech., Technician Referring MD:              Medicines:                See the Anesthesia note for documentation of the                            administered medications Complications:            No immediate complications. Estimated Blood Loss:     Estimated blood loss was minimal. Procedure:                Pre-Anesthesia Assessment:                           - The anesthesia plan was to use monitored                            anesthesia care (MAC).                           After obtaining informed consent, the endoscope was                            passed under direct vision. Throughout the                            procedure, the patient's blood pressure, pulse, and                            oxygen saturations were monitored continuously. The                            [id number]-H190 ([id number]) scope was introduced through the                            mouth, and advanced to the efferent jejunal loop.                            The upper GI endoscopy was accomplished without                            difficulty. The patient tolerated the procedure                            well. Scope In: 8:04:52 AM Scope Out:  8:12:08 AM Total Procedure Duration: 0 hours 7 minutes 16 seconds  Findings:      No obvious stricture noted in oropharynx. Slightly tortuous, was able to       pass endoscope into esophagus without difficulty.      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 A TTS dilator       was passed through the scope. Dilation with an 18-19-20 mm balloon       dilator  was performed to 20 mm. The dilation site was examined and       showed mild mucosal disruption and moderate improvement in luminal       narrowing.      Evidence of a Roux-en-Y gastrojejunostomy was found. The gastrojejunal       anastomosis was characterized by healthy appearing mucosa. This was       traversed. The pouch-to-jejunum limb was characterized by healthy       appearing mucosa.      The examined jejunum was normal. Impression:               - Normal oropharynx.                           - Mild Schatzki ring. Dilated.                           - Roux-en-Y gastrojejunostomy with gastrojejunal                            anastomosis characterized by healthy appearing                            mucosa.                           - Normal examined jejunum.                           - No specimens collected. Moderate Sedation:      Per Anesthesia Care Recommendation:           - Patient has a contact number available for                            emergencies. The signs and symptoms of potential                            delayed complications were discussed with the                            patient. Return to normal activities tomorrow.                            Written discharge instructions were provided to the                            patient.                           - Resume previous diet.                           -  Continue present medications.                           - Repeat upper endoscopy PRN for retreatment.                           - Return to GI clinic PRN.                           - Will reach out to patient's cardiologist, okay to                            proceed with repeat TEE. Procedure Code(s):        --- Professional ---                           [phone], Esophagogastroduodenoscopy, flexible,                            transoral; with transendoscopic balloon dilation of                            esophagus (less than 30 mm diameter) Diagnosis  Code(s):        --- Professional ---                           K22.2, Esophageal obstruction                           Z98.0, Intestinal bypass and anastomosis status                           R13.10, Dysphagia, unspecified CPT copyright 2022 American Medical Association. All rights reserved. The codes documented in this report are preliminary and upon coder review may  be revised to meet current compliance requirements. Hennie Duos. Marletta Lor, DO Hennie Duos. Marletta Lor, DO 07/17/2023 8:17:43 AM This report has been signed electronically. Number of Addenda: 0

## 2023-07-17 NOTE — Progress Notes (Signed)
Patient has a blood blister on her lower right lip.  Blister was present on arrival prior to procedure.

## 2023-07-17 NOTE — Transfer of Care (Signed)
Immediate Anesthesia Transfer of Care Note  Patient: [name]  Procedure(s) Performed: ESOPHAGOGASTRODUODENOSCOPY (EGD) WITH L BALLOON DILATION  Patient Location: Short Stay  Anesthesia Type:General  Level of Consciousness: drowsy  Airway & Oxygen Therapy: Patient Spontanous Breathing  Post-op Assessment: Report given to RN and Post -op Vital signs reviewed and stable  Post vital signs: Reviewed and stable  Last Vitals:  Vitals Value Taken Time  BP 85/43 07/17/23 0813  Temp 36.7 C 07/17/23 0813  Pulse 64 07/17/23 0813  Resp 19 07/17/23 0813  SpO2 100 % 07/17/23 0813    Last Pain:  Vitals:   07/17/23 0813  TempSrc:   PainSc: Asleep      Patients Stated Pain Goal: 5 (07/17/23 0654)  Complications: No notable events documented.

## 2023-07-17 NOTE — Anesthesia Procedure Notes (Signed)
Date/Time: 07/17/2023 8:01 AM  Performed by: Julian Reil, CRNAPre-anesthesia Checklist: Patient identified, Emergency Drugs available, Suction available and Patient being monitored Patient Re-evaluated:Patient Re-evaluated prior to induction Oxygen Delivery Method: Nasal cannula Induction Type: IV induction Placement Confirmation: positive ETCO2

## 2023-07-17 NOTE — Anesthesia Preprocedure Evaluation (Addendum)
Anesthesia Evaluation  Patient identified by MRN, date of birth, ID band Patient awake    Reviewed: Allergy & Precautions, H&P , NPO status , Patient's Chart, lab work & pertinent test results  History of Anesthesia Complications (+) PONV, PROLONGED EMERGENCE, Family history of anesthesia reaction and history of anesthetic complications  Airway Mallampati: II  TM Distance: >3 FB Neck ROM: Full    Dental  (+) Missing, Chipped, Dental Advisory Given   Pulmonary neg pulmonary ROS   Pulmonary exam normal breath sounds clear to auscultation       Cardiovascular hypertension, Pt. on medications + Valvular Problems/Murmurs  Rhythm:Regular Rate:Normal + Systolic murmurs  1. Left ventricular ejection fraction, by estimation, is 60 to 65%. The  left ventricle has normal function. The left ventricle has no regional  wall motion abnormalities. Left ventricular diastolic parameters are  consistent with Grade II diastolic  dysfunction (pseudonormalization). The average left ventricular global  longitudinal strain is -29.1 %.   2. Right ventricular systolic function is mildly reduced. The right  ventricular size is normal.   3. Left atrial size was severely dilated.   4. Mitral valve leaflets are not adequately visualized. There appears to  be a mobile echodensity 0.59 x 0.89 cm attached possibly to the anterior  mitral valve leaflet by a stalk and is noted on the atrial side during  systole. Differential diagnoses  include chordal rupture, flail mitral valve leaflet, tumor, thrombus,  vegetation. Clinical correlation is recommended. Recommend TEE to further  evaluate the mitral valve pathology. Moderate eccentric anteriorly  directed mitral valve regurgitation. No  evidence of mitral stenosis.   5. The aortic valve was not well visualized. Aortic valve regurgitation  is not visualized. No aortic stenosis is present.   6. The inferior vena  cava is normal in size with greater than 50%  respiratory variability, suggesting right atrial pressure of 3 mmHg.     Neuro/Psych negative neurological ROS  negative psych ROS   GI/Hepatic Neg liver ROS,GERD  Medicated,,  Endo/Other  Hypothyroidism    Renal/GU negative Renal ROS  negative genitourinary   Musculoskeletal  (+) Arthritis , Osteoarthritis,    Abdominal   Peds negative pediatric ROS (+)  Hematology negative hematology ROS (+)   Anesthesia Other Findings B12 deficiency Esophageal abnormality Essential hypertension, benign GERD (gastroesophageal reflux disease) History of gastric bypass History of thyroid cancer Hyperlipidemia Hypothyroidism, postsurgical MR (mitral regurgitation) Murmur, cardiac Status post total hip replacement, 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 Urge incontinence Vitamin D deficiency    Reproductive/Obstetrics negative OB ROS                             Anesthesia Physical Anesthesia Plan  ASA: 3  Anesthesia Plan: General   Post-op Pain Management: Minimal or no pain anticipated   Induction: Intravenous  PONV Risk Score and Plan: 1 and Propofol infusion  Airway Management Planned: Nasal Cannula and Natural Airway  Additional Equipment:   Intra-op Plan:   Post-operative Plan:   Informed Consent: I have reviewed the patients History and Physical, chart, labs and discussed the procedure including the risks, benefits and alternatives for the proposed anesthesia with the patient or authorized representative who has indicated his/her understanding and acceptance.     Dental advisory given  Plan Discussed with: CRNA and Surgeon  Anesthesia Plan Comments:        Anesthesia Quick Evaluation

## 2023-07-17 NOTE — Discharge Instructions (Addendum)
EGD Discharge instructions Please read the instructions outlined below and refer to this sheet in the next few weeks. These discharge instructions provide you with general information on caring for yourself after you leave the hospital. Your doctor may also give you specific instructions. While your treatment has been planned according to the most current medical practices available, unavoidable complications occasionally occur. If you have any problems or questions after discharge, please call your doctor. ACTIVITY You may resume your regular activity but move at a slower pace for the next 24 hours.  Take frequent rest periods for the next 24 hours.  Walking will help expel (get rid of) the air and reduce the bloated feeling in your abdomen.  No driving for 24 hours (because of the anesthesia (medicine) used during the test).  You may shower.  Do not sign any important legal documents or operate any machinery for 24 hours (because of the anesthesia used during the test).  NUTRITION Drink plenty of fluids.  You may resume your normal diet.  Begin with a light meal and progress to your normal diet.  Avoid alcoholic beverages for 24 hours or as instructed by your caregiver.  MEDICATIONS You may resume your normal medications unless your caregiver tells you otherwise.  WHAT YOU CAN EXPECT TODAY You may experience abdominal discomfort such as a feeling of fullness or "gas" pains.  FOLLOW-UP Your doctor will discuss the results of your test with you.  SEEK IMMEDIATE MEDICAL ATTENTION IF ANY OF THE FOLLOWING OCCUR: Excessive nausea (feeling sick to your stomach) and/or vomiting.  Severe abdominal pain and distention (swelling).  Trouble swallowing.  Temperature over 101 F (37.8 C).  Rectal bleeding or vomiting of blood.   Your upper endoscopy revealed a tightening of your esophagus called a Schatzki's ring at the end portion of your esophagus.  I stretched this out today.  Previous gastric  bypass surgery looks good.  Exam otherwise unremarkable.  I will reach out to your cardiologist in regards to repeat TEE.  Otherwise follow-up with GI as needed.   I hope you have a great rest of your week!  Hennie Duos. Marletta Lor, D.O. Gastroenterology and Hepatology [facility]

## 2023-07-17 NOTE — Anesthesia Postprocedure Evaluation (Signed)
Anesthesia Post Note  Patient: [name]  Procedure(s) Performed: ESOPHAGOGASTRODUODENOSCOPY (EGD) WITH L BALLOON DILATION  Patient location during evaluation: PACU Anesthesia Type: General Level of consciousness: awake and alert and oriented Pain management: pain level controlled Vital Signs Assessment: post-procedure vital signs reviewed and stable Respiratory status: spontaneous breathing, nonlabored ventilation and respiratory function stable Cardiovascular status: blood pressure returned to baseline and stable Postop Assessment: no apparent nausea or vomiting Anesthetic complications: no  No notable events documented.   Last Vitals:  Vitals:   07/17/23 0813 07/17/23 0815  BP: (!) 85/43 (!) 101/55  Pulse: 64   Resp: 19   Temp: 36.7 C   SpO2: 100%     Last Pain:  Vitals:   07/17/23 0815  TempSrc:   PainSc: 0-No pain                  C 

## 2023-07-18 ENCOUNTER — Ambulatory Visit: Payer: Medicare HMO | Admitting: Urology

## 2023-07-20 ENCOUNTER — Encounter (HOSPITAL_COMMUNITY): Payer: Self-pay | Admitting: Internal Medicine

## 2023-08-07 ENCOUNTER — Telehealth: Payer: Self-pay

## 2023-08-07 NOTE — Telephone Encounter (Signed)
Patient called office to notify Maralyn Sago that the botox injection patient received in July she does not feel it is working 100%.  Patient would like to know they next steps. Message sent to provider.

## 2023-08-08 NOTE — Telephone Encounter (Signed)
Patient states she feels the botox has only worked about 40 %. Patient states she has already tried [place] and the gemtesa and neither worked for her. Patient thinks she may need more botox. Please advise.

## 2023-08-09 NOTE — Telephone Encounter (Signed)
Patient called with no answer. Detailed message left. Also made aware a my chart message would be sent.

## 2023-08-31 NOTE — Progress Notes (Signed)
Pt needs appt in clinic  Had esophagus dilated in Aug SHouuld have TEE done to better evaluate Mitral valve , mitral regurgitation

## 2023-09-06 ENCOUNTER — Ambulatory Visit (INDEPENDENT_AMBULATORY_CARE_PROVIDER_SITE_OTHER): Payer: Medicare HMO | Admitting: Family Medicine

## 2023-09-06 VITALS — BP 130/80 | HR 63 | Wt 171.2 lb

## 2023-09-06 DIAGNOSIS — N3000 Acute cystitis without hematuria: Secondary | ICD-10-CM | POA: Diagnosis not present

## 2023-09-06 DIAGNOSIS — R300 Dysuria: Secondary | ICD-10-CM

## 2023-09-06 LAB — POCT URINALYSIS DIP (CLINITEK)
Bilirubin, UA: NEGATIVE
Glucose, UA: NEGATIVE mg/dL
Ketones, POC UA: NEGATIVE mg/dL
Nitrite, UA: NEGATIVE
Spec Grav, UA: 1.015 (ref 1.010–1.025)
Urobilinogen, UA: 0.2 E.U./dL
pH, UA: 5 (ref 5.0–8.0)

## 2023-09-06 MED ORDER — CEPHALEXIN 500 MG PO CAPS: 500.0000 mg | ORAL_CAPSULE | Freq: Two times a day (BID) | ORAL | 0 refills | Status: DC

## 2023-09-06 NOTE — Patient Instructions (Signed)
Medication as prescribed.  See you at follow up.  Take care  Dr. Adriana Simas

## 2023-09-09 LAB — URINE CULTURE

## 2023-09-10 ENCOUNTER — Other Ambulatory Visit: Payer: Self-pay | Admitting: Family Medicine

## 2023-09-10 DIAGNOSIS — N3000 Acute cystitis without hematuria: Secondary | ICD-10-CM | POA: Insufficient documentation

## 2023-09-10 MED ORDER — SULFAMETHOXAZOLE-TRIMETHOPRIM 800-160 MG PO TABS: 1.0000 | ORAL_TABLET | Freq: Two times a day (BID) | ORAL | 0 refills | Status: DC

## 2023-09-10 NOTE — Assessment & Plan Note (Signed)
Urinalysis with leukocytes. Placed empirically on Keflex.  Culture has returned with resistance.  Rx has been sent for Bactrim.

## 2023-09-10 NOTE — Progress Notes (Signed)
Subjective:  Patient ID: [name], female    DOB: May 14, 1946  Age: 77 y.o. MRN: [id number]  CC: Urinary symptoms   HPI:  77 year old female presents for evaluation of the above.  Symptoms started on Sunday.  She reports frequency and urgency.  Mild dysuria.  No abdominal pain.  No flank pain.  No fever.  Patient Active Problem List   Diagnosis Date Noted   Acute cystitis without hematuria 09/10/2023   Esophageal abnormality 06/27/2023   Status post total replacement of left hip 11/21/2022   MR (mitral regurgitation) 11/08/2022   Murmur, cardiac 11/01/2022   GERD (gastroesophageal reflux disease) 05/11/2022   Urge incontinence 02/21/2022   B12 deficiency 11/10/2021   Vitamin D deficiency 11/10/2021   Status post total hip replacement, right 12/22/2020   History of gastric bypass 11/02/2020   History of thyroid cancer 10/22/2020   Hypothyroidism, 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   Essential hypertension, benign 09/09/2013   Hyperlipidemia 09/09/2013    Social Hx   Social History   Socioeconomic History   Marital status: Widowed    Spouse name: Not on file   Number of children: Not on file   Years of education: Not on file   Highest education level: Some college, no degree  Occupational History   Occupation: Retired Clinical biochemist  Tobacco Use   Smoking status: Never   Smokeless tobacco: Never  Vaping Use   Vaping status: Never Used  Substance and Sexual Activity   Alcohol use: No   Drug use: No   Sexual activity: Not on file  Other Topics Concern   Not on file  Social History Narrative   Not on file   Social Determinants of Health   Financial Resource Strain: Low Risk  (09/06/2023)   Overall Financial Resource Strain (CARDIA)    Difficulty of Paying Living Expenses: Not very hard  Food Insecurity: No Food Insecurity (09/06/2023)   Hunger Vital Sign    Worried About Running Out of Food in the Last Year: Never true    Ran Out of Food in the Last Year:  Never true  Transportation Needs: No Transportation Needs (09/06/2023)   PRAPARE - Administrator, Civil Service (Medical): No    Lack of Transportation (Non-Medical): No  Physical Activity: Unknown (09/06/2023)   Exercise Vital Sign    Days of Exercise per Week: 4 days    Minutes of Exercise per Session: Not on file  Stress: No Stress Concern Present (09/06/2023)   [employer] of Occupational Health - Occupational Stress Questionnaire    Feeling of Stress : Not at all  Social Connections: Moderately Integrated (09/06/2023)   Social Connection and Isolation Panel [NHANES]    Frequency of Communication with Friends and Family: More than three times a week    Frequency of Social Gatherings with Friends and Family: Twice a week    Attends Religious Services: More than 4 times per year    Active Member of [employer] or Organizations: Yes    Attends Banker Meetings: More than 4 times per year    Marital Status: Widowed    Review of Systems Per HPI  Objective:  BP 130/80   Pulse 63   Wt 171 lb 3.2 oz (77.7 kg)   SpO2 98%   BMI 26.81 kg/m      09/06/2023    4:19 PM 07/17/2023    8:15 AM 07/17/2023    8:13 AM  BP/Weight  Systolic BP 130 101 85  Diastolic BP 80 55 43  Wt. (Lbs) 171.2    BMI 26.81 kg/m2      Physical Exam Vitals and nursing note reviewed.  Constitutional:      General: She is not in acute distress.    Appearance: Normal appearance.  HENT:     Head: Normocephalic and atraumatic.  Cardiovascular:     Rate and Rhythm: Normal rate.     Heart sounds: Murmur heard.  Pulmonary:     Effort: Pulmonary effort is normal.     Breath sounds: Normal breath sounds.  Abdominal:     General: There is no distension.     Palpations: Abdomen is soft.     Tenderness: There is no abdominal tenderness.  Neurological:     Mental Status: She is alert.     Lab Results  Component Value Date   WBC 4.7 02/07/2023   HGB 12.3 02/07/2023   HCT 36.9  02/07/2023   PLT 212 02/07/2023   GLUCOSE 81 02/07/2023   CHOL 171 02/07/2023   TRIG 82 02/07/2023   HDL 66 02/07/2023   LDLCALC 90 02/07/2023   ALT 16 02/07/2023   AST 13 02/07/2023   NA 141 02/07/2023   K 4.1 02/07/2023   CL 106 02/07/2023   CREATININE 0.97 02/07/2023   BUN 21 02/07/2023   CO2 20 02/07/2023   TSH 2.860 02/07/2023   INR 1.0 12/19/2019     Assessment & Plan:   Problem List Items Addressed This Visit       Genitourinary   Acute cystitis without hematuria - Primary    Urinalysis with leukocytes. Placed empirically on Keflex.  Culture has returned with resistance.  Rx has been sent for Bactrim.      Relevant Orders   POCT URINALYSIS DIP (CLINITEK) (Completed)   Urine Culture (Completed)    Meds ordered this encounter  Medications   cephALEXin (KEFLEX) 500 MG capsule    Sig: Take 1 capsule (500 mg total) by mouth 2 (two) times daily.    Dispense:  14 capsule    Refill:  0    Follow-up:  Return if symptoms worsen or fail to improve.  Everlene Other DO [facility]

## 2023-09-19 ENCOUNTER — Ambulatory Visit (INDEPENDENT_AMBULATORY_CARE_PROVIDER_SITE_OTHER): Payer: Medicare HMO | Admitting: Family Medicine

## 2023-09-19 VITALS — BP 125/80 | HR 60 | Temp 98.1°F | Ht 67.0 in | Wt 168.5 lb

## 2023-09-19 DIAGNOSIS — Z23 Encounter for immunization: Secondary | ICD-10-CM | POA: Diagnosis not present

## 2023-09-19 DIAGNOSIS — E785 Hyperlipidemia, unspecified: Secondary | ICD-10-CM | POA: Diagnosis not present

## 2023-09-19 DIAGNOSIS — I340 Nonrheumatic mitral (valve) insufficiency: Secondary | ICD-10-CM

## 2023-09-19 DIAGNOSIS — I10 Essential (primary) hypertension: Secondary | ICD-10-CM | POA: Diagnosis not present

## 2023-09-19 DIAGNOSIS — K219 Gastro-esophageal reflux disease without esophagitis: Secondary | ICD-10-CM | POA: Diagnosis not present

## 2023-09-19 DIAGNOSIS — E890 Postprocedural hypothyroidism: Secondary | ICD-10-CM | POA: Diagnosis not present

## 2023-09-19 MED ORDER — ENALAPRIL MALEATE 10 MG PO TABS: 10.0000 mg | ORAL_TABLET | Freq: Every day | ORAL | 3 refills | Status: DC

## 2023-09-19 MED ORDER — FAMOTIDINE 40 MG PO TABS: 40.0000 mg | ORAL_TABLET | Freq: Every day | ORAL | 3 refills | Status: DC

## 2023-09-19 NOTE — Patient Instructions (Signed)
Follow up in 6 months.  Call with concerns.  Take care  Dr. Adriana Simas

## 2023-09-19 NOTE — Assessment & Plan Note (Signed)
Follows with Endo.

## 2023-09-19 NOTE — Progress Notes (Signed)
Subjective:  Patient ID: [name], female    DOB: 06/01/1946  Age: 77 y.o. MRN: [id number]  CC: Follow up  HPI:  77 year old female presents for follow-up.  Recent UTI has resolved.  She is asymptomatic and doing well.  Still awaiting cardiology appointment.  Needs TEE.  She has been evaluated by GI and has had endoscopy as she has had previous failed TEE.  She is asymptomatic from a cardiovascular standpoint.  She has no chest pain or shortness of breath.  Hypothyroidism is managed by endocrinology at [facility] due to prior history of thyroid cancer.  Hypertension [name] on enalapril.  Patient states that she is compliant with her vitamin supplementation in the setting of prior gastric bypass.  Needs flu shot today.  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   Essential hypertension, benign 09/09/2013   Hyperlipidemia 09/09/2013    Social Hx   Social History   Socioeconomic History   Marital status: Widowed    Spouse name: Not on file   Number of children: Not on file   Years of education: Not on file   Highest education level: Some college, no degree  Occupational History   Occupation: Retired Clinical biochemist  Tobacco Use   Smoking status: Never   Smokeless tobacco: Never  Vaping Use   Vaping status: Never Used  Substance and Sexual Activity   Alcohol use: No   Drug use: No   Sexual activity: Not on file  Other Topics Concern   Not on file  Social History  Narrative   Not on file   Social Determinants of Health   Financial Resource Strain: Low Risk  (09/06/2023)   Overall Financial Resource Strain (CARDIA)    Difficulty of Paying Living Expenses: Not very hard  Food Insecurity: No Food Insecurity (09/06/2023)   Hunger Vital Sign    Worried About Running Out of Food in the Last Year: Never true    Ran Out of Food in the Last Year: Never true  Transportation Needs: No Transportation Needs (09/06/2023)   PRAPARE - Administrator, Civil Service (Medical): No    Lack of Transportation (Non-Medical): No  Physical Activity: Unknown (09/06/2023)   Exercise Vital Sign    Days of Exercise per Week: 4 days    Minutes of Exercise per Session: Not on file  Stress: No Stress Concern Present (09/06/2023)   [employer] of Occupational Health - Occupational Stress Questionnaire    Feeling of Stress : Not at all  Social Connections: Moderately Integrated (09/06/2023)   Social Connection and Isolation Panel [NHANES]    Frequency of Communication with Friends and Family: More than three times a week    Frequency of Social Gatherings with Friends and Family: Twice a week    Attends Religious Services: More than 4 times per year    Active Member of [employer] or Organizations: Yes    Attends Banker Meetings: More than 4 times per year    Marital Status: Widowed  Review of Systems Per HPI  Objective:  BP 125/80   Pulse 60   Temp 98.1 F (36.7 C)   Ht 5\' 7"  (1.702 m)   Wt 168 lb 8 oz (76.4 kg)   SpO2 100%   BMI 26.39 kg/m      09/19/2023    9:32 AM 09/06/2023    4:19 PM 07/17/2023    8:15 AM  BP/Weight  Systolic BP 125 130 101  Diastolic BP 80 80 55  Wt. (Lbs) 168.5 171.2   BMI 26.39 kg/m2 26.81 kg/m2     Physical Exam Vitals and nursing note reviewed.  Constitutional:      General: She is not in acute distress.    Appearance: Normal appearance.  HENT:     Head: Normocephalic and atraumatic.  Eyes:      General:        Right eye: No discharge.        Left eye: No discharge.     Conjunctiva/sclera: Conjunctivae normal.  Cardiovascular:     Rate and Rhythm: Normal rate and regular rhythm.     Heart sounds: Murmur heard.  Pulmonary:     Effort: Pulmonary effort is normal.     Breath sounds: Normal breath sounds. No wheezing, rhonchi or rales.  Abdominal:     General: There is no distension.     Palpations: Abdomen is soft.     Tenderness: There is no abdominal tenderness.  Neurological:     Mental Status: She is alert.  Psychiatric:        Mood and Affect: Mood normal.        Behavior: Behavior normal.     Lab Results  Component Value Date   WBC 4.7 02/07/2023   HGB 12.3 02/07/2023   HCT 36.9 02/07/2023   PLT 212 02/07/2023   GLUCOSE 81 02/07/2023   CHOL 171 02/07/2023   TRIG 82 02/07/2023   HDL 66 02/07/2023   LDLCALC 90 02/07/2023   ALT 16 02/07/2023   AST 13 02/07/2023   NA 141 02/07/2023   K 4.1 02/07/2023   CL 106 02/07/2023   CREATININE 0.97 02/07/2023   BUN 21 02/07/2023   CO2 20 02/07/2023   TSH 2.860 02/07/2023   INR 1.0 12/19/2019     Assessment & Plan:   Problem List Items Addressed This Visit       Cardiovascular and Mediastinum   Essential hypertension, benign - Primary (Chronic)    [name] on enalapril.  Continue.      Relevant Medications   enalapril (VASOTEC) 10 MG tablet   MR (mitral regurgitation) (Chronic)    Awaiting TEE.      Relevant Medications   enalapril (VASOTEC) 10 MG tablet     Digestive   GERD (gastroesophageal reflux disease)    [name] on Pepcid.      Relevant Medications   famotidine (PEPCID) 40 MG tablet     Endocrine   Hypothyroidism, postsurgical (Chronic)    Follows with Endo.        Other   Hyperlipidemia    [name].  Will continue to monitor.  She is on no pharmacotherapy at this time.      Relevant Medications   enalapril (VASOTEC) 10 MG tablet   Other Visit Diagnoses     Immunization due        Relevant Orders   Flu Vaccine Trivalent High Dose (Fluad) (Completed)       Meds ordered this encounter  Medications  famotidine (PEPCID) 40 MG tablet    Sig: Take 1 tablet (40 mg total) by mouth daily.    Dispense:  90 tablet    Refill:  3   enalapril (VASOTEC) 10 MG tablet    Sig: Take 1 tablet (10 mg total) by mouth daily.    Dispense:  90 tablet    Refill:  3    Follow-up:  Return in about 6 months (around 03/19/2024) for Follow up Chronic medical issues.  Everlene Other DO [facility]

## 2023-09-19 NOTE — Assessment & Plan Note (Signed)
Stable on enalapril.  Continue.

## 2023-09-19 NOTE — Assessment & Plan Note (Signed)
Stable on Pepcid.

## 2023-09-19 NOTE — Assessment & Plan Note (Signed)
Awaiting TEE

## 2023-09-19 NOTE — Assessment & Plan Note (Signed)
Stable.  Will continue to monitor.  She is on no pharmacotherapy at this time.

## 2023-09-21 ENCOUNTER — Ambulatory Visit: Payer: Medicare HMO | Admitting: Family Medicine

## 2023-09-22 ENCOUNTER — Other Ambulatory Visit: Payer: Self-pay | Admitting: Family Medicine

## 2023-09-27 ENCOUNTER — Encounter: Payer: Self-pay | Admitting: Student

## 2023-09-27 ENCOUNTER — Encounter: Payer: Self-pay | Admitting: *Deleted

## 2023-09-27 ENCOUNTER — Ambulatory Visit: Payer: Medicare HMO | Attending: Internal Medicine | Admitting: Student

## 2023-09-27 VITALS — BP 116/70 | HR 61 | Ht 67.0 in | Wt 173.4 lb

## 2023-09-27 DIAGNOSIS — Z01818 Encounter for other preprocedural examination: Secondary | ICD-10-CM | POA: Diagnosis not present

## 2023-09-27 DIAGNOSIS — I340 Nonrheumatic mitral (valve) insufficiency: Secondary | ICD-10-CM | POA: Diagnosis not present

## 2023-09-27 DIAGNOSIS — I10 Essential (primary) hypertension: Secondary | ICD-10-CM

## 2023-09-27 NOTE — Patient Instructions (Signed)
Medication Instructions:   Continue current medication regimen.   Lab Work:  CBC and BMET within 1-2 weeks of your procedure  If you have labs (blood work) drawn today and your tests are completely normal, you will receive your results only by: MyChart Message (if you have MyChart) OR A paper copy in the mail If you have any lab test that is abnormal or we need to change your treatment, we will call you to review the results.   Testing/Procedures:  Our office will contact you to arrange for a Transesophageal Echocardiogram.    Follow-Up: At [facility], you and your health needs are our priority.  As part of our continuing mission to provide you with exceptional heart care, we have created designated Provider Care Teams.  These Care Teams include your primary Cardiologist (physician) and Advanced Practice Providers (APPs -  Physician Assistants and Nurse Practitioners) who all work together to provide you with the care you need, when you need it.  We recommend signing up for the patient portal called "MyChart".  Sign up information is provided on this After Visit Summary.  MyChart is used to connect with patients for Virtual Visits (Telemedicine).  Patients are able to view lab/test results, encounter notes, upcoming appointments, etc.  Non-urgent messages can be sent to your provider as well.   To learn more about what you can do with MyChart, go to [email or web address].    Your next appointment:   3-4 months  Provider:   You may see Dietrich Pates, MD or one of the following Advanced Practice Providers on your designated Care Team:   [place], PA-C  Jacolyn Reedy, [place]

## 2023-09-27 NOTE — Progress Notes (Signed)
Cardiology Office Note    Date:  09/27/2023  ID:  [name], DOB 1946/11/23, MRN [id number] Cardiologist: Dietrich Pates, MD    History of Present Illness:    [name] is a 77 y.o. female with past medical history of mitral valve regurgitation, HTN and history of thyroid cancer who presents to the office today to discuss a transesophageal echocardiogram.  She was examined by Dr. Tenny Craw in 04/2023 and prior transthoracic echocardiogram in 10/2022 had shown a mobile echodensity attached possibly to the anterior mitral valve leaflet and was also noted to have moderate eccentric anteriorly directed mitral valve regurgitation. A TEE had initially been recommended and this was attempted in 11/2022 but unsuccessful due to inability to advance the probe. At the time of her visit, it was recommended that she keep follow-up with GI for further evaluation of her esophagus and TEE could be performed afterwards.  She did have an EGD in 07/2023 which showed no obvious stricture in the oropharynx but was slightly torturous. 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 Was cleared to proceed with TEE.   In talking with the patient today, she reports overall feeling well since her last office visit. She remains active at baseline and denies any recent chest pain or dyspnea on exertion with routine activities.  Says that she does not exercise routinely. Denies any recent dizziness, presyncope, orthopnea, PND or pitting edema. She is volunteering at a local early-voting site and requests to postpone her TEE until after election day.   Studies Reviewed:   EKG: EKG is ordered today and demonstrates:   EKG Interpretation Date/Time:  Wednesday September 27 2023 14:24:15 EDT Ventricular Rate:  61 PR Interval:  164 QRS Duration:  98 QT Interval:  434 QTC Calculation: 436 R Axis:   78  Text Interpretation: Sinus rhythm with  Premature atrial complexes When compared with ECG of 27-Oct-2022 10:53, No significant change was found Confirmed by Randall An ([id number]) on 09/27/2023 2:34:29 PM       Echocardiogram: 10/2022 IMPRESSIONS     1. Left ventricular ejection fraction, by estimation, is 60 to 65%. The  left ventricle has normal function. The left ventricle has no regional  wall motion abnormalities. Left ventricular diastolic parameters are  consistent with Grade II diastolic  dysfunction (pseudonormalization). The average left ventricular global  longitudinal strain is -29.1 %.   2. Right ventricular systolic function is mildly reduced. The right  ventricular size is normal.   3. Left atrial size was severely dilated.   4. Mitral valve leaflets are not adequately visualized. There appears to  be a mobile echodensity 0.59 x 0.89 cm attached possibly to the anterior  mitral valve leaflet by a stalk and is noted on the atrial side during  systole. Differential diagnoses  include chordal rupture, flail mitral valve leaflet, tumor, thrombus,  vegetation. Clinical correlation is recommended. Recommend TEE to further  evaluate the mitral valve pathology. Moderate eccentric anteriorly  directed mitral valve regurgitation. No  evidence of mitral stenosis.   5. The aortic valve was not well visualized. Aortic valve regurgitation  is not visualized. No aortic stenosis is present.   6. The inferior vena cava is normal in size with greater than 50%  respiratory variability, suggesting right atrial pressure of 3 mmHg.   Comparison(s): No prior Echocardiogram.     Physical Exam:   VS:  BP 116/70   Pulse  61   Ht 5\' 7"  (1.702 m)   Wt 173 lb 6.4 oz (78.7 kg)   BMI 27.16 kg/m    Wt Readings from Last 3 Encounters:  09/27/23 173 lb 6.4 oz (78.7 kg)  09/19/23 168 lb 8 oz (76.4 kg)  09/06/23 171 lb 3.2 oz (77.7 kg)     GEN: Well nourished, well developed female appearing in no acute distress NECK: No  JVD; No carotid bruits CARDIAC: RRR, 3/6 systolic murmur along Apex and sternal border.  RESPIRATORY:  Clear to auscultation without rales, wheezing or rhonchi  ABDOMEN: Appears non-distended. No obvious abdominal masses. EXTREMITIES: No clubbing or cyanosis. No pitting edema.  Distal pedal pulses are 2+ bilaterally.   Assessment and Plan:   1. Mitral Valve Regurgitation - We reviewed indications for a TEE based off imaging from last year and she is in agreement with proceeding with this. Has been cleared by GI as outlined above. Based off results, she may require referral to the Structural Heart Team or CT Surgery.   Shared Decision Making/Informed Consent{  The risks [esophageal damage, perforation (1:10,000 risk), bleeding, pharyngeal hematoma as well as other potential complications associated with conscious sedation including aspiration, arrhythmia, respiratory failure and death], benefits (treatment guidance and diagnostic support) and alternatives of a transesophageal echocardiogram were discussed in detail with Ms. Protheroe and she is willing to proceed.      2. HTN - Blood pressure is well-controlled at 116/70 during today's visit. Continue current medical therapy with Enalapril 10 mg daily.  Signed, Ellsworth Lennox, PA-C

## 2023-09-27 NOTE — H&P (View-Only) (Signed)
 Cardiology Office Note    Date:  09/27/2023  ID:  [name], DOB 1946/11/23, MRN [id number] Cardiologist: Dietrich Pates, MD    History of Present Illness:    [name] is a 77 y.o. female with past medical history of mitral valve regurgitation, HTN and history of thyroid cancer who presents to the office today to discuss a transesophageal echocardiogram.  She was examined by Dr. Tenny Craw in 04/2023 and prior transthoracic echocardiogram in 10/2022 had shown a mobile echodensity attached possibly to the anterior mitral valve leaflet and was also noted to have moderate eccentric anteriorly directed mitral valve regurgitation. A TEE had initially been recommended and this was attempted in 11/2022 but unsuccessful due to inability to advance the probe. At the time of her visit, it was recommended that she keep follow-up with GI for further evaluation of her esophagus and TEE could be performed afterwards.  She did have an EGD in 07/2023 which showed no obvious stricture in the oropharynx but was slightly torturous. 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 Was cleared to proceed with TEE.   In talking with the patient today, she reports overall feeling well since her last office visit. She remains active at baseline and denies any recent chest pain or dyspnea on exertion with routine activities.  Says that she does not exercise routinely. Denies any recent dizziness, presyncope, orthopnea, PND or pitting edema. She is volunteering at a local early-voting site and requests to postpone her TEE until after election day.   Studies Reviewed:   EKG: EKG is ordered today and demonstrates:   EKG Interpretation Date/Time:  Wednesday September 27 2023 14:24:15 EDT Ventricular Rate:  61 PR Interval:  164 QRS Duration:  98 QT Interval:  434 QTC Calculation: 436 R Axis:   78  Text Interpretation: Sinus rhythm with  Premature atrial complexes When compared with ECG of 27-Oct-2022 10:53, No significant change was found Confirmed by Randall An ([id number]) on 09/27/2023 2:34:29 PM       Echocardiogram: 10/2022 IMPRESSIONS     1. Left ventricular ejection fraction, by estimation, is 60 to 65%. The  left ventricle has normal function. The left ventricle has no regional  wall motion abnormalities. Left ventricular diastolic parameters are  consistent with Grade II diastolic  dysfunction (pseudonormalization). The average left ventricular global  longitudinal strain is -29.1 %.   2. Right ventricular systolic function is mildly reduced. The right  ventricular size is normal.   3. Left atrial size was severely dilated.   4. Mitral valve leaflets are not adequately visualized. There appears to  be a mobile echodensity 0.59 x 0.89 cm attached possibly to the anterior  mitral valve leaflet by a stalk and is noted on the atrial side during  systole. Differential diagnoses  include chordal rupture, flail mitral valve leaflet, tumor, thrombus,  vegetation. Clinical correlation is recommended. Recommend TEE to further  evaluate the mitral valve pathology. Moderate eccentric anteriorly  directed mitral valve regurgitation. No  evidence of mitral stenosis.   5. The aortic valve was not well visualized. Aortic valve regurgitation  is not visualized. No aortic stenosis is present.   6. The inferior vena cava is normal in size with greater than 50%  respiratory variability, suggesting right atrial pressure of 3 mmHg.   Comparison(s): No prior Echocardiogram.     Physical Exam:   VS:  BP 116/70   Pulse  61   Ht 5\' 7"  (1.702 m)   Wt 173 lb 6.4 oz (78.7 kg)   BMI 27.16 kg/m    Wt Readings from Last 3 Encounters:  09/27/23 173 lb 6.4 oz (78.7 kg)  09/19/23 168 lb 8 oz (76.4 kg)  09/06/23 171 lb 3.2 oz (77.7 kg)     GEN: Well nourished, well developed female appearing in no acute distress NECK: No  JVD; No carotid bruits CARDIAC: RRR, 3/6 systolic murmur along Apex and sternal border.  RESPIRATORY:  Clear to auscultation without rales, wheezing or rhonchi  ABDOMEN: Appears non-distended. No obvious abdominal masses. EXTREMITIES: No clubbing or cyanosis. No pitting edema.  Distal pedal pulses are 2+ bilaterally.   Assessment and Plan:   1. Mitral Valve Regurgitation - We reviewed indications for a TEE based off imaging from last year and she is in agreement with proceeding with this. Has been cleared by GI as outlined above. Based off results, she may require referral to the Structural Heart Team or CT Surgery.   Shared Decision Making/Informed Consent{  The risks [esophageal damage, perforation (1:10,000 risk), bleeding, pharyngeal hematoma as well as other potential complications associated with conscious sedation including aspiration, arrhythmia, respiratory failure and death], benefits (treatment guidance and diagnostic support) and alternatives of a transesophageal echocardiogram were discussed in detail with Ms. Protheroe and she is willing to proceed.      2. HTN - Blood pressure is well-controlled at 116/70 during today's visit. Continue current medical therapy with Enalapril 10 mg daily.  Signed, Ellsworth Lennox, PA-C

## 2023-10-03 DIAGNOSIS — I340 Nonrheumatic mitral (valve) insufficiency: Secondary | ICD-10-CM | POA: Diagnosis not present

## 2023-10-03 DIAGNOSIS — Z01818 Encounter for other preprocedural examination: Secondary | ICD-10-CM | POA: Diagnosis not present

## 2023-10-04 LAB — BASIC METABOLIC PANEL
BUN/Creatinine Ratio: 25 (ref 12–28)
BUN: 20 mg/dL (ref 8–27)
CO2: 19 mmol/L — ABNORMAL LOW (ref 20–29)
Calcium: 9 mg/dL (ref 8.7–10.3)
Chloride: 107 mmol/L — ABNORMAL HIGH (ref 96–106)
Creatinine, Ser: 0.8 mg/dL (ref 0.57–1.00)
Glucose: 77 mg/dL (ref 70–99)
Potassium: 4 mmol/L (ref 3.5–5.2)
Sodium: 139 mmol/L (ref 134–144)
eGFR: 75 mL/min/{1.73_m2} (ref >59)

## 2023-10-04 LAB — CBC
Hematocrit: 37.9 % (ref 34.0–46.6)
Hemoglobin: 12.5 g/dL (ref 11.1–15.9)
MCH: 31.3 pg (ref 26.6–33.0)
MCHC: 33 g/dL (ref 31.5–35.7)
MCV: 95 fL (ref 79–97)
Platelets: 192 10*3/uL (ref 150–450)
RBC: 3.99 x10E6/uL (ref 3.77–5.28)
RDW: 13 % (ref 11.7–15.4)
WBC: 5.7 10*3/uL (ref 3.4–10.8)

## 2023-10-24 ENCOUNTER — Telehealth: Payer: Self-pay | Admitting: Student

## 2023-10-24 ENCOUNTER — Telehealth: Payer: Self-pay | Admitting: *Deleted

## 2023-10-24 NOTE — Telephone Encounter (Signed)
   The patient was initially scheduled for a TEE with Dr. Diona Browner at [facility] on 10/27/2023.  Received a message from him today requesting for the procedure to be scheduled in [place] due to difficulty passing the probe in the past despite the use of a glide scope as there would be more options available at [facility], including a smaller diameter TEE probe. Nursing staff reviewed this with the patient and her TEE has been scheduled for 10/27/2023 at [facility] with Dr. Eden Emms. I did call Short Stay at [facility] and left a voicemail to make them aware of this change.  Signed, Ellsworth Lennox, PA-C 10/24/2023, 4:47 PM

## 2023-10-24 NOTE — Telephone Encounter (Signed)
Pt notified that TEE has been switched to [facility] in [place] at 9:30 am ( Case number [id number]). Pt voiced understanding.

## 2023-10-25 ENCOUNTER — Ambulatory Visit: Payer: Medicare HMO | Admitting: Internal Medicine

## 2023-10-25 ENCOUNTER — Encounter (HOSPITAL_COMMUNITY): Admission: RE | Admit: 2023-10-25 | Payer: Medicare HMO | Source: Ambulatory Visit

## 2023-10-26 NOTE — Progress Notes (Signed)
Spoke to pt and instructed them to come at 0730 and to be NPO after 0000.   Confirmed that pt will have a ride home and someone to stay with them for 24 hours after the procedure.

## 2023-10-26 NOTE — Anesthesia Preprocedure Evaluation (Addendum)
Anesthesia Evaluation  Patient identified by MRN, date of birth, ID band Patient awake    Reviewed: Allergy & Precautions, H&P , NPO status , Patient's Chart, lab work & pertinent test results  History of Anesthesia Complications (+) PONV, PROLONGED EMERGENCE, Family history of anesthesia reaction and history of anesthetic complications  Airway Mallampati: II  TM Distance: >3 FB Neck ROM: Full    Dental  (+) Dental Advisory Given   Pulmonary neg pulmonary ROS   Pulmonary exam normal breath sounds clear to auscultation       Cardiovascular hypertension, Pt. on medications + Valvular Problems/Murmurs MR  Rhythm:Regular Rate:Normal + Systolic murmurs Echo 10/2022  1. Left ventricular ejection fraction, by estimation, is 60 to 65%. The left ventricle has normal function. The left ventricle has no regional wall motion abnormalities. Left ventricular diastolic parameters are consistent with Grade II diastolic dysfunction (pseudonormalization). The average left ventricular global longitudinal strain is -29.1%.   2. Right ventricular systolic function is mildly reduced. The right ventricular size is normal.   3. Left atrial size was severely dilated.   4. Mitral valve leaflets are not adequately visualized. There appears to be a mobile echodensity 0.59 x 0.89 cm attached possibly to the anterior mitral valve leaflet by a stalk and is noted on the atrial side during systole. Differential diagnoses include chordal rupture, flail mitral valve leaflet, tumor, thrombus, vegetation. Clinical correlation is recommended. Recommend TEE to further evaluate the mitral valve pathology. Moderate eccentric anteriorly directed mitral valve regurgitation. No  evidence of mitral stenosis.   5. The aortic valve was not well visualized. Aortic valve regurgitation  is not visualized. No aortic stenosis is present.   6. The inferior vena cava is normal in size with  greater than 50%  respiratory variability, suggesting right atrial pressure of 3 mmHg.   Comparison(s): No prior Echocardiogram.    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 Urge incontinence Vitamin D deficiency    Reproductive/Obstetrics                             Anesthesia Physical Anesthesia Plan  ASA: 3  Anesthesia Plan: MAC   Post-op Pain Management: Minimal or no pain anticipated   Induction: Intravenous  PONV Risk Score and Plan: 1 and Propofol infusion, TIVA and Treatment may vary due to age or medical condition  Airway Management Planned: Nasal Cannula and Natural Airway  Additional Equipment:   Intra-op Plan:   Post-operative Plan:   Informed Consent: I have reviewed the patients History and Physical, chart, labs and discussed the procedure including the risks, benefits and alternatives for the proposed anesthesia with the patient or authorized representative who has indicated his/her understanding and acceptance.     Dental advisory given  Plan Discussed with: CRNA  Anesthesia Plan Comments:        Anesthesia Quick Evaluation

## 2023-10-27 ENCOUNTER — Telehealth: Payer: Self-pay

## 2023-10-27 ENCOUNTER — Ambulatory Visit (HOSPITAL_BASED_OUTPATIENT_CLINIC_OR_DEPARTMENT_OTHER): Payer: Medicare HMO | Admitting: Anesthesiology

## 2023-10-27 ENCOUNTER — Encounter (HOSPITAL_COMMUNITY): Payer: Self-pay | Admitting: Cardiovascular Disease

## 2023-10-27 ENCOUNTER — Encounter (HOSPITAL_COMMUNITY)
Admission: RE | Disposition: A | Discharge status: Home / Self Care | Payer: Self-pay | Source: Home / Self Care | Attending: Cardiovascular Disease

## 2023-10-27 ENCOUNTER — Ambulatory Visit (HOSPITAL_COMMUNITY)
Admission: RE | Admit: 2023-10-27 | Discharge: 2023-10-27 | Disposition: A | Discharge status: Home / Self Care | Payer: Medicare HMO | Attending: Cardiovascular Disease | Admitting: Cardiovascular Disease

## 2023-10-27 ENCOUNTER — Ambulatory Visit (HOSPITAL_BASED_OUTPATIENT_CLINIC_OR_DEPARTMENT_OTHER)
Admission: RE | Admit: 2023-10-27 | Discharge: 2023-10-27 | Disposition: A | Discharge status: Home / Self Care | Payer: Medicare HMO | Source: Ambulatory Visit | Attending: Student | Admitting: Cardiovascular Disease

## 2023-10-27 ENCOUNTER — Ambulatory Visit (HOSPITAL_COMMUNITY): Payer: Medicare HMO | Admitting: Anesthesiology

## 2023-10-27 ENCOUNTER — Other Ambulatory Visit: Payer: Self-pay

## 2023-10-27 DIAGNOSIS — E039 Hypothyroidism, unspecified: Secondary | ICD-10-CM | POA: Diagnosis not present

## 2023-10-27 DIAGNOSIS — I10 Essential (primary) hypertension: Secondary | ICD-10-CM | POA: Diagnosis not present

## 2023-10-27 DIAGNOSIS — I340 Nonrheumatic mitral (valve) insufficiency: Secondary | ICD-10-CM | POA: Insufficient documentation

## 2023-10-27 DIAGNOSIS — Z85850 Personal history of malignant neoplasm of thyroid: Secondary | ICD-10-CM | POA: Insufficient documentation

## 2023-10-27 DIAGNOSIS — Z79899 Other long term (current) drug therapy: Secondary | ICD-10-CM | POA: Diagnosis not present

## 2023-10-27 DIAGNOSIS — E785 Hyperlipidemia, unspecified: Secondary | ICD-10-CM | POA: Diagnosis not present

## 2023-10-27 HISTORY — PX: TRANSESOPHAGEAL ECHOCARDIOGRAM (CATH LAB): EP1270

## 2023-10-27 LAB — ECHO TEE
MV M vel: 4.13 m/s
MV Peak grad: 68.2 mm[Hg]
Radius: 1.5 cm

## 2023-10-27 SURGERY — TRANSESOPHAGEAL ECHOCARDIOGRAM (TEE)
Anesthesia: Monitor Anesthesia Care

## 2023-10-27 SURGERY — TRANSESOPHAGEAL ECHOCARDIOGRAM (TEE) (CATHLAB)
Anesthesia: Monitor Anesthesia Care

## 2023-10-27 MED ORDER — SODIUM CHLORIDE 0.9 % IV SOLN
INTRAVENOUS | Status: DC | PRN
Start: 2023-10-27 — End: 2023-10-27

## 2023-10-27 MED ORDER — PROPOFOL 500 MG/50ML IV EMUL
INTRAVENOUS | Status: DC | PRN
  Administered 2023-10-27: 100 ug/kg/min via INTRAVENOUS

## 2023-10-27 MED ORDER — SODIUM CHLORIDE 0.9% FLUSH: 10.0000 mL | Freq: Two times a day (BID) | INTRAVENOUS | Status: DC

## 2023-10-27 MED ORDER — LIDOCAINE 2% (20 MG/ML) 5 ML SYRINGE
INTRAMUSCULAR | Status: DC | PRN
  Administered 2023-10-27: 80 mg via INTRAVENOUS

## 2023-10-27 MED ORDER — PHENYLEPHRINE 80 MCG/ML (10ML) SYRINGE FOR IV PUSH (FOR BLOOD PRESSURE SUPPORT)
PREFILLED_SYRINGE | INTRAVENOUS | Status: DC | PRN
  Administered 2023-10-27: 160 ug via INTRAVENOUS
  Administered 2023-10-27: 80 ug via INTRAVENOUS

## 2023-10-27 MED ORDER — PROPOFOL 10 MG/ML IV BOLUS
INTRAVENOUS | Status: DC | PRN
  Administered 2023-10-27: 70 mg via INTRAVENOUS

## 2023-10-27 NOTE — Telephone Encounter (Signed)
Per PASCAL review of 10/27/2023 TEE:

## 2023-10-27 NOTE — Interval H&P Note (Signed)
History and Physical Interval Note:  10/27/2023 7:32 AM  [name]  has presented today for surgery, with the diagnosis of mitral valve regurgatation.  The various methods of treatment have been discussed with the patient and family. After consideration of risks, benefits and other options for treatment, the patient has consented to  Procedure(s): TRANSESOPHAGEAL ECHOCARDIOGRAM (N/A) as a surgical intervention.  The patient's history has been reviewed, patient examined, no change in status, [name] for surgery.  I have reviewed the patient's chart and labs.  Questions were answered to the patient's satisfaction.     Charlton Haws

## 2023-10-27 NOTE — Anesthesia Procedure Notes (Signed)
Procedure Name: MAC Date/Time: 10/27/2023 9:01 AM  Performed by: Debbe Odea, CRNAPre-anesthesia Checklist: Patient identified, Emergency Drugs available, Suction available, Patient being monitored and Timeout performed Patient Re-evaluated:Patient Re-evaluated prior to induction Oxygen Delivery Method: Nasal cannula Preoxygenation: Pre-oxygenation with 100% oxygen Induction Type: IV induction

## 2023-10-27 NOTE — Anesthesia Postprocedure Evaluation (Signed)
Anesthesia Post Note  Patient: [name]  Procedure(s) Performed: TRANSESOPHAGEAL ECHOCARDIOGRAM     Patient location during evaluation: Cath Lab Anesthesia Type: MAC Level of consciousness: awake and alert Pain management: pain level controlled Vital Signs Assessment: post-procedure vital signs reviewed and stable Respiratory status: spontaneous breathing Cardiovascular status: stable Anesthetic complications: no   No notable events documented.  Last Vitals:  Vitals:   10/27/23 0925 10/27/23 0930  BP: 105/63 (!) 111/95  Pulse: (!) 57 (!) 54  Resp: 17 17  Temp: 36.7 C   SpO2: 98% 96%    Last Pain:  Vitals:   10/27/23 0925  TempSrc: Temporal  PainSc: 0-No pain                 Lewie Loron

## 2023-10-27 NOTE — CV Procedure (Signed)
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

## 2023-10-27 NOTE — Transfer of Care (Signed)
Immediate Anesthesia Transfer of Care Note  Patient: [name]  Procedure(s) Performed: TRANSESOPHAGEAL ECHOCARDIOGRAM  Patient Location: PACU and Cath Lab  Anesthesia Type:MAC  Level of Consciousness: awake and sedated  Airway & Oxygen Therapy: Patient Spontanous Breathing and Patient connected to nasal cannula oxygen  Post-op Assessment: Report given to RN and Post -op Vital signs reviewed and stable  Post vital signs: Reviewed and stable  Last Vitals:  Vitals Value Taken Time  BP    Temp    Pulse    Resp    SpO2      Last Pain:  Vitals:   10/27/23 0750  TempSrc:   PainSc: 0-No pain         Complications: No notable events documented.

## 2023-10-30 ENCOUNTER — Encounter: Payer: Self-pay | Admitting: Internal Medicine

## 2023-10-30 ENCOUNTER — Telehealth: Payer: Self-pay

## 2023-10-30 ENCOUNTER — Ambulatory Visit: Payer: Medicare HMO | Attending: Internal Medicine | Admitting: Internal Medicine

## 2023-10-30 VITALS — BP 142/80 | HR 58 | Ht 67.0 in | Wt 167.4 lb

## 2023-10-30 DIAGNOSIS — I340 Nonrheumatic mitral (valve) insufficiency: Secondary | ICD-10-CM | POA: Diagnosis not present

## 2023-10-30 DIAGNOSIS — Z01812 Encounter for preprocedural laboratory examination: Secondary | ICD-10-CM

## 2023-10-30 DIAGNOSIS — N182 Chronic kidney disease, stage 2 (mild): Secondary | ICD-10-CM

## 2023-10-30 DIAGNOSIS — I10 Essential (primary) hypertension: Secondary | ICD-10-CM

## 2023-10-30 NOTE — Telephone Encounter (Signed)
"  For patient MW: This is Primary MR   This valve appears to suitable for a MitraClip implant. In the Bicaval and SAXB views, the fossa looks reasonable for a transseptal puncture. LA dimensions are large enough for device steering and straddle. 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 The posterior leaflet is 14 mm in the 131 LVOT grasping view. Gradient measures 2 mmHg (63 BPM); MVA measures 6 cm2 by Transgastric. Based on this information, I'd recommend starting with an NTW or XTW at A3/P3 and assess for gradient."

## 2023-10-30 NOTE — H&P (View-Only) (Signed)
 Patient ID: [name] MRN: [id number] DOB/AGE: 1946-10-22 77 y.o.  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 female with the indicated medical history here for recommendations regarding her severe degenerative mitral regurgitation.  She had been seen previously.  She was noted to possess an incidental murmur.  She was referred for a TTE in 2023 which demonstrated possibly significant mitral regurgitation.  She is referred for a TEE in 2023 but this was unsuccessful.  She underwent EGD which cleared her for a TEE.  This was performed recently which demonstrated severe degenerative mitral regurgitation.  This demonstrated the findings as detailed above.  She does not have a history of atrial fibrillation.  The patient is limited by hip and knee issues.  She does not walk all that much.  However compared to a few years ago she has definitely noticed a change.  She will get short of breath when she goes upstairs.  She does feel like she is slowing down.  She is not short of breath at rest.  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  She walks maybe a mile and a half but typically slope pace with many stops in her working day.  She denies any heart failure symptoms, presyncope or syncope.  She does bruise easily but has not had any severe bleeding.  She denies any Marina Goodell says nocturnal dyspnea or orthopnea.  She had been hospitalized at [facility] in 2018 due to nausea and chest discomfort.  Her evaluation there was negative.  This was thought to be due to a GI issue.  She had an echocardiogram at that time which showed only mild  mitral regurgitation.  She remembers being quite sick during COVID.  She is not sure if she exactly contracted COVID.  She does not remember becoming acutely short of breath during that time.  She has not been hospitalized for acute heart failure.  She does not smoke.  She sees a Education officer, community on a regular basis.  She reports good dental health.  Past Medical History:  Diagnosis Date   Arthritis    Cancer [facility])    Thyroid cancer   COVID-19 virus infection 12/2020   Family history of adverse reaction to anesthesia    mother "couldn't wake up good" and would throw up   GERD (gastroesophageal reflux disease)    Heart murmur    History of kidney stones    Hypercholesteremia    resolved after gastric bypass   Hypertension    Hypothyroidism     Past Surgical History:  Procedure Laterality Date   ABDOMINAL HYSTERECTOMY     BALLOON DILATION N/A 07/17/2023   Procedure: BALLOON DILATION;  Surgeon: Lanelle Bal, DO;  Location: [facility] ENDO SUITE;  Service: Endoscopy;  Laterality: N/A;   BILATERAL SALPINGOOPHORECTOMY     BREAST EXCISIONAL BIOPSY Bilateral 1995   benign   CHOLECYSTECTOMY     COLONOSCOPY  11/14/2011   Procedure: COLONOSCOPY;  Surgeon: Corbin Ade, MD;  Location: [facility];  Service: Endoscopy;  Laterality: N/A;   ESOPHAGOGASTRODUODENOSCOPY (EGD) WITH PROPOFOL N/A 07/17/2023   Procedure: ESOPHAGOGASTRODUODENOSCOPY (EGD) WITH L;  Surgeon: Lanelle Bal, DO;  Location: [facility];  Service: Endoscopy;  Laterality: N/A;   REPLACEMENT TOTAL KNEE BILATERAL     ROUX-EN-Y PROCEDURE  2007   TEE WITHOUT CARDIOVERSION N/A 11/11/2022   Procedure: TRANSESOPHAGEAL ECHOCARDIOGRAM (TEE);  Surgeon: Marjo Bicker, MD;  Location: [facility] ORS;  Service: Cardiovascular;  Laterality: N/A;   TONSILLECTOMY     TOTAL HIP ARTHROPLASTY Right 12/23/2019   Procedure: RIGHT TOTAL HIP ARTHROPLASTY ANTERIOR APPROACH;  Surgeon: Tarry Kos, MD;  Location: [facility] OR;  Service: Orthopedics;  Laterality:  Right;   TOTAL HIP ARTHROPLASTY Left 11/21/2022   Procedure: LEFT TOTAL HIP ARTHROPLASTY ANTERIOR APPROACH;  Surgeon: Tarry Kos, MD;  Location: [facility] OR;  Service: Orthopedics;  Laterality: Left;  3-C   TOTAL THYROIDECTOMY  12/2010   TRANSESOPHAGEAL ECHOCARDIOGRAM (CATH LAB) N/A 10/27/2023   Procedure: TRANSESOPHAGEAL ECHOCARDIOGRAM;  Surgeon: Wendall Stade, MD;  [facility];  Service: Cardiovascular;  Laterality: N/A;   TUBAL LIGATION      Family History  Problem Relation Age of Onset   Breast cancer Mother    Pneumonia Mother 53   CAD Father 70   Breast cancer Sister    CAD Sister 80   Other Maternal Grandmother 52   CAD Brother 36   Other Brother 62       drug overdose   CAD Other 32   Colon cancer Neg Hx     Social History   Socioeconomic History   Marital status: Widowed    Spouse name: Not on file   Number of children: Not on file   Years of education: Not on file   Highest education level: Some college, no degree  Occupational History   Occupation: Retired Clinical biochemist  Tobacco Use   Smoking status: Never   Smokeless tobacco: Never  Vaping Use   Vaping status: Never Used  Substance and Sexual Activity   Alcohol use: No   Drug use: No   Sexual activity: Not on file  Other Topics Concern   Not on file  Social History Narrative   Not on file   Social Determinants of Health   Financial Resource Strain: Low Risk  (09/06/2023)   Overall Financial Resource Strain (CARDIA)    Difficulty of Paying Living Expenses: Not very hard  Food Insecurity: No Food Insecurity (09/06/2023)   Hunger Vital Sign    Worried About Running Out of Food in the Last Year: Never true    Ran Out of Food in the Last Year: Never true  Transportation Needs: No Transportation Needs (09/06/2023)   PRAPARE - Administrator, Civil Service (Medical): No    Lack of Transportation (Non-Medical): No  Physical Activity: Unknown (09/06/2023)   Exercise Vital Sign     Days of Exercise per Week: 4 days    Minutes of Exercise per Session: Not on file  Stress: No Stress Concern Present (09/06/2023)   [employer] of Occupational Health - Occupational Stress Questionnaire    Feeling of Stress : Not at all  Social Connections: Moderately Integrated (09/06/2023)   Social Connection and Isolation Panel [NHANES]    Frequency of Communication with Friends and Family: More than three times a week    Frequency of Social Gatherings with Friends and Family: Twice a week    Attends Religious Services: More than 4 times per year    Active Member of [employer] or Organizations: Yes    Attends Banker Meetings: More than 4 times per  year    Marital Status: Widowed  Intimate Partner Violence: Not At Risk (11/21/2022)   Humiliation, Afraid, Rape, and Kick questionnaire    Fear of Current or Ex-Partner: No    Emotionally Abused: No    Physically Abused: No    Sexually Abused: No     Prior to Admission medications   Medication Sig Start Date End Date Taking? Authorizing Provider  amoxicillin (AMOXIL) 500 MG capsule Take 2,000 mg by mouth See admin instructions. Take 4 capsules (2000 mg) by mouth 1 hour prior to dental appointments    [provider]  Biotin 5000 MCG CAPS Take 5,000 mcg by mouth daily.    [provider]  Calcium Citrate-Vitamin D (CALCIUM CITRATE+D3 PO) Take 1 tablet by mouth daily.    [provider]  carboxymethylcellulose (REFRESH PLUS) 0.5 % SOLN Place 1 drop into both eyes in the morning and at bedtime.    [provider]  Cholecalciferol (VITAMIN D3 ULTRA STRENGTH) 125 MCG (5000 UT) capsule Take 5,000 Units by mouth daily.    [provider]  cyanocobalamin 2000 MCG tablet Take 2,000 mcg by mouth daily.    [provider]  enalapril (VASOTEC) 10 MG tablet Take 1 tablet (10 mg total) by mouth daily. Patient taking differently: Take 10 mg by mouth at bedtime. 09/19/23   Tommie Sams,  DO  famotidine (PEPCID) 40 MG tablet Take 1 tablet (40 mg total) by mouth daily. 09/19/23   Tommie Sams, DO  ferrous sulfate 325 (65 FE) MG tablet Take 325 mg by mouth daily.    [provider]  levothyroxine (SYNTHROID) 150 MCG tablet Take 1 tablet (150 mcg total) by mouth daily before breakfast. Patient taking differently: Take 150 mcg by mouth See admin instructions. Take 150 mg daily except skip dose on Sundays 12/29/16   Maxie Barb, MD  Magnesium 250 MG CAPS Take 250 mg by mouth daily.    [provider]  Multiple Vitamin (MULTIVITAMIN WITH MINERALS) TABS tablet Take 1 tablet by mouth in the morning and at bedtime.    [provider]  valACYclovir (VALTREX) 1000 MG tablet TAKE 2 TABLETS AT FIRST SIGN OF FEVER BLISTER, THEN 2 TABLETS 2 HOURSLATER Patient taking differently: Take 2,000 mg by mouth 2 (two) times daily as needed (fever blisters.). 05/11/22   Tommie Sams, DO  Vibegron (GEMTESA) 75 MG TABS Take 75 mg by mouth daily. 08/07/23   [provider]  Zinc 50 MG TABS Take 50 mg by mouth daily.    [provider]    Allergies  Allergen Reactions   Codeine Nausea And Vomiting   Demerol Nausea And Vomiting   Nsaids Other (See Comments)    Due to gastric bypass    REVIEW OF SYSTEMS:  General: no fevers/chills/night sweats Eyes: no blurry vision, diplopia, or amaurosis ENT: no sore throat or hearing loss Resp: no cough, wheezing, or hemoptysis CV: no edema or palpitations GI: no abdominal pain, nausea, vomiting, diarrhea, or constipation GU: no dysuria, frequency, or hematuria Skin: no rash Neuro: no headache, numbness, tingling, or weakness of extremities Musculoskeletal: no joint pain or swelling Heme: no bleeding, DVT, or easy bruising Endo: no polydipsia or polyuria  BP (!) 142/80   Pulse (!) 58   Ht 5\' 7"  (1.702 m)   Wt 167 lb 6.4 oz (75.9 kg)   SpO2 99%   BMI 26.22 kg/m   PHYSICAL EXAM: GEN:  AO x 3 in no  acute distress HEENT: normal Dentition: Some missing teeth Neck: JVP normal. +2 carotid upstrokes without bruits. No thyromegaly. Lungs: equal expansion, clear bilaterally CV: Apex is discrete and nondisplaced, RRR with 4/6 holosystolic murmur throughout precordium Abd: soft, non-tender, non-distended; no bruit; positive bowel sounds Ext: no edema, ecchymoses, or cyanosis Vascular: 2+ femoral pulses, 2+ radial pulses       Skin: warm and dry without rash Neuro: CN II-XII grossly intact; motor and sensory grossly intact    DATA AND STUDIES:  EKG: EKG performed October 2024 demonstrates sinus rhythm  EKG Interpretation Date/Time:  Monday October 30 2023 09:24:56 EST Ventricular Rate:  58 PR Interval:  162 QRS Duration:  100 QT Interval:  440 QTC Calculation: 431 R Axis:   52  Text Interpretation: Sinus bradycardia Nonspecific ST abnormality When compared with ECG of 27-Sep-2023 14:24, Premature atrial complexes are no longer Present Confirmed by Alverda Skeans ([id number]) on 10/30/2023 9:26:40 AM        Cardiac Studies & Procedures     STRESS TESTS  NM MYOCAR MULTI W/SPECT W 12/29/2016  Narrative  There was no ST segment deviation noted during stress.  The study is normal. Inferior defect most consistent with subdiaphragmatic attenuation and gut radiotracer uptake artifact as opposed to true inferior wall ischemia.  This is a low risk study.  The left ventricular ejection fraction is normal (55-65%).   ECHOCARDIOGRAM  ECHOCARDIOGRAM COMPLETE 11/02/2022  Narrative ECHOCARDIOGRAM REPORT    Patient Name:   SHARVON RADICH Date of Exam: 11/02/2022 Medical Rec #:  [id number]         Height:       67.0 in Accession #:    [id number]        Weight:       172.8 lb Date of Birth:  December 03, 1946         BSA:          1.900 m Patient Age:    76 years          BP:           138/74 mmHg Patient Gender: F                 HR:           58 bpm. Exam Location:  [place]  Procedure:  2D Echo, Color Doppler, Cardiac Doppler and Strain Analysis  Indications:    R01.1 Murmur  History:        Patient has prior history of Echocardiogram examinations, most recent 12/28/2016. Signs/Symptoms:Murmur; Risk Factors:Hypertension, Dyslipidemia and Non-Smoker.  Sonographer:    Jake Seats RDMS, RVT, RDCS Referring Phys: [id number] JAYCE G COOK  IMPRESSIONS   1. Left ventricular ejection fraction, by estimation, is 60 to 65%. The left ventricle has normal function. The left ventricle has no regional wall motion abnormalities. Left ventricular diastolic parameters are consistent with Grade II diastolic dysfunction (pseudonormalization). The average left ventricular global longitudinal strain is -29.1 %. 2. Right ventricular systolic function is mildly reduced. The right ventricular size is normal. 3. Left atrial size was severely dilated. 4. Mitral valve leaflets are not adequately visualized. There appears to be a mobile echodensity 0.59 x 0.89 cm attached possibly to the anterior mitral valve leaflet by a stalk and is noted on the atrial side during systole. Differential diagnoses include chordal rupture, flail mitral valve leaflet, tumor, thrombus, vegetation. Clinical correlation is recommended. Recommend TEE to further evaluate the mitral valve pathology. Moderate eccentric anteriorly directed mitral valve regurgitation.  No evidence of mitral stenosis. 5. The aortic valve was not well visualized. Aortic valve regurgitation is not visualized. No aortic stenosis is present. 6. The inferior vena cava is normal in size with greater than 50% respiratory variability, suggesting right atrial pressure of 3 mmHg.  Comparison(s): No prior Echocardiogram.  FINDINGS Left Ventricle: Left ventricular ejection fraction, by estimation, is 60 to 65%. The left ventricle has normal function. The left ventricle has no regional wall motion abnormalities. The average left ventricular global longitudinal  strain is -29.1 %. The left ventricular internal cavity size was normal in size. There is no left ventricular hypertrophy. Left ventricular diastolic parameters are consistent with Grade II diastolic dysfunction (pseudonormalization).  Right Ventricle: The right ventricular size is normal. No increase in right ventricular wall thickness. Right ventricular systolic function is mildly reduced.  Left Atrium: Left atrial size was severely dilated.  Right Atrium: Right atrial size was normal in size.  Pericardium: There is no evidence of pericardial effusion.  Mitral Valve: Mitral valve leaflets are not adequately visualized. There appears to be a mobile echodensity 0.59 x 0.89 cm attached possibly to the anterior mitral valve leaflet by a stalk and is noted on the atrial side during systole. Differential diagnoses include chordal rupture, flail mitral valve leaflet, tumor, thrombus, vegetation. Clinical correlation is recommended. Recommend TEE to further evaluate the mitral valve pathology. Moderate eccentric anteriorly directed mitral valve regurgitation. No evidence of mitral valve stenosis.  Tricuspid Valve: The tricuspid valve is not well visualized. Tricuspid valve regurgitation is not demonstrated. No evidence of tricuspid stenosis.  Aortic Valve: The aortic valve was not well visualized. Aortic valve regurgitation is not visualized. No aortic stenosis is present. Aortic valve mean gradient measures 5.0 mmHg. Aortic valve peak gradient measures 11.4 mmHg. Aortic valve area, by VTI measures 2.26 cm.  Pulmonic Valve: The pulmonic valve was not well visualized. Pulmonic valve regurgitation is not visualized. No evidence of pulmonic stenosis.  Aorta: The aortic root is normal in size and structure.  Venous: The inferior vena cava is normal in size with greater than 50% respiratory variability, suggesting right atrial pressure of 3 mmHg.  IAS/Shunts: No atrial level shunt detected by color  flow Doppler.   LEFT VENTRICLE PLAX 2D LVIDd:         5.40 cm      Diastology LVIDs:         3.20 cm      LV e' medial:    7.29 cm/s LV PW:         0.80 cm      LV E/e' medial:  18.0 LV IVS:        0.80 cm      LV e' lateral:   9.14 cm/s LVOT diam:     1.90 cm      LV E/e' lateral: 14.3 LV SV:         77 LV SV Index:   40           2D Longitudinal Strain LVOT Area:     2.84 cm     2D Strain GLS (A2C):   -21.4 % 2D Strain GLS (A3C):   -34.5 % 2D Strain GLS (A4C):   -31.4 % LV Volumes (MOD)            2D Strain GLS Avg:     -29.1 % LV vol d, MOD A2C: 104.0 ml LV vol d, MOD A4C: 149.0 ml LV vol s, MOD A2C: 39.6 ml  LV vol s, MOD A4C: 54.9 ml LV SV MOD A2C:     64.4 ml LV SV MOD A4C:     149.0 ml LV SV MOD BP:      80.5 ml  RIGHT VENTRICLE RV S prime:     11.70 cm/s TAPSE (M-mode): 2.1 cm  LEFT ATRIUM           Index        RIGHT ATRIUM           Index LA diam:      4.60 cm 2.42 cm/m   RA Area:     15.80 cm LA Vol (A4C): 96.9 ml 50.99 ml/m  RA Volume:   37.70 ml  19.84 ml/m AORTIC VALVE AV Area (Vmax):    2.52 cm AV Area (Vmean):   2.73 cm AV Area (VTI):     2.26 cm AV Vmax:           168.50 cm/s AV Vmean:          106.000 cm/s AV VTI:            0.339 m AV Peak Grad:      11.4 mmHg AV Mean Grad:      5.0 mmHg LVOT Vmax:         150.00 cm/s LVOT Vmean:        102.000 cm/s LVOT VTI:          0.270 m LVOT/AV VTI ratio: 0.80  AORTA Ao Root diam: 3.30 cm  MITRAL VALVE                TRICUSPID VALVE MV Area (PHT): 3.34 cm     TR Peak grad:   33.6 mmHg MV Decel Time: 227 msec     TR Vmax:        290.00 cm/s MR Peak grad: 97.9 mmHg MR Mean grad: 113.5 mmHg    SHUNTS MR Vmax:      494.67 cm/s   Systemic VTI:  0.27 m MR Vmean:     508.5 cm/s    Systemic Diam: 1.90 cm MV E velocity: 131.00 cm/s MV A velocity: 62.40 cm/s MV E/A ratio:  2.10  Vishnu Priya Mallipeddi Electronically signed by Winfield Rast Mallipeddi Signature Date/Time: 11/02/2022/3:46:56  PM    Final   TEE  ECHO TEE 10/27/2023  Narrative TRANSESOPHOGEAL ECHO REPORT    Patient Name:   AIDSA OKAWA Date of Exam: 10/27/2023 Medical Rec #:  [id number]         Height:       67.0 in Accession #:    [id number]        Weight:       168.0 lb Date of Birth:  Oct 29, 1946         BSA:          1.878 m Patient Age:    77 years          BP:           125/68 mmHg Patient Gender: F                 HR:           64 bpm. Exam Location:  Inpatient  Procedure: Transesophageal Echo, 3D Echo, Cardiac Doppler and Color Doppler  Indications:     Mitral Valve Abnormality  History:         Patient has prior history of Echocardiogram examinations, most recent 11/02/2022. Mitral Valve Disease.  Sonographer:     Darlys Gales Referring Phys:  [id number] Ellsworth Lennox Diagnosing Phys: Charlton Haws MD  PROCEDURE: After discussion of the risks and benefits of a TEE, an informed consent was obtained from the patient. The transesophogeal probe was passed without difficulty through the esophogus of the patient. Sedation performed by different physician. The patient was monitored while under deep sedation. Anesthestetic sedation was provided intravenously by Anesthesiology: 260mg  of Propofol, 80mg  of Lidocaine. The patient developed no complications during the procedure.  IMPRESSIONS   1. Left ventricular ejection fraction, by estimation, is 55 to 60%. The left ventricle has normal function. 2. Right ventricular systolic function is normal. The right ventricular size is normal. 3. Left atrial size was moderately dilated. No left atrial/left atrial appendage thrombus was detected. 4. Right atrial size was mildly dilated. 5. 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 The mitral valve is myxomatous. Severe mitral valve  regurgitation. 6. The aortic valve is tricuspid. There is mild calcification of the aortic valve. Aortic valve regurgitation is not visualized. Aortic valve sclerosis is present, with no evidence of aortic valve stenosis.  FINDINGS Left Ventricle: Left ventricular ejection fraction, by estimation, is 55 to 60%. The left ventricle has normal function. The left ventricular internal cavity size was normal in size.  Right Ventricle: The right ventricular size is normal. No increase in right ventricular wall thickness. Right ventricular systolic function is normal.  Left Atrium: Left atrial size was moderately dilated. No left atrial/left atrial appendage thrombus was detected.  Right Atrium: Right atrial size was mildly dilated.  Pericardium: There is no evidence of pericardial effusion.  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 The mitral valve is myxomatous. Severe mitral valve regurgitation. MV peak gradient, 6.0 mmHg. The mean mitral valve gradient is 2.0 mmHg.  Tricuspid Valve: The tricuspid valve is normal in structure. Tricuspid valve regurgitation is mild.  Aortic Valve: The aortic valve is tricuspid. There is mild calcification of the aortic valve. Aortic valve regurgitation is not visualized. Aortic valve sclerosis is present, with no evidence of aortic valve stenosis.  Pulmonic Valve: The pulmonic valve was normal in structure. Pulmonic valve regurgitation is mild.  Aorta: The aortic root is normal in size and structure.  IAS/Shunts: No atrial level shunt detected by color flow Doppler.   MITRAL VALVE MV Area (plan): 6.04 cm MV Peak grad:   6.0 mmHg MV Mean grad:   2.0 mmHg MV Vmax:        1.22 m/s MV Vmean:       66.2 cm/s MR Peak grad:    68.2 mmHg MR Mean grad:    50.0 mmHg MR Vmax:          413.00 cm/s MR Vmean:        343.0 cm/s MR PISA:         14.14 cm MR PISA Eff ROA: 123 mm MR PISA Radius:  1.50 cm  Charlton Haws MD Electronically signed by Charlton Haws MD Signature Date/Time: 10/27/2023/10:32:29 AM    Final            02/07/2023: ALT 16; TSH 2.860 10/03/2023: BUN 20; Creatinine,  Ser 0.80; Hemoglobin 12.5; Platelets 192; Potassium 4.0; Sodium 139   STS RISK CALCULATOR: Pending   NHYA CLASS: 2     ASSESSMENT AND PLAN:   Nonrheumatic mitral valve regurgitation - Plan: EKG 12-Lead, Basic metabolic panel, CBC  Essential hypertension, benign - Plan: EKG 12-Lead  CKD (chronic kidney disease) stage 2, GFR 60-89 ml/min - Plan: EKG 12-Lead  Pre-procedure lab exam - Plan: Basic metabolic panel, CBC   The patient has developed severe degenerative mitral regurgitation.  I reviewed her TEE which demonstrates posterior P2 prolapse with flail.  I think the patient would be much more symptomatic if it were not for her hip and knee osteoarthritis.  With severe degenerative mitral regurgitation and the ejection fraction of less than 60% this is a class I indication for repair.  I will refer the patient for a right heart catheterization and coronary angiography study.  I will refer the patient to Dr. Leafy Ro for surgical opinion.  She seems to be a fairly good candidate for surgery or mitral transcatheter edge-to-edge repair.  Further recommendations will be issued once all clinical data has been reviewed in a multidisciplinary fashion.  I have personally reviewed the patients imaging data as summarized above.  I have reviewed the natural history of mitral regurgitation with the patient and family members who are present today. We have discussed the limitations of medical therapy and the poor prognosis associated with symptomatic mitral regurgitation. We have also reviewed potential treatment options, including palliative medical therapy, conventional mitral surgery, and  transcatheter mitral edge-to-edge repair. We discussed treatment options in the context of this patient's specific comorbid medical conditions.   All of the patient's questions were answered today. Will make further recommendations based on the results of studies outlined above.   I spent 55 minutes reviewing all clinical data during and prior to this visit including all relevant imaging studies, laboratories, clinical information from other health systems and prior notes from both Cardiology and other specialties, interviewing the patient, conducting a complete physical examination, and coordinating care in order to formulate a comprehensive and personalized evaluation and treatment plan.   Orbie Pyo, MD  10/30/2023 11:11 AM    [facility] [address] [place], [place], [place]  [address] Phone: [phone]; Fax: [phone]

## 2023-10-30 NOTE — Patient Instructions (Addendum)
Medication Instructions:  No changes *If you need a refill on your cardiac medications before your next appointment, please call your pharmacy*   Lab Work: Today: cbc, bmet   Testing/Procedures: Your physician has requested that you have a cardiac catheterization. Cardiac catheterization is used to diagnose and/or treat various heart conditions. Doctors may recommend this procedure for a number of different reasons. The most common reason is to evaluate chest pain. Chest pain can be a symptom of coronary artery disease (CAD), and cardiac catheterization can show whether plaque is narrowing or blocking your heart's arteries. This procedure is also used to evaluate the valves, as well as measure the blood flow and oxygen levels in different parts of your heart. For further information please visit [email or web address] Please follow instruction sheet, as given.   Follow-Up: Per Structural Heart Team  Other Instructions       Cardiac/Peripheral Catheterization   You are scheduled for a Cardiac Catheterization on Thursday, December 5 with Dr. Alverda Skeans.  1. Please arrive at the [facility] (Main Entrance A) at [facility]: [address] [place], [place] [address] at 10:00 (This time is TWO hour(s) before your procedure to ensure your preparation).   Free valet parking service is available. You will check in at ADMITTING. The support person will be asked to wait in the waiting room.  It is OK to have someone drop you off and come back when you are ready to be discharged.        Special note: Every effort is made to have your procedure done on time. Please understand that emergencies sometimes delay scheduled procedures.  2. Diet: Do not eat solid foods after midnight.  You may have clear liquids until 5 AM the day of the procedure.  3. Labs: You will need to have blood drawn today. You do not need to be fasting.  4. Medication instructions in preparation for your  procedure:   Contrast Allergy: No   On the morning of your procedure, take Aspirin 81 mg and any morning medicines NOT listed above.  You may use sips of water.  5. Plan to go home the same day, you will only stay overnight if medically necessary. 6. You MUST have a responsible adult to drive you home. 7. An adult MUST be with you the first 24 hours after you arrive home. 8. Bring a current list of your medications, and the last time and date medication taken. 9. Bring ID and current insurance cards. 10.Please wear clothes that are easy to get on and off and wear slip-on shoes.  Thank you for allowing [place] to care for you!   -- [facility]

## 2023-10-30 NOTE — Progress Notes (Signed)
Patient ID: [name] MRN: [id number] DOB/AGE: 1946-10-22 77 y.o.  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 female with the indicated medical history here for recommendations regarding her severe degenerative mitral regurgitation.  She had been seen previously.  She was noted to possess an incidental murmur.  She was referred for a TTE in 2023 which demonstrated possibly significant mitral regurgitation.  She is referred for a TEE in 2023 but this was unsuccessful.  She underwent EGD which cleared her for a TEE.  This was performed recently which demonstrated severe degenerative mitral regurgitation.  This demonstrated the findings as detailed above.  She does not have a history of atrial fibrillation.  The patient is limited by hip and knee issues.  She does not walk all that much.  However compared to a few years ago she has definitely noticed a change.  She will get short of breath when she goes upstairs.  She does feel like she is slowing down.  She is not short of breath at rest.  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  She walks maybe a mile and a half but typically slope pace with many stops in her working day.  She denies any heart failure symptoms, presyncope or syncope.  She does bruise easily but has not had any severe bleeding.  She denies any Marina Goodell says nocturnal dyspnea or orthopnea.  She had been hospitalized at [facility] in 2018 due to nausea and chest discomfort.  Her evaluation there was negative.  This was thought to be due to a GI issue.  She had an echocardiogram at that time which showed only mild  mitral regurgitation.  She remembers being quite sick during COVID.  She is not sure if she exactly contracted COVID.  She does not remember becoming acutely short of breath during that time.  She has not been hospitalized for acute heart failure.  She does not smoke.  She sees a Education officer, community on a regular basis.  She reports good dental health.  Past Medical History:  Diagnosis Date   Arthritis    Cancer [facility])    Thyroid cancer   COVID-19 virus infection 12/2020   Family history of adverse reaction to anesthesia    mother "couldn't wake up good" and would throw up   GERD (gastroesophageal reflux disease)    Heart murmur    History of kidney stones    Hypercholesteremia    resolved after gastric bypass   Hypertension    Hypothyroidism     Past Surgical History:  Procedure Laterality Date   ABDOMINAL HYSTERECTOMY     BALLOON DILATION N/A 07/17/2023   Procedure: BALLOON DILATION;  Surgeon: Lanelle Bal, DO;  Location: [facility] ENDO SUITE;  Service: Endoscopy;  Laterality: N/A;   BILATERAL SALPINGOOPHORECTOMY     BREAST EXCISIONAL BIOPSY Bilateral 1995   benign   CHOLECYSTECTOMY     COLONOSCOPY  11/14/2011   Procedure: COLONOSCOPY;  Surgeon: Corbin Ade, MD;  Location: [facility];  Service: Endoscopy;  Laterality: N/A;   ESOPHAGOGASTRODUODENOSCOPY (EGD) WITH PROPOFOL N/A 07/17/2023   Procedure: ESOPHAGOGASTRODUODENOSCOPY (EGD) WITH L;  Surgeon: Lanelle Bal, DO;  Location: [facility];  Service: Endoscopy;  Laterality: N/A;   REPLACEMENT TOTAL KNEE BILATERAL     ROUX-EN-Y PROCEDURE  2007   TEE WITHOUT CARDIOVERSION N/A 11/11/2022   Procedure: TRANSESOPHAGEAL ECHOCARDIOGRAM (TEE);  Surgeon: Marjo Bicker, MD;  Location: [facility] ORS;  Service: Cardiovascular;  Laterality: N/A;   TONSILLECTOMY     TOTAL HIP ARTHROPLASTY Right 12/23/2019   Procedure: RIGHT TOTAL HIP ARTHROPLASTY ANTERIOR APPROACH;  Surgeon: Tarry Kos, MD;  Location: [facility] OR;  Service: Orthopedics;  Laterality:  Right;   TOTAL HIP ARTHROPLASTY Left 11/21/2022   Procedure: LEFT TOTAL HIP ARTHROPLASTY ANTERIOR APPROACH;  Surgeon: Tarry Kos, MD;  Location: [facility] OR;  Service: Orthopedics;  Laterality: Left;  3-C   TOTAL THYROIDECTOMY  12/2010   TRANSESOPHAGEAL ECHOCARDIOGRAM (CATH LAB) N/A 10/27/2023   Procedure: TRANSESOPHAGEAL ECHOCARDIOGRAM;  Surgeon: Wendall Stade, MD;  [facility];  Service: Cardiovascular;  Laterality: N/A;   TUBAL LIGATION      Family History  Problem Relation Age of Onset   Breast cancer Mother    Pneumonia Mother 53   CAD Father 70   Breast cancer Sister    CAD Sister 80   Other Maternal Grandmother 52   CAD Brother 36   Other Brother 62       drug overdose   CAD Other 32   Colon cancer Neg Hx     Social History   Socioeconomic History   Marital status: Widowed    Spouse name: Not on file   Number of children: Not on file   Years of education: Not on file   Highest education level: Some college, no degree  Occupational History   Occupation: Retired Clinical biochemist  Tobacco Use   Smoking status: Never   Smokeless tobacco: Never  Vaping Use   Vaping status: Never Used  Substance and Sexual Activity   Alcohol use: No   Drug use: No   Sexual activity: Not on file  Other Topics Concern   Not on file  Social History Narrative   Not on file   Social Determinants of Health   Financial Resource Strain: Low Risk  (09/06/2023)   Overall Financial Resource Strain (CARDIA)    Difficulty of Paying Living Expenses: Not very hard  Food Insecurity: No Food Insecurity (09/06/2023)   Hunger Vital Sign    Worried About Running Out of Food in the Last Year: Never true    Ran Out of Food in the Last Year: Never true  Transportation Needs: No Transportation Needs (09/06/2023)   PRAPARE - Administrator, Civil Service (Medical): No    Lack of Transportation (Non-Medical): No  Physical Activity: Unknown (09/06/2023)   Exercise Vital Sign     Days of Exercise per Week: 4 days    Minutes of Exercise per Session: Not on file  Stress: No Stress Concern Present (09/06/2023)   [employer] of Occupational Health - Occupational Stress Questionnaire    Feeling of Stress : Not at all  Social Connections: Moderately Integrated (09/06/2023)   Social Connection and Isolation Panel [NHANES]    Frequency of Communication with Friends and Family: More than three times a week    Frequency of Social Gatherings with Friends and Family: Twice a week    Attends Religious Services: More than 4 times per year    Active Member of [employer] or Organizations: Yes    Attends Banker Meetings: More than 4 times per  year    Marital Status: Widowed  Intimate Partner Violence: Not At Risk (11/21/2022)   Humiliation, Afraid, Rape, and Kick questionnaire    Fear of Current or Ex-Partner: No    Emotionally Abused: No    Physically Abused: No    Sexually Abused: No     Prior to Admission medications   Medication Sig Start Date End Date Taking? Authorizing Provider  amoxicillin (AMOXIL) 500 MG capsule Take 2,000 mg by mouth See admin instructions. Take 4 capsules (2000 mg) by mouth 1 hour prior to dental appointments    [provider]  Biotin 5000 MCG CAPS Take 5,000 mcg by mouth daily.    [provider]  Calcium Citrate-Vitamin D (CALCIUM CITRATE+D3 PO) Take 1 tablet by mouth daily.    [provider]  carboxymethylcellulose (REFRESH PLUS) 0.5 % SOLN Place 1 drop into both eyes in the morning and at bedtime.    [provider]  Cholecalciferol (VITAMIN D3 ULTRA STRENGTH) 125 MCG (5000 UT) capsule Take 5,000 Units by mouth daily.    [provider]  cyanocobalamin 2000 MCG tablet Take 2,000 mcg by mouth daily.    [provider]  enalapril (VASOTEC) 10 MG tablet Take 1 tablet (10 mg total) by mouth daily. Patient taking differently: Take 10 mg by mouth at bedtime. 09/19/23   Tommie Sams,  DO  famotidine (PEPCID) 40 MG tablet Take 1 tablet (40 mg total) by mouth daily. 09/19/23   Tommie Sams, DO  ferrous sulfate 325 (65 FE) MG tablet Take 325 mg by mouth daily.    [provider]  levothyroxine (SYNTHROID) 150 MCG tablet Take 1 tablet (150 mcg total) by mouth daily before breakfast. Patient taking differently: Take 150 mcg by mouth See admin instructions. Take 150 mg daily except skip dose on Sundays 12/29/16   Maxie Barb, MD  Magnesium 250 MG CAPS Take 250 mg by mouth daily.    [provider]  Multiple Vitamin (MULTIVITAMIN WITH MINERALS) TABS tablet Take 1 tablet by mouth in the morning and at bedtime.    [provider]  valACYclovir (VALTREX) 1000 MG tablet TAKE 2 TABLETS AT FIRST SIGN OF FEVER BLISTER, THEN 2 TABLETS 2 HOURSLATER Patient taking differently: Take 2,000 mg by mouth 2 (two) times daily as needed (fever blisters.). 05/11/22   Tommie Sams, DO  Vibegron (GEMTESA) 75 MG TABS Take 75 mg by mouth daily. 08/07/23   [provider]  Zinc 50 MG TABS Take 50 mg by mouth daily.    [provider]    Allergies  Allergen Reactions   Codeine Nausea And Vomiting   Demerol Nausea And Vomiting   Nsaids Other (See Comments)    Due to gastric bypass    REVIEW OF SYSTEMS:  General: no fevers/chills/night sweats Eyes: no blurry vision, diplopia, or amaurosis ENT: no sore throat or hearing loss Resp: no cough, wheezing, or hemoptysis CV: no edema or palpitations GI: no abdominal pain, nausea, vomiting, diarrhea, or constipation GU: no dysuria, frequency, or hematuria Skin: no rash Neuro: no headache, numbness, tingling, or weakness of extremities Musculoskeletal: no joint pain or swelling Heme: no bleeding, DVT, or easy bruising Endo: no polydipsia or polyuria  BP (!) 142/80   Pulse (!) 58   Ht 5\' 7"  (1.702 m)   Wt 167 lb 6.4 oz (75.9 kg)   SpO2 99%   BMI 26.22 kg/m   PHYSICAL EXAM: GEN:  AO x 3 in no  acute distress HEENT: normal Dentition: Some missing teeth Neck: JVP normal. +2 carotid upstrokes without bruits. No thyromegaly. Lungs: equal expansion, clear bilaterally CV: Apex is discrete and nondisplaced, RRR with 4/6 holosystolic murmur throughout precordium Abd: soft, non-tender, non-distended; no bruit; positive bowel sounds Ext: no edema, ecchymoses, or cyanosis Vascular: 2+ femoral pulses, 2+ radial pulses       Skin: warm and dry without rash Neuro: CN II-XII grossly intact; motor and sensory grossly intact    DATA AND STUDIES:  EKG: EKG performed October 2024 demonstrates sinus rhythm  EKG Interpretation Date/Time:  Monday October 30 2023 09:24:56 EST Ventricular Rate:  58 PR Interval:  162 QRS Duration:  100 QT Interval:  440 QTC Calculation: 431 R Axis:   52  Text Interpretation: Sinus bradycardia Nonspecific ST abnormality When compared with ECG of 27-Sep-2023 14:24, Premature atrial complexes are no longer Present Confirmed by Alverda Skeans ([id number]) on 10/30/2023 9:26:40 AM        Cardiac Studies & Procedures     STRESS TESTS  NM MYOCAR MULTI W/SPECT W 12/29/2016  Narrative  There was no ST segment deviation noted during stress.  The study is normal. Inferior defect most consistent with subdiaphragmatic attenuation and gut radiotracer uptake artifact as opposed to true inferior wall ischemia.  This is a low risk study.  The left ventricular ejection fraction is normal (55-65%).   ECHOCARDIOGRAM  ECHOCARDIOGRAM COMPLETE 11/02/2022  Narrative ECHOCARDIOGRAM REPORT    Patient Name:   [name] Date of Exam: 11/02/2022 Medical Rec #:  [id number]         Height:       67.0 in Accession #:    [id number]        Weight:       172.8 lb Date of Birth:  December 03, 1946         BSA:          1.900 m Patient Age:    76 years          BP:           138/74 mmHg Patient Gender: F                 HR:           58 bpm. Exam Location:  [place]  Procedure:  2D Echo, Color Doppler, Cardiac Doppler and Strain Analysis  Indications:    R01.1 Murmur  History:        Patient has prior history of Echocardiogram examinations, most recent 12/28/2016. Signs/Symptoms:Murmur; Risk Factors:Hypertension, Dyslipidemia and Non-Smoker.  Sonographer:    Jake Seats RDMS, RVT, RDCS Referring Phys: [id number] JAYCE G COOK  IMPRESSIONS   1. Left ventricular ejection fraction, by estimation, is 60 to 65%. The left ventricle has normal function. The left ventricle has no regional wall motion abnormalities. Left ventricular diastolic parameters are consistent with Grade II diastolic dysfunction (pseudonormalization). The average left ventricular global longitudinal strain is -29.1 %. 2. Right ventricular systolic function is mildly reduced. The right ventricular size is normal. 3. Left atrial size was severely dilated. 4. Mitral valve leaflets are not adequately visualized. There appears to be a mobile echodensity 0.59 x 0.89 cm attached possibly to the anterior mitral valve leaflet by a stalk and is noted on the atrial side during systole. Differential diagnoses include chordal rupture, flail mitral valve leaflet, tumor, thrombus, vegetation. Clinical correlation is recommended. Recommend TEE to further evaluate the mitral valve pathology. Moderate eccentric anteriorly directed mitral valve regurgitation.  No evidence of mitral stenosis. 5. The aortic valve was not well visualized. Aortic valve regurgitation is not visualized. No aortic stenosis is present. 6. The inferior vena cava is normal in size with greater than 50% respiratory variability, suggesting right atrial pressure of 3 mmHg.  Comparison(s): No prior Echocardiogram.  FINDINGS Left Ventricle: Left ventricular ejection fraction, by estimation, is 60 to 65%. The left ventricle has normal function. The left ventricle has no regional wall motion abnormalities. The average left ventricular global longitudinal  strain is -29.1 %. The left ventricular internal cavity size was normal in size. There is no left ventricular hypertrophy. Left ventricular diastolic parameters are consistent with Grade II diastolic dysfunction (pseudonormalization).  Right Ventricle: The right ventricular size is normal. No increase in right ventricular wall thickness. Right ventricular systolic function is mildly reduced.  Left Atrium: Left atrial size was severely dilated.  Right Atrium: Right atrial size was normal in size.  Pericardium: There is no evidence of pericardial effusion.  Mitral Valve: Mitral valve leaflets are not adequately visualized. There appears to be a mobile echodensity 0.59 x 0.89 cm attached possibly to the anterior mitral valve leaflet by a stalk and is noted on the atrial side during systole. Differential diagnoses include chordal rupture, flail mitral valve leaflet, tumor, thrombus, vegetation. Clinical correlation is recommended. Recommend TEE to further evaluate the mitral valve pathology. Moderate eccentric anteriorly directed mitral valve regurgitation. No evidence of mitral valve stenosis.  Tricuspid Valve: The tricuspid valve is not well visualized. Tricuspid valve regurgitation is not demonstrated. No evidence of tricuspid stenosis.  Aortic Valve: The aortic valve was not well visualized. Aortic valve regurgitation is not visualized. No aortic stenosis is present. Aortic valve mean gradient measures 5.0 mmHg. Aortic valve peak gradient measures 11.4 mmHg. Aortic valve area, by VTI measures 2.26 cm.  Pulmonic Valve: The pulmonic valve was not well visualized. Pulmonic valve regurgitation is not visualized. No evidence of pulmonic stenosis.  Aorta: The aortic root is normal in size and structure.  Venous: The inferior vena cava is normal in size with greater than 50% respiratory variability, suggesting right atrial pressure of 3 mmHg.  IAS/Shunts: No atrial level shunt detected by color  flow Doppler.   LEFT VENTRICLE PLAX 2D LVIDd:         5.40 cm      Diastology LVIDs:         3.20 cm      LV e' medial:    7.29 cm/s LV PW:         0.80 cm      LV E/e' medial:  18.0 LV IVS:        0.80 cm      LV e' lateral:   9.14 cm/s LVOT diam:     1.90 cm      LV E/e' lateral: 14.3 LV SV:         77 LV SV Index:   40           2D Longitudinal Strain LVOT Area:     2.84 cm     2D Strain GLS (A2C):   -21.4 % 2D Strain GLS (A3C):   -34.5 % 2D Strain GLS (A4C):   -31.4 % LV Volumes (MOD)            2D Strain GLS Avg:     -29.1 % LV vol d, MOD A2C: 104.0 ml LV vol d, MOD A4C: 149.0 ml LV vol s, MOD A2C: 39.6 ml  LV vol s, MOD A4C: 54.9 ml LV SV MOD A2C:     64.4 ml LV SV MOD A4C:     149.0 ml LV SV MOD BP:      80.5 ml  RIGHT VENTRICLE RV S prime:     11.70 cm/s TAPSE (M-mode): 2.1 cm  LEFT ATRIUM           Index        RIGHT ATRIUM           Index LA diam:      4.60 cm 2.42 cm/m   RA Area:     15.80 cm LA Vol (A4C): 96.9 ml 50.99 ml/m  RA Volume:   37.70 ml  19.84 ml/m AORTIC VALVE AV Area (Vmax):    2.52 cm AV Area (Vmean):   2.73 cm AV Area (VTI):     2.26 cm AV Vmax:           168.50 cm/s AV Vmean:          106.000 cm/s AV VTI:            0.339 m AV Peak Grad:      11.4 mmHg AV Mean Grad:      5.0 mmHg LVOT Vmax:         150.00 cm/s LVOT Vmean:        102.000 cm/s LVOT VTI:          0.270 m LVOT/AV VTI ratio: 0.80  AORTA Ao Root diam: 3.30 cm  MITRAL VALVE                TRICUSPID VALVE MV Area (PHT): 3.34 cm     TR Peak grad:   33.6 mmHg MV Decel Time: 227 msec     TR Vmax:        290.00 cm/s MR Peak grad: 97.9 mmHg MR Mean grad: 113.5 mmHg    SHUNTS MR Vmax:      494.67 cm/s   Systemic VTI:  0.27 m MR Vmean:     508.5 cm/s    Systemic Diam: 1.90 cm MV E velocity: 131.00 cm/s MV A velocity: 62.40 cm/s MV E/A ratio:  2.10  Vishnu Priya Mallipeddi Electronically signed by Winfield Rast Mallipeddi Signature Date/Time: 11/02/2022/3:46:56  PM    Final   TEE  ECHO TEE 10/27/2023  Narrative TRANSESOPHOGEAL ECHO REPORT    Patient Name:   [name] Date of Exam: 10/27/2023 Medical Rec #:  [id number]         Height:       67.0 in Accession #:    [id number]        Weight:       168.0 lb Date of Birth:  Oct 29, 1946         BSA:          1.878 m Patient Age:    77 years          BP:           125/68 mmHg Patient Gender: F                 HR:           64 bpm. Exam Location:  Inpatient  Procedure: Transesophageal Echo, 3D Echo, Cardiac Doppler and Color Doppler  Indications:     Mitral Valve Abnormality  History:         Patient has prior history of Echocardiogram examinations, most recent 11/02/2022. Mitral Valve Disease.  Sonographer:     Darlys Gales Referring Phys:  [id number] Ellsworth Lennox Diagnosing Phys: Charlton Haws MD  PROCEDURE: After discussion of the risks and benefits of a TEE, an informed consent was obtained from the patient. The transesophogeal probe was passed without difficulty through the esophogus of the patient. Sedation performed by different physician. The patient was monitored while under deep sedation. Anesthestetic sedation was provided intravenously by Anesthesiology: 260mg  of Propofol, 80mg  of Lidocaine. The patient developed no complications during the procedure.  IMPRESSIONS   1. Left ventricular ejection fraction, by estimation, is 55 to 60%. The left ventricle has normal function. 2. Right ventricular systolic function is normal. The right ventricular size is normal. 3. Left atrial size was moderately dilated. No left atrial/left atrial appendage thrombus was detected. 4. Right atrial size was mildly dilated. 5. 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 The mitral valve is myxomatous. Severe mitral valve  regurgitation. 6. The aortic valve is tricuspid. There is mild calcification of the aortic valve. Aortic valve regurgitation is not visualized. Aortic valve sclerosis is present, with no evidence of aortic valve stenosis.  FINDINGS Left Ventricle: Left ventricular ejection fraction, by estimation, is 55 to 60%. The left ventricle has normal function. The left ventricular internal cavity size was normal in size.  Right Ventricle: The right ventricular size is normal. No increase in right ventricular wall thickness. Right ventricular systolic function is normal.  Left Atrium: Left atrial size was moderately dilated. No left atrial/left atrial appendage thrombus was detected.  Right Atrium: Right atrial size was mildly dilated.  Pericardium: There is no evidence of pericardial effusion.  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 The mitral valve is myxomatous. Severe mitral valve regurgitation. MV peak gradient, 6.0 mmHg. The mean mitral valve gradient is 2.0 mmHg.  Tricuspid Valve: The tricuspid valve is normal in structure. Tricuspid valve regurgitation is mild.  Aortic Valve: The aortic valve is tricuspid. There is mild calcification of the aortic valve. Aortic valve regurgitation is not visualized. Aortic valve sclerosis is present, with no evidence of aortic valve stenosis.  Pulmonic Valve: The pulmonic valve was normal in structure. Pulmonic valve regurgitation is mild.  Aorta: The aortic root is normal in size and structure.  IAS/Shunts: No atrial level shunt detected by color flow Doppler.   MITRAL VALVE MV Area (plan): 6.04 cm MV Peak grad:   6.0 mmHg MV Mean grad:   2.0 mmHg MV Vmax:        1.22 m/s MV Vmean:       66.2 cm/s MR Peak grad:    68.2 mmHg MR Mean grad:    50.0 mmHg MR Vmax:          413.00 cm/s MR Vmean:        343.0 cm/s MR PISA:         14.14 cm MR PISA Eff ROA: 123 mm MR PISA Radius:  1.50 cm  Charlton Haws MD Electronically signed by Charlton Haws MD Signature Date/Time: 10/27/2023/10:32:29 AM    Final            02/07/2023: ALT 16; TSH 2.860 10/03/2023: BUN 20; Creatinine,  Ser 0.80; Hemoglobin 12.5; Platelets 192; Potassium 4.0; Sodium 139   STS RISK CALCULATOR: Pending   NHYA CLASS: 2     ASSESSMENT AND PLAN:   Nonrheumatic mitral valve regurgitation - Plan: EKG 12-Lead, Basic metabolic panel, CBC  Essential hypertension, benign - Plan: EKG 12-Lead  CKD (chronic kidney disease) stage 2, GFR 60-89 ml/min - Plan: EKG 12-Lead  Pre-procedure lab exam - Plan: Basic metabolic panel, CBC   The patient has developed severe degenerative mitral regurgitation.  I reviewed her TEE which demonstrates posterior P2 prolapse with flail.  I think the patient would be much more symptomatic if it were not for her hip and knee osteoarthritis.  With severe degenerative mitral regurgitation and the ejection fraction of less than 60% this is a class I indication for repair.  I will refer the patient for a right heart catheterization and coronary angiography study.  I will refer the patient to Dr. Leafy Ro for surgical opinion.  She seems to be a fairly good candidate for surgery or mitral transcatheter edge-to-edge repair.  Further recommendations will be issued once all clinical data has been reviewed in a multidisciplinary fashion.  I have personally reviewed the patients imaging data as summarized above.  I have reviewed the natural history of mitral regurgitation with the patient and family members who are present today. We have discussed the limitations of medical therapy and the poor prognosis associated with symptomatic mitral regurgitation. We have also reviewed potential treatment options, including palliative medical therapy, conventional mitral surgery, and  transcatheter mitral edge-to-edge repair. We discussed treatment options in the context of this patient's specific comorbid medical conditions.   All of the patient's questions were answered today. Will make further recommendations based on the results of studies outlined above.   I spent 55 minutes reviewing all clinical data during and prior to this visit including all relevant imaging studies, laboratories, clinical information from other health systems and prior notes from both Cardiology and other specialties, interviewing the patient, conducting a complete physical examination, and coordinating care in order to formulate a comprehensive and personalized evaluation and treatment plan.   Orbie Pyo, MD  10/30/2023 11:11 AM    [facility] [address] [place], [place], [place]  [address] Phone: [phone]; Fax: [phone]

## 2023-10-31 LAB — CBC
Hematocrit: 39.5 % (ref 34.0–46.6)
Hemoglobin: 12.8 g/dL (ref 11.1–15.9)
MCH: 31.1 pg (ref 26.6–33.0)
MCHC: 32.4 g/dL (ref 31.5–35.7)
MCV: 96 fL (ref 79–97)
Platelets: 188 10*3/uL (ref 150–450)
RBC: 4.12 x10E6/uL (ref 3.77–5.28)
RDW: 13.4 % (ref 11.7–15.4)
WBC: 5.5 10*3/uL (ref 3.4–10.8)

## 2023-10-31 LAB — BASIC METABOLIC PANEL
BUN/Creatinine Ratio: 20 (ref 12–28)
BUN: 16 mg/dL (ref 8–27)
CO2: 23 mmol/L (ref 20–29)
Calcium: 9 mg/dL (ref 8.7–10.3)
Chloride: 110 mmol/L — ABNORMAL HIGH (ref 96–106)
Creatinine, Ser: 0.82 mg/dL (ref 0.57–1.00)
Glucose: 79 mg/dL (ref 70–99)
Potassium: 4.4 mmol/L (ref 3.5–5.2)
Sodium: 143 mmol/L (ref 134–144)
eGFR: 73 mL/min/{1.73_m2} (ref >59)

## 2023-11-01 ENCOUNTER — Telehealth: Payer: Self-pay | Admitting: Orthopaedic Surgery

## 2023-11-01 NOTE — Telephone Encounter (Signed)
Called pt tried to leave VM no tone to leave message called daughter Camelia Eng on file LVM to CB about appt--pt had appt for 12/11 and Roda Shutters will not be here he will be in surgery appt changed to 12/10 pt needs to call and confirm change, please advise if pt calls back in

## 2023-11-06 ENCOUNTER — Encounter: Payer: Self-pay | Admitting: *Deleted

## 2023-11-06 ENCOUNTER — Telehealth: Payer: Self-pay | Admitting: *Deleted

## 2023-11-06 MED ORDER — ENALAPRIL MALEATE 10 MG PO TABS: 10.0000 mg | ORAL_TABLET | Freq: Every day | ORAL | 0 refills | Status: DC

## 2023-11-06 NOTE — Telephone Encounter (Signed)
Copied from CRM [id number]. Topic: Clinical - Medication Refill >> Nov 06, 2023 11:12 AM Hendricks Limes wrote: Most Recent Primary Care Visit:  Provider: Tommie Sams  Department: [facility]  Visit Type: OFFICE VISIT  Date: 09/19/2023  Medication: enalapril (VASOTEC) 10 MG tablet  Has the patient contacted their pharmacy? Yes (Agent: If no, request that the patient contact the pharmacy for the refill. If patient does not wish to contact the pharmacy document the reason why and proceed with request.) (Agent: If yes, when and what did the pharmacy advise?)  Is this the correct pharmacy for this prescription? Yes If no, delete pharmacy and type the correct one.  This is the patient's preferred pharmacy:  [facility] [address], [place] - [address] [place] [address] [place] [address] Phone: [phone] Fax: [phone]  [facility] - [place], [place] - [facility] AT Portal to [facility] [address] [place] [address] Phone: [phone] Fax: [phone]   Has the prescription been filled recently? No  Is the patient out of the medication? Yes  Has the patient been seen for an appointment in the last year OR does the patient have an upcoming appointment? Yes  Can we respond through MyChart? No  Agent: Please be advised that Rx refills may take up to 3 business days. We ask that you follow-up with your pharmacy.

## 2023-11-06 NOTE — Telephone Encounter (Signed)
Prescription sent electronically to pharmacy. Patient notified via my chart.

## 2023-11-08 DIAGNOSIS — E890 Postprocedural hypothyroidism: Secondary | ICD-10-CM | POA: Diagnosis not present

## 2023-11-08 DIAGNOSIS — H524 Presbyopia: Secondary | ICD-10-CM | POA: Diagnosis not present

## 2023-11-08 DIAGNOSIS — Z85850 Personal history of malignant neoplasm of thyroid: Secondary | ICD-10-CM | POA: Diagnosis not present

## 2023-11-08 NOTE — Progress Notes (Signed)
Please set pt up to see me in [place] to go over echo and discuss possible therapies   I am there next week

## 2023-11-09 ENCOUNTER — Other Ambulatory Visit: Payer: Self-pay | Admitting: Family Medicine

## 2023-11-15 ENCOUNTER — Telehealth: Payer: Self-pay | Admitting: *Deleted

## 2023-11-15 NOTE — Telephone Encounter (Signed)
Patient reports dry cough/congestion/scratchy throat starting a couple of days ago, she has a history of allergies with somewhat similar symptoms, has not taken any medication to see if improvement in symptoms.  Patient tells me she is afebrile, generally feels okay, and to her knowledge, has not been around anyone "sick" or with respiratory symptoms.   Patient would like to proceed with cath tomorrow since both her daughters have taken off work.  Patient advised I will forward to Dr Lynnette Caffey to update him and get his recommendation for proceeding with cath tomorrow.

## 2023-11-15 NOTE — Telephone Encounter (Signed)
Patient states she is not febrile or toxic, will take cough/allergy medication as needed, let our office know if symptoms change or she feels worse, will plan to proceed with cath 11/16/23.

## 2023-11-15 NOTE — Telephone Encounter (Signed)
Cardiac Catheterization scheduled at [facility] for: Arrival time [facility] Main Entrance A at:  Nothing to eat after midnight prior to procedure, clear liquids until 5 AM day of procedure.  CONTRAST ALLERGY:  Medication instructions: -Usual morning medications can be taken with sips of water including aspirin 81 mg.  Plan to go home the same day, you will only stay overnight if medically necessary.  You must have responsible adult to drive you home.  Someone must be with you the first 24 hours after you arrive home.

## 2023-11-15 NOTE — Telephone Encounter (Signed)
Cardiac Catheterization scheduled at [facility] for: Thursday November 16, 2023 12 Noon Arrival time [facility] Main Entrance A at: 10 AM  Nothing to eat after midnight prior to procedure, clear liquids until 5 AM day of procedure  Medication instructions: -Usual morning medications can be taken with sips of water including aspirin 81 mg.  Plan to go home the same day, you will only stay overnight if medically necessary.  You must have responsible adult to drive you home.  Someone must be with you the first 24 hours after you arrive home.  Reviewed procedure instructions with patient.

## 2023-11-16 ENCOUNTER — Other Ambulatory Visit: Payer: Self-pay

## 2023-11-16 ENCOUNTER — Encounter (HOSPITAL_COMMUNITY)
Admission: RE | Disposition: A | Discharge status: Home / Self Care | Payer: Self-pay | Source: Home / Self Care | Attending: Internal Medicine

## 2023-11-16 ENCOUNTER — Ambulatory Visit (HOSPITAL_COMMUNITY)
Admission: RE | Admit: 2023-11-16 | Discharge: 2023-11-16 | Disposition: A | Discharge status: Home / Self Care | Payer: Medicare HMO | Attending: Internal Medicine | Admitting: Internal Medicine

## 2023-11-16 DIAGNOSIS — I129 Hypertensive chronic kidney disease with stage 1 through stage 4 chronic kidney disease, or unspecified chronic kidney disease: Secondary | ICD-10-CM | POA: Diagnosis not present

## 2023-11-16 DIAGNOSIS — N182 Chronic kidney disease, stage 2 (mild): Secondary | ICD-10-CM | POA: Diagnosis not present

## 2023-11-16 DIAGNOSIS — Z9884 Bariatric surgery status: Secondary | ICD-10-CM | POA: Insufficient documentation

## 2023-11-16 DIAGNOSIS — I2510 Atherosclerotic heart disease of native coronary artery without angina pectoris: Secondary | ICD-10-CM | POA: Diagnosis not present

## 2023-11-16 DIAGNOSIS — I340 Nonrheumatic mitral (valve) insufficiency: Secondary | ICD-10-CM | POA: Insufficient documentation

## 2023-11-16 DIAGNOSIS — E039 Hypothyroidism, unspecified: Secondary | ICD-10-CM | POA: Diagnosis not present

## 2023-11-16 HISTORY — PX: RIGHT/LEFT HEART CATH AND CORONARY ANGIOGRAPHY: CATH118266

## 2023-11-16 LAB — POCT I-STAT 7, (LYTES, BLD GAS, ICA,H+H)
Acid-base deficit: 4 mmol/L — ABNORMAL HIGH (ref 0.0–2.0)
Bicarbonate: 19.9 mmol/L — ABNORMAL LOW (ref 20.0–28.0)
Calcium, Ion: 1.17 mmol/L (ref 1.15–1.40)
HCT: 33 % — ABNORMAL LOW (ref 36.0–46.0)
Hemoglobin: 11.2 g/dL — ABNORMAL LOW (ref 12.0–15.0)
O2 Saturation: 92 %
Potassium: 3.5 mmol/L (ref 3.5–5.1)
Sodium: 144 mmol/L (ref 135–145)
TCO2: 21 mmol/L — ABNORMAL LOW (ref 22–32)
pCO2 arterial: 32.6 mm[Hg] (ref 32–48)
pH, Arterial: 7.394 (ref 7.35–7.45)
pO2, Arterial: 64 mm[Hg] — ABNORMAL LOW (ref 83–108)

## 2023-11-16 LAB — POCT I-STAT EG7
Acid-base deficit: 4 mmol/L — ABNORMAL HIGH (ref 0.0–2.0)
Acid-base deficit: 5 mmol/L — ABNORMAL HIGH (ref 0.0–2.0)
Bicarbonate: 20.4 mmol/L (ref 20.0–28.0)
Bicarbonate: 20.8 mmol/L (ref 20.0–28.0)
Calcium, Ion: 1.13 mmol/L — ABNORMAL LOW (ref 1.15–1.40)
Calcium, Ion: 1.22 mmol/L (ref 1.15–1.40)
HCT: 32 % — ABNORMAL LOW (ref 36.0–46.0)
HCT: 33 % — ABNORMAL LOW (ref 36.0–46.0)
Hemoglobin: 10.9 g/dL — ABNORMAL LOW (ref 12.0–15.0)
Hemoglobin: 11.2 g/dL — ABNORMAL LOW (ref 12.0–15.0)
O2 Saturation: 67 %
O2 Saturation: 72 %
Potassium: 3.4 mmol/L — ABNORMAL LOW (ref 3.5–5.1)
Potassium: 3.5 mmol/L (ref 3.5–5.1)
Sodium: 144 mmol/L (ref 135–145)
Sodium: 144 mmol/L (ref 135–145)
TCO2: 21 mmol/L — ABNORMAL LOW (ref 22–32)
TCO2: 22 mmol/L (ref 22–32)
pCO2, Ven: 36.8 mm[Hg] — ABNORMAL LOW (ref 44–60)
pCO2, Ven: 37.9 mm[Hg] — ABNORMAL LOW (ref 44–60)
pH, Ven: 7.348 (ref 7.25–7.43)
pH, Ven: 7.351 (ref 7.25–7.43)
pO2, Ven: 36 mm[Hg] (ref 32–45)
pO2, Ven: 40 mm[Hg] (ref 32–45)

## 2023-11-16 SURGERY — RIGHT/LEFT HEART CATH AND CORONARY ANGIOGRAPHY
Anesthesia: LOCAL

## 2023-11-16 MED ORDER — ASPIRIN 81 MG PO CHEW: 81.0000 mg | CHEWABLE_TABLET | ORAL | Status: DC

## 2023-11-16 MED ORDER — SODIUM CHLORIDE 0.9 % WEIGHT BASED INFUSION: 3.0000 mL/kg/h | INTRAVENOUS | Status: AC

## 2023-11-16 MED ORDER — MIDAZOLAM HCL 2 MG/2ML IJ SOLN
INTRAMUSCULAR | Status: AC
Start: 2023-11-16 — End: ?
  Filled 2023-11-16: qty 2

## 2023-11-16 MED ORDER — ONDANSETRON HCL 4 MG/2ML IJ SOLN: 4.0000 mg | Freq: Four times a day (QID) | INTRAMUSCULAR | Status: DC | PRN

## 2023-11-16 MED ORDER — LIDOCAINE HCL (PF) 1 % IJ SOLN
INTRAMUSCULAR | Status: DC | PRN
  Administered 2023-11-16: 5 mL

## 2023-11-16 MED ORDER — MIDAZOLAM HCL 2 MG/2ML IJ SOLN
INTRAMUSCULAR | Status: DC | PRN
  Administered 2023-11-16: 1 mg via INTRAVENOUS

## 2023-11-16 MED ORDER — HEPARIN SODIUM (PORCINE) 1000 UNIT/ML IJ SOLN
INTRAMUSCULAR | Status: DC | PRN
  Administered 2023-11-16: 5000 [IU] via INTRA_ARTERIAL

## 2023-11-16 MED ORDER — FENTANYL CITRATE (PF) 100 MCG/2ML IJ SOLN
INTRAMUSCULAR | Status: AC
  Filled 2023-11-16: qty 2

## 2023-11-16 MED ORDER — FENTANYL CITRATE (PF) 100 MCG/2ML IJ SOLN
INTRAMUSCULAR | Status: DC | PRN
  Administered 2023-11-16: 25 ug via INTRAVENOUS

## 2023-11-16 MED ORDER — LIDOCAINE HCL (PF) 1 % IJ SOLN
INTRAMUSCULAR | Status: AC
  Filled 2023-11-16: qty 30

## 2023-11-16 MED ORDER — IOHEXOL 350 MG/ML SOLN
INTRAVENOUS | Status: DC | PRN
  Administered 2023-11-16: 48 mL via INTRA_ARTERIAL

## 2023-11-16 MED ORDER — VERAPAMIL HCL 2.5 MG/ML IV SOLN
INTRAVENOUS | Status: AC
  Filled 2023-11-16: qty 2

## 2023-11-16 MED ORDER — VERAPAMIL HCL 2.5 MG/ML IV SOLN
INTRAVENOUS | Status: DC | PRN
  Administered 2023-11-16: 10 mL via INTRA_ARTERIAL

## 2023-11-16 MED ORDER — HEPARIN (PORCINE) IN NACL 1000-0.9 UT/500ML-% IV SOLN
INTRAVENOUS | Status: DC | PRN
  Administered 2023-11-16: 1000 mL via INTRA_ARTERIAL

## 2023-11-16 MED ORDER — SODIUM CHLORIDE 0.9 % WEIGHT BASED INFUSION
1.0000 mL/kg/h | INTRAVENOUS | Status: DC
Start: 2023-11-16 — End: 2023-11-16

## 2023-11-16 MED ORDER — SODIUM CHLORIDE 0.9 % IV SOLN: 250.0000 mL | INTRAVENOUS | Status: DC | PRN

## 2023-11-16 MED ORDER — SODIUM CHLORIDE 0.9% FLUSH: 3.0000 mL | Freq: Two times a day (BID) | INTRAVENOUS | Status: DC

## 2023-11-16 MED ORDER — HYDRALAZINE HCL 20 MG/ML IJ SOLN: 10.0000 mg | INTRAMUSCULAR | Status: DC | PRN

## 2023-11-16 MED ORDER — HEPARIN SODIUM (PORCINE) 1000 UNIT/ML IJ SOLN
INTRAMUSCULAR | Status: AC
  Filled 2023-11-16: qty 10

## 2023-11-16 MED ORDER — ACETAMINOPHEN 325 MG PO TABS: 650.0000 mg | ORAL_TABLET | ORAL | Status: DC | PRN

## 2023-11-16 MED ORDER — LABETALOL HCL 5 MG/ML IV SOLN: 10.0000 mg | INTRAVENOUS | Status: DC | PRN

## 2023-11-16 MED ORDER — SODIUM CHLORIDE 0.9% FLUSH: 3.0000 mL | INTRAVENOUS | Status: DC | PRN

## 2023-11-16 SURGICAL SUPPLY — 11 items
CATH BALLN WEDGE 5F 110CM (CATHETERS) IMPLANT
CATH INFINITI 5FR ANG PIGTAIL (CATHETERS) IMPLANT
CATH INFINITI AMBI 6FR TG (CATHETERS) IMPLANT
DEVICE RAD TR BAND REGULAR (VASCULAR PRODUCTS) IMPLANT
GLIDESHEATH SLEND SS 6F .021 (SHEATH) IMPLANT
GUIDEWIRE .025 260CM (WIRE) IMPLANT
PACK CARDIAC CATHETERIZATION (CUSTOM PROCEDURE TRAY) ×1 IMPLANT
SET ATX-X65L (MISCELLANEOUS) IMPLANT
SHEATH GLIDE SLENDER 4/5FR (SHEATH) IMPLANT
WIRE EMERALD 3MM-J .035X150CM (WIRE) IMPLANT
WIRE HI TORQ VERSACORE-J 145CM (WIRE) IMPLANT

## 2023-11-16 NOTE — Progress Notes (Signed)
Assumed care at 1445. Report received from [facility].

## 2023-11-16 NOTE — Interval H&P Note (Signed)
History and Physical Interval Note:  11/16/2023 10:06 AM  [name]  has presented today for surgery, with the diagnosis of severe mitral reguritation.  The various methods of treatment have been discussed with the patient and family. After consideration of risks, benefits and other options for treatment, the patient has consented to  Procedure(s): RIGHT/LEFT HEART CATH AND CORONARY ANGIOGRAPHY (N/A) as a surgical intervention.  The patient's history has been reviewed, patient examined, no change in status, [name] for surgery.  I have reviewed the patient's chart and labs.  Questions were answered to the patient's satisfaction.     Orbie Pyo

## 2023-11-16 NOTE — Discharge Instructions (Signed)
Brachial Site Care   This sheet gives you information about how to care for yourself after your procedure. Your health care provider may also give you more specific instructions. If you have problems or questions, contact your health care provider. What can I expect after the procedure? After the procedure, it is common to have: Bruising and tenderness at the catheter insertion area. Follow these instructions at home:  Insertion site care Follow instructions from your health care provider about how to take care of your insertion site. Make sure you: Wash your hands with soap and water before you change your bandage (dressing). If soap and water are not available, use hand sanitizer. Remove your dressing as told by your health care provider. In 24 hours Check your insertion site every day for signs of infection. Check for: Redness, swelling, or pain. Pus or a bad smell. Warmth. You may shower 24-48 hours after the procedure. Do not apply powder or lotion to the site.  Activity For 24 hours after the procedure, or as directed by your health care provider: Do not push or pull heavy objects with the affected arm. Do not drive yourself home from the hospital or clinic. You may drive 24 hours after the procedure unless your health care provider tells you not to. Do not lift anything that is heavier than 10 lb (4.5 kg), or the limit that you are told, until your health care provider says that it is safe.  For 24 hours    Radial Site Care  This sheet gives you information about how to care for yourself after your procedure. Your health care provider may also give you more specific instructions. If you have problems or questions, contact your health care provider. What can I expect after the procedure? After the procedure, it is common to have: Bruising and tenderness at the catheter insertion area. Follow these instructions at home: Medicines Take over-the-counter and prescription  medicines only as told by your health care provider. Insertion site care Follow instructions from your health care provider about how to take care of your insertion site. Make sure you: Wash your hands with soap and water before you remove your bandage (dressing). If soap and water are not available, use hand sanitizer. May remove dressing in 24 hours. Check your insertion site every day for signs of infection. Check for: Redness, swelling, or pain. Fluid or blood. Pus or a bad smell. Warmth. Do no take baths, swim, or use a hot tub for 5 days. You may shower 24-48 hours after the procedure. Remove the dressing and gently wash the site with plain soap and water. Pat the area dry with a clean towel. Do not rub the site. That could cause bleeding. Do not apply powder or lotion to the site. Activity  For 24 hours after the procedure, or as directed by your health care provider: Do not flex or bend the affected arm. Do not push or pull heavy objects with the affected arm. Do not drive yourself home from the hospital or clinic. You may drive 24 hours after the procedure. Do not operate machinery or power tools. KEEP ARM ELEVATED THE REMAINDER OF THE DAY. Do not push, pull or lift anything that is heavier than 10 lb for 5 days. Ask your health care provider when it is okay to: Return to work or school. Resume usual physical activities or sports. Resume sexual activity. General instructions If the catheter site starts to bleed, raise your arm and put firm pressure  on the site. If the bleeding does not stop, get help right away. This is a medical emergency. DRINK PLENTY OF FLUIDS FOR THE NEXT 2-3 DAYS. No alcohol consumption for 24 hours after receiving sedation. If you went home on the same day as your procedure, a responsible adult should be with you for the first 24 hours after you arrive home. Keep all follow-up visits as told by your health care provider. This is important. Contact a  health care provider if: You have a fever. You have redness, swelling, or yellow drainage around your insertion site. Get help right away if: You have unusual pain at the radial site. The catheter insertion area swells very fast. The insertion area is bleeding, and the bleeding does not stop when you hold steady pressure on the area. Your arm or hand becomes pale, cool, tingly, or numb. These symptoms may represent a serious problem that is an emergency. Do not wait to see if the symptoms will go away. Get medical help right away. Call your local emergency services (911 in the [place] Do not drive yourself to the hospital. Summary After the procedure, it is common to have bruising and tenderness at the site. Follow instructions from your health care provider about how to take care of your radial site wound. Check the wound every day for signs of infection.  This information is not intended to replace advice given to you by your health care provider. Make sure you discuss any questions you have with your health care provider. Document Revised: 01/03/2018 Document Reviewed: 01/03/2018 Elsevier Patient Education  2020 [employer].

## 2023-11-17 ENCOUNTER — Encounter (HOSPITAL_COMMUNITY): Payer: Self-pay | Admitting: Internal Medicine

## 2023-11-20 NOTE — Progress Notes (Unsigned)
Office Visit Note   Patient: [name]           Date of Birth: [birth year]           MRN: [id number] Visit Date: 11/21/2023              Requested by: Tommie Sams, DO [address] [place],  [place] [address] PCP: Tommie Sams, DO   Assessment & Plan: Visit Diagnoses:  1. Status post total replacement of left hip     Plan: [name] is 1 year postop from left total hip replacement.  She is doing well.  She has no complaints.  She underwent cardiac catheterization 2 weeks ago.  She was found to have mitral valve regurgitation.  [name] is doing well from her hip replacement.  Dental prophylaxis reinforced.  Recheck in another year with repeat x-rays.  Follow-Up Instructions: Return in about 1 year (around 11/20/2024).   Orders:  Orders Placed This Encounter  Procedures   XR HIP UNILAT W OR W/O PELVIS 2-3 VIEWS LEFT   No orders of the defined types were placed in this encounter.     Procedures: No procedures performed   Clinical Data: No additional findings.   Subjective: Chief Complaint  Patient presents with   Left Hip - Follow-up    Left total hip arthroplasty 11/21/2022    HPI [name] is here for 1 year postop check. Review of Systems   Objective: Vital Signs: There were no vitals taken for this visit.  Physical Exam  Ortho Exam Exam of the left hip is unremarkable.  Fully healed surgical scar. Specialty Comments:  No specialty comments available.  Imaging: XR HIP UNILAT W OR W/O PELVIS 2-3 VIEWS LEFT  Result Date: 11/21/2023 Stable left total hip replacement without complication    PMFS History: Patient Active Problem List   Diagnosis Date Noted   Esophageal abnormality 06/27/2023   Status post total replacement of left hip 11/21/2022   MR (mitral regurgitation) 11/08/2022   Murmur, cardiac 11/01/2022   GERD (gastroesophageal reflux disease) 05/11/2022   Urge incontinence 02/21/2022   B12 deficiency 11/10/2021   Vitamin D  deficiency 11/10/2021   Status post total hip replacement, right 12/22/2020   History of gastric bypass 11/02/2020   History of thyroid cancer 10/22/2020   Hypothyroidism, 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   Essential hypertension, benign 09/09/2013   Hyperlipidemia 09/09/2013   Past Medical History:  Diagnosis Date   Arthritis    Cancer (HCC)    Thyroid cancer   COVID-19 virus infection 12/2020   Family history of adverse reaction to anesthesia    mother "couldn't wake up good" and would throw up   GERD (gastroesophageal reflux disease)    Heart murmur    History of kidney stones    Hypercholesteremia    resolved after gastric bypass   Hypertension    Hypothyroidism     Family History  Problem Relation Age of Onset   Breast cancer Mother    Pneumonia Mother 65   CAD Father 86   Breast cancer Sister    CAD Sister 81   Other Maternal Grandmother 47   CAD Brother 28   Other Brother 17       drug overdose   CAD Other 32   Colon cancer Neg Hx     Past Surgical History:  Procedure Laterality Date   ABDOMINAL HYSTERECTOMY     BALLOON DILATION N/A 07/17/2023   Procedure: BALLOON DILATION;  Surgeon:  Lanelle Bal, DO;  [facility]: [facility];  Service: Endoscopy;  Laterality: N/A;   BILATERAL SALPINGOOPHORECTOMY     BREAST EXCISIONAL BIOPSY Bilateral 1995   benign   CHOLECYSTECTOMY     COLONOSCOPY  11/14/2011   Procedure: COLONOSCOPY;  Surgeon: Corbin Ade, MD;  Location: [facility];  Service: Endoscopy;  Laterality: N/A;   ESOPHAGOGASTRODUODENOSCOPY (EGD) WITH PROPOFOL N/A 07/17/2023   Procedure: ESOPHAGOGASTRODUODENOSCOPY (EGD) WITH L;  Surgeon: Lanelle Bal, DO;  Location: [facility];  Service: Endoscopy;  Laterality: N/A;   REPLACEMENT TOTAL KNEE BILATERAL     RIGHT/LEFT HEART CATH AND CORONARY ANGIOGRAPHY N/A 11/16/2023   Procedure: RIGHT/LEFT HEART CATH AND CORONARY ANGIOGRAPHY;  Surgeon: Orbie Pyo, MD;  Location: [facility] INVAS[facility] CV LAB;  Service:  Cardiovascular;  Laterality: N/A;   ROUX-EN-Y PROCEDURE  2007   TEE WITHOUT CARDIOVERSION N/A 11/11/2022   Procedure: TRANSESOPHAGEAL ECHOCARDIOGRAM (TEE);  Surgeon: Marjo Bicker, MD;  Location: [facility] OR[facility];  Service: Cardiovascular;  Laterality: N/A;   TONSILLECTOMY     TOTAL HIP ARTHROPLASTY Right 12/23/2019   Procedure: RIGHT TOTAL HIP ARTHROPLASTY ANTERIOR APPROACH;  Surgeon: Tarry Kos, MD;  Location: [facility] OR;  Service: Orthopedics;  Laterality: Right;   TOTAL HIP ARTHROPLASTY Left 11/21/2022   Procedure: LEFT TOTAL HIP ARTHROPLASTY ANTERIOR APPROACH;  Surgeon: Tarry Kos, MD;  Location: [facility] OR;  Service: Orthopedics;  Laterality: Left;  3-C   TOTAL THYROIDECTOMY  12/2010   TRANSESOPHAGEAL ECHOCARDIOGRAM (CATH LAB) N/A 10/27/2023   Procedure: TRANSESOPHAGEAL ECHOCARDIOGRAM;  Surgeon: Wendall Stade, MD;  Location: [facility] INVASIVE CV LAB;  Service: Cardiovascular;  Laterality: N/A;   TUBAL LIGATION     Social History   Occupational History   Occupation: Retired Clinical biochemist  Tobacco Use   Smoking status: Never   Smokeless tobacco: Never  Vaping Use   Vaping status: Never Used  Substance and Sexual Activity   Alcohol use: No   Drug use: No   Sexual activity: Not on file

## 2023-11-21 ENCOUNTER — Ambulatory Visit: Payer: Medicare HMO | Admitting: Orthopaedic Surgery

## 2023-11-21 ENCOUNTER — Encounter: Payer: Self-pay | Admitting: Orthopaedic Surgery

## 2023-11-21 ENCOUNTER — Other Ambulatory Visit (INDEPENDENT_AMBULATORY_CARE_PROVIDER_SITE_OTHER): Payer: Medicare HMO

## 2023-11-21 DIAGNOSIS — Z96642 Presence of left artificial hip joint: Secondary | ICD-10-CM | POA: Diagnosis not present

## 2023-11-22 ENCOUNTER — Ambulatory Visit (HOSPITAL_COMMUNITY)
Admission: RE | Admit: 2023-11-22 | Discharge: 2023-11-22 | Disposition: A | Discharge status: Home / Self Care | Payer: Medicare HMO | Source: Ambulatory Visit | Attending: Family Medicine | Admitting: Family Medicine

## 2023-11-22 ENCOUNTER — Ambulatory Visit: Payer: Medicare HMO | Admitting: Family Medicine

## 2023-11-22 ENCOUNTER — Ambulatory Visit: Payer: Medicare HMO | Admitting: Orthopaedic Surgery

## 2023-11-22 VITALS — BP 136/78 | HR 68 | Temp 98.4°F | Ht 67.0 in | Wt 171.8 lb

## 2023-11-22 DIAGNOSIS — I511 Rupture of chordae tendineae, not elsewhere classified: Secondary | ICD-10-CM | POA: Insufficient documentation

## 2023-11-22 DIAGNOSIS — R051 Acute cough: Secondary | ICD-10-CM | POA: Diagnosis not present

## 2023-11-22 DIAGNOSIS — J189 Pneumonia, unspecified organism: Secondary | ICD-10-CM | POA: Diagnosis not present

## 2023-11-22 DIAGNOSIS — R059 Cough, unspecified: Secondary | ICD-10-CM | POA: Diagnosis not present

## 2023-11-22 HISTORY — DX: Pneumonia, unspecified organism: J18.9

## 2023-11-22 MED ORDER — AZITHROMYCIN 250 MG PO TABS: ORAL_TABLET | ORAL | 0 refills | Status: AC

## 2023-11-22 MED ORDER — AMOXICILLIN-POT CLAVULANATE 875-125 MG PO TABS: 1.0000 | ORAL_TABLET | Freq: Two times a day (BID) | ORAL | 0 refills | Status: DC

## 2023-11-22 MED ORDER — PROMETHAZINE-DM 6.25-15 MG/5ML PO SYRP: 5.0000 mL | ORAL_SOLUTION | Freq: Four times a day (QID) | ORAL | 0 refills | Status: DC | PRN

## 2023-11-22 NOTE — Assessment & Plan Note (Signed)
Chest x-ray obtained today and independently reviewed by me.  Interpretation: Left lower lobe pneumonia. Treating with Augmentin and azithromycin.  Promethazine DM for cough.

## 2023-11-22 NOTE — Progress Notes (Signed)
Subjective:  Patient ID: [name], female    DOB: 04-29-1946  Age: 77 y.o. MRN: [id number]  CC:  Cough   HPI:  77 year old female presents for evaluation of cough.  Started last Wednesday.  Cough has continued to persist and has become quite troublesome.  Causing headache.  Interfering with sleep.  No documented fever.  She states that she has heard some abnormal lung sounds at night.  No relieving factors.  No other complaints.  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   Essential hypertension, benign 09/09/2013   Hyperlipidemia 09/09/2013    Social Hx   Social History   Socioeconomic History   Marital status: Widowed    Spouse name: Not on file   Number of children: Not on file   Years of education: Not on file   Highest education level: Some college, no degree  Occupational History   Occupation: Retired Clinical biochemist  Tobacco Use   Smoking status: Never   Smokeless tobacco: Never  Vaping Use   Vaping status: Never Used  Substance and Sexual Activity   Alcohol use: No   Drug use: No   Sexual activity: Not on file  Other Topics Concern   Not on file  Social History Narrative   Not on file   Social Determinants of Health   Financial Resource Strain: Low Risk  (11/22/2023)   Overall Financial Resource Strain (CARDIA)    Difficulty of Paying Living Expenses: Not very hard  Food Insecurity: No Food Insecurity  (11/22/2023)   Hunger Vital Sign    Worried About Running Out of Food in the Last Year: Never true    Ran Out of Food in the Last Year: Never true  Transportation Needs: No Transportation Needs (11/22/2023)   PRAPARE - Administrator, Civil Service (Medical): No    Lack of Transportation (Non-Medical): No  Physical Activity: Insufficiently Active (11/22/2023)   Exercise Vital Sign    Days of Exercise per Week: 2 days    Minutes of Exercise per Session: 30 min  Stress: No Stress Concern Present (11/22/2023)   [employer] of Occupational Health - Occupational Stress Questionnaire    Feeling of Stress : Not at all  Social Connections: Moderately Integrated (11/22/2023)   Social Connection and Isolation Panel [NHANES]    Frequency of Communication with Friends and Family: More than three times a week    Frequency of Social Gatherings with Friends and Family: Three times a week    Attends Religious Services: More than 4 times per year    Active Member of Clubs or Organizations: Yes    Attends Banker Meetings: More than 4 times per year    Marital Status: Widowed    Review of Systems Per HPI  Objective:  BP 136/78   Pulse 68   Temp 98.4 F (36.9 C)   Ht 5\' 7"  (1.702 m)   Wt 171 lb 12.8 oz (77.9 kg)  SpO2 97%   BMI 26.91 kg/m      11/22/2023    3:48 PM 11/16/2023    5:00 PM 11/16/2023    4:00 PM  BP/Weight  Systolic BP 136 136 139  Diastolic BP 78 83 73  Wt. (Lbs) 171.8    BMI 26.91 kg/m2      Physical Exam Vitals and nursing note reviewed.  Constitutional:      General: She is not in acute distress.    Appearance: Normal appearance.  HENT:     Head: Normocephalic and atraumatic.  Cardiovascular:     Rate and Rhythm: Normal rate and regular rhythm.     Heart sounds: Murmur heard.  Pulmonary:     Effort: Pulmonary effort is normal. No respiratory distress.     Comments: Left basilar crackles. Neurological:     Mental Status: She  is alert.     Lab Results  Component Value Date   WBC 5.5 10/30/2023   HGB 10.9 (L) 11/16/2023   HCT 32.0 (L) 11/16/2023   PLT 188 10/30/2023   GLUCOSE 79 10/30/2023   CHOL 171 02/07/2023   TRIG 82 02/07/2023   HDL 66 02/07/2023   LDLCALC 90 02/07/2023   ALT 16 02/07/2023   AST 13 02/07/2023   NA 144 11/16/2023   K 3.4 (L) 11/16/2023   CL 110 (H) 10/30/2023   CREATININE 0.82 10/30/2023   BUN 16 10/30/2023   CO2 23 10/30/2023   TSH 2.860 02/07/2023   INR 1.0 12/19/2019     Assessment & Plan:   Problem List Items Addressed This Visit       Respiratory   CAP (community acquired pneumonia) - Primary    Chest x-ray obtained today and independently reviewed by me.  Interpretation: Left lower lobe pneumonia. Treating with Augmentin and azithromycin.  Promethazine DM for cough.      Relevant Medications   promethazine-dextromethorphan (PROMETHAZINE-DM) 6.25-15 MG/5ML syrup   amoxicillin-clavulanate (AUGMENTIN) 875-125 MG tablet   azithromycin (ZITHROMAX) 250 MG tablet   Other Visit Diagnoses     Acute cough       Relevant Orders   DG Chest 2 View (Completed)       Meds ordered this encounter  Medications   promethazine-dextromethorphan (PROMETHAZINE-DM) 6.25-15 MG/5ML syrup    Sig: Take 5 mLs by mouth 4 (four) times daily as needed for cough.    Dispense:  118 mL    Refill:  0   amoxicillin-clavulanate (AUGMENTIN) 875-125 MG tablet    Sig: Take 1 tablet by mouth 2 (two) times daily.    Dispense:  20 tablet    Refill:  0   azithromycin (ZITHROMAX) 250 MG tablet    Sig: Take 2 tablets on day 1, then 1 tablet daily on days 2 through 5    Dispense:  6 tablet    Refill:  0    Follow-up:  3-4 weeks  Sylwia Cuervo Adriana Simas DO [facility]

## 2023-11-22 NOTE — Patient Instructions (Signed)
Chest x-ray today.  I will call with results.  I have already sent in cough medication.  Will send in antibiotics accordingly.

## 2023-11-29 NOTE — Progress Notes (Unsigned)
[address]       [place] [address]             [phone]           [name] [facility] Medical Record #[id number] Date of Birth: Apr 24, 1946  Wendall Stade, MD Tommie Sams, DO  Chief Complaint:  mitral regurgitation   History of Present Illness:     Pt is a 77 yo female who was found by her PCP to have a new murmur. Echo with evidence of significant MR with normal LV function. Pt has had both knees and hips replaced and has not been able to walk fast but she is active and works as a Engineer, production still. She denies DOE but maybe because she moves slowly but recently has been troubled by a cough. She was found to have a lower lobe pneumonia and is currently under treatment. She has no fever or sputum production. Also without being able to sleep due to the cough, she has been taking cough medicine and woke up to her heart beating fast to about 140 which resolved in a few hours. 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 She had no CAD at cath and had no PHTN.       Past Medical History:  Diagnosis Date   Arthritis    Cancer [facility])    Thyroid cancer   COVID-19 virus infection 12/2020   Family history of adverse reaction to anesthesia    mother "couldn't wake up good" and would throw up   GERD (gastroesophageal reflux disease)    Heart murmur    History of kidney stones    Hypercholesteremia    resolved after gastric bypass   Hypertension    Hypothyroidism     Past Surgical History:  Procedure Laterality Date   ABDOMINAL HYSTERECTOMY     BALLOON DILATION N/A 07/17/2023   Procedure: BALLOON DILATION;  Surgeon: Lanelle Bal, DO;  Location: [facility] ENDO SUITE;  Service: Endoscopy;  Laterality: N/A;   BILATERAL SALPINGOOPHORECTOMY     BREAST EXCISIONAL BIOPSY Bilateral 1995   benign   CHOLECYSTECTOMY     COLONOSCOPY  11/14/2011   Procedure: COLONOSCOPY;  Surgeon:  Corbin Ade, MD;  Location: [facility];  Service: Endoscopy;  Laterality: N/A;   ESOPHAGOGASTRODUODENOSCOPY (EGD) WITH PROPOFOL N/A 07/17/2023   Procedure: ESOPHAGOGASTRODUODENOSCOPY (EGD) WITH L;  Surgeon: Lanelle Bal, DO;  Location: [facility]ITE;  Service: Endoscopy;  Laterality: N/A;   REPLACEMENT TOTAL KNEE BILATERAL     RIGHT/LEFT HEART CATH AND CORONARY ANGIOGRAPHY N/A 11/16/2023   Procedure: RIGHT/LEFT HEART CATH AND CORONARY ANGIOGRAPHY;  Surgeon: Orbie Pyo, MD;  Location: [facility] INVASIVE CV LAB;  Service: Cardiovascular;  Laterality: N/A;   ROUX-EN-Y PROCEDURE  2007   TEE WITHOUT CARDIOVERSION N/A 11/11/2022   Procedure: TRANSESOPHAGEAL ECHOCARDIOGRAM (TEE);  Surgeon: Marjo Bicker, MD;  Location: [facility] ORS;  Service: Cardiovascular;  Laterality: N/A;   TONSILLECTOMY     TOTAL HIP ARTHROPLASTY Right 12/23/2019   Procedure: RIGHT TOTAL HIP ARTHROPLASTY ANTERIOR APPROACH;  Surgeon: Tarry Kos, MD;  Location: [facility] OR;  Service: Orthopedics;  Laterality: Right;   TOTAL HIP ARTHROPLASTY Left 11/21/2022   Procedure: LEFT TOTAL HIP ARTHROPLASTY ANTERIOR APPROACH;  Surgeon: Tarry Kos, MD;  Location: [facility] OR;  Service: Orthopedics;  Laterality: Left;  3-C  TOTAL THYROIDECTOMY  12/2010   TRANSESOPHAGEAL ECHOCARDIOGRAM (CATH LAB) N/A 10/27/2023   Procedure: TRANSESOPHAGEAL ECHOCARDIOGRAM;  Surgeon: Wendall Stade, MD;  Location: [facility] INVASIVE CV LAB;  Service: Cardiovascular;  Laterality: N/A;   TUBAL LIGATION      Social History   Tobacco Use  Smoking Status Never  Smokeless Tobacco Never    Social History   Substance and Sexual Activity  Alcohol Use No    Social History   Socioeconomic History   Marital status: Widowed    Spouse name: Not on file   Number of children: Not on file   Years of education: Not on file   Highest education level: Some college, no degree  Occupational History   Occupation: Retired Clinical biochemist  Tobacco Use   Smoking status:  Never   Smokeless tobacco: Never  Vaping Use   Vaping status: Never Used  Substance and Sexual Activity   Alcohol use: No   Drug use: No   Sexual activity: Not on file  Other Topics Concern   Not on file  Social History Narrative   Not on file   Social Drivers of Health   Financial Resource Strain: Low Risk  (11/22/2023)   Overall Financial Resource Strain (CARDIA)    Difficulty of Paying Living Expenses: Not very hard  Food Insecurity: No Food Insecurity (11/22/2023)   Hunger Vital Sign    Worried About Running Out of Food in the Last Year: Never true    Ran Out of Food in the Last Year: Never true  Transportation Needs: No Transportation Needs (11/22/2023)   PRAPARE - Administrator, Civil Service (Medical): No    Lack of Transportation (Non-Medical): No  Physical Activity: Insufficiently Active (11/22/2023)   Exercise Vital Sign    Days of Exercise per Week: 2 days    Minutes of Exercise per Session: 30 min  Stress: No Stress Concern Present (11/22/2023)   [employer] of Occupational Health - Occupational Stress Questionnaire    Feeling of Stress : Not at all  Social Connections: Moderately Integrated (11/22/2023)   Social Connection and Isolation Panel [NHANES]    Frequency of Communication with Friends and Family: More than three times a week    Frequency of Social Gatherings with Friends and Family: Three times a week    Attends Religious Services: More than 4 times per year    Active Member of Clubs or Organizations: Yes    Attends Banker Meetings: More than 4 times per year    Marital Status: Widowed  Intimate Partner Violence: Not At Risk (11/21/2022)   Humiliation, Afraid, Rape, and Kick questionnaire    Fear of Current or Ex-Partner: No    Emotionally Abused: No    Physically Abused: No    Sexually Abused: No    Allergies  Allergen Reactions   Codeine Nausea And Vomiting   Demerol Nausea And Vomiting   Nsaids Other (See  Comments)    Due to gastric bypass    Current Outpatient Medications  Medication Sig Dispense Refill   amoxicillin (AMOXIL) 500 MG capsule Take 2,000 mg by mouth See admin instructions. Take 4 capsules (2000 mg) by mouth 1 hour prior to dental appointments     amoxicillin-clavulanate (AUGMENTIN) 875-125 MG tablet Take 1 tablet by mouth 2 (two) times daily. 20 tablet 0   Biotin 5000 MCG CAPS Take 5,000 mcg by mouth daily.     Calcium Citrate-Vitamin D (CALCIUM CITRATE+D3 PO) Take 1 tablet  by mouth daily.     carboxymethylcellulose (REFRESH PLUS) 0.5 % SOLN Place 1 drop into both eyes in the morning and at bedtime.     Cholecalciferol (VITAMIN D3 ULTRA STRENGTH) 125 MCG (5000 UT) capsule Take 5,000 Units by mouth daily.     cyanocobalamin 2000 MCG tablet Take 2,000 mcg by mouth daily.     enalapril (VASOTEC) 10 MG tablet Take 1 tablet (10 mg total) by mouth daily. 90 tablet 0   famotidine (PEPCID) 40 MG tablet TAKE 1 TABLET DAILY (CANCELRANTITIDINE) 90 tablet 1   ferrous sulfate 325 (65 FE) MG tablet Take 325 mg by mouth daily.     levothyroxine (SYNTHROID) 150 MCG tablet Take 1 tablet (150 mcg total) by mouth daily before breakfast. (Patient taking differently: Take 150 mcg by mouth See admin instructions. Take 150 mg daily except skip dose on Sundays) 30 tablet 0   Magnesium 250 MG CAPS Take 250 mg by mouth daily.     Multiple Vitamin (MULTIVITAMIN WITH MINERALS) TABS tablet Take 1 tablet by mouth in the morning and at bedtime.     promethazine-dextromethorphan (PROMETHAZINE-DM) 6.25-15 MG/5ML syrup Take 5 mLs by mouth 4 (four) times daily as needed for cough. 118 mL 0   valACYclovir (VALTREX) 1000 MG tablet TAKE 2 TABLETS AT FIRST SIGN OF FEVER BLISTER, THEN 2 TABLETS 2 HOURSLATER (Patient taking differently: Take 2,000 mg by mouth 2 (two) times daily as needed (fever blisters.).) 4 tablet 5   Vibegron (GEMTESA) 75 MG TABS Take 75 mg by mouth daily.     Zinc 50 MG TABS Take 50 mg by mouth  daily.     No current facility-administered medications for this visit.     Family History  Problem Relation Age of Onset   Breast cancer Mother    Pneumonia Mother 54   CAD Father 55   Breast cancer Sister    CAD Sister 56   Other Maternal Grandmother 74   CAD Brother 33   Other Brother 58       drug overdose   CAD Other 32   Colon cancer Neg Hx        Physical Exam: Frail appearing Lungs; Clear Card: RR with 3/6 sem Ext: no edema Neuro: intact     Diagnostic Studies & Laboratory data: I have personally reviewed the following studies and agree with the findings   TTE (10/2023) IMPRESSIONS     1. Left ventricular ejection fraction, by estimation, is 60 to 65%. The  left ventricle has normal function. The left ventricle has no regional  wall motion abnormalities. Left ventricular diastolic parameters are  consistent with Grade II diastolic  dysfunction (pseudonormalization). The average left ventricular global  longitudinal strain is -29.1 %.   2. Right ventricular systolic function is mildly reduced. The right  ventricular size is normal.   3. Left atrial size was severely dilated.   4. Mitral valve leaflets are not adequately visualized. There appears to  be a mobile echodensity 0.59 x 0.89 cm attached possibly to the anterior  mitral valve leaflet by a stalk and is noted on the atrial side during  systole. Differential diagnoses  include chordal rupture, flail mitral valve leaflet, tumor, thrombus,  vegetation. Clinical correlation is recommended. Recommend TEE to further  evaluate the mitral valve pathology. Moderate eccentric anteriorly  directed mitral valve regurgitation. No  evidence of mitral stenosis.   5. The aortic valve was not well visualized. Aortic valve regurgitation  is not visualized.  No aortic stenosis is present.   6. The inferior vena cava is normal in size with greater than 50%  respiratory variability, suggesting right atrial pressure  of 3 mmHg.   Comparison(s): No prior Echocardiogram.   FINDINGS   Left Ventricle: Left ventricular ejection fraction, by estimation, is 60  to 65%. The left ventricle has normal function. The left ventricle has no  regional wall motion abnormalities. The average left ventricular global  longitudinal strain is -29.1 %.  The left ventricular internal cavity size was normal in size. There is no  left ventricular hypertrophy. Left ventricular diastolic parameters are  consistent with Grade II diastolic dysfunction (pseudonormalization).   Right Ventricle: The right ventricular size is normal. No increase in  right ventricular wall thickness. Right ventricular systolic function is  mildly reduced.   Left Atrium: Left atrial size was severely dilated.   Right Atrium: Right atrial size was normal in size.   Pericardium: There is no evidence of pericardial effusion.   Mitral Valve: Mitral valve leaflets are not adequately visualized. There  appears to be a mobile echodensity 0.59 x 0.89 cm attached possibly to the  anterior mitral valve leaflet by a stalk and is noted on the atrial side  during systole. Differential  diagnoses include chordal rupture, flail mitral valve leaflet, tumor,  thrombus, vegetation. Clinical correlation is recommended. Recommend TEE  to further evaluate the mitral valve pathology. Moderate eccentric  anteriorly directed mitral valve  regurgitation. No evidence of mitral valve stenosis.   Tricuspid Valve: The tricuspid valve is not well visualized. Tricuspid  valve regurgitation is not demonstrated. No evidence of tricuspid  stenosis.   Aortic Valve: The aortic valve was not well visualized. Aortic valve  regurgitation is not visualized. No aortic stenosis is present. Aortic  valve mean gradient measures 5.0 mmHg. Aortic valve peak gradient measures  11.4 mmHg. Aortic valve area, by VTI  measures 2.26 cm.   Pulmonic Valve: The pulmonic valve was not well  visualized. Pulmonic valve  regurgitation is not visualized. No evidence of pulmonic stenosis.   Aorta: The aortic root is normal in size and structure.   Venous: The inferior vena cava is normal in size with greater than 50%  respiratory variability, suggesting right atrial pressure of 3 mmHg.   IAS/Shunts: No atrial level shunt detected by color flow Doppler.     LEFT VENTRICLE  PLAX 2D  LVIDd:         5.40 cm      Diastology  LVIDs:         3.20 cm      LV e' medial:    7.29 cm/s  LV PW:         0.80 cm      LV E/e' medial:  18.0  LV IVS:        0.80 cm      LV e' lateral:   9.14 cm/s  LVOT diam:     1.90 cm      LV E/e' lateral: 14.3  LV SV:         77  LV SV Index:   40           2D Longitudinal Strain  LVOT Area:     2.84 cm     2D Strain GLS (A2C):   -21.4 %  2D Strain GLS (A3C):   -34.5 %                              2D Strain GLS (A4C):   -31.4 %  LV Volumes (MOD)            2D Strain GLS Avg:     -29.1 %  LV vol d, MOD A2C: 104.0 ml  LV vol d, MOD A4C: 149.0 ml  LV vol s, MOD A2C: 39.6 ml  LV vol s, MOD A4C: 54.9 ml  LV SV MOD A2C:     64.4 ml  LV SV MOD A4C:     149.0 ml  LV SV MOD BP:      80.5 ml   RIGHT VENTRICLE  RV S prime:     11.70 cm/s  TAPSE (M-mode): 2.1 cm   LEFT ATRIUM           Index        RIGHT ATRIUM           Index  LA diam:      4.60 cm 2.42 cm/m   RA Area:     15.80 cm  LA Vol (A4C): 96.9 ml 50.99 ml/m  RA Volume:   37.70 ml  19.84 ml/m   AORTIC VALVE  AV Area (Vmax):    2.52 cm  AV Area (Vmean):   2.73 cm  AV Area (VTI):     2.26 cm  AV Vmax:           168.50 cm/s  AV Vmean:          106.000 cm/s  AV VTI:            0.339 m  AV Peak Grad:      11.4 mmHg  AV Mean Grad:      5.0 mmHg  LVOT Vmax:         150.00 cm/s  LVOT Vmean:        102.000 cm/s  LVOT VTI:          0.270 m  LVOT/AV VTI ratio: 0.80    AORTA  Ao Root diam: 3.30 cm   MITRAL VALVE                TRICUSPID VALVE  MV Area (PHT): 3.34  cm     TR Peak grad:   33.6 mmHg  MV Decel Time: 227 msec     TR Vmax:        290.00 cm/s  MR Peak grad: 97.9 mmHg  MR Mean grad: 113.5 mmHg    SHUNTS  MR Vmax:      494.67 cm/s   Systemic VTI:  0.27 m  MR Vmean:     508.5 cm/s    Systemic Diam: 1.90 cm  MV E velocity: 131.00 cm/s  MV A velocity: 62.40 cm/s  MV E/A ratio:  2.10   TEE (10/2023) IMPRESSIONS     1. Left ventricular ejection fraction, by estimation, is 55 to 60%. The  left ventricle has normal function.   2. Right ventricular systolic function is normal. The right ventricular  size is normal.   3. Left atrial size was moderately dilated. No left atrial/left atrial  appendage thrombus was detected.   4. Right atrial size was mildly dilated.   5. 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  The mitral valve is myxomatous. Severe   mitral valve regurgitation.   6. The aortic valve is tricuspid. There is mild calcification of the  aortic valve. Aortic valve regurgitation is not visualized. Aortic valve  sclerosis is present, with no evidence of aortic valve stenosis.   FINDINGS   Left Ventricle: Left ventricular ejection fraction, by estimation, is 55  to 60%. The left ventricle has normal function. The left ventricular  internal cavity size was normal in size.   Right Ventricle: The right ventricular size is normal. No increase in  right ventricular wall thickness. Right ventricular systolic function is  normal.   Left Atrium: Left atrial size was moderately dilated. No left atrial/left  atrial appendage thrombus was detected.   Right Atrium: Right atrial size was mildly dilated.   Pericardium: There is no evidence of pericardial effusion.   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 The mitral valve is  myxomatous. Severe mitral valve regurgitation. MV peak gradient, 6.0 mmHg.  The mean mitral valve gradient is 2.0 mmHg.   Tricuspid Valve: The tricuspid valve is normal in structure. Tricuspid  valve regurgitation is mild.   Aortic Valve: The aortic valve is tricuspid. There is mild calcification  of the aortic valve. Aortic valve regurgitation is not visualized. Aortic  valve sclerosis is present, with no evidence of aortic valve stenosis.   Pulmonic Valve: The pulmonic valve was normal in structure. Pulmonic valve  regurgitation is mild.   Aorta: The aortic root is normal in size and structure.   IAS/Shunts: No atrial level shunt detected by color flow Doppler.     MITRAL VALVE  MV Area (plan): 6.04 cm  MV Peak grad:   6.0 mmHg  MV Mean grad:   2.0 mmHg  MV Vmax:        1.22 m/s  MV Vmean:       66.2 cm/s  MR Peak grad:    68.2 mmHg  MR Mean grad:    50.0 mmHg  MR Vmax:         413.00 cm/s  MR Vmean:        343.0 cm/s  MR PISA:         14.14 cm  MR PISA Eff ROA: 123 mm  MR PISA Radius:  1.50 cm   CATH (11/2023) Conclusion  1.  Minimal nonobstructive coronary artery disease. 2.  Fick cardiac output of 7.2 L/min and Fick cardiac index of 3.8 L/min/m with the following hemodynamics:             Right atrial pressure mean of 6 mmHg             Right ventricular pressure 33/1 with an end-diastolic pressure of 11 mmHg             Wedge pressure mean of 13 mmHg with V waves to 18 mmHg             PA pressure 34/9 with a mean of 21 mmHg             PVR of 1.1 Woods units             PA pulsatility  index of over 4 3.  LVEDP of 12 mmHg.  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 She would benefit from mitral valve repair. She with her age, frailty and what she says takes a lot to recover from procedures I feel now is at increased risk for open mitral valve repair >6% and would be better served with mitral clip and will let Dr Lynnette Caffey know to proceed with scheduling. She may need afterload reduction/diuretic therapy prior if cough and afib become more an issue prior.    I have spent 60 min in review of the records, viewing studies and in face to face with patient and in coordination of future care    Eugenio Hoes 11/29/2023 4:24 PM

## 2023-11-30 ENCOUNTER — Encounter: Payer: Self-pay | Admitting: Thoracic Surgery (Cardiothoracic Vascular Surgery)

## 2023-11-30 ENCOUNTER — Institutional Professional Consult (permissible substitution): Payer: Medicare HMO | Admitting: Thoracic Surgery (Cardiothoracic Vascular Surgery)

## 2023-11-30 VITALS — BP 150/80 | HR 74 | Resp 20 | Ht 67.0 in | Wt 165.0 lb

## 2023-11-30 DIAGNOSIS — I340 Nonrheumatic mitral (valve) insufficiency: Secondary | ICD-10-CM

## 2023-12-19 ENCOUNTER — Telehealth: Payer: Self-pay | Admitting: Cardiology

## 2023-12-19 ENCOUNTER — Other Ambulatory Visit: Payer: Self-pay | Admitting: Cardiology

## 2023-12-19 DIAGNOSIS — R002 Palpitations: Secondary | ICD-10-CM

## 2023-12-19 MED ORDER — METOPROLOL TARTRATE 25 MG PO TABS: 25.0000 mg | ORAL_TABLET | ORAL | 11 refills | Status: DC | PRN

## 2023-12-19 NOTE — Telephone Encounter (Signed)
 Discussed mTEER with structural heart team this morning with plans to proceed with MitraClip on 01/03/24. Called the patient to inform her of the discussion. She has been scheduled for pre-procedure evaluation 12/26/23 at 11:30AM.   During our discussion, she reported having a second episode of tachyrhythmia which woke her from sleep this AM. HR's nearing 160bpm and BP around 182/103. Episode lasted 3 hours then resolved without intervention. She has had one other episode which was reported and noted during her visit with Dr. Maryjane. Patient reports her Apple watch is calling arrhythmia atrial fibrillation. There is no clear documentation of this however concerning. I have sent metoprolol  25mg  PRN to her pharmacy for now. Will reach out to Dr. Wendel regarding long term plan with [facility]. Consider ZIO however this will not be finalized prior to mTEER on 1/22.

## 2023-12-20 ENCOUNTER — Ambulatory Visit: Payer: Medicare HMO | Attending: Cardiology

## 2023-12-20 DIAGNOSIS — R002 Palpitations: Secondary | ICD-10-CM | POA: Diagnosis not present

## 2023-12-20 NOTE — Progress Notes (Unsigned)
 ZIO serial # [id number] from office inventory applied to patient.  Dr. Lynnette Caffey to read.

## 2023-12-25 ENCOUNTER — Other Ambulatory Visit: Payer: Self-pay

## 2023-12-25 DIAGNOSIS — I340 Nonrheumatic mitral (valve) insufficiency: Secondary | ICD-10-CM

## 2023-12-26 ENCOUNTER — Ambulatory Visit: Payer: Medicare HMO | Attending: Cardiology | Admitting: Cardiology

## 2023-12-26 VITALS — BP 142/84 | HR 60 | Ht 67.0 in | Wt 166.6 lb

## 2023-12-26 DIAGNOSIS — I511 Rupture of chordae tendineae, not elsewhere classified: Secondary | ICD-10-CM

## 2023-12-26 DIAGNOSIS — I10 Essential (primary) hypertension: Secondary | ICD-10-CM

## 2023-12-26 DIAGNOSIS — R002 Palpitations: Secondary | ICD-10-CM

## 2023-12-26 DIAGNOSIS — Z01818 Encounter for other preprocedural examination: Secondary | ICD-10-CM

## 2023-12-26 DIAGNOSIS — I340 Nonrheumatic mitral (valve) insufficiency: Secondary | ICD-10-CM

## 2023-12-26 LAB — CBC

## 2023-12-26 NOTE — H&P (View-Only) (Signed)
 HEART AND VASCULAR CENTER   MULTIDISCIPLINARY HEART VALVE TEAM  Structural Heart Office Note:  .    Date:  12/26/2023  ID:  [name], DOB 06-May-1946, MRN [id number] PCP: Tommie Sams, DO  [facility] Providers Cardiologist:  Dietrich Pates, MD  History of Present Illness: Marland Kitchen   [name] is a 78 y.o. female with past medical history of mitral valve regurgitation, HTN and history of thyroid cancer who presented today for pre mTEER evaluation.   [name] is followed by Dr. Tenny Craw for her cardiology care. Previous TEE had a mobile echodensity attached possibly to the anterior mitral valve leaflet and was also noted to have moderate eccentric anteriorly directed mitral valve regurgitation. Plan was for repeat TEE. This was attempted in 11/2022 but unsuccessful due to inability to advance the probe. At the time of her visit, it was recommended that she keep follow-up with GI for further evaluation of her esophagus and TEE could be performed afterwards. She was seen by GI and had an EGD 07/2023 which showed no obvious stricture in the oropharynx but was slightly torturous. 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 Subsequent mTEER workup with R/LHC with no obstructive CAD.   Patient called to discuss procedure scheduling with complaints of intermittent tachyrhythmia which woke her from sleep. HR's nearing 160bpm and BP around 182/103. Episode lasted 3 hours then resolved without intervention. Patient reported her Apple watch is calling arrhythmia atrial fibrillation. There is no clear documentation of this however concerning. She was sent PRN metoprolol 25mg  for breakthrough episodes and outpatient ZIO placed.   Today she is here and reports no further arrhthymia episodes since  monitor was placed. Otherwise she is feeling well and ready for her procedure. She denies chest pain, SOB, palpitations, LE edema, orthopnea, PND, dizziness, or syncope. Denies bleeding in stool or urine.    Physical Exam:   VS:  BP (!) 142/84   Pulse 60   Ht 5\' 7"  (1.702 m)   Wt 166 lb 9.6 oz (75.6 kg)   SpO2 99%   BMI 26.09 kg/m    Wt Readings from Last 3 Encounters:  12/26/23 166 lb 9.6 oz (75.6 kg)  11/30/23 165 lb (74.8 kg)  11/22/23 171 lb 12.8 oz (77.9 kg)    General: Well developed, well nourished, NAD Skin: Warm, dry, intact  Lungs:Clear to ausculation bilaterally. No wheezes, rales, or rhonchi. Breathing is unlabored. Cardiovascular: RRR with S1 S2. + murmur Extremities: No edema.  Neuro: Alert and oriented. No focal deficits. No facial asymmetry. MAE spontaneously. Psych: Responds to questions appropriately with normal affect.    ASSESSMENT AND PLAN: .    Severe MR: Noted on TEE and referred for [facility] consult. She is now scheduled for mTEER 01/03/24 with Dr. Lynnette Caffey Patient doing well with NYHA class I symptoms today. Instruction letter reviewed. CHG soap given. Obtain CMET, CBC, INR, BNP today.   Tachyarrhythmia: Two episodes of symptomatic tachycardia with rates into the 160s. Unclear if this is SVT vs AF. Currently wearing 5 day ZIO>>to be mailed back 1/15. Follow results and telemetry while hospitalized for plan. No recurrence since monitor placement. Has not needed PRN metoprolol.   HTN: Mildly elevated today. Plan to watch while hospitalized and will titrate then if needed.  I spent 25 minutes caring for this patient today including face-to-face discussions, ordering and reviewing labs, reviewing records from [facility] and other outside facilities, documenting in the record, and arranging for follow up.    Signed, Georgie Chard, NP

## 2023-12-26 NOTE — Progress Notes (Signed)
 [facility]   MULTIDISCIPLINARY HEART VALVE TEAM  Structural Heart Office Note:  .    Date:  12/26/2023  ID:  [name], DOB 01/02/1946, MRN [id number] PCP: Bluford Jacqulyn MATSU, DO  [facility] Providers Cardiologist:  Vina Gull, MD  History of Present Illness: [name]   [name] is a 78 y.o. female with past medical history of mitral valve regurgitation, HTN and history of thyroid  cancer who presented today for pre mTEER evaluation.   [name] is followed by Dr. Gull for her cardiology care. Previous TEE had a mobile echodensity attached possibly to the anterior mitral valve leaflet and was also noted to have moderate eccentric anteriorly directed mitral valve regurgitation. Plan was for repeat TEE. This was attempted in 11/2022 but unsuccessful due to inability to advance the probe. At the time of her visit, it was recommended that she keep follow-up with GI for further evaluation of her esophagus and TEE could be performed afterwards. She was seen by GI and had an EGD 07/2023 which showed no obvious stricture in the oropharynx but was slightly torturous. 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 Subsequent mTEER workup with R/LHC with no obstructive CAD.   Patient called to discuss procedure scheduling with complaints of intermittent tachyrhythmia which woke her from sleep. HR's nearing 160bpm and BP around 182/103. Episode lasted 3 hours then resolved without intervention. Patient reported her Apple watch is calling arrhythmia atrial fibrillation. There is no clear documentation of this however concerning. She was sent PRN metoprolol  25mg  for breakthrough episodes and outpatient ZIO placed.   Today she is here and reports no further arrhthymia episodes since  monitor was placed. Otherwise she is feeling well and ready for her procedure. She denies chest pain, SOB, palpitations, LE edema, orthopnea, PND, dizziness, or syncope. Denies bleeding in stool or urine.    Physical Exam:   VS:  BP (!) 142/84   Pulse 60   Ht 5' 7 (1.702 m)   Wt 166 lb 9.6 oz (75.6 kg)   SpO2 99%   BMI 26.09 kg/m    Wt Readings from Last 3 Encounters:  12/26/23 166 lb 9.6 oz (75.6 kg)  11/30/23 165 lb (74.8 kg)  11/22/23 171 lb 12.8 oz (77.9 kg)    General: Well developed, well nourished, NAD Skin: Warm, dry, intact  Lungs:Clear to ausculation bilaterally. No wheezes, rales, or rhonchi. Breathing is unlabored. Cardiovascular: RRR with S1 S2. + murmur Extremities: No edema.  Neuro: Alert and oriented. No focal deficits. No facial asymmetry. MAE spontaneously. Psych: Responds to questions appropriately with normal affect.    ASSESSMENT AND PLAN: .    Severe MR: Noted on TEE and referred for [facility] consult. She is now scheduled for mTEER 01/03/24 with Dr. Fermin Patient doing well with NYHA class I symptoms today. Instruction letter reviewed. CHG soap given. Obtain CMET, CBC, INR, BNP today.   Tachyarrhythmia: Two episodes of symptomatic tachycardia with rates into the 160s. Unclear if this is SVT vs AF. Currently wearing 5 day ZIO>>to be mailed back 1/15. Follow results and telemetry while hospitalized for plan. No recurrence since monitor placement. Has not needed PRN metoprolol .   HTN: Mildly elevated today. Plan to watch while hospitalized and will titrate then if needed.  I spent 25 minutes caring for this patient today including face-to-face discussions, ordering and reviewing labs, reviewing records from [facility] and other outside facilities, documenting in the record, and arranging for follow up.    Signed, Kate Minus, NP

## 2023-12-26 NOTE — Patient Instructions (Signed)
 Medication Instructions:  Your physician recommends that you continue on your current medications as directed. Please refer to the Current Medication list given to you today.  *If you need a refill on your cardiac medications before your next appointment, please call your pharmacy*   Lab Work: CMET, CBC, PT/INR, PRO BNP - TODAY   If you have labs (blood work) drawn today and your tests are completely normal, you will receive your results only by: MyChart Message (if you have MyChart) OR A paper copy in the mail If you have any lab test that is abnormal or we need to change your treatment, we will call you to review the results.   Testing/Procedures: You are scheduled for a mitral transcatheter edge-to-edge repair on Wednesday, January 22 with Dr. Lurena Red.   Follow-Up: Follow up will be scheduled during your hospital stay   Other Instructions

## 2023-12-27 ENCOUNTER — Ambulatory Visit: Payer: Medicare HMO

## 2023-12-27 LAB — COMPREHENSIVE METABOLIC PANEL
ALT: 26 [IU]/L (ref 0–32)
AST: 18 [IU]/L (ref 0–40)
Albumin: 4.2 g/dL (ref 3.8–4.8)
Alkaline Phosphatase: 76 [IU]/L (ref 44–121)
BUN/Creatinine Ratio: 15 (ref 12–28)
BUN: 15 mg/dL (ref 8–27)
Bilirubin Total: 0.6 mg/dL (ref 0.0–1.2)
CO2: 20 mmol/L (ref 20–29)
Calcium: 9.2 mg/dL (ref 8.7–10.3)
Chloride: 108 mmol/L — ABNORMAL HIGH (ref 96–106)
Creatinine, Ser: 1 mg/dL (ref 0.57–1.00)
Globulin, Total: 2.2 g/dL (ref 1.5–4.5)
Glucose: 87 mg/dL (ref 70–99)
Potassium: 3.9 mmol/L (ref 3.5–5.2)
Sodium: 142 mmol/L (ref 134–144)
Total Protein: 6.4 g/dL (ref 6.0–8.5)
eGFR: 58 mL/min/{1.73_m2} — ABNORMAL LOW (ref >59)

## 2023-12-27 LAB — CBC
Hematocrit: 40.5 % (ref 34.0–46.6)
Hemoglobin: 13.3 g/dL (ref 11.1–15.9)
MCH: 31.1 pg (ref 26.6–33.0)
MCHC: 32.8 g/dL (ref 31.5–35.7)
MCV: 95 fL (ref 79–97)
Platelets: 180 10*3/uL (ref 150–450)
RBC: 4.27 x10E6/uL (ref 3.77–5.28)
RDW: 13.4 % (ref 11.7–15.4)
WBC: 5.1 10*3/uL (ref 3.4–10.8)

## 2023-12-27 LAB — PROTIME-INR
INR: 1 (ref 0.9–1.2)
Prothrombin Time: 11.6 s (ref 9.1–12.0)

## 2023-12-27 LAB — PRO B NATRIURETIC PEPTIDE: NT-Pro BNP: 1198 pg/mL — ABNORMAL HIGH (ref 0–738)

## 2023-12-28 ENCOUNTER — Encounter: Payer: Medicare HMO | Admitting: Thoracic Surgery (Cardiothoracic Vascular Surgery)

## 2024-01-01 ENCOUNTER — Telehealth: Payer: Self-pay

## 2024-01-01 NOTE — Telephone Encounter (Signed)
Confirmed new arrival/procedure times with patient. Sent updated instructions via MyChart for viewing. All questions answered. The patient was grateful for call and agreed with plan.

## 2024-01-01 NOTE — Telephone Encounter (Signed)
Due to cancellation, called to inform patient her arrival time for MitraClip procedure 1/22 has changed.  She will now need to arrive at 0630 for 0830 procedure.  Left message to call back to confirm.

## 2024-01-02 ENCOUNTER — Other Ambulatory Visit: Payer: Self-pay

## 2024-01-02 ENCOUNTER — Encounter (HOSPITAL_COMMUNITY): Payer: Self-pay | Admitting: Internal Medicine

## 2024-01-02 DIAGNOSIS — R002 Palpitations: Secondary | ICD-10-CM | POA: Diagnosis not present

## 2024-01-02 NOTE — Progress Notes (Signed)
PCP - Dr. Everlene Other Cardiologist - Dr. Alverda Skeans   PPM/ICD - denies   Chest x-ray - DOS EKG - 12/26/23 Stress Test - 12/29/16 ECHO - 10/27/23 Cardiac Cath - 11/16/23  CPAP - denies  DM- denies  ASA/Blood Thinner Instructions: n/a   ERAS Protcol - no, NPO  COVID TEST- n/a  Anesthesia review: yes, cardiac hx  Patient verbally denies any shortness of breath, fever, cough and chest pain during phone call

## 2024-01-02 NOTE — Pre-Procedure Instructions (Signed)
-------------    SDW INSTRUCTIONS given:  Your procedure is scheduled on 01/03/24.  Report to [facility] Main Entrance "A" at 06:30 A.M., and check in at the Admitting office.  Any questions or running late day of surgery: call [phone]    Remember:  Do not eat or drink after midnight the night before your surgery      Take these medicines the morning of surgery with A SIP OF WATER: NONE    As of today, STOP taking any Aspirin (unless otherwise instructed by your surgeon) Aleve, Naproxen, Ibuprofen, Motrin, Advil, Goody's, BC's, all herbal medications, fish oil, and all vitamins.   Do NOT Smoke (Tobacco/Vaping) 24 hours prior to your procedure  If you use a CPAP at night, you may bring all equipment for your overnight stay.     You will be asked to remove any contacts, glasses, piercing's, hearing aid's, dentures/partials prior to surgery. Please bring cases for these items if needed.     Patients discharged the day of surgery will not be allowed to drive home, and someone needs to stay with them for 24 hours.  SURGICAL WAITING ROOM VISITATION Patients may have no more than 2 support people in the waiting area - these visitors may rotate.   Pre-op nurse will coordinate an appropriate time for 2 ADULT support persons, who may not rotate, to accompany patient in pre-op.  Children under the age of 17 must have an adult with them who is not the patient and must remain in the main waiting area with an adult.  If the patient needs to stay at the hospital during part of their recovery, the visitor guidelines for inpatient rooms apply.  Please refer to the [facility] website for the visitor guidelines for any additional information.   Special instructions:   [facility]- Preparing For Surgery   Please follow these instructions carefully.   Shower the NIGHT BEFORE SURGERY and the MORNING OF SURGERY with CHG Soap.   Pat yourself dry with a CLEAN TOWEL.  Wear CLEAN PAJAMAS to  bed the night before surgery  Place CLEAN SHEETS on your bed the night of your first shower and DO NOT SLEEP WITH PETS.   Additional instructions for the day of surgery: DO NOT APPLY any lotions, deodorants, cologne, or perfumes.   Do not wear jewelry or makeup Do not wear nail polish, gel polish, artificial nails, or any other type of covering on natural nails (fingers and toes) Do not bring valuables to the hospital. [facility] is not responsible for valuables/personal belongings. Put on clean/comfortable clothes.  Please brush your teeth.  Ask your nurse before applying any prescription medications to the skin.    Questions were answered. Patient verbalized understanding of instructions.

## 2024-01-02 NOTE — Anesthesia Preprocedure Evaluation (Signed)
Anesthesia Evaluation  Patient identified by MRN, date of birth, ID band Patient awake    Reviewed: Allergy & Precautions, NPO status , Patient's Chart, lab work & pertinent test results  History of Anesthesia Complications (+) Family history of anesthesia reaction  Airway Mallampati: II  TM Distance: >3 FB Neck ROM: Full    Dental  (+) Dental Advisory Given, Teeth Intact, Missing,    Pulmonary pneumonia   Pulmonary exam normal breath sounds clear to auscultation       Cardiovascular hypertension, Pt. on medications + Valvular Problems/Murmurs MR  Rhythm:Regular Rate:Normal + Systolic murmurs LHC 11/2023 1.  Minimal nonobstructive coronary artery disease. 2.  Fick cardiac output of 7.2 L/min and Fick cardiac index of 3.8 L/min/m with the following hemodynamics:             Right atrial pressure mean of 6 mmHg             Right ventricular pressure 33/1 with an end-diastolic pressure of 11 mmHg             Wedge pressure mean of 13 mmHg with V waves to 18 mmHg             PA pressure 34/9 with a mean of 21 mmHg             PVR of 1.1 Woods units             PA pulsatility index of over 4 3.  LVEDP of 12 mmHg.   Echo 10/2023  1. Left ventricular ejection fraction, by estimation, is 55 to 60%. The left ventricle has normal function.   2. Right ventricular systolic function is normal. The right ventricular size is normal.   3. Left atrial size was moderately dilated. No left atrial/left atrial appendage thrombus was detected.   4. Right atrial size was mildly dilated.   5. 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 The mitral valve is myxomatous. Severe mitral valve regurgitation.   6. The aortic valve is tricuspid. There is mild calcification of the aortic valve.  Aortic valve regurgitation is not visualized. Aortic valve sclerosis is present, with no evidence of aortic valve stenosis.      Neuro/Psych    GI/Hepatic ,GERD  ,,  Endo/Other  Hypothyroidism    Renal/GU      Musculoskeletal  (+) Arthritis ,    Abdominal   Peds  Hematology   Anesthesia Other Findings   Reproductive/Obstetrics                             Anesthesia Physical Anesthesia Plan  ASA: 4  Anesthesia Plan: General   Post-op Pain Management: Tylenol PO (pre-op)*   Induction: Intravenous  PONV Risk Score and Plan: 3 and Ondansetron, Dexamethasone and Treatment may vary due to age or medical condition  Airway Management Planned: Oral ETT  Additional Equipment: Arterial line and ClearSight  Intra-op Plan:   Post-operative Plan: Possible Post-op intubation/ventilation  Informed Consent: I have reviewed the patients History and Physical, chart, labs and discussed the procedure including the risks, benefits and alternatives for the proposed anesthesia with the patient or authorized representative who has indicated his/her understanding and acceptance.     Dental advisory given  Plan Discussed with: CRNA  Anesthesia Plan Comments:        Anesthesia Quick Evaluation

## 2024-01-03 ENCOUNTER — Inpatient Hospital Stay (HOSPITAL_COMMUNITY): Payer: Medicare HMO

## 2024-01-03 ENCOUNTER — Ambulatory Visit: Payer: Medicare HMO | Admitting: Urology

## 2024-01-03 ENCOUNTER — Other Ambulatory Visit: Payer: Self-pay

## 2024-01-03 ENCOUNTER — Inpatient Hospital Stay (HOSPITAL_COMMUNITY): Payer: Medicare HMO | Admitting: Certified Registered Nurse Anesthetist

## 2024-01-03 ENCOUNTER — Inpatient Hospital Stay (HOSPITAL_COMMUNITY)
Admission: RE | Admit: 2024-01-03 | Discharge: 2024-01-05 | DRG: 266 | Disposition: A | Discharge status: Home / Self Care | Payer: Medicare HMO | Attending: Internal Medicine | Admitting: Internal Medicine

## 2024-01-03 ENCOUNTER — Encounter (HOSPITAL_COMMUNITY): Payer: Self-pay | Admitting: Internal Medicine

## 2024-01-03 ENCOUNTER — Inpatient Hospital Stay (HOSPITAL_COMMUNITY): Payer: Self-pay | Admitting: Certified Registered Nurse Anesthetist

## 2024-01-03 ENCOUNTER — Encounter (HOSPITAL_COMMUNITY)
Admission: RE | Disposition: A | Discharge status: Home / Self Care | Payer: Medicare HMO | Source: Home / Self Care | Attending: Internal Medicine

## 2024-01-03 DIAGNOSIS — K118 Other diseases of salivary glands: Secondary | ICD-10-CM | POA: Insufficient documentation

## 2024-01-03 DIAGNOSIS — E785 Hyperlipidemia, unspecified: Secondary | ICD-10-CM | POA: Diagnosis not present

## 2024-01-03 DIAGNOSIS — Z885 Allergy status to narcotic agent status: Secondary | ICD-10-CM | POA: Diagnosis not present

## 2024-01-03 DIAGNOSIS — I480 Paroxysmal atrial fibrillation: Secondary | ICD-10-CM | POA: Diagnosis present

## 2024-01-03 DIAGNOSIS — G453 Amaurosis fugax: Secondary | ICD-10-CM | POA: Diagnosis not present

## 2024-01-03 DIAGNOSIS — Z79899 Other long term (current) drug therapy: Secondary | ICD-10-CM

## 2024-01-03 DIAGNOSIS — E7800 Pure hypercholesterolemia, unspecified: Secondary | ICD-10-CM | POA: Diagnosis present

## 2024-01-03 DIAGNOSIS — Z96653 Presence of artificial knee joint, bilateral: Secondary | ICD-10-CM | POA: Diagnosis not present

## 2024-01-03 DIAGNOSIS — I6340 Cerebral infarction due to embolism of unspecified cerebral artery: Secondary | ICD-10-CM | POA: Diagnosis not present

## 2024-01-03 DIAGNOSIS — Z803 Family history of malignant neoplasm of breast: Secondary | ICD-10-CM

## 2024-01-03 DIAGNOSIS — I511 Rupture of chordae tendineae, not elsewhere classified: Secondary | ICD-10-CM | POA: Diagnosis present

## 2024-01-03 DIAGNOSIS — Z9889 Other specified postprocedural states: Principal | ICD-10-CM

## 2024-01-03 DIAGNOSIS — R29701 NIHSS score 1: Secondary | ICD-10-CM | POA: Diagnosis not present

## 2024-01-03 DIAGNOSIS — I700 Atherosclerosis of aorta: Secondary | ICD-10-CM | POA: Diagnosis not present

## 2024-01-03 DIAGNOSIS — Z7902 Long term (current) use of antithrombotics/antiplatelets: Secondary | ICD-10-CM | POA: Diagnosis not present

## 2024-01-03 DIAGNOSIS — Z886 Allergy status to analgesic agent status: Secondary | ICD-10-CM | POA: Diagnosis not present

## 2024-01-03 DIAGNOSIS — H53122 Transient visual loss, left eye: Secondary | ICD-10-CM | POA: Insufficient documentation

## 2024-01-03 DIAGNOSIS — R221 Localized swelling, mass and lump, neck: Secondary | ICD-10-CM | POA: Diagnosis not present

## 2024-01-03 DIAGNOSIS — Z95818 Presence of other cardiac implants and grafts: Principal | ICD-10-CM

## 2024-01-03 DIAGNOSIS — R930 Abnormal findings on diagnostic imaging of skull and head, not elsewhere classified: Secondary | ICD-10-CM | POA: Diagnosis not present

## 2024-01-03 DIAGNOSIS — K219 Gastro-esophageal reflux disease without esophagitis: Secondary | ICD-10-CM | POA: Diagnosis not present

## 2024-01-03 DIAGNOSIS — E039 Hypothyroidism, unspecified: Secondary | ICD-10-CM

## 2024-01-03 DIAGNOSIS — Z7982 Long term (current) use of aspirin: Secondary | ICD-10-CM

## 2024-01-03 DIAGNOSIS — Z96643 Presence of artificial hip joint, bilateral: Secondary | ICD-10-CM | POA: Diagnosis not present

## 2024-01-03 DIAGNOSIS — I10 Essential (primary) hypertension: Secondary | ICD-10-CM | POA: Diagnosis present

## 2024-01-03 DIAGNOSIS — I639 Cerebral infarction, unspecified: Secondary | ICD-10-CM | POA: Insufficient documentation

## 2024-01-03 DIAGNOSIS — I63413 Cerebral infarction due to embolism of bilateral middle cerebral arteries: Secondary | ICD-10-CM | POA: Diagnosis not present

## 2024-01-03 DIAGNOSIS — I771 Stricture of artery: Secondary | ICD-10-CM | POA: Diagnosis not present

## 2024-01-03 DIAGNOSIS — Z85850 Personal history of malignant neoplasm of thyroid: Secondary | ICD-10-CM | POA: Diagnosis not present

## 2024-01-03 DIAGNOSIS — I340 Nonrheumatic mitral (valve) insufficiency: Principal | ICD-10-CM | POA: Diagnosis present

## 2024-01-03 DIAGNOSIS — E890 Postprocedural hypothyroidism: Secondary | ICD-10-CM | POA: Diagnosis present

## 2024-01-03 DIAGNOSIS — Z9884 Bariatric surgery status: Secondary | ICD-10-CM | POA: Diagnosis not present

## 2024-01-03 DIAGNOSIS — Z006 Encounter for examination for normal comparison and control in clinical research program: Secondary | ICD-10-CM | POA: Diagnosis not present

## 2024-01-03 DIAGNOSIS — Z8249 Family history of ischemic heart disease and other diseases of the circulatory system: Secondary | ICD-10-CM | POA: Diagnosis not present

## 2024-01-03 DIAGNOSIS — I471 Supraventricular tachycardia, unspecified: Secondary | ICD-10-CM | POA: Diagnosis not present

## 2024-01-03 DIAGNOSIS — R220 Localized swelling, mass and lump, head: Secondary | ICD-10-CM | POA: Diagnosis not present

## 2024-01-03 DIAGNOSIS — Z8616 Personal history of COVID-19: Secondary | ICD-10-CM

## 2024-01-03 DIAGNOSIS — I6523 Occlusion and stenosis of bilateral carotid arteries: Secondary | ICD-10-CM | POA: Diagnosis not present

## 2024-01-03 DIAGNOSIS — I6782 Cerebral ischemia: Secondary | ICD-10-CM | POA: Diagnosis not present

## 2024-01-03 DIAGNOSIS — R918 Other nonspecific abnormal finding of lung field: Secondary | ICD-10-CM | POA: Diagnosis not present

## 2024-01-03 DIAGNOSIS — H538 Other visual disturbances: Secondary | ICD-10-CM | POA: Diagnosis not present

## 2024-01-03 DIAGNOSIS — Z79890 Hormone replacement therapy (postmenopausal): Secondary | ICD-10-CM

## 2024-01-03 DIAGNOSIS — Z954 Presence of other heart-valve replacement: Secondary | ICD-10-CM | POA: Diagnosis not present

## 2024-01-03 DIAGNOSIS — G919 Hydrocephalus, unspecified: Secondary | ICD-10-CM | POA: Diagnosis not present

## 2024-01-03 HISTORY — PX: TRANSCATHETER MITRAL EDGE TO EDGE REPAIR: CATH118311

## 2024-01-03 HISTORY — DX: Tachycardia, unspecified: R00.0

## 2024-01-03 HISTORY — PX: TRANSESOPHAGEAL ECHOCARDIOGRAM (CATH LAB): EP1270

## 2024-01-03 HISTORY — DX: Nonrheumatic mitral (valve) insufficiency: I34.0

## 2024-01-03 HISTORY — DX: Other specified postprocedural states: Z98.890

## 2024-01-03 LAB — POCT ACTIVATED CLOTTING TIME
Activated Clotting Time: 245 s
Activated Clotting Time: 250 s
Activated Clotting Time: 256 s
Activated Clotting Time: 273 s
Activated Clotting Time: 256 s
Activated Clotting Time: 273 s
Activated Clotting Time: 227 s
Activated Clotting Time: 245 s
Activated Clotting Time: 187 s
Activated Clotting Time: 170 s

## 2024-01-03 LAB — SURGICAL PCR SCREEN
MRSA, PCR: NEGATIVE
Staphylococcus aureus: NEGATIVE

## 2024-01-03 LAB — ECHO TEE

## 2024-01-03 LAB — PREPARE RBC (CROSSMATCH)

## 2024-01-03 SURGERY — MITRAL VALVE REPAIR
Anesthesia: General

## 2024-01-03 MED ORDER — METOPROLOL SUCCINATE 12.5 MG HALF TABLET
12.5000 mg | ORAL_TABLET | Freq: Every day | ORAL | Status: DC
  Administered 2024-01-03 – 2024-01-05 (×3): 12.5 mg via ORAL
  Filled 2024-01-03 (×3): qty 1

## 2024-01-03 MED ORDER — HEPARIN SODIUM (PORCINE) 1000 UNIT/ML IJ SOLN
INTRAMUSCULAR | Status: DC | PRN
Start: 2024-01-03 — End: 2024-01-03
  Administered 2024-01-03: 8000 [IU] via INTRAVENOUS
  Administered 2024-01-03: 2000 [IU] via INTRAVENOUS
  Administered 2024-01-03: 4000 [IU] via INTRAVENOUS
  Administered 2024-01-03 (×4): 2000 [IU] via INTRAVENOUS

## 2024-01-03 MED ORDER — HEPARIN SODIUM (PORCINE) 5000 UNIT/ML IJ SOLN
5000.0000 [IU] | Freq: Three times a day (TID) | INTRAMUSCULAR | Status: DC
  Administered 2024-01-03 – 2024-01-05 (×5): 5000 [IU] via SUBCUTANEOUS
  Filled 2024-01-03 (×5): qty 1

## 2024-01-03 MED ORDER — ONDANSETRON HCL 4 MG/2ML IJ SOLN: 4.0000 mg | Freq: Four times a day (QID) | INTRAMUSCULAR | Status: DC | PRN

## 2024-01-03 MED ORDER — CEFAZOLIN SODIUM-DEXTROSE 2-4 GM/100ML-% IV SOLN
2.0000 g | INTRAVENOUS | Status: AC
  Administered 2024-01-03: 2 g via INTRAVENOUS
  Filled 2024-01-03: qty 100

## 2024-01-03 MED ORDER — ACETAMINOPHEN 325 MG PO TABS
ORAL_TABLET | ORAL | Status: AC
  Filled 2024-01-03: qty 2

## 2024-01-03 MED ORDER — HEPARIN (PORCINE) IN NACL 2000-0.9 UNIT/L-% IV SOLN
INTRAVENOUS | Status: DC | PRN
  Administered 2024-01-03 (×3): 1000 mL

## 2024-01-03 MED ORDER — ENALAPRIL MALEATE 10 MG PO TABS
10.0000 mg | ORAL_TABLET | Freq: Every day | ORAL | Status: DC
  Administered 2024-01-03 – 2024-01-05 (×3): 10 mg via ORAL
  Filled 2024-01-03 (×3): qty 1

## 2024-01-03 MED ORDER — SODIUM CHLORIDE 0.9% FLUSH: 3.0000 mL | INTRAVENOUS | Status: DC | PRN

## 2024-01-03 MED ORDER — LEVOTHYROXINE SODIUM 75 MCG PO TABS
150.0000 ug | ORAL_TABLET | Freq: Every day | ORAL | Status: DC
  Administered 2024-01-04 – 2024-01-05 (×2): 150 ug via ORAL
  Filled 2024-01-03 (×2): qty 2

## 2024-01-03 MED ORDER — ONDANSETRON HCL 4 MG/2ML IJ SOLN
INTRAMUSCULAR | Status: DC | PRN
  Administered 2024-01-03: 4 mg via INTRAVENOUS

## 2024-01-03 MED ORDER — CHLORHEXIDINE GLUCONATE 4 % EX SOLN: 60.0000 mL | Freq: Once | CUTANEOUS | Status: DC

## 2024-01-03 MED ORDER — ACETAMINOPHEN 10 MG/ML IV SOLN
INTRAVENOUS | Status: DC | PRN
  Administered 2024-01-03: 1000 mg via INTRAVENOUS

## 2024-01-03 MED ORDER — PHENYLEPHRINE HCL-NACL 20-0.9 MG/250ML-% IV SOLN
INTRAVENOUS | Status: DC | PRN
  Administered 2024-01-03 (×2): 80 ug via INTRAVENOUS
  Administered 2024-01-03: 20 ug/min via INTRAVENOUS
  Administered 2024-01-03 (×2): 80 ug via INTRAVENOUS

## 2024-01-03 MED ORDER — ORAL CARE MOUTH RINSE: 15.0000 mL | OROMUCOSAL | Status: DC | PRN

## 2024-01-03 MED ORDER — PROPOFOL 10 MG/ML IV BOLUS
INTRAVENOUS | Status: DC | PRN
  Administered 2024-01-03: 110 mg via INTRAVENOUS

## 2024-01-03 MED ORDER — PHENYLEPHRINE HCL (PRESSORS) 10 MG/ML IV SOLN
INTRAVENOUS | Status: DC | PRN
  Administered 2024-01-03: 160 ug via INTRAVENOUS
  Administered 2024-01-03 (×5): 80 ug via INTRAVENOUS

## 2024-01-03 MED ORDER — ASPIRIN 81 MG PO CHEW
81.0000 mg | CHEWABLE_TABLET | Freq: Every day | ORAL | Status: DC
  Administered 2024-01-04 – 2024-01-05 (×2): 81 mg via ORAL
  Filled 2024-01-03 (×2): qty 1

## 2024-01-03 MED ORDER — SODIUM CHLORIDE 0.9 % IV SOLN: INTRAVENOUS | Status: DC

## 2024-01-03 MED ORDER — CLOPIDOGREL BISULFATE 75 MG PO TABS
75.0000 mg | ORAL_TABLET | Freq: Every day | ORAL | Status: DC
  Administered 2024-01-04 – 2024-01-05 (×2): 75 mg via ORAL
  Filled 2024-01-03 (×2): qty 1

## 2024-01-03 MED ORDER — LIDOCAINE 2% (20 MG/ML) 5 ML SYRINGE
INTRAMUSCULAR | Status: DC | PRN
  Administered 2024-01-03: 80 mg via INTRAVENOUS

## 2024-01-03 MED ORDER — FENTANYL CITRATE (PF) 250 MCG/5ML IJ SOLN
INTRAMUSCULAR | Status: DC | PRN
  Administered 2024-01-03: 100 ug via INTRAVENOUS

## 2024-01-03 MED ORDER — LACTATED RINGERS IV SOLN: INTRAVENOUS | Status: DC | PRN

## 2024-01-03 MED ORDER — CHLORHEXIDINE GLUCONATE 0.12 % MT SOLN
15.0000 mL | Freq: Once | OROMUCOSAL | Status: AC
  Administered 2024-01-03: 15 mL via OROMUCOSAL
  Filled 2024-01-03: qty 15

## 2024-01-03 MED ORDER — SODIUM CHLORIDE 0.9% FLUSH
3.0000 mL | Freq: Two times a day (BID) | INTRAVENOUS | Status: DC
  Administered 2024-01-03 – 2024-01-05 (×4): 3 mL via INTRAVENOUS

## 2024-01-03 MED ORDER — HEPARIN (PORCINE) IN NACL 1000-0.9 UT/500ML-% IV SOLN
INTRAVENOUS | Status: DC | PRN
  Administered 2024-01-03: 500 mL

## 2024-01-03 MED ORDER — FENTANYL CITRATE (PF) 100 MCG/2ML IJ SOLN
INTRAMUSCULAR | Status: AC
  Filled 2024-01-03: qty 2

## 2024-01-03 MED ORDER — ROCURONIUM BROMIDE 10 MG/ML (PF) SYRINGE
PREFILLED_SYRINGE | INTRAVENOUS | Status: DC | PRN
  Administered 2024-01-03: 50 mg via INTRAVENOUS
  Administered 2024-01-03: 40 mg via INTRAVENOUS
  Administered 2024-01-03: 60 mg via INTRAVENOUS
  Administered 2024-01-03: 10 mg via INTRAVENOUS
  Administered 2024-01-03: 50 mg via INTRAVENOUS
  Administered 2024-01-03: 20 mg via INTRAVENOUS

## 2024-01-03 MED ORDER — LABETALOL HCL 5 MG/ML IV SOLN: 10.0000 mg | INTRAVENOUS | Status: DC | PRN

## 2024-01-03 MED ORDER — LIDOCAINE-EPINEPHRINE 1 %-1:100000 IJ SOLN
INTRAMUSCULAR | Status: AC
  Filled 2024-01-03: qty 1

## 2024-01-03 MED ORDER — SUGAMMADEX SODIUM 200 MG/2ML IV SOLN
INTRAVENOUS | Status: DC | PRN
  Administered 2024-01-03: 150.6 mg via INTRAVENOUS

## 2024-01-03 MED ORDER — DEXAMETHASONE SODIUM PHOSPHATE 10 MG/ML IJ SOLN
INTRAMUSCULAR | Status: DC | PRN
  Administered 2024-01-03: 5 mg via INTRAVENOUS

## 2024-01-03 MED ORDER — ACETAMINOPHEN 325 MG PO TABS
650.0000 mg | ORAL_TABLET | ORAL | Status: DC | PRN
  Administered 2024-01-03 – 2024-01-04 (×2): 650 mg via ORAL
  Filled 2024-01-03: qty 2

## 2024-01-03 MED ORDER — OXYCODONE-ACETAMINOPHEN 5-325 MG PO TABS: 1.0000 | ORAL_TABLET | ORAL | Status: DC | PRN

## 2024-01-03 MED ORDER — FERROUS SULFATE 325 (65 FE) MG PO TABS
325.0000 mg | ORAL_TABLET | Freq: Every day | ORAL | Status: DC
  Administered 2024-01-04 – 2024-01-05 (×2): 325 mg via ORAL
  Filled 2024-01-03 (×2): qty 1

## 2024-01-03 MED ORDER — HYDRALAZINE HCL 20 MG/ML IJ SOLN: 5.0000 mg | INTRAMUSCULAR | Status: DC | PRN

## 2024-01-03 SURGICAL SUPPLY — 17 items
CATH MITRA STEERABLE GUIDE (CATHETERS) IMPLANT
CLIP MITRA G4 DELIVERY SYS XTW (Clip) IMPLANT
CLOSURE PERCLOSE PROSTYLE (VASCULAR PRODUCTS) IMPLANT
KIT HEART LEFT (KITS) ×2 IMPLANT
KIT VERSACROSS LRG ACCESS (CATHETERS) IMPLANT
PACK CARDIAC CATHETERIZATION (CUSTOM PROCEDURE TRAY) ×1 IMPLANT
SHEATH AGILIS NXT 8.5F 71CM (SHEATH) IMPLANT
SHEATH DILAT COONS TAPER 22F (SHEATH) IMPLANT
SHEATH PINNACLE 5F 10CM (SHEATH) IMPLANT
SHEATH PINNACLE 8F 10CM (SHEATH) IMPLANT
SHEATH PROBE COVER 6X72 (BAG) ×1 IMPLANT
STOPCOCK MORSE 400PSI 3WAY (MISCELLANEOUS) ×6 IMPLANT
SYSTEM MITRACLIP G4 (SYSTAGENIX WOUND MANAGEMENT) IMPLANT
TRANSDUCER W/STOPCOCK (MISCELLANEOUS) ×1 IMPLANT
TUBING ART PRESS 72 MALE/FEM (TUBING) ×1 IMPLANT
WIRE EMERALD 3MM-J .035X150CM (WIRE) IMPLANT
WIRE MICRO SET SILHO 5FR 7 (SHEATH) IMPLANT

## 2024-01-03 NOTE — Progress Notes (Signed)
SITE AREA: left groin  SITE PRIOR TO REMOVAL:  LEVEL 0  PRESSURE APPLIED FOR: approximately 15 minutes  MANUAL: yes  PATIENT STATUS DURING PULL: stable, tylenol given for c/o pain  POST PULL SITE:  LEVEL 0  POST PULL INSTRUCTIONS GIVEN: yes, drsg x 24 hours, may shower tomorrow, ni running, jumping, climb easy on stair and into and out of vehicles, please inform staff or 911 if at home if bleeding to groin sites occurs, no sitting in water x 1 week, no hot tubs, bath tubs, or pools  POST PULL PULSES PRESENT: bilateral pedal pulses at +2  DRESSING APPLIED: gauze with tegaderm  BEDREST BEGINS @ 1705   COMMENTS:

## 2024-01-03 NOTE — Anesthesia Postprocedure Evaluation (Signed)
Anesthesia Post Note  Patient: [name]  Procedure(s) Performed: TRANSCATHETER MITRAL EDGE TO EDGE REPAIR TRANSESOPHAGEAL ECHOCARDIOGRAM     Patient location during evaluation: Cath Lab Anesthesia Type: General Level of consciousness: sedated and patient cooperative Pain management: pain level controlled Vital Signs Assessment: post-procedure vital signs reviewed and stable Respiratory status: spontaneous breathing Cardiovascular status: stable Anesthetic complications: no   There were no known notable events for this encounter.  Last Vitals:  Vitals:   01/03/24 1132 01/03/24 1137  BP:  (!) 145/70  Pulse: (!) 0 68  Resp:  17  Temp:  36.4 C  SpO2:  96%    Last Pain:  Vitals:   01/03/24 1137  TempSrc: Oral  PainSc: Asleep                 Lewie Loron

## 2024-01-03 NOTE — Discharge Instructions (Signed)
Home Care Following Your MitraClip Procedure     ACTIVITY AND EXERCISE  Daily activity and exercise are an important part of your recovery. People recover at different rates depending on their general health and type of valve procedure. One of the best ways to get stronger after your procedure is to walk. If your health care provider team agrees, start with short walks at home. Walk a little more each day. Take someone with you to make sure you are safe until you feel OK to walk alone.  Most people recovering feel better relatively quickly.  Avoid strenuous activity or exercise for at least 1 week or as instructed by your physician. No lifting, pushing, pulling more than 10 pounds (examples to avoid: groceries, vacuuming, gardening, golfing)  Take care not to put strain on your abdominal muscles when coughing, sneezing or moving your bowels. You may resume sexual activity within 7 to 10 days, unless your provider instructs you differently.  NOTE: You will typically see one of our providers 7-10 days after your procedure to discuss WHEN TO RESUME the above activities.    DRIVING Do not drive for until you are seen for follow up and cleared by a provider. If you have been told by your doctor in the past that you may not drive, you must talk with him/her before you begin driving again.  CARE FOR YOUR GROIN INCISION It is normal for your incision to be bruised, itchy or sore while it is healing. It may take up to a week or two to heal. Care for your bandage as instructed by your physician team. Wash the incision site everyday with warm water and soap. Gently pat it dry and do not put powder, lotion or ointment on the incision until it is healed.  Check your groin site every day for signs of infection. Check for: Redness, swelling, warmth or pain Fluid or blood draining at incision site A new lump or bump  HYGIENE Unless your physician tells you otherwise, you may take a shower when you return  home. After the shower, pat the site dry. Do NOT use powder, oils or lotions in your groin area until the site has completely healed. Do not take a bath until your incision is completely healed.  Do not submerge your incision in a swimming pool, lake or hot tub until completely healed. Wear loose fitting clothes over the incision site until it is healed.  If you notice any fevers, chills, increased pain, swelling, bleeding or pus, please contact your provider.   ADDITIONAL INFORMATION Keep all follow up visits as told by your provider. This is important.  Weigh yourself the same time each day wearing similar clothing. Carefully follow your providers instructions regarding medications you need to take, especially if blood-thinning medication(s) are prescribed. Call your provider if you cannot keep taking your mediations because of side effects, such as rash, bleeding or upset stomach. Ask your provider if you need to take antibiotic medicine before medical or dental procedures to prevent infection on your heart valve. You will receive an Implant Identification Card, which your provider will fill out and you must carry with you at all times.  Show your Implant Identification Card if you report to an emergency room.  This card identifies you as a patient who has had a MitraClip NT device implanted.  If you require a magnetic resonance imaging (MRI) scan, tell your provider or MRI technician that you have a MitraClip NT device implanted.  Test results  indicate that patients with the MitraClip NT device can safely undergo MRI scans under certain conditions described on the card.    WHEN TO SEEK MEDICAL ATTENTION  Contact your provider if: You have a fever You have a weight gain of more than 2 pounds in 24 hours or more than 5 pounds in a week You have extreme fatigue You have pain that does not get better with medicine You have fluid or blood coming from your catheter insertion site You have  any signs or symptom of groin infection  Seek immediate medical help by calling 911 or go to your nearest Emergency Department if:  You have chest pain or trouble breathing. You have sudden numbness or weakness in your face, arms or legs. You have shortness of breath that doesn't get better with rest. You have a bowel movement that is dark black or bright red. You have increased swelling in your hand, feet, or ankles. You have a heart rate faster than 120 beats per minute with shortness of breath. Heart rate lower than 50 beats per minute or a new irregular heart rate.

## 2024-01-03 NOTE — Anesthesia Procedure Notes (Signed)
Procedure Name: Intubation Date/Time: 01/03/2024 8:13 AM  Performed by: Cy Blamer, CRNAPre-anesthesia Checklist: Patient identified, Emergency Drugs available, Suction available and Patient being monitored Patient Re-evaluated:Patient Re-evaluated prior to induction Oxygen Delivery Method: Circle system utilized Preoxygenation: Pre-oxygenation with 100% oxygen Induction Type: IV induction Ventilation: Mask ventilation without difficulty Laryngoscope Size: Miller and 2 Grade View: Grade II Tube type: Oral Tube size: 7.0 mm Number of attempts: 1 Airway Equipment and Method: Stylet Placement Confirmation: ETT inserted through vocal cords under direct vision, positive ETCO2 and breath sounds checked- equal and bilateral Secured at: 21 cm Tube secured with: Tape Dental Injury: Teeth and Oropharynx as per pre-operative assessment

## 2024-01-03 NOTE — Progress Notes (Signed)
  [facility]ENTER   MULTIDISCIPLINARY HEART VALVE TEAM  Patient doing well s/p TEER. She is hemodynamically stable. Right groin site with a small superficial ooze with no evidence of hematoma. ACT remains >200 on last check. Right groin injected with 8ml of Lidocaine-Epinephrine 1:100000. Patient tolerated well. Groin redressed. Early ambulation after bedrest completed and hopeful discharge over the next 24-48 hours.   Georgie Chard NP-C Structural Heart Team  Phone: [phone]

## 2024-01-03 NOTE — Op Note (Addendum)
[facility]   MULTIDISCIPLINARY HEART TEAM  Date of Procedure:  01/03/2024  Preoperative Diagnosis: Severe Symptomatic Mitral Regurgitation (Stage D)  Postoperative Diagnosis: Same   Procedure Performed: Ultrasound-guided right transfemoral venous access Double PreClose right femoral vein Transseptal puncture using Bailess RF needle and Agilis catheter Mitral valve repair with MitraClip XTW x 2 at A2/P2  Surgeon:  Alverda Skeans, MD Co-Surgeon: Micheline Chapman, MD   Echocardiographer: Riley Lam, MD  Anesthesiologist: Lewie Loron, MD  Device Implant: Mitraclip XTW x 2, Serial # [id number]   Procedural Indication: Severe Non-rheumatic Mitral Regurgitation (Stage D)   Brief History: The patient is a 78 year old female with a history of hypertension, hyperlipidemia, gastric bypass, hypothyroidism, osteoarthritis, and severe degenerative mitral regurgitation with increasing fatigue was referred for elective transcatheter mitral valve repair.  Echo Findings: Preop:  Normal LV systolic function Severe MR secondary to flail P2, Grade 4+ Post-op:  Unchanged LV systolic function Trace residual MR  Procedural Details: Prep The patient is brought to the cardiac catheterization lab in the fasting state. General anesthesia is induced. The patient is prepped from the groin to chin. A foley catheter is placed. Hemodynamics are monitored via a radial artery line.   Venous Access Using ultrasound guidance, the right femoral vein is punctured. Ultrasound images are captured and stored in the patient's chart. The vein is dilated and 2 Perclose devices are deplyed at 10' and 2' positions to 'Preclose' the femoral vein. An 8 Fr sheath is inserted.  Transseptal Puncture A Baylis Versacross wire is advanced into the SVC A Baylis transseptal dilator is advanced into the SVC, and the VersaCross RF wire is retracted into the dilator  An Agilis sheath is retracted  into the RA under fluoroscopic and echo guidance to obtain position on the posterior fossa where echo measurements are made to assure appropriate access to the mitral valve. Once proper position is confirmed by echo, RF energy is delivered and the VersaCross wire is advanced into the LA without resistance. The dilator and sheath are advanced over the wire where proper position is confirmed by echo and pressure measurement Weight based IV heparin is administered and a therapeutic ACT > 250 is confirmed  Steerable Guide Catheter Insertion The VersaCross wire is positioned at the left upper pulmonary vein The femoral vein is progressively dilated and the 24 Fr Steerable guide catheter is inserted and then directed across the interatrial septum over the wire. Position is confirmed approximately 3 cm into the left atrium The guide is de-aired   MitraClip Insertion The MitraClip XTW is prepped per protocol and inserted via the introducer into the steerable guide catheter The Clip Delivery System (CDS) is advanced under fluoro and echo guidance so that the sleeve markers are evenly spaced on each side of the guide marker  MitraClip Positioning in the Left Atrium (Supravalvular Alignment) M-knob is applied to bring the Clip towards the mitral valve. Echo guidance is used to avoid contact with LA structures. The Clip arms are opened to 180 degrees 2D and 3D TEE imaging is performed in multiple planes and the Clip is positioned and aligned above the valve using standard steering techniques   Entry into the Left Ventricle and Mitral Valve Leaflet Grasp The Clip is advanced across the mitral valve into the LV, maintaining proper orientation The Clip arms are opened to 120 degrees and the Clip is slowly retracted  Capture of both the anterior and posterior leaflets are visualized by echo and the grippers are  dropped  MitraClip Deployment After extensive echo evaluation, reduction in mitral regurgitation is  felt to be adequate Following standard protocol, the lock line is removed after testing the lock mechanism. The lock is rechecked and is shown to be intact. The MitraClip device is deployed and the clip delivery system is removed under echo guidance with caution taken to avoid contact with LA structures  Placement of Clip #2 MitraClip Insertion The MitraClip XTW is prepped per protocol and inserted via the introducer into the steerable guide catheter The Clip Delivery System (CDS) is advanced under fluoro and echo guidance so that the sleeve markers are evenly spaced on each side of the guide marker  MitraClip Positioning in the Left Atrium (Supravalvular Alignment) M-knob is applied to bring the Clip towards the mitral valve. Echo guidance is used to avoid contact with LA structures. Low tidal volume respiration is initiated The Clip arms are opened to 180 degrees 2D and 3D TEE imaging is performed in multiple planes and the Clip is positioned and aligned above the valve using standard steering techniques   Entry into the Left Ventricle and Mitral Valve Leaflet Grasp The Clip is advanced across the mitral valve into the LV, maintaining proper orientation and with caution taken to avoid contact with the first Clip The Clip arms are opened further and the Clip is slowly retracted  Capture of both the anterior and posterior leaflets are visualized by echo and the grippers are dropped  MitraClip Deployment After extensive echo evaluation, reduction in mitral regurgitation is felt to be adequate Following standard protocol, the MitraClip device is deployed, gripper line and lock line are removed  Device Removal The clip delivery system is removed under echo guidance The steerable guide catheter is retracted into the right atrium and the interatrial septum is assessed by echo without evidence of right-to-left shunting or large ASD  Hemostasis The guide catheter is removed over a 0.035" wire  and the Perclose sutures are tightened with complete hemostasis and no evidence of hematoma  Estimated blood loss: minimal  There are no immediate procedural complications. The patient is transferred to the post-procedure recovery area in stable condition.   Orbie Pyo 01/03/2024 12:50 PM

## 2024-01-03 NOTE — Progress Notes (Signed)
Patient in Cath lab holding. Report received from CRNA Cy Blamer. Patient [id number] currently resting in supine position in bed. 70F sheath still in place in left groin, waiting for ACT to come down to safely pull sheath. Right groin site is dressed and supple to touch. No hematoma noted on either groin. Vitals taken. EKG done. IV fluids runing at [facility] through [facility].  Patient sleeping, side rails x4 up for safety.  Call bell in reach.

## 2024-01-03 NOTE — Interval H&P Note (Signed)
History and Physical Interval Note:  01/03/2024 6:45 AM  [name]  has presented today for surgery, with the diagnosis of Severe Mitral Insufficiency.  The various methods of treatment have been discussed with the patient and family. After consideration of risks, benefits and other options for treatment, the patient has consented to  Procedure(s): TRANSCATHETER MITRAL EDGE TO EDGE REPAIR (N/A) TRANSESOPHAGEAL ECHOCARDIOGRAM (N/A) as a surgical intervention.  The patient's history has been reviewed, patient examined, no change in status, [name] for surgery.  I have reviewed the patient's chart and labs.  Questions were answered to the patient's satisfaction.     Orbie Pyo

## 2024-01-03 NOTE — Discharge Summary (Addendum)
[facility]   MULTIDISCIPLINARY HEART VALVE TEAM  Discharge Summary    Patient ID: [name] MRN: [id number]; DOB: [birth year]  Admit date: 01/03/2024 Discharge date: 01/05/2024  Primary Care Provider: Tommie Sams, DO  Primary Cardiologist: Hailey Pates, MD / Hailey. Lynnette Caffey, MD (TEER)  Discharge Diagnoses    Principal Problem:   S/P mitral valve clip implantation Active Problems:   Essential hypertension, benign   Hyperlipidemia   Severe mitral regurgitation   Transient monocular blindness, left   CVA (cerebral vascular accident) [name])   Parotid mass  Allergies Allergies  Allergen Reactions   Codeine Nausea And Vomiting   Demerol Nausea And Vomiting   Nsaids Other (See Comments)    Due to gastric bypass   Diagnostic Studies/Procedures    Mitral Valve-Transcatheter Edge to Edge Repair 01/03/24:  Procedure Performed: Ultrasound-guided right transfemoral venous access Double PreClose right femoral vein Transseptal puncture using Bailess RF needle and Agilis catheter Mitral valve repair with MitraClip XTW x 2 at A2/P2  Echo Findings: Preop:  Normal LV systolic function Severe MR secondary to flail P2, Grade 4+ Post-op:  Unchanged LV systolic function Trace residual MR _____________   Echo 01/04/24:   1. Left ventricular ejection fraction, by estimation, is 60 to 65%. Left  ventricular ejection fraction by 3D volume is 63 %. The left ventricle has  normal function. The left ventricle has no regional wall motion  abnormalities. There is mild concentric  left ventricular hypertrophy. Left ventricular diastolic parameters are  indeterminate. The average left ventricular global longitudinal strain is  -19.4 %. The global longitudinal strain is normal.   2. Right ventricular systolic function is normal. The right ventricular  size is normal. There is normal pulmonary artery systolic pressure.   3. Left atrial size was severely dilated.   4. S/p 2  MitraClip XTW (A2-P2 medial near A3/P3 and lateral A2/P2). Mean  gradient 3 mm Hg. There is no significant mitral regurgitation. Left to  right shunting not seen in this study. . The mitral valve has been  repaired/replaced. Trivial mitral valve  regurgitation. No evidence of mitral stenosis. The mean mitral valve  gradient is 3.0 mmHg with average heart rate of 56 bpm. There is a  Mitra-Clip present in the mitral position.   5. The aortic valve is tricuspid. Aortic valve regurgitation is trivial.   6. The inferior vena cava is normal in size with greater than 50%  respiratory variability, suggesting right atrial pressure of 3 mmHg.   MRI Brain 01/04/24:  IMPRESSION: Scattered punctate acute infarcts in multiple vascular territories, including the left cerebellum, right occipital lobe, left parietal lobe, and right frontal lobe. Findings are favored to be secondary to a central embolic etiology. No hemorrhage.    CTA Head/Neck 01/04/24:  IMPRESSION: 1. Negative CTA for large vessel occlusion or other emergent finding. 2. Mild atheromatous change about the carotid bifurcations and carotid siphons without hemodynamically significant or correctable stenosis. 3. 1.2 cm soft tissue mass within the superficial right parotid gland, suspicious for a primary salivary neoplasm. Nonemergent outpatient ENT referral suggested for further workup and evaluation. 4.  Aortic Atherosclerosis (ICD10-I70.0).  History of Present Illness     [name] is a 78 y.o. female with a history of mitral valve regurgitation, HTN and history of thyroid cancer who presented to [facility] on 01/03/24 for planned TEER.   [name] is followed by Hailey. Tenny [name] for her cardiology care. Previous TEE had a mobile echodensity attached  possibly to the anterior mitral valve leaflet and was also noted to have moderate eccentric anteriorly directed mitral valve regurgitation. Plan was for repeat TEE. This was attempted in  11/2022 but unsuccessful due to inability to advance the probe. At the time of her visit, it was recommended that she keep follow-up with GI for further evaluation of her esophagus and TEE could be performed afterwards. She was seen by GI and had an EGD 07/2023 which showed no obvious stricture in the oropharynx but was slightly torturous. 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 Subsequent mTEER workup with R/LHC with no obstructive CAD.    She was seen for pre-op evaluation and reported episodes of intermittent tachyrhythmia which woke her from sleep. HR's nearing 160bpm and BP around 182/103 on her Apple watch and home BP cuff. ZIO monitor was placed which resulted 1/21 and showed predominant NSR with episodes of SVT versus Atrial Tachycardia. She was prescribed PRN metoprolol 25mg  for breakthrough episodes.   [name] Course     Severe mitral regurgitation: with posterior leaflet prolapse, ruptured chord, and flail P2 segment on pre-op TEE, she is now s/p successful MitraClip XTW x2 at A2/P2 reducing MR from 4+ to trace on post procedure echo on 01/03/24. Started on DAPT with ASA 81mg  and Plavix 75mg . Echo 01/04/24 with normal LV function at 60-65% with mild concentric LVH, severe LA dilation, trivial AR, and trivial MR s/p MitraClip XTW x2 in the A2/P2 medical and lateral A2/P2 positions with a mean gradient at . She will require lifelong dental SBE. This will be further discussed and RXd at follow up next week. Post procedure instructions reviewed. Plan TOC next week, then one month OV with repeat echocardiogram.  See neuro plan below.    Acute stroke: Patient reported acute left eye monocular blindness 01/04/24. CT performed with no acute intracranial abnormalities. No code stroke called given brief  duration (20 sec with complete resolution). Neurology consulted with recommendations for MRI brain and CTA head/neck. MRI 1/23 showed scattered punctate acute infarcts in multiple vascular territories, including the left cerebellum, right occipital lobe, left parietal lobe, and right frontal lobe felt secondary to a central embolic etiology. Neurology felt peri-procedure related however workup to r/o other etiologies. CTA head with no large vessel occlusion or other emergent finding. Seen by PT/OT/SLP with no further follow up needed as patient felt to be back to baseline. No AF on telemetry. Neurology final recommendations for DAPT for 3 weeks then can transition to ASA monotherapy although patient will stay on DAPT for at least 6 months with MitraClip placement then will transition to ASA. Will order 30-day monitor at follow up next week per neuro. Follow results.    SVT/Sinus tachycardia/atrial tachycardia: Recent complaints of tachy-palpitations with subsequent ZIO confirming predominant SR with 71 runs of SVT with intermittent possible atrial tachycardia episodes. Previously RXd PRN metoprolol however started on Toprol XL 12.5mg  this admission. Telemetry with NSR/SB with rates in the 60's and no SVT/AT. Plan 30-day monitor per neurology.    HTN: Stable. Continue home enalapril. Toprol XL 12.5mg  added this admission. Will discuss antihypertensive plan with neurology.   Incidental findings: CTA head/neck with no large vessel occlusion or emergent findings although noted to have incidental 1.2cm soft tissue mass within the superficial right parotid gland suspicious for  primary salivary neoplasm with recommendations for non-emergent outpatient ENT referral. Referral placed 01/05/24.   Consultants: Neurology    The patient was seen and examined by Hailey. Lynnette [name] who feels that she is stable and ready for discharge today, 01/05/24.  _____________  Discharge Vitals Blood pressure 139/72, pulse 66,  temperature 97.6 F (36.4 C), temperature source Oral, resp. rate 18, height 5\' 7"  (1.702 m), weight 78.1 kg, SpO2 98%.  Filed Weights   01/02/24 1040 01/03/24 0635 01/03/24 1734  Weight: 75.3 kg 75.3 kg 78.1 kg   Disposition   Pt is being discharged home today in good condition.  Follow-up Plans & Appointments     Follow-up Information     Filbert Schilder, NP Follow up on 01/08/2024.   Specialty: Cardiology Why: Your follow up is scheduled for 01/08/24 at 11:10AM. Please plan to arrive by 10:55AM. Contact information: [address] [place] [address] [phone]                Discharge Instructions     Amb Referral to Cardiac Rehabilitation   Complete by: As directed    Diagnosis: Valve Repair   Valve: Mitral   After initial evaluation and assessments completed: Virtual Based Care may be provided alone or in conjunction with Phase 2 Cardiac Rehab based on patient barriers.: Yes   Intensive Cardiac Rehabilitation (ICR) [facility] location only OR Traditional Cardiac Rehabilitation (TCR) *If criteria for ICR are not met will enroll in TCR [name] only): Yes   Call MD for:  difficulty breathing, headache or visual disturbances   Complete by: As directed    Call MD for:  extreme fatigue   Complete by: As directed    Call MD for:  hives   Complete by: As directed    Call MD for:  persistant dizziness or light-headedness   Complete by: As directed    Call MD for:  persistant nausea and vomiting   Complete by: As directed    Call MD for:  redness, tenderness, or signs of infection (pain, swelling, redness, odor or green/yellow discharge around incision site)   Complete by: As directed    Call MD for:  severe uncontrolled pain   Complete by: As directed    Call MD for:  temperature >100.4   Complete by: As directed    Diet - low sodium heart healthy   Complete by: As directed    Discharge instructions   Complete by: As directed    Home Care Following Your  MitraClip Procedure      If you have any questions or concerns you can call the structural heart office at [phone] during normal business hours 8am-4pm. If you have an urgent need after hours or on the weekend, please call [phone] to talk to the on call provider for general cardiology. If you have an emergency that requires immediate attention, please call 911.   Groin Site Care Refer to this sheet in the next few weeks. These instructions provide you with information on caring for yourself after your procedure. Your caregiver may also give you more specific instructions. Your treatment has been planned according to current medical practices, but problems sometimes occur. Call your caregiver if you have any problems or questions after your procedure. HOME CARE INSTRUCTIONS You may shower 24 hours after the procedure. Remove the bandage (dressing) and gently wash the site with plain soap and water. Gently pat the site dry.  Do not apply powder or lotion to  the site.  Do not sit in a bathtub, swimming pool, or whirlpool for 5 to 7 days.  No bending, squatting, or lifting anything over 10 pounds (4.5 kg) as directed by your caregiver.  Inspect the site at least twice daily.  Do not drive home if you are discharged the same day of the procedure. Have someone else drive you.  You may drive 72 hours after the procedure unless otherwise instructed by your caregiver.  What to expect: Any bruising will usually fade within 1 to 2 weeks.  Blood that collects in the tissue (hematoma) may be painful to the touch. It should usually decrease in size and tenderness within 1 to 2 weeks.  SEEK IMMEDIATE MEDICAL CARE IF: You have unusual pain at the groin site or down the affected leg.  You have redness, warmth, swelling, or pain at the groin site.  You have drainage (other than a small amount of blood on the dressing).  You have chills.  You have a fever or persistent symptoms for more than 72 hours.   You have a fever and your symptoms suddenly get worse.  Your leg becomes pale, cool, tingly, or numb.  You have bleeding from the site. Hold pressure on the site until it subsides.    After MitraClip Checklist  Check  Test Description  Follow up appointment in 1-2 weeks  Most of our patients will see our structural heart APP or your primary cardiologist within 1-2 weeks. Your incision site will be checked and you will be cleared to resume all normal activities if you are doing well.    1 month echo and follow up  You will have an echo to check on your heart valve clip and be seen back in the office by our structural heart APP  Follow up with your primary cardiologist You will need to be seen by your primary cardiologist in the following 3-6 months after your 1 month appointment in the valve clinic. Often times your blood thinners will be changed. This is decided on a case by case basis.   1 year echo and follow up You will have another echo to check on your heart valve after one year and be seen back in the office by our structural heart APP. This your last structural heart visit.  Bacterial endocarditis prophylaxis  You will have to take antibiotics for the rest of your life before all dental procedures (even dental cleanings) to protect your heart valve from potential infection. Antibiotics are also required before some surgeries. Please check with your cardiologist before scheduling any surgeries. Also, please make sure to tell us if you have a penicillin allergy as you will require an alternative antibiotic.   ______________  Your Implant Identification Card Following your procedure, you will receive an Implant Identification Card, which your doctor will fill out and which you must carry with you at all times. Show your Implant Identification Card if you report to an emergency room. This card identifies you as a patient who has had a MitraClip device implanted. If you require a magnetic  resonance imaging (MRI) scan, tell your doctor or MRI technician that you have a MitraClip device implanted. Test results indicate that patients with the MitraClip device can safely undergo MRI scans under certain conditions described on the card.   Increase activity slowly   Complete by: As directed       Discharge Medications   Allergies as of 01/05/2024       Reactions  Codeine Nausea And Vomiting   Demerol Nausea And Vomiting   Nsaids Other (See Comments)   Due to gastric bypass        Medication List     STOP taking these medications    famotidine 40 MG tablet Commonly known as: PEPCID   metoprolol tartrate 25 MG tablet Commonly known as: LOPRESSOR       TAKE these medications    aspirin 81 MG chewable tablet Chew 1 tablet (81 mg total) by mouth daily. Start taking on: January 06, 2024   Biotin 5000 MCG Caps Take 5,000 mcg by mouth daily.   CALCIUM CITRATE+D3 PO Take 1 tablet by mouth daily.   carboxymethylcellulose 0.5 % Soln Commonly known as: REFRESH PLUS Place 1 drop into both eyes 3 (three) times daily as needed (Dry eye).   clopidogrel 75 MG tablet Commonly known as: PLAVIX Take 1 tablet (75 mg total) by mouth daily with breakfast. Start taking on: January 06, 2024   Cyanocobalamin 5000 MCG Tbdp Take 5,000 mcg by mouth daily.   enalapril 10 MG tablet Commonly known as: VASOTEC Take 1 tablet (10 mg total) by mouth daily.   ferrous sulfate 325 (65 FE) MG tablet Take 325 mg by mouth daily.   levothyroxine 150 MCG tablet Commonly known as: Synthroid Take 1 tablet (150 mcg total) by mouth daily before breakfast. What changed:  when to take this additional instructions   Magnesium 250 MG Caps Take 250 mg by mouth daily.   metoprolol succinate 25 MG 24 hr tablet Commonly known as: TOPROL-XL Take 0.5 tablets (12.5 mg total) by mouth daily. Start taking on: January 06, 2024   multivitamin with minerals Tabs tablet Take 1 tablet by  mouth in the morning and at bedtime. Woman 50+   pantoprazole 40 MG tablet Commonly known as: Protonix Take 1 tablet (40 mg total) by mouth daily.   promethazine-dextromethorphan 6.25-15 MG/5ML syrup Commonly known as: PROMETHAZINE-DM Take 5 mLs by mouth 4 (four) times daily as needed for cough.   rosuvastatin 20 MG tablet Commonly known as: CRESTOR Take 1 tablet (20 mg total) by mouth daily. Start taking on: January 06, 2024   valACYclovir 1000 MG tablet Commonly known as: VALTREX TAKE 2 TABLETS AT FIRST SIGN OF FEVER BLISTER, THEN 2 TABLETS 2 HOURSLATER   Vitamin D3 Ultra Strength 125 MCG (5000 UT) capsule Generic drug: Cholecalciferol Take 5,000 Units by mouth daily.   Zinc 50 MG Tabs Take 50 mg by mouth daily.        Outstanding Labs/Studies   Plan 30-day monitor  ______________________  Duration of Discharge Encounter: APP Time: 45 minutes    Signed, Georgie Chard, NP 01/05/2024, 2:32 PM [phone]   ATTENDING ATTESTATION:  After conducting a review of all available clinical information with the care team, interviewing the patient, and performing a physical exam, I agree with the findings and plan described in this note.   GEN: No acute distress.   HEENT:  MMM, no JVD, no scleral icterus Cardiac: RRR, no murmurs, rubs, or gallops.  Respiratory: Clear to auscultation bilaterally. GI: Soft, nontender, non-distended  MS: No edema; No deformity. Neuro:  Nonfocal  Vasc:  +2 radial pulses  Patient doing well after MitraClip procedure complicated by stroke.  She is completely neurologically intact.  She will be referred for a loop monitor.  We will discharge her on dual antiplatelet therapy for 3 weeks and then aspirin monotherapy indefinitely.  APP discharge time:  26 MD discharge time:  15  Alverda Skeans, MD Pager [phone]

## 2024-01-03 NOTE — Transfer of Care (Signed)
Immediate Anesthesia Transfer of Care Note  Patient: [name]  Procedure(s) Performed: TRANSCATHETER MITRAL EDGE TO EDGE REPAIR TRANSESOPHAGEAL ECHOCARDIOGRAM  Patient Location: Cath Lab  Anesthesia Type:General  Level of Consciousness: awake, alert , and oriented  Airway & Oxygen Therapy: Patient Spontanous Breathing and Patient connected to nasal cannula oxygen  Post-op Assessment: Report given to RN, Post -op Vital signs reviewed and stable, Patient moving all extremities X 4, and Patient able to stick tongue midline  Post vital signs: Reviewed and stable  Last Vitals:  Vitals Value Taken Time  BP 145/70   Temp 97.6   Pulse 67 01/03/24 1141  Resp 14 01/03/24 1141  SpO2 96 % 01/03/24 1141  Vitals shown include unfiled device data.  Last Pain:  Vitals:   01/03/24 0811  TempSrc:   PainSc: 0-No pain         Complications: There were no known notable events for this encounter.

## 2024-01-04 ENCOUNTER — Inpatient Hospital Stay (HOSPITAL_COMMUNITY): Payer: Medicare HMO

## 2024-01-04 ENCOUNTER — Encounter (HOSPITAL_COMMUNITY): Payer: Self-pay | Admitting: Internal Medicine

## 2024-01-04 DIAGNOSIS — Z95818 Presence of other cardiac implants and grafts: Secondary | ICD-10-CM

## 2024-01-04 DIAGNOSIS — H53122 Transient visual loss, left eye: Secondary | ICD-10-CM | POA: Insufficient documentation

## 2024-01-04 DIAGNOSIS — Z954 Presence of other heart-valve replacement: Secondary | ICD-10-CM

## 2024-01-04 DIAGNOSIS — I63413 Cerebral infarction due to embolism of bilateral middle cerebral arteries: Secondary | ICD-10-CM | POA: Diagnosis not present

## 2024-01-04 DIAGNOSIS — I639 Cerebral infarction, unspecified: Secondary | ICD-10-CM | POA: Diagnosis not present

## 2024-01-04 DIAGNOSIS — I6782 Cerebral ischemia: Secondary | ICD-10-CM | POA: Diagnosis not present

## 2024-01-04 DIAGNOSIS — I340 Nonrheumatic mitral (valve) insufficiency: Secondary | ICD-10-CM | POA: Diagnosis not present

## 2024-01-04 DIAGNOSIS — G919 Hydrocephalus, unspecified: Secondary | ICD-10-CM | POA: Diagnosis not present

## 2024-01-04 DIAGNOSIS — R220 Localized swelling, mass and lump, head: Secondary | ICD-10-CM | POA: Diagnosis not present

## 2024-01-04 DIAGNOSIS — I511 Rupture of chordae tendineae, not elsewhere classified: Secondary | ICD-10-CM | POA: Diagnosis not present

## 2024-01-04 DIAGNOSIS — Z006 Encounter for examination for normal comparison and control in clinical research program: Secondary | ICD-10-CM | POA: Diagnosis not present

## 2024-01-04 DIAGNOSIS — R221 Localized swelling, mass and lump, neck: Secondary | ICD-10-CM | POA: Diagnosis not present

## 2024-01-04 DIAGNOSIS — Z9889 Other specified postprocedural states: Secondary | ICD-10-CM

## 2024-01-04 DIAGNOSIS — N3281 Overactive bladder: Secondary | ICD-10-CM | POA: Insufficient documentation

## 2024-01-04 DIAGNOSIS — Z8616 Personal history of COVID-19: Secondary | ICD-10-CM | POA: Diagnosis not present

## 2024-01-04 DIAGNOSIS — R930 Abnormal findings on diagnostic imaging of skull and head, not elsewhere classified: Secondary | ICD-10-CM | POA: Diagnosis not present

## 2024-01-04 DIAGNOSIS — I6523 Occlusion and stenosis of bilateral carotid arteries: Secondary | ICD-10-CM | POA: Diagnosis not present

## 2024-01-04 DIAGNOSIS — I700 Atherosclerosis of aorta: Secondary | ICD-10-CM | POA: Diagnosis not present

## 2024-01-04 DIAGNOSIS — H538 Other visual disturbances: Secondary | ICD-10-CM | POA: Diagnosis not present

## 2024-01-04 LAB — CREATININE, SERUM
Creatinine, Ser: 1.05 mg/dL — ABNORMAL HIGH (ref 0.44–1.00)
GFR, Estimated: 54 mL/min — ABNORMAL LOW (ref >60)

## 2024-01-04 LAB — CBC
HCT: 32.8 % — ABNORMAL LOW (ref 36.0–46.0)
Hemoglobin: 10.9 g/dL — ABNORMAL LOW (ref 12.0–15.0)
MCH: 30.4 pg (ref 26.0–34.0)
MCHC: 33.2 g/dL (ref 30.0–36.0)
MCV: 91.6 fL (ref 80.0–100.0)
Platelets: 156 10*3/uL (ref 150–400)
RBC: 3.58 MIL/uL — ABNORMAL LOW (ref 3.87–5.11)
RDW: 13.9 % (ref 11.5–15.5)
WBC: 6.5 10*3/uL (ref 4.0–10.5)
nRBC: 0 % (ref 0.0–0.2)

## 2024-01-04 LAB — ECHOCARDIOGRAM COMPLETE
AR max vel: 2.62 cm2
AV Area VTI: 2.64 cm2
AV Area mean vel: 2.5 cm2
AV Mean grad: 7 mm[Hg]
AV Peak grad: 12.7 mm[Hg]
Ao pk vel: 1.79 m/s
Area-P 1/2: 1.42 cm2
Height: 67 in
MV VTI: 2 cm2
P 1/2 time: 769 ms
S' Lateral: 3.3 cm
Weight: 2754.87 [oz_av]

## 2024-01-04 LAB — LIPID PANEL
Cholesterol: 160 mg/dL (ref 0–200)
HDL: 52 mg/dL (ref >40)
LDL Cholesterol: 92 mg/dL (ref 0–99)
Total CHOL/HDL Ratio: 3.1 {ratio}
Triglycerides: 80 mg/dL (ref <150)
VLDL: 16 mg/dL (ref 0–40)

## 2024-01-04 LAB — HEMOGLOBIN A1C
Hgb A1c MFr Bld: 4.7 % — ABNORMAL LOW (ref 4.8–5.6)
Mean Plasma Glucose: 88.19 mg/dL

## 2024-01-04 MED ORDER — STROKE: EARLY STAGES OF RECOVERY BOOK
Freq: Once | Status: AC
  Filled 2024-01-04: qty 1

## 2024-01-04 MED ORDER — IOHEXOL 350 MG/ML SOLN
75.0000 mL | Freq: Once | INTRAVENOUS | Status: AC | PRN
  Administered 2024-01-04: 75 mL via INTRAVENOUS

## 2024-01-04 MED ORDER — DIAZEPAM 5 MG PO TABS
5.0000 mg | ORAL_TABLET | Freq: Once | ORAL | Status: AC
  Administered 2024-01-04: 5 mg via ORAL
  Filled 2024-01-04: qty 1

## 2024-01-04 NOTE — TOC CM/SW Note (Signed)
Transition of Care [facility]) - Inpatient Brief Assessment   Patient Details  Name: [name] MRN: [id number] Date of Birth: [birth year]  Transition of [facility]) CM/SW Contact:    Harriet Masson, RN Phone Number: 01/04/2024, 12:19 PM   Clinical Narrative:  Patient admitted for TRANSCATHETER MITRAL EDGE TO EDGE REPAIR. Patient developed acute vision loss in her L eye. CT of head is negative. MRI today.  No TOC needs at this time.   Transition of Care Asessment: Insurance and Status: Insurance coverage has been reviewed Patient has primary care physician: Yes Home environment has been reviewed: safe to discharge home Prior level of function:: independent Prior/Current Home Services: No current home services Social Drivers of Health Review: SDOH reviewed no interventions necessary Readmission risk has been reviewed: Yes Transition of care needs: no transition of care needs at this time

## 2024-01-04 NOTE — Progress Notes (Signed)
Called to room by pt stating that "my left eye went black'" within just a couple mins she had complete vision again- Both eyes equal and reactive- BP 132/70, HR 61, no other symptoms. Notified David from [facility] to please come to assess- pt was still asymptomatic- Notified Cardiology on call Lendell Caprice with this information. Will continue to monitor patient.

## 2024-01-04 NOTE — Consult Note (Signed)
NEUROLOGY CONSULT NOTE   Date of service: January 04, 2024 Patient Name: [name] MRN:  [id number] DOB:  [birth year] Chief Complaint: "embolic stroke" Requesting Provider: Orbie Pyo, MD  History of Present Illness  [name] is a 78 y.o. female with hx of severe mitral insufficiency, HTN, thyroid cancer and resultant hypothyroidism, GERD who is admitted for and underwent valve repair and had an episode of L eye vision loss for 20 secs with full and spontaneous resolution in about a minute.  She had workup with MRI Brain w/o contrast which demonstrated scattered embolic appearing infarcts.  She denies any prior history of strokes, she does report that her mother had strokes, she does not smoke, does not drink alcohol, does not use any recreational substances.  She denies any history of diabetes, hypertension, hyperlipidemia.  LKW: 0200 on 01/04/24 Modified rankin score: 0-Completely asymptomatic and back to baseline post- stroke IV Thrombolysis: not offered, resolution of symptoms. EVT: not offered 2/2 resolution of symptoms.  NIHSS components Score: Comment  1a Level of Conscious 0[x]  1[]  2[]  3[]      1b LOC Questions 0[x]  1[]  2[]       1c LOC Commands 0[x]  1[]  2[]       2 Best Gaze 0[x]  1[]  2[]       3 Visual 0[x]  1[]  2[]  3[]      4 Facial Palsy 0[x]  1[]  2[]  3[]      5a Motor Arm - left 0[x]  1[]  2[]  3[]  4[]  UN[]    5b Motor Arm - Right 0[x]  1[]  2[]  3[]  4[]  UN[]    6a Motor Leg - Left 0[x]  1[]  2[]  3[]  4[]  UN[]    6b Motor Leg - Right 0[x]  1[]  2[]  3[]  4[]  UN[]    7 Limb Ataxia 0[x]  1[]  2[]  3[]  UN[]     8 Sensory 0[x]  1[]  2[]  UN[]      9 Best Language 0[x]  1[]  2[]  3[]      10 Dysarthria 0[x]  1[]  2[]  UN[]      11 Extinct. 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 Lynnette Caffey   Severe mitral insufficiency    Tachycardia     Past Surgical History:  Procedure Laterality Date   ABDOMINAL HYSTERECTOMY     BALLOON DILATION N/A 07/17/2023   Procedure: BALLOON DILATION;  Surgeon: Lanelle Bal, DO;  [facility];  Service: Endoscopy;  Laterality: N/A;   BILATERAL SALPINGOOPHORECTOMY     BREAST EXCISIONAL BIOPSY Bilateral 1995   benign   CHOLECYSTECTOMY     COLONOSCOPY  11/14/2011   Procedure: COLONOSCOPY;  Surgeon: Corbin Ade, MD;  Location: [facility];  Service: Endoscopy;  Laterality: N/A;   ESOPHAGOGASTRODUODENOSCOPY (EGD) WITH PROPOFOL N/A 07/17/2023   Procedure: ESOPHAGOGASTRODUODENOSCOPY (EGD) WITH L;  Surgeon: Lanelle Bal, DO;  Location: [facility];  Service: Endoscopy;  Laterality: N/A;   REPLACEMENT TOTAL KNEE BILATERAL     RIGHT/LEFT HEART CATH AND CORONARY ANGIOGRAPHY N/A 11/16/2023   Procedure: RIGHT/LEFT HEART CATH AND CORONARY ANGIOGRAPHY;  Surgeon: Orbie Pyo, MD;  [facility];  Service: Cardiovascular;  Laterality: N/A;   ROUX-EN-Y PROCEDURE  2007   TEE WITHOUT CARDIOVERSION N/A 11/11/2022   Procedure: TRANSESOPHAGEAL ECHOCARDIOGRAM (TEE);  Surgeon: Marjo Bicker, MD;  Location: [facility];  Service: Cardiovascular;  Laterality: N/A;   TONSILLECTOMY     TOTAL HIP ARTHROPLASTY Right 12/23/2019   Procedure: RIGHT TOTAL HIP ARTHROPLASTY ANTERIOR APPROACH;  Surgeon: Tarry Kos, MD;  Location: [facility];  Service: Orthopedics;  Laterality: Right;   TOTAL HIP ARTHROPLASTY Left 11/21/2022    Procedure: LEFT TOTAL HIP ARTHROPLASTY ANTERIOR APPROACH;  Surgeon: Tarry Kos, MD;  Location: [facility];  Service: Orthopedics;  Laterality: Left;  3-C   TOTAL THYROIDECTOMY  12/2010   TRANSCATHETER MITRAL EDGE TO EDGE REPAIR N/A 01/03/2024   Procedure: TRANSCATHETER MITRAL EDGE TO EDGE REPAIR;  Surgeon: Orbie Pyo, MD;  [facility];  Service: Cardiovascular;  Laterality: N/A;   TRANSESOPHAGEAL ECHOCARDIOGRAM (CATH LAB) N/A 10/27/2023   Procedure: TRANSESOPHAGEAL ECHOCARDIOGRAM;  Surgeon: Wendall Stade, MD;  Location: [facility] INVASIVE CV LAB;  Service: Cardiovascular;  Laterality: N/A;   TRANSESOPHAGEAL ECHOCARDIOGRAM (CATH LAB) N/A 01/03/2024   Procedure: TRANSESOPHAGEAL ECHOCARDIOGRAM;  Surgeon: Orbie Pyo, MD;  Location: [facility] INVASIVE CV LAB;  Service: Cardiovascular;  Laterality: N/A;   TUBAL LIGATION      Family History: Family History  Problem Relation Age of Onset   Breast cancer Mother    Pneumonia Mother 64   CAD Father 73   Breast cancer Sister    CAD Sister 55   Other Maternal Grandmother 78   CAD Brother 79   Other Brother 71       drug overdose   CAD Other 17   Colon cancer Neg Hx     Social History  reports that she has never smoked. She has never used smokeless tobacco. She reports that she does not drink alcohol and does not use drugs.  Allergies  Allergen Reactions   Codeine Nausea And Vomiting   Demerol Nausea And Vomiting   Nsaids Other (See Comments)    Due to gastric bypass    Medications   Current Facility-Administered Medications:    acetaminophen (TYLENOL) tablet 650 mg, 650 mg, Oral, Q4H PRN, Georgie Chard D, NP, 650 mg at 01/04/24 6962   aspirin chewable tablet 81 mg, 81 mg, Oral, Daily, Georgie Chard D, NP, 81 mg at 01/04/24 0857   clopidogrel (PLAVIX) tablet 75 mg, 75 mg, Oral, Q breakfast, Georgie Chard D, NP, 75 mg at 01/04/24 0857   enalapril (VASOTEC) tablet 10 mg, 10 mg, Oral, Daily, Georgie Chard D, NP, 10 mg at  01/04/24 0857   ferrous sulfate tablet 325 mg, 325 mg, Oral, Daily, Georgie Chard D, NP, 325 mg at 01/04/24 0858   heparin injection 5,000 Units, 5,000 Units, Subcutaneous, Q8H, Georgie Chard D, NP, 5,000 Units at 01/04/24 9528   hydrALAZINE (APRESOLINE) injection 5 mg, 5 mg, Intravenous, Q20 Min PRN, Georgie Chard D, NP   labetalol (NORMODYNE) injection 10 mg, 10 mg, Intravenous, Q10 min PRN, Georgie Chard D, NP   levothyroxine (SYNTHROID) tablet 150 mcg, 150 mcg, Oral, Q0600, Georgie Chard D, NP, 150 mcg at  2024-02-01 0619   metoprolol succinate (TOPROL-XL) 24 hr tablet 12.5 mg, 12.5 mg, Oral, Daily, Georgie Chard D, NP, 12.5 mg at 01-Feb-2024 0857   ondansetron (ZOFRAN) injection 4 mg, 4 mg, Intravenous, Q6H PRN, Filbert Schilder, NP   Oral care mouth rinse, 15 mL, Mouth Rinse, PRN, Orbie Pyo, MD   oxyCODONE-acetaminophen (PERCOCET/ROXICET) 5-325 MG per tablet 1 tablet, 1 tablet, Oral, Q4H PRN, Tonny Bollman, MD   sodium chloride flush (NS) 0.9 % injection 3 mL, 3 mL, Intravenous, Q12H, Georgie Chard D, NP, 3 mL at 02/01/2024 0858   sodium chloride flush (NS) 0.9 % injection 3 mL, 3 mL, Intravenous, PRN, Georgie Chard D, NP  Vitals   Vitals:   02-01-2024 0541 01-Feb-2024 0715 01-Feb-2024 0857 02/01/2024 1125  BP: 124/68 136/72 136/71 134/74  Pulse: 60 (!) 56 62 (!) 56  Resp: 13 17  20   Temp: 97.9 F (36.6 C) 97.8 F (36.6 C)  98.2 F (36.8 C)  TempSrc: Oral Oral  Oral  SpO2: 96% 94%  97%  Weight:      Height:        Body mass index is 26.97 kg/m.  Physical Exam   General: Laying comfortably in bed; in no acute distress.  HENT: Normal oropharynx and mucosa. Normal external appearance of ears and nose.  Neck: Supple, no pain or tenderness  CV: No JVD. No peripheral edema.  Pulmonary: Symmetric Chest rise. Normal respiratory effort.  Abdomen: Soft to touch, non-tender.  Ext: No cyanosis, edema, or deformity  Skin: No rash. Normal palpation of skin.   Musculoskeletal: Normal  digits and nails by inspection. No clubbing.   Neurologic Examination  Mental status/Cognition: Alert, oriented to self, place, month and year, good attention.  Speech/language: Fluent, comprehension intact, object naming intact, repetition intact.  Cranial nerves:   CN II Pupils equal and reactive to light, no VF deficits    CN III,IV,VI EOM intact, no gaze preference or deviation, no nystagmus    CN V normal sensation in V1, V2, and V3 segments bilaterally    CN VII no asymmetry, no nasolabial fold flattening    CN VIII normal hearing to speech    CN IX & X normal palatal elevation, no uvular deviation    CN XI 5/5 head turn and 5/5 shoulder shrug bilaterally    CN XII midline tongue protrusion    Motor:  Muscle bulk: normal, tone normal, pronator drift none tremor none Mvmt Root Nerve  Muscle Right Left Comments  SA C5/6 Ax Deltoid 5 5   EF C5/6 Mc Biceps 5 4+   EE C6/7/8 Rad Triceps 5 4+   WF C6/7 Med FCR     WE C7/8 PIN ECU     F Ab C8/T1 U ADM/FDI 5 4+   HF L1/2/3 Fem Illopsoas 5 4+   KE L2/3/4 Fem Quad 5 5   DF L4/5 D Peron Tib Ant 5 5   PF S1/2 Tibial Grc/Sol 5 5    Sensation:  Light touch Intact throughout   Pin prick    Temperature    Vibration   Proprioception    Coordination/Complex Motor:  - Finger to Nose intac BL - Heel to shin intact BL - Rapid alternating movement are slowed on he left - Gait: deferred.   Labs/Imaging/Neurodiagnostic studies   CBC:  Recent Labs  Lab February 01, 2024 0217  WBC 6.5  HGB 10.9*  HCT 32.8*  MCV 91.6  PLT 156   Basic Metabolic Panel:  Lab Results  Component Value Date   NA 142 12/26/2023   K 3.9 12/26/2023   CO2 20 12/26/2023   GLUCOSE 87 12/26/2023   BUN 15 12/26/2023   CREATININE 1.05 (H) 01/04/2024   CALCIUM 9.2 12/26/2023   GFRNONAA 54 (L) 01/04/2024   GFRAA 75 05/06/2020   Lipid Panel:  Lab Results  Component Value Date   LDLCALC 90 02/07/2023   HgbA1c: No results found for: "HGBA1C" Urine Drug Screen:  No results found for: "LABOPIA", "COCAINSCRNUR", "LABBENZ", "AMPHETMU", "THCU", "LABBARB"  Alcohol Level No results found for: "ETH" INR  Lab Results  Component Value Date   INR 1.0 12/26/2023   APTT  Lab Results  Component Value Date   APTT 32 12/19/2019   AED levels: No results found for: "PHENYTOIN", "ZONISAMIDE", "LAMOTRIGINE", "LEVETIRACETA"  CT Head without contrast(Personally reviewed): CTH was negative for a large hypodensity concerning for a large territory infarct or hyperdensity concerning for an ICH  CT angio Head and Neck with contrast(Personally reviewed): pending  MRI Brain(Personally reviewed): Scattered punctate acute infarcts in multiple vascular territories, including the left cerebellum, right occipital lobe, left parietal lobe, and right frontal lobe. Findings are favored to be secondary to a central embolic etiology. No hemorrhage.  ASSESSMENT   [name] is a 78 y.o. female with hx of severe mitral insufficiency, HTN, thyroid cancer and resultant hypothyroidism, GERD who is admitted for and underwent valve repair and had an episode of L eye vision loss for 20 secs with full and spontaneous resolution in about a minute.  She was found to have scattered punctate stroke with multiple vascular territories concerning for a central embolic etiology.  I suspect that her periprocedural strokes are probably related to surgery.  Will get stroke workup to look for and rule out other etiologies.  RECOMMENDATIONS  - Frequent Neuro checks per stroke unit protocol - Recommend Vascular imaging with head and neck. -TTE with EF of 60 to 65%.  Interatrial septum was not well-visualized. - Recommend obtaining Lipid panel with LDL - Please start statin if LDL > 70 - Recommend HbA1c to evaluate for diabetes and how well it is controlled. - Antithrombotic -aspirin 81 mg daily along with Plavix and family comes daily for 21 days, followed by aspirin 81 mg daily  alone. - Recommend DVT ppx - SBP goal - permissive hypertension first 24 h < 220/110. Held home meds.  - Recommend Telemetry monitoring for arrythmia - Recommend bedside swallow screen prior to PO intake. - Stroke education booklet - Recommend PT/OT/SLP consult  ______________________________________________________________________    Signed, Erick Blinks, MD [facility]

## 2024-01-04 NOTE — Progress Notes (Signed)
  Echocardiogram 2D Echocardiogram has been performed.  Reinaldo Raddle Sophira Rumler 01/04/2024, 11:28 AM

## 2024-01-04 NOTE — Progress Notes (Signed)
 Name: [name] DOB: 21-Jun-1946 MRN: [id number]  History of Present Illness: Ms. Lehrmann is a 78 y.o. female who presents today for follow up visit at [facility].  - GU history: 1. OAB with urinary frequency, urgency, and urge incontinence. - Failed Myrbetriq , Gemtesa , Oxybutynin . - 06/23/2023: Had cystoscopy with bladder Botox  (100 units) by Dr. Sherrilee.   At last visit on 07/03/2023: Reported significant OAB symptom improvement since the Botox  procedure on 06/23/2023.   Today: She reports that her urinary symptoms have gradually worsened over the past few months. She is now having urge incontinence multiple times per day and needing to change her pain about 3x/day. Also having increased urinary frequency, nocturia, urgency. Voiding 3x/night on average.   She denies dysuria, gross hematuria, straining to void, or sensations of incomplete emptying.  Fall Screening: Do you usually have a device to assist in your mobility? No   Medications: Current Outpatient Medications  Medication Sig Dispense Refill   amoxicillin  (AMOXIL ) 500 MG tablet TAKE 4 TABLETS BY MOUTH 1 HOUR PRIOR TO DENTAL PROCEDURES 12 tablet 12   aspirin  81 MG chewable tablet Chew 1 tablet (81 mg total) by mouth daily.     Biotin 5000 MCG CAPS Take 5,000 mcg by mouth daily.     Calcium  Citrate-Vitamin D  (CALCIUM  CITRATE+D3 PO) Take 1 tablet by mouth daily.     carboxymethylcellulose (REFRESH PLUS) 0.5 % SOLN Place 1 drop into both eyes 3 (three) times daily as needed (Dry eye).     Cholecalciferol  (VITAMIN D3 ULTRA STRENGTH) 125 MCG (5000 UT) capsule Take 5,000 Units by mouth daily.     clopidogrel  (PLAVIX ) 75 MG tablet Take 1 tablet (75 mg total) by mouth daily with breakfast. 60 tablet 2   Cyanocobalamin  5000 MCG TBDP Take 5,000 mcg by mouth daily.     enalapril  (VASOTEC ) 10 MG tablet Take 1 tablet (10 mg total) by mouth daily. 90 tablet 0   ferrous sulfate  325 (65 FE) MG tablet Take 325 mg  by mouth daily.     levothyroxine  (SYNTHROID ) 150 MCG tablet Take 1 tablet (150 mcg total) by mouth daily before breakfast. (Patient taking differently: Take 150 mcg by mouth See admin instructions. *MUST TAKE BRAND* Take 150 mg daily except skip dose on Sundays) 30 tablet 0   Magnesium  250 MG CAPS Take 250 mg by mouth daily.     metoprolol  tartrate (LOPRESSOR ) 25 MG tablet Take 1 tablet (25 mg total) by mouth 2 (two) times daily as needed.     Multiple Vitamin (MULTIVITAMIN WITH MINERALS) TABS tablet Take 1 tablet by mouth in the morning and at bedtime. Woman 50+     pantoprazole  (PROTONIX ) 40 MG tablet Take 1 tablet (40 mg total) by mouth daily. 60 tablet 1   promethazine -dextromethorphan (PROMETHAZINE -DM) 6.25-15 MG/5ML syrup Take 5 mLs by mouth 4 (four) times daily as needed for cough. 118 mL 0   rosuvastatin  (CRESTOR ) 20 MG tablet Take 1 tablet (20 mg total) by mouth daily. 60 tablet 2   valACYclovir  (VALTREX ) 1000 MG tablet TAKE 2 TABLETS AT FIRST SIGN OF FEVER BLISTER, THEN 2 TABLETS 2 HOURSLATER 4 tablet 5   Zinc 50 MG TABS Take 50 mg by mouth daily.     No current facility-administered medications for this visit.    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 Wendel   Severe mitral insufficiency    Tachycardia    Past Surgical History:  Procedure  Laterality Date   ABDOMINAL HYSTERECTOMY     BALLOON DILATION N/A 07/17/2023   Procedure: BALLOON DILATION;  Surgeon: Cindie Carlin POUR, DO;  [facility];  Service: Endoscopy;  Laterality: N/A;   BILATERAL SALPINGOOPHORECTOMY     BREAST EXCISIONAL BIOPSY Bilateral 1995   benign   CHOLECYSTECTOMY     COLONOSCOPY  11/14/2011   Procedure: COLONOSCOPY;  Surgeon: Lamar CHRISTELLA Hollingshead, MD;  [facility];  Service: Endoscopy;  Laterality: N/A;   ESOPHAGOGASTRODUODENOSCOPY (EGD) WITH PROPOFOL  N/A 07/17/2023   Procedure: ESOPHAGOGASTRODUODENOSCOPY (EGD) WITH L;  Surgeon: Cindie Carlin POUR, DO;  [facility];  Service: Endoscopy;  Laterality: N/A;   REPLACEMENT TOTAL KNEE BILATERAL     RIGHT/LEFT HEART CATH AND CORONARY ANGIOGRAPHY N/A 11/16/2023   Procedure: RIGHT/LEFT HEART CATH AND CORONARY ANGIOGRAPHY;  Surgeon: Wendel Lurena POUR, MD;  [facility];  Service: Cardiovascular;  Laterality: N/A;   ROUX-EN-Y PROCEDURE  2007   TEE WITHOUT CARDIOVERSION N/A 11/11/2022   Procedure: TRANSESOPHAGEAL ECHOCARDIOGRAM (TEE);  Surgeon: Mallipeddi, Vishnu P, MD;  Location: [facility];  Service: Cardiovascular;  Laterality: N/A;   TONSILLECTOMY     TOTAL HIP ARTHROPLASTY Right 12/23/2019   Procedure: RIGHT TOTAL HIP ARTHROPLASTY ANTERIOR APPROACH;  Surgeon: Jerri Kay CHRISTELLA, MD;  Location[facility];  Service: Orthopedics;  Laterality: Right;   TOTAL HIP ARTHROPLASTY Left 11/21/2022   Procedure: LEFT TOTAL HIP ARTHROPLASTY ANTERIOR APPROACH;  Surgeon: Jerri Kay CHRISTELLA, MD;  [facility];  Service: Orthopedics;  Laterality: Left;  3-C   TOTAL THYROIDECTOMY  12/2010   TRANSCATHETER MITRAL EDGE TO EDGE REPAIR N/A 01/03/2024   Procedure: TRANSCATHETER MITRAL EDGE TO EDGE REPAIR;  Surgeon: Wendel Lurena POUR, MD;  [facility];  Service: Cardiovascular;  Laterality: N/A;   TRANSESOPHAGEAL ECHOCARDIOGRAM (CATH LAB) N/A 10/27/2023   Procedure: TRANSESOPHAGEAL  ECHOCARDIOGRAM;  Surgeon: Delford Maude BROCKS, MD;  Location: [facility] INVASIVE CV LAB;  Service: Cardiovascular;  Laterality: N/A;   TRANSESOPHAGEAL ECHOCARDIOGRAM (CATH LAB) N/A 01/03/2024   Procedure: TRANSESOPHAGEAL ECHOCARDIOGRAM;  Surgeon: Wendel Lurena POUR, MD;  [facility];  Service: Cardiovascular;  Laterality: N/A;   TUBAL LIGATION     Family History  Problem Relation Age of Onset   Breast cancer Mother    Pneumonia Mother 73   CAD Father 61   Breast cancer Sister    CAD Sister 16   Other Maternal Grandmother 87   CAD Brother 36   Other Brother 22       drug overdose   CAD Other 32   Colon cancer Neg Hx    Social History   Socioeconomic History   Marital status: Widowed    Spouse name: Not on file  Number of children: Not on file   Years of education: Not on file   Highest education level: Some college, no degree  Occupational History   Occupation: Retired clinical biochemist  Tobacco Use   Smoking status: Never   Smokeless tobacco: Never  Vaping Use   Vaping status: Never Used  Substance and Sexual Activity   Alcohol use: No   Drug use: No   Sexual activity: Not on file  Other Topics Concern   Not on file  Social History Narrative   Not on file   Social Drivers of Health   Financial Resource Strain: Low Risk  (11/22/2023)   Overall Financial Resource Strain (CARDIA)    Difficulty of Paying Living Expenses: Not very hard  Food Insecurity: Low Risk  (01/12/2024)   Received from [facility]   Hunger Vital Sign    Worried About Running Out of Food in the Last Year: Never true    Ran Out of Food in the Last Year: Never true  Transportation Needs: No Transportation Needs (01/12/2024)   Received from [facility]    In the past 12 months, has lack of reliable transportation kept you from medical appointments, meetings, work or from getting things needed for daily living? : No  Physical Activity: Insufficiently Active (11/22/2023)    Exercise Vital Sign    Days of Exercise per Week: 2 days    Minutes of Exercise per Session: 30 min  Stress: No Stress Concern Present (11/22/2023)   [employer] of Occupational Health - Occupational Stress Questionnaire    Feeling of Stress : Not at all  Social Connections: Moderately Integrated (01/03/2024)   Social Connection and Isolation Panel [NHANES]    Frequency of Communication with Friends and Family: More than three times a week    Frequency of Social Gatherings with Friends and Family: Three times a week    Attends Religious Services: More than 4 times per year    Active Member of Clubs or Organizations: Yes    Attends Banker Meetings: More than 4 times per year    Marital Status: Widowed  Intimate Partner Violence: Not At Risk (01/08/2024)   Humiliation, Afraid, Rape, and Kick questionnaire    Fear of Current or Ex-Partner: No    Emotionally Abused: No    Physically Abused: No    Sexually Abused: No    Review of Systems Constitutional: Patient denies any unintentional weight loss or change in strength lntegumentary: Patient denies any rashes or pruritus Cardiovascular: Patient denies chest pain or syncope Respiratory: Patient denies shortness of breath Gastrointestinal: Patient  constipation  Musculoskeletal: Patient denies muscle cramps or weakness Neurologic: Patient denies convulsions or seizures Allergic/Immunologic: Patient denies recent allergic reaction(s) Hematologic/Lymphatic: Patient denies bleeding tendencies Endocrine: Patient denies heat/cold intolerance  GU: As per HPI.  OBJECTIVE Vitals:   01/17/24 1253  BP: (!) 147/85  Pulse: 73   There is no height or weight on file to calculate BMI.  Physical Examination Constitutional: No obvious distress; patient is non-toxic appearing  Cardiovascular: No visible lower extremity edema.  Respiratory: The patient does not have audible wheezing/stridor; respirations do not appear labored   Gastrointestinal: Abdomen non-distended Musculoskeletal: Normal ROM of UEs  Skin: No obvious rashes/open sores  Neurologic: CN 2-12 grossly intact Psychiatric: Answered questions appropriately with normal affect  Hematologic/Lymphatic/Immunologic: No obvious bruises or sites of spontaneous bleeding  Urine microscopy: with no evidence of UTI or microscopic hematuria PVR: 0 ml  ASSESSMENT Urge  incontinence - Plan: Urinalysis, Routine w reflex microscopic, BLADDER SCAN AMB NON-IMAGING  OAB (overactive bladder) - Plan: Urinalysis, Routine w reflex microscopic, BLADDER SCAN AMB NON-IMAGING  We discussed that her increased OAB symptoms at this time correlate with the anticipated time from for repeating bladder Botox  procedure. She elected to do that and requests to have increased amount of Botox  this time (200 ml) in hopes of improved efficacy. We reviewed the risk for temporary urinary retention / need for catheter afterwards. Patient verbalized understanding and agreement. All questions were answered.  PLAN Advised the following: 1. Return for 1st available office cystoscopy & bladder Botox  procedure with Dr. Sherrilee (pt prefers 200 units).  Orders Placed This Encounter  Procedures   Urinalysis, Routine w reflex microscopic   BLADDER SCAN AMB NON-IMAGING    It has been explained that the patient is to follow regularly with their PCP in addition to all other providers involved in their care and to follow instructions provided by these respective offices. Patient advised to contact urology clinic if any urologic-pertaining questions, concerns, new symptoms or problems arise in the interim period.  There are no Patient Instructions on file for this visit.  Electronically signed by:  Lauraine JAYSON Oz, FNP   01/17/24    1:26 PM

## 2024-01-04 NOTE — Progress Notes (Signed)
CARDIAC REHAB PHASE I   Pt Mitral valve clip education including restrictions, exercise guidelines, heart healthy diet, site care and CRP2 reviewed. All questions and concerns addressed. Will refer to [facility] for CRP2.   [id number] Woodroe Chen, RN BSN 01/04/2024 12:02 PM

## 2024-01-04 NOTE — Progress Notes (Addendum)
[facility]   MULTIDISCIPLINARY HEART VALVE TEAM  Patient Name: [name] Date of Encounter: 01/04/2024  Primary Cardiologist: Dr. Dietrich Pates, MD/ Dr. Alverda Skeans, MD [facility] Problem List     Principal Problem:   S/P mitral valve clip implantation Active Problems:   Essential hypertension, benign   Hyperlipidemia   Severe mitral regurgitation   Transient monocular blindness, left  Subjective   Patient up to chair this AM and feels back to baseline. Discussed MRI brain and CTA head/neck. Denies recurrent visual changes. No chest pain, SOB.   Inpatient Medications    Scheduled Meds:  aspirin  81 mg Oral Daily   clopidogrel  75 mg Oral Q breakfast   diazepam  5 mg Oral Once   enalapril  10 mg Oral Daily   ferrous sulfate  325 mg Oral Daily   heparin injection (subcutaneous)  5,000 Units Subcutaneous Q8H   levothyroxine  150 mcg Oral Q0600   metoprolol succinate  12.5 mg Oral Daily   sodium chloride flush  3 mL Intravenous Q12H   Continuous Infusions:  PRN Meds: acetaminophen, hydrALAZINE, labetalol, ondansetron (ZOFRAN) IV, mouth rinse, oxyCODONE-acetaminophen, sodium chloride flush   Vital Signs    Vitals:   01/03/24 2326 01/04/24 0541 01/04/24 0715 01/04/24 0857  BP: 121/64 124/68 136/72 136/71  Pulse: 61 60 (!) 56 62  Resp: 14 13 17    Temp: 98.9 F (37.2 C) 97.9 F (36.6 C) 97.8 F (36.6 C)   TempSrc: Oral Oral Oral   SpO2: 95% 96% 94%   Weight:      Height:        Intake/Output Summary (Last 24 hours) at 01/04/2024 0951 Last data filed at 01/04/2024 0858 Gross per 24 hour  Intake 985 ml  Output 1920 ml  Net -935 ml   Filed Weights   01/02/24 1040 01/03/24 0635 01/03/24 1734  Weight: 75.3 kg 75.3 kg 78.1 kg   Physical Exam    General: Well developed, well nourished, NAD Lungs:Clear to ausculation bilaterally. No wheezes, rales, or rhonchi. Breathing is unlabored. Cardiovascular: RRR with S1 S2. No  murmurs Abdomen: Soft, non-tender, non-distended. No obvious abdominal masses. MSK: Strength and tone appear normal for age.  Extremities: No edema. Groin site stable.  Neuro: Alert and oriented. No focal deficits. No facial asymmetry. MAE spontaneously. Psych: Responds to questions appropriately with normal affect.    Labs    CBC Recent Labs    01/04/24 0217  WBC 6.5  HGB 10.9*  HCT 32.8*  MCV 91.6  PLT 156   Basic Metabolic Panel Recent Labs    16/10/96 0217  CREATININE 1.05*   Liver Function Tests No results for input(s): "AST", "ALT", "ALKPHOS", "BILITOT", "PROT", "ALBUMIN" in the last 72 hours. No results for input(s): "LIPASE", "AMYLASE" in the last 72 hours. Cardiac Enzymes No results for input(s): "CKTOTAL", "CKMB", "CKMBINDEX", "TROPONINI" in the last 72 hours. BNP Invalid input(s): "POCBNP" D-Dimer No results for input(s): "DDIMER" in the last 72 hours. Hemoglobin A1C No results for input(s): "HGBA1C" in the last 72 hours. Fasting Lipid Panel No results for input(s): "CHOL", "HDL", "LDLCALC", "TRIG", "CHOLHDL", "LDLDIRECT" in the last 72 hours. Thyroid Function Tests No results for input(s): "TSH", "T4TOTAL", "T3FREE", "THYROIDAB" in the last 72 hours.  Invalid input(s): "FREET3"  Telemetry    01/04/24 NSR/SB with rates in the high 50's-60's - Personally Reviewed  ECG    No new tracing as of 01/04/24 - Personally Reviewed  Radiology    CT HEAD WO CONTRAST ( ) Result Date: 01/04/2024 CLINICAL DATA:  78 year old female with monocular vision loss. Postoperative day 1 mitral valve repair. EXAM: CT HEAD WITHOUT CONTRAST TECHNIQUE: Contiguous axial images were obtained from the base of the skull through the vertex without intravenous contrast. RADIATION DOSE REDUCTION: This exam was performed according to the departmental dose-optimization program which includes automated exposure control, adjustment of the mA and/or kV according to patient size and/or use of  iterative reconstruction technique. COMPARISON:  None Available. FINDINGS: Brain: Cerebral volume is within normal limits for age. No midline shift, ventriculomegaly, mass effect, evidence of mass lesion, intracranial hemorrhage or evidence of cortically based acute infarction. Preserved bilateral gray-white differentiation with mild for age mostly subcortical white matter hypodensity. Normal noncontrast CT appearance of the suprasellar cistern, cavernous sinus. Vascular: Calcified atherosclerosis at the skull base. No suspicious intracranial vascular hyperdensity. Skull: Intact.  No acute osseous abnormality identified. Sinuses/Orbits: Visualized paranasal sinuses and mastoids are clear. Other: Orbits soft tissues appears symmetric, unremarkable. Visualized scalp soft tissues are within normal limits. IMPRESSION: 1. No acute intracranial abnormality. Mild for age white matter changes most commonly due to small vessel disease. 2. Unremarkable noncontrast CT appearance of the orbits. Electronically Signed   By: Odessa Fleming M.D.   On: 01/04/2024 06:12   LONG TERM MONITOR (3-14 DAYS) Result Date: 01/03/2024 Patch Wear Time:  8 days and 6 hours (2025-01-08T12:24:29-0500 to 2025-01-16T18:48:04-499) Patient had a min HR of 45 bpm, max HR of 167 bpm, and avg HR of 62 bpm. Predominant underlying rhythm was Sinus Rhythm. EVENTS: -71 Supraventricular Tachycardia runs occurred, the run with the fastest interval lasting 7 beats with a max rate of 167 bpm, the longest lasting 13 beats with an avg rate of 116 bpm. Some episodes of Supraventricular Tachycardia may be possible Atrial Tachycardia with variable block. -Isolated SVEs were rare (<1.0%), SVE Couplets were rare (<1.0%), and SVE Triplets were rare (<1.0%). Isolated VEs were rare (<1.0%), and no VE Couplets or VE Triplets were present. Difficulty discerning atrial activity during periods of high heart rate, making definitive diagnosis between Sinus Tachycardia and Atrial  Tachycardia difficult to ascertain. No atrial fibrillation, sustained ventricular tachyarrhythmias, or bradyarrhythmias were detected. No patient triggered events.   ECHO TEE Result Date: 01/03/2024    TRANSESOPHOGEAL ECHO REPORT   Patient Name:   [name] Date of Exam: 01/03/2024 Medical Rec #:  [id number]         Height:       67.0 in Accession #:    [id number]        Weight:       166.0 lb Date of Birth:  May 18, 1946         BSA:          1.868 m Patient Age:    77 years          BP:           151/80 mmHg Patient Gender: F                 HR:           62 bpm. Exam Location:  Inpatient Procedure: Transesophageal Echo, 3D Echo, Cardiac Doppler and Color Doppler Indications:     MitraClip  History:         Patient has prior history of Echocardiogram examinations, most                  recent 10/27/2023. Mitral  Valve Disease, Signs/Symptoms:Murmur;                  Risk Factors:Hypertension.  Sonographer:     Darlys Gales Referring Phys:  [id number] Orbie Pyo Diagnosing Phys: Riley Lam MD PROCEDURE: After discussion of the risks and benefits of a TEE, an informed consent was obtained from the patient. The transesophogeal probe was passed without difficulty through the esophogus of the patient. Sedation performed by different physician. The patient developed no complications during the procedure. Gingival bleeding with chipped tooth prior to probe placement. No change in dentition post procecure. Bleeding stopped prior to heparinzation.  IMPRESSIONS  1. Prior to procedure- bi-leaflet prolapse with posterior flail and severe mitral regurgitation. Systolic blunting of pulmonary vein flow and post intubation loading conditions. Sufficiency height and leaflet length. Mean gradient of 1 mmHg with mitral valve area 6.14 cm2. Suitable for mTEER.     During procedure, difficult transeptal puncture. Successful inferior, posterior puncture on the thin portion of the septum.     A MitraClip XTW was placed  medial to the A2/P2 position with 3D support. Mild mitral regurgitation was left with a double orifice area of 6.36 cm2. Mean gradient of 3. A second XTW clip was placed parallel to the first clip at the A2/P2 position with  3D support. Mean gradient of 6 mmHg. Trivial MR. LVOT VTI has doubled. Normalization of pulmonary vein flow in the left and right sided veins.     After the procedure left to right ASD flow noted. Trival pericardial effusion is unchanged. Succesfull mTEER placement.. The mitral valve has been repaired/replaced. Severe mitral valve regurgitation.  2. Left ventricular ejection fraction, by estimation, is 55 to 60%. The left ventricle has normal function.  3. Right ventricular systolic function is normal. The right ventricular size is normal.  4. Left atrial size was moderately dilated. No left atrial/left atrial appendage thrombus was detected.  5. Right atrial size was moderately dilated.  6. The aortic valve is tricuspid. Aortic valve regurgitation is trivial. No aortic stenosis is present.  7. 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  8. Evidence of atrial level shunting detected by color flow Doppler. FINDINGS  Left Ventricle: Left ventricular ejection fraction, by estimation, is 55 to 60%. The left ventricle has normal function. The left ventricular internal cavity size was normal in size. Right Ventricle: The right ventricular size is normal. No increase in right ventricular wall thickness. Right ventricular systolic function is normal. Left Atrium: Left atrial size was moderately dilated. No left atrial/left atrial appendage thrombus was detected. Right Atrium: Right atrial size was moderately dilated. Pericardium: Trivial pericardial effusion is present. The pericardial effusion is circumferential. Mitral Valve: Prior to procedure- bi-leaflet prolapse with posterior flail and severe mitral regurgitation. Systolic blunting of pulmonary vein flow and post  intubation loading conditions. Sufficiency height and leaflet length. Mean gradient of 1 mmHg with mitral valve area 6.14 cm2. Suitable for mTEER. During procedure, difficult transeptal puncture. Successful inferior, posterior puncture on the thin portion of the septum. A MitraClip XTW was placed medial to the A2/P2 position with 3D support. Mild mitral regurgitation was left with a double orifice area of 6.36 cm2. Mean gradient of 3. A second XTW clip was placed parallel to the first clip at the A2/P2 position with 3D support. Mean gradient of 6 mmHg. Trivial MR. LVOT VTI has doubled. Normalization of pulmonary vein flow in the left and right sided veins. After the procedure left  to right ASD flow noted. Trival pericardial effusion is unchanged. Succesfull mTEER placement. The mitral valve has been repaired/replaced. Severe mitral valve regurgitation. MV peak gradient, 6.8 mmHg. The mean mitral valve gradient is 3.5 mmHg. Tricuspid Valve: The tricuspid valve is normal in structure. Tricuspid valve regurgitation is mild . No evidence of tricuspid stenosis. Aortic Valve: The aortic valve is tricuspid. Aortic valve regurgitation is trivial. No aortic stenosis is present. Pulmonic Valve: The pulmonic valve was not well visualized. Pulmonic valve regurgitation is not visualized. Aorta: The aortic root, ascending aorta and aortic arch are all structurally normal, with no evidence of dilitation or obstruction. There is moderate (Grade III) plaque involving the ascending aorta. IAS/Shunts: Evidence of atrial level shunting detected by color flow Doppler.  AORTIC VALVE LVOT Vmax:   117.25 cm/s LVOT Vmean:  85.325 cm/s LVOT VTI:    0.252 m MITRAL VALVE MV Peak grad: 6.8 mmHg  SHUNTS MV Mean grad: 3.5 mmHg  Systemic VTI: 0.25 m MV Vmax:      1.30 m/s MV Vmean:     85.0 cm/s Riley Lam MD Electronically signed by Riley Lam MD Signature Date/Time: 01/03/2024/12:55:18 PM    Final    EP STUDY Result Date:  01/03/2024 See surgical note for result.  Structural Heart Procedure Result Date: 01/03/2024 See surgical note for result.  DG Chest 2 View Result Date: 01/03/2024 CLINICAL DATA:  78 year old female preoperative exam. EXAM: CHEST - 2 VIEW COMPARISON:  Chest radiographs 11/22/2023 and earlier. FINDINGS: PA and lateral views 0437 hours. Mild tortuosity of the thoracic aorta. Other mediastinal contours are within normal limits. Visualized tracheal air column is within normal limits. No pneumothorax, pulmonary edema, pleural effusion or consolidation. Lower lobe ventilation on the lateral appears at baseline now since last month. And left lower lung peribronchial opacity has resolved. Contralateral chronic chronic small right infrahilar, peribronchial lung nodule or reticulonodular changes not progressed since 2012 (arrow). Stable cholecystectomy clips. Negative visible bowel gas. No acute osseous abnormality identified. IMPRESSION: 1. Resolved left lower lobe opacity seen last month. 2. Chronic right infrahilar reticulonodular opacity is stable since 2012. 3.  No acute cardiopulmonary abnormality. Electronically Signed   By: Odessa Fleming M.D.   On: 01/03/2024 07:27   Cardiac Studies   Cardiac Studies & Procedures   CARDIAC CATHETERIZATION  CARDIAC CATHETERIZATION 11/16/2023  Narrative 1.  Minimal nonobstructive coronary artery disease. 2.  Fick cardiac output of 7.2 L/min and Fick cardiac index of 3.8 L/min/m with the following hemodynamics: Right atrial pressure mean of 6 mmHg Right ventricular pressure 33/1 with an end-diastolic pressure of 11 mmHg Wedge pressure mean of 13 mmHg with V waves to 18 mmHg PA pressure 34/9 with a mean of 21 mmHg PVR of 1.1 Woods units PA pulsatility index of over 4 3.  LVEDP of 12 mmHg.  Recommendation: Continue evaluation for mitral valve intervention.  Findings Coronary Findings Diagnostic  Dominance: Right  Left Anterior Descending The vessel exhibits  minimal luminal irregularities.  Left Circumflex The vessel exhibits minimal luminal irregularities.  Intervention  No interventions have been documented.   STRESS TESTS  NM MYOCAR MULTI W/SPECT W 12/29/2016  Narrative  There was no ST segment deviation noted during stress.  The study is normal. Inferior defect most consistent with subdiaphragmatic attenuation and gut radiotracer uptake artifact as opposed to true inferior wall ischemia.  This is a low risk study.  The left ventricular ejection fraction is normal (55-65%).  ECHOCARDIOGRAM  ECHOCARDIOGRAM COMPLETE 11/02/2022  Narrative ECHOCARDIOGRAM  REPORT    Patient Name:   [name] Date of Exam: 11/02/2022 Medical Rec #:  [id number]         Height:       67.0 in Accession #:    [id number]        Weight:       172.8 lb Date of Birth:  16-Jul-1946         BSA:          1.900 m Patient Age:    76 years          BP:           138/74 mmHg Patient Gender: F                 HR:           58 bpm. Exam Location:  [facility]  Procedure: 2D Echo, Color Doppler, Cardiac Doppler and Strain Analysis  Indications:    R01.1 Murmur  History:        Patient has prior history of Echocardiogram examinations, most recent 12/28/2016. Signs/Symptoms:Murmur; Risk Factors:Hypertension, Dyslipidemia and Non-Smoker.  Sonographer:    Jake Seats RDMS, RVT, RDCS Referring Phys: [id number] JAYCE G COOK  IMPRESSIONS   1. Left ventricular ejection fraction, by estimation, is 60 to 65%. The left ventricle has normal function. The left ventricle has no regional wall motion abnormalities. Left ventricular diastolic parameters are consistent with Grade II diastolic dysfunction (pseudonormalization). The average left ventricular global longitudinal strain is -29.1 %. 2. Right ventricular systolic function is mildly reduced. The right ventricular size is normal. 3. Left atrial size was severely dilated. 4. Mitral valve leaflets are not adequately  visualized. There appears to be a mobile echodensity 0.59 x 0.89 cm attached possibly to the anterior mitral valve leaflet by a stalk and is noted on the atrial side during systole. Differential diagnoses include chordal rupture, flail mitral valve leaflet, tumor, thrombus, vegetation. Clinical correlation is recommended. Recommend TEE to further evaluate the mitral valve pathology. Moderate eccentric anteriorly directed mitral valve regurgitation. No evidence of mitral stenosis. 5. The aortic valve was not well visualized. Aortic valve regurgitation is not visualized. No aortic stenosis is present. 6. The inferior vena cava is normal in size with greater than 50% respiratory variability, suggesting right atrial pressure of 3 mmHg.  Comparison(s): No prior Echocardiogram.  FINDINGS Left Ventricle: Left ventricular ejection fraction, by estimation, is 60 to 65%. The left ventricle has normal function. The left ventricle has no regional wall motion abnormalities. The average left ventricular global longitudinal strain is -29.1 %. The left ventricular internal cavity size was normal in size. There is no left ventricular hypertrophy. Left ventricular diastolic parameters are consistent with Grade II diastolic dysfunction (pseudonormalization).  Right Ventricle: The right ventricular size is normal. No increase in right ventricular wall thickness. Right ventricular systolic function is mildly reduced.  Left Atrium: Left atrial size was severely dilated.  Right Atrium: Right atrial size was normal in size.  Pericardium: There is no evidence of pericardial effusion.  Mitral Valve: Mitral valve leaflets are not adequately visualized. There appears to be a mobile echodensity 0.59 x 0.89 cm attached possibly to the anterior mitral valve leaflet by a stalk and is noted on the atrial side during systole. Differential diagnoses include chordal rupture, flail mitral valve leaflet, tumor, thrombus, vegetation.  Clinical correlation is recommended. Recommend TEE to further evaluate the mitral valve pathology. Moderate eccentric anteriorly directed mitral valve regurgitation. No evidence of mitral  valve stenosis.  Tricuspid Valve: The tricuspid valve is not well visualized. Tricuspid valve regurgitation is not demonstrated. No evidence of tricuspid stenosis.  Aortic Valve: The aortic valve was not well visualized. Aortic valve regurgitation is not visualized. No aortic stenosis is present. Aortic valve mean gradient measures 5.0 mmHg. Aortic valve peak gradient measures 11.4 mmHg. Aortic valve area, by VTI measures 2.26 cm.  Pulmonic Valve: The pulmonic valve was not well visualized. Pulmonic valve regurgitation is not visualized. No evidence of pulmonic stenosis.  Aorta: The aortic root is normal in size and structure.  Venous: The inferior vena cava is normal in size with greater than 50% respiratory variability, suggesting right atrial pressure of 3 mmHg.  IAS/Shunts: No atrial level shunt detected by color flow Doppler.   LEFT VENTRICLE PLAX 2D LVIDd:         5.40 cm      Diastology LVIDs:         3.20 cm      LV e' medial:    7.29 cm/s LV PW:         0.80 cm      LV E/e' medial:  18.0 LV IVS:        0.80 cm      LV e' lateral:   9.14 cm/s LVOT diam:     1.90 cm      LV E/e' lateral: 14.3 LV SV:         77 LV SV Index:   40           2D Longitudinal Strain LVOT Area:     2.84 cm     2D Strain GLS (A2C):   -21.4 % 2D Strain GLS (A3C):   -34.5 % 2D Strain GLS (A4C):   -31.4 % LV Volumes (MOD)            2D Strain GLS Avg:     -29.1 % LV vol d, MOD A2C: 104.0 ml LV vol d, MOD A4C: 149.0 ml LV vol s, MOD A2C: 39.6 ml LV vol s, MOD A4C: 54.9 ml LV SV MOD A2C:     64.4 ml LV SV MOD A4C:     149.0 ml LV SV MOD BP:      80.5 ml  RIGHT VENTRICLE RV S prime:     11.70 cm/s TAPSE (M-mode): 2.1 cm  LEFT ATRIUM           Index        RIGHT ATRIUM           Index LA diam:      4.60 cm 2.42  cm/m   RA Area:     15.80 cm LA Vol (A4C): 96.9 ml 50.99 ml/m  RA Volume:   37.70 ml  19.84 ml/m AORTIC VALVE AV Area (Vmax):    2.52 cm AV Area (Vmean):   2.73 cm AV Area (VTI):     2.26 cm AV Vmax:           168.50 cm/s AV Vmean:          106.000 cm/s AV VTI:            0.339 m AV Peak Grad:      11.4 mmHg AV Mean Grad:      5.0 mmHg LVOT Vmax:         150.00 cm/s LVOT Vmean:        102.000 cm/s LVOT VTI:  0.270 m LVOT/AV VTI ratio: 0.80  AORTA Ao Root diam: 3.30 cm  MITRAL VALVE                TRICUSPID VALVE MV Area (PHT): 3.34 cm     TR Peak grad:   33.6 mmHg MV Decel Time: 227 msec     TR Vmax:        290.00 cm/s MR Peak grad: 97.9 mmHg MR Mean grad: 113.5 mmHg    SHUNTS MR Vmax:      494.67 cm/s   Systemic VTI:  0.27 m MR Vmean:     508.5 cm/s    Systemic Diam: 1.90 cm MV E velocity: 131.00 cm/s MV A velocity: 62.40 cm/s MV E/A ratio:  2.10  Vishnu Priya Mallipeddi Electronically signed by Winfield Rast Mallipeddi Signature Date/Time: 11/02/2022/3:46:56 PM    Final  TEE  ECHO TEE 01/03/2024  Narrative TRANSESOPHOGEAL ECHO REPORT    Patient Name:   [name] Date of Exam: 01/03/2024 Medical Rec #:  [id number]         Height:       67.0 in Accession #:    [id number]        Weight:       166.0 lb Date of Birth:  June 02, 1946         BSA:          1.868 m Patient Age:    77 years          BP:           151/80 mmHg Patient Gender: F                 HR:           62 bpm. Exam Location:  Inpatient  Procedure: Transesophageal Echo, 3D Echo, Cardiac Doppler and Color Doppler  Indications:     MitraClip  History:         Patient has prior history of Echocardiogram examinations, most recent 10/27/2023. Mitral Valve Disease, Signs/Symptoms:Murmur; Risk Factors:Hypertension.  Sonographer:     Darlys Gales Referring Phys:  [id number] Orbie Pyo Diagnosing Phys: Riley Lam MD  PROCEDURE: After discussion of the risks and benefits  of a TEE, an informed consent was obtained from the patient. The transesophogeal probe was passed without difficulty through the esophogus of the patient. Sedation performed by different physician. The patient developed no complications during the procedure. Gingival bleeding with chipped tooth prior to probe placement. No change in dentition post procecure. Bleeding stopped prior to heparinzation.  IMPRESSIONS   1. Prior to procedure- bi-leaflet prolapse with posterior flail and severe mitral regurgitation. Systolic blunting of pulmonary vein flow and post intubation loading conditions. Sufficiency height and leaflet length. Mean gradient of 1 mmHg with mitral valve area 6.14 cm2. Suitable for mTEER. During procedure, difficult transeptal puncture. Successful inferior, posterior puncture on the thin portion of the septum. A MitraClip XTW was placed medial to the A2/P2 position with 3D support. Mild mitral regurgitation was left with a double orifice area of 6.36 cm2. Mean gradient of 3. A second XTW clip was placed parallel to the first clip at the A2/P2 position with 3D support. Mean gradient of 6 mmHg. Trivial MR. LVOT VTI has doubled. Normalization of pulmonary vein flow in the left and right sided veins. After the procedure left to right ASD flow noted. Trival pericardial effusion is unchanged. Succesfull mTEER placement.. The mitral valve has been repaired/replaced. Severe mitral valve regurgitation. 2. Left  ventricular ejection fraction, by estimation, is 55 to 60%. The left ventricle has normal function. 3. Right ventricular systolic function is normal. The right ventricular size is normal. 4. Left atrial size was moderately dilated. No left atrial/left atrial appendage thrombus was detected. 5. Right atrial size was moderately dilated. 6. The aortic valve is tricuspid. Aortic valve regurgitation is trivial. No aortic stenosis is present. 7. There is Moderate (Grade III) plaque involving the  ascending aorta. 8. Evidence of atrial level shunting detected by color flow Doppler.  FINDINGS Left Ventricle: Left ventricular ejection fraction, by estimation, is 55 to 60%. The left ventricle has normal function. The left ventricular internal cavity size was normal in size.  Right Ventricle: The right ventricular size is normal. No increase in right ventricular wall thickness. Right ventricular systolic function is normal.  Left Atrium: Left atrial size was moderately dilated. No left atrial/left atrial appendage thrombus was detected.  Right Atrium: Right atrial size was moderately dilated.  Pericardium: Trivial pericardial effusion is present. The pericardial effusion is circumferential.  Mitral Valve: Prior to procedure- bi-leaflet prolapse with posterior flail and severe mitral regurgitation. Systolic blunting of pulmonary vein flow and post intubation loading conditions. Sufficiency height and leaflet length. Mean gradient of 1 mmHg with mitral valve area 6.14 cm2. Suitable for mTEER. During procedure, difficult transeptal puncture. Successful inferior, posterior puncture on the thin portion of the septum. A MitraClip XTW was placed medial to the A2/P2 position with 3D support. Mild mitral regurgitation was left with a double orifice area of 6.36 cm2. Mean gradient of 3. A second XTW clip was placed parallel to the first clip at the A2/P2 position with 3D support. Mean gradient of 6 mmHg. Trivial MR. LVOT VTI has doubled. Normalization of pulmonary vein flow in the left and right sided veins. After the procedure left to right ASD flow noted. Trival pericardial effusion is unchanged. Succesfull mTEER placement. The mitral valve has been repaired/replaced. Severe mitral valve regurgitation. MV peak gradient, 6.8 mmHg. The mean mitral valve gradient is 3.5 mmHg.  Tricuspid Valve: The tricuspid valve is normal in structure. Tricuspid valve regurgitation is mild . No evidence of tricuspid  stenosis.  Aortic Valve: The aortic valve is tricuspid. Aortic valve regurgitation is trivial. No aortic stenosis is present.  Pulmonic Valve: The pulmonic valve was not well visualized. Pulmonic valve regurgitation is not visualized.  Aorta: The aortic root, ascending aorta and aortic arch are all structurally normal, with no evidence of dilitation or obstruction. There is moderate (Grade III) plaque involving the ascending aorta.  IAS/Shunts: Evidence of atrial level shunting detected by color flow Doppler.   AORTIC VALVE LVOT Vmax:   117.25 cm/s LVOT Vmean:  85.325 cm/s LVOT VTI:    0.252 m  MITRAL VALVE MV Peak grad: 6.8 mmHg  SHUNTS MV Mean grad: 3.5 mmHg  Systemic VTI: 0.25 m MV Vmax:      1.30 m/s MV Vmean:     85.0 cm/s  Riley Lam MD Electronically signed by Riley Lam MD Signature Date/Time: 01/03/2024/12:55:18 PM    Final  MONITORS  LONG TERM MONITOR (3-14 DAYS) 01/03/2024  Narrative Patch Wear Time:  8 days and 6 hours (2025-01-08T12:24:29-0500 to 2025-01-16T18:48:04-499)  Patient had a min HR of 45 bpm, max HR of 167 bpm, and avg HR of 62 bpm. Predominant underlying rhythm was Sinus Rhythm.  EVENTS: -71 Supraventricular Tachycardia runs occurred, the run with the fastest interval lasting 7 beats with a max rate of 167 bpm, the  longest lasting 13 beats with an avg rate of 116 bpm. Some episodes of Supraventricular Tachycardia may be possible Atrial Tachycardia with variable block.  -Isolated SVEs were rare (<1.0%), SVE Couplets were rare (<1.0%), and SVE Triplets were rare (<1.0%). Isolated VEs were rare (<1.0%), and no VE Couplets or VE Triplets were present. Difficulty discerning atrial activity during periods of high heart rate, making definitive diagnosis between Sinus Tachycardia and Atrial Tachycardia difficult to ascertain.  No atrial fibrillation, sustained ventricular tachyarrhythmias, or bradyarrhythmias were detected.  No patient  triggered events.          Patient Profile     [name] is a 78 y.o. female with past medical history of mitral valve regurgitation, HTN and history of thyroid cancer who presented 01/03/24 for mTEER. .   Assessment & Plan    Severe mitral regurgitation: with posterior leaflet prolapse, ruptured chord, and flail P2 segment on pre-op TEE, she is now s/p successful MitraClip XTW x2 at A2/P2 reducing MR from 4+ to trace on post procedure echo on 01/03/24. Started on DAPT with ASA 81mg  and Plavix 75mg . Echo pending. Unfortunately patient had an episode of transient monocular blindness overnight. See plan below.   Transient monocular blindness: Patient reported acute left eye vision loss early this AM. CT performed with no acute intracranial abnormalities. No code stroke called given brief duration. Neurology team contacted this morning with plans for MRI brain and CTA head/neck and formal consultation. Will follow results and neuro recommendations.    SVT/Sinus tachycardia/atrial tachycardia: Recent complaints of tachy-palpitations with subsequent ZIO confirming predominant SR with 71 runs of SVT with intermittent possible atrial tachycardia episodes. Previously RXd PRN metoprolol however started on Toprol XL 12.5mg  this admission. Telemetry with NSR/SB with rates in the upper 50's-60's and no SVT/AT.    HTN: Stable with the addition of Toprol 12.5mg  daily. Follow trend.   ATTENDING ATTESTATION:  After conducting a review of all available clinical information with the care team, interviewing the patient, and performing a physical exam, I agree with the findings and plan described in this note.   GEN: No acute distress.   HEENT:  MMM, no JVD, no scleral icterus Cardiac: RRR, no murmurs, rubs, or gallops.  Respiratory: Clear to auscultation bilaterally. GI: Soft, nontender, non-distended  MS: No edema; No deformity. Neuro:  Nonfocal  Vasc:  +2 radial pulses  The patient had mitral  transcatheter edge-to-edge repair with 2 XT W clips yesterday reducing severe mitral regurgitation to trace to mild.  Unfortunately she developed transient mono ocular blindness overnight.  A CT scan demonstrated no intracranial hemorrhage.  An MRI has demonstrated embolic phenomena bilaterally.  Formal neurology consultation is pending.  Thankfully she retains executive and motor function.  Alverda Skeans, MD Pager [phone]    Signed, Georgie Chard, NP  01/04/2024, 9:51 AM  Pager [phone]

## 2024-01-04 NOTE — Progress Notes (Signed)
Verified [facility] orders for q4 hours with Dr Derry Lory via secure chat. Dr Derry Lory, stated that q4 hours would be should be fine.

## 2024-01-04 NOTE — Significant Event (Signed)
3:26 AM I was notified by the RN that the patient reported acute vision loss in her L eye without complete resolution of her symptoms. I immediately presented to bedside. The patient states that at ~2am she sat up in bed and had acute monocular vision loss in her L eye. She estimates that her vision was "completely black" for ~20 seconds before he vision slowly started to return. Her vision became blurry and then subsequently completely returned to baseline. She endorses a mild headache that has been present for hours and some neck discomfort that she had earlier in the day that has since resolved. Denies weakness or numbness. Her vision is currently back to normal. I completed a neurologic assessment - CN II-12 intact, no visual field deficits, motor strength 5/5 in bilateral upper and lower extremities, no loss of sensation to LT in any extremity. Given her presentation I am concerned for amaurosis fugax vs TIA. I will get a STAT head CT to start. Since her symptoms have resolved I will not stroke code her at this time. If her head CT is concerning I will speak with neurology overnight. If not, then can be engaged in the day time. The patient is amenable to this plan.

## 2024-01-04 NOTE — Progress Notes (Signed)
I was asked to assess [name] for vision changes after she stated her vision in her left eye went "black". Upon my arrival, Ms. Ormsby was awake and answering questions appropriately. She stated she could see out of her left eye now. She has vision in all fields and could identify objects with her glasses on. No focal deficits.

## 2024-01-05 ENCOUNTER — Other Ambulatory Visit: Payer: Self-pay | Admitting: Cardiology

## 2024-01-05 DIAGNOSIS — Z9889 Other specified postprocedural states: Secondary | ICD-10-CM | POA: Diagnosis not present

## 2024-01-05 DIAGNOSIS — I639 Cerebral infarction, unspecified: Secondary | ICD-10-CM | POA: Insufficient documentation

## 2024-01-05 DIAGNOSIS — K118 Other diseases of salivary glands: Secondary | ICD-10-CM | POA: Insufficient documentation

## 2024-01-05 DIAGNOSIS — Z95818 Presence of other cardiac implants and grafts: Secondary | ICD-10-CM | POA: Diagnosis not present

## 2024-01-05 MED ORDER — ROSUVASTATIN CALCIUM 20 MG PO TABS
20.0000 mg | ORAL_TABLET | Freq: Every day | ORAL | Status: DC
  Filled 2024-01-05: qty 1

## 2024-01-05 MED ORDER — PANTOPRAZOLE SODIUM 40 MG PO TBEC: 40.0000 mg | DELAYED_RELEASE_TABLET | Freq: Every day | ORAL | 1 refills | Status: DC

## 2024-01-05 MED ORDER — ASPIRIN 81 MG PO CHEW: 81.0000 mg | CHEWABLE_TABLET | Freq: Every day | ORAL | Status: DC

## 2024-01-05 MED ORDER — ROSUVASTATIN CALCIUM 20 MG PO TABS: 20.0000 mg | ORAL_TABLET | Freq: Every day | ORAL | 2 refills | Status: DC

## 2024-01-05 MED ORDER — CLOPIDOGREL BISULFATE 75 MG PO TABS: 75.0000 mg | ORAL_TABLET | Freq: Every day | ORAL | 2 refills | Status: DC

## 2024-01-05 MED ORDER — METOPROLOL SUCCINATE ER 25 MG PO TB24: 12.5000 mg | ORAL_TABLET | Freq: Every day | ORAL | 2 refills | Status: DC

## 2024-01-05 MED FILL — Lidocaine Inj 1% w/ Epinephrine-1:100000: INTRAMUSCULAR | Qty: 20 | Status: AC

## 2024-01-05 MED FILL — Fentanyl Citrate Preservative Free (PF) Inj 100 MCG/2ML: INTRAMUSCULAR | Qty: 2 | Status: AC

## 2024-01-05 NOTE — Progress Notes (Addendum)
[facility]   MULTIDISCIPLINARY HEART VALVE TEAM  Patient Name: [name] Date of Encounter: 01/05/2024  Primary Cardiologist: Dr. Dietrich Pates, MD/ Dr. Alverda Skeans, MD (TEER) (patient wishes to follow with Dr. Lynnette Caffey)  [facility] Problem List     Principal Problem:   S/P mitral valve clip implantation Active Problems:   Essential hypertension, benign   Hyperlipidemia   Severe mitral regurgitation   Transient monocular blindness, left   CVA (cerebral vascular accident) [facility])   Parotid mass   Subjective   Doing well today with no complaints. Denies recurrent visual changes. No chest pain, SOB, palpitations.   Inpatient Medications    Scheduled Meds:  aspirin  81 mg Oral Daily   clopidogrel  75 mg Oral Q breakfast   enalapril  10 mg Oral Daily   ferrous sulfate  325 mg Oral Daily   heparin injection (subcutaneous)  5,000 Units Subcutaneous Q8H   levothyroxine  150 mcg Oral Q0600   metoprolol succinate  12.5 mg Oral Daily   sodium chloride flush  3 mL Intravenous Q12H   Continuous Infusions:  PRN Meds: acetaminophen, hydrALAZINE, labetalol, ondansetron (ZOFRAN) IV, mouth rinse, oxyCODONE-acetaminophen, sodium chloride flush   Vital Signs    Vitals:   01/05/24 0050 01/05/24 0451 01/05/24 0750 01/05/24 0902  BP: (!) 144/80 (!) 140/80 121/67   Pulse: 65 60 64 66  Resp: 16 17 16    Temp: 98.2 F (36.8 C) 98.1 F (36.7 C) 98.2 F (36.8 C)   TempSrc: Oral Oral Oral   SpO2: 96% 96% 95%   Weight:      Height:        Intake/Output Summary (Last 24 hours) at 01/05/2024 1008 Last data filed at 01/05/2024 0800 Gross per 24 hour  Intake 120 ml  Output 725 ml  Net -605 ml   Filed Weights   01/02/24 1040 01/03/24 0635 01/03/24 1734  Weight: 75.3 kg 75.3 kg 78.1 kg   Physical Exam    General: Well developed, well nourished, NAD Lungs:Clear to ausculation bilaterally. No wheezes, rales, or rhonchi. Breathing is unlabored. Cardiovascular:  RRR with S1 S2. No murmurs Abdomen: Soft, non-tender, non-distended. No obvious abdominal masses. Extremities: No edema. Groin stable.  Neuro: Alert and oriented. No focal deficits. No facial asymmetry. MAE spontaneously. Psych: Responds to questions appropriately with normal affect.    Labs    CBC Recent Labs    01/04/24 0217  WBC 6.5  HGB 10.9*  HCT 32.8*  MCV 91.6  PLT 156   Basic Metabolic Panel Recent Labs    16/10/96 0217  CREATININE 1.05*   Liver Function Tests No results for input(s): "AST", "ALT", "ALKPHOS", "BILITOT", "PROT", "ALBUMIN" in the last 72 hours. No results for input(s): "LIPASE", "AMYLASE" in the last 72 hours. Cardiac Enzymes No results for input(s): "CKTOTAL", "CKMB", "CKMBINDEX", "TROPONINI" in the last 72 hours. BNP Invalid input(s): "POCBNP" D-Dimer No results for input(s): "DDIMER" in the last 72 hours. Hemoglobin A1C Recent Labs    01/04/24 0217  HGBA1C 4.7*   Fasting Lipid Panel Recent Labs    01/04/24 1558  CHOL 160  HDL 52  LDLCALC 92  TRIG 80  CHOLHDL 3.1   Thyroid Function Tests No results for input(s): "TSH", "T4TOTAL", "T3FREE", "THYROIDAB" in the last 72 hours.  Invalid input(s): "FREET3"  Telemetry    01/05/24 SR/SB with rates in the 60's - Personally Reviewed  ECG    No new tracing as of 01/05/24 - Personally  Reviewed  Radiology    CT ANGIO HEAD NECK W WO CM Result Date: 01/05/2024 CLINICAL DATA:  Initial evaluation for monocular visual loss. EXAM: CT ANGIOGRAPHY HEAD AND NECK WITH AND WITHOUT CONTRAST TECHNIQUE: Multidetector CT imaging of the head and neck was performed using the standard protocol during bolus administration of intravenous contrast. Multiplanar CT image reconstructions and MIPs were obtained to evaluate the vascular anatomy. Carotid stenosis measurements (when applicable) are obtained utilizing NASCET criteria, using the distal internal carotid diameter as the denominator. RADIATION DOSE  REDUCTION: This exam was performed according to the departmental dose-optimization program which includes automated exposure control, adjustment of the mA and/or kV according to patient size and/or use of iterative reconstruction technique. CONTRAST:  75mL OMNIPAQUE IOHEXOL 350 MG/ML SOLN COMPARISON:  Prior CT and MRI from earlier the same day. FINDINGS: CTA NECK FINDINGS Aortic arch: Visualized aortic arch within normal limits for caliber with standard 3 vessel morphology. Aortic atherosclerosis. No high-grade stenosis or other acute abnormality about the origin the great vessels. Right carotid system: Right common and internal carotid arteries are tortuous but patent without dissection. Mild atheromatous change about the right carotid bulb without hemodynamically significant stenosis. Left carotid system: Left common and internal carotid arteries are tortuous with patent without dissection. Mild atheromatous change about the left carotid bulb without hemodynamically significant stenosis. Vertebral arteries: Both vertebral arteries arise from the subclavian arteries. No proximal subclavian artery stenosis. Left vertebral artery slightly dominant. Vertebral arteries are patent without stenosis or dissection. Skeleton: No discrete or worrisome osseous lesions. Other neck: No other acute finding. 1.2 cm soft tissue mass present within the superficial right parotid gland, suspicious for a primary salivary neoplasm (series 5, image 78). Prior thyroidectomy. Upper chest: No other acute finding. Review of the MIP images confirms the above findings CTA HEAD FINDINGS Anterior circulation: Mild atheromatous change about the carotid siphons without hemodynamically significant stenosis. A1 segments patent bilaterally. Right A1 hypoplastic. Normal anterior communicating artery complex. Anterior cerebral arteries patent without stenosis. No M1 stenosis or occlusion. Distal MCA branches perfused and symmetric. Posterior  circulation: Both V4 segments patent without stenosis. Right PICA patent. Left PICA origin not well seen. Basilar patent without stenosis. Superior cerebral arteries patent bilaterally. Left PCA supplied via the basilar. Fetal type origin of the right PCA. Both PCAs are patent without stenosis. Venous sinuses: Patent allowing for timing the contrast bolus. Anatomic variants: As above.  No aneurysm. Review of the MIP images confirms the above findings IMPRESSION: 1. Negative CTA for large vessel occlusion or other emergent finding. 2. Mild atheromatous change about the carotid bifurcations and carotid siphons without hemodynamically significant or correctable stenosis. 3. 1.2 cm soft tissue mass within the superficial right parotid gland, suspicious for a primary salivary neoplasm. Nonemergent outpatient ENT referral suggested for further workup and evaluation. 4.  Aortic Atherosclerosis (ICD10-I70.0). Electronically Signed   By: Rise Mu M.D.   On: 01/05/2024 04:19   ECHOCARDIOGRAM COMPLETE Result Date: 01/04/2024    ECHOCARDIOGRAM REPORT   Patient Name:   NATEISHA MOYD Date of Exam: 01/04/2024 Medical Rec #:  [id number]         Height:       67.0 in Accession #:    [id number]        Weight:       172.2 lb Date of Birth:  August 24, 1946         BSA:          1.898 m Patient Age:  77 years          BP:           134/74 mmHg Patient Gender: F                 HR:           53 bpm. Exam Location:  Inpatient Procedure: 2D Echo, 3D Echo, Cardiac Doppler, Color Doppler and Strain Analysis Indications:    S/P Mitral Valve Clip implantation  History:        Patient has prior history of Echocardiogram examinations, most                 recent 01/03/2024.                  Mitral Valve: Mitra-Clip valve is present in the mitral                 position.  Sonographer:    Karma Ganja Referring Phys: [phone] JILL D MCDANIEL  Sonographer Comments: Global longitudinal strain was attempted. IMPRESSIONS  1. Left  ventricular ejection fraction, by estimation, is 60 to 65%. Left ventricular ejection fraction by 3D volume is 63 %. The left ventricle has normal function. The left ventricle has no regional wall motion abnormalities. There is mild concentric left ventricular hypertrophy. Left ventricular diastolic parameters are indeterminate. The average left ventricular global longitudinal strain is -19.4 %. The global longitudinal strain is normal.  2. Right ventricular systolic function is normal. The right ventricular size is normal. There is normal pulmonary artery systolic pressure.  3. Left atrial size was severely dilated.  4. S/p 2 MitraClip XTW (A2-P2 medial near A3/P3 and lateral A2/P2). Mean gradient 3 mm Hg. There is no significant mitral regurgitation. Left to right shunting not seen in this study. . The mitral valve has been repaired/replaced. Trivial mitral valve regurgitation. No evidence of mitral stenosis. The mean mitral valve gradient is 3.0 mmHg with average heart rate of 56 bpm. There is a Mitra-Clip present in the mitral position.  5. The aortic valve is tricuspid. Aortic valve regurgitation is trivial.  6. The inferior vena cava is normal in size with greater than 50% respiratory variability, suggesting right atrial pressure of 3 mmHg. FINDINGS  Left Ventricle: Left ventricular ejection fraction, by estimation, is 60 to 65%. Left ventricular ejection fraction by 3D volume is 63 %. The left ventricle has normal function. The left ventricle has no regional wall motion abnormalities. The average left ventricular global longitudinal strain is -19.4 %. The global longitudinal strain is normal. The left ventricular internal cavity size was normal in size. There is mild concentric left ventricular hypertrophy. Left ventricular diastolic parameters are indeterminate. Right Ventricle: The right ventricular size is normal. No increase in right ventricular wall thickness. Right ventricular systolic function is  normal. There is normal pulmonary artery systolic pressure. The tricuspid regurgitant velocity is 2.57 m/s, and  with an assumed right atrial pressure of 3 mmHg, the estimated right ventricular systolic pressure is 29.4 mmHg. Left Atrium: Left atrial size was severely dilated. Right Atrium: Right atrial size was normal in size. Pericardium: There is no evidence of pericardial effusion. Mitral Valve: S/p 2 MitraClip XTW (A2-P2 medial near A3/P3 and lateral A2/P2). Mean gradient 3 mm Hg. There is no significant mitral regurgitation. Left to right shunting not seen in this study. The mitral valve has been repaired/replaced. Trivial mitral  valve regurgitation. There is a Mitra-Clip present in the mitral position. No evidence of  mitral valve stenosis. MV peak gradient, 8.1 mmHg. The mean mitral valve gradient is 3.0 mmHg with average heart rate of 56 bpm. Tricuspid Valve: The tricuspid valve is normal in structure. Tricuspid valve regurgitation is trivial. No evidence of tricuspid stenosis. Aortic Valve: The aortic valve is tricuspid. Aortic valve regurgitation is trivial. Aortic regurgitation PHT measures 769 msec. Aortic valve mean gradient measures 7.0 mmHg. Aortic valve peak gradient measures 12.7 mmHg. Aortic valve area, by VTI measures 2.64 cm. Pulmonic Valve: The pulmonic valve was not well visualized. Pulmonic valve regurgitation is not visualized. No evidence of pulmonic stenosis. Aorta: The aortic root is normal in size and structure. Venous: The inferior vena cava is normal in size with greater than 50% respiratory variability, suggesting right atrial pressure of 3 mmHg. IAS/Shunts: The interatrial septum was not well visualized.  LEFT VENTRICLE PLAX 2D LVIDd:         4.60 cm         Diastology LVIDs:         3.30 cm         LV e' medial:    4.46 cm/s LV PW:         1.20 cm         LV E/e' medial:  28.0 LV IVS:        1.30 cm         LV e' lateral:   5.11 cm/s LVOT diam:     2.00 cm         LV E/e' lateral:  24.5 LV SV:         113 LV SV Index:   59              2D LVOT Area:     3.14 cm        Longitudinal                                Strain                                2D Strain GLS  -18.7 %                                (A2C):                                2D Strain GLS  -18.5 %                                (A3C):                                2D Strain GLS  -20.9 %                                (A4C):                                2D Strain GLS  -19.4 %  Avg:                                 3D Volume EF                                LV 3D EF:    Left                                             ventricul                                             ar                                             ejection                                             fraction                                             by 3D                                             volume is                                             63 %.                                 3D Volume EF:                                3D EF:        63 %                                LV EDV:       125 ml                                LV ESV:       46 ml                                LV SV:        80 ml RIGHT VENTRICLE  IVC RV Basal diam:  3.40 cm     IVC diam: 1.40 cm RV S prime:     12.50 cm/s TAPSE (M-mode): 2.6 cm LEFT ATRIUM              Index        RIGHT ATRIUM           Index LA diam:        4.80 cm  2.53 cm/m   RA Area:     21.10 cm LA Vol (A2C):   114.0 ml 60.08 ml/m  RA Volume:   58.90 ml  31.04 ml/m LA Vol (A4C):   133.0 ml 70.09 ml/m LA Biplane Vol: 124.0 ml 65.35 ml/m  AORTIC VALVE AV Area (Vmax):    2.62 cm AV Area (Vmean):   2.50 cm AV Area (VTI):     2.64 cm AV Vmax:           178.50 cm/s AV Vmean:          119.000 cm/s AV VTI:            0.428 m AV Peak Grad:      12.7 mmHg AV Mean Grad:      7.0 mmHg LVOT Vmax:         149.00 cm/s LVOT Vmean:        94.600 cm/s LVOT VTI:          0.359 m  LVOT/AV VTI ratio: 0.84 AI PHT:            769 msec  AORTA Ao Root diam: 3.30 cm MITRAL VALVE                TRICUSPID VALVE MV Area (PHT): 1.42 cm     TR Peak grad:   26.4 mmHg MV Area VTI:   2.00 cm     TR Vmax:        257.00 cm/s MV Peak grad:  8.1 mmHg MV Mean grad:  3.0 mmHg     SHUNTS MV Vmax:       1.43 m/s     Systemic VTI:  0.36 m MV Vmean:      84.5 cm/s    Systemic Diam: 2.00 cm MV E velocity: 125.00 cm/s MV A velocity: 129.00 cm/s MV E/A ratio:  0.97 Riley Lam MD Electronically signed by Riley Lam MD Signature Date/Time: 01/04/2024/11:55:45 AM    Final    MR BRAIN WO CONTRAST Result Date: 01/04/2024 CLINICAL DATA:  Vision impairment (Ped 0-17y) EXAM: MRI HEAD WITHOUT CONTRAST TECHNIQUE: Multiplanar, multiecho pulse sequences of the brain and surrounding structures were obtained without intravenous contrast. COMPARISON:  Head CT 01/04/2024 FINDINGS: Brain: There are scattered punctate acute infarcts and multiple vascular territories, including the left cerebellum (series 2, image 14), right occipital lobe (series 2, image 24), left parietal lobe (series 2, image 32), and right frontal lobe (series 2, image 43). No mass effect. No mass lesion. No hemorrhage. Hydrocephalus. There is a background of mild chronic microvascular ischemic change. There is chronic infarct in the right basal ganglia. Vascular: Normal flow voids. Skull and upper cervical spine: Normal marrow signal. Sinuses/Orbits: No middle ear or mastoid effusion paranasal sinuses are clear. Bilateral lens replacement. Orbits are otherwise unremarkable Other: None. IMPRESSION: Scattered punctate acute infarcts in multiple vascular territories, including the left cerebellum, right occipital lobe, left parietal lobe, and right frontal lobe. Findings are favored to be secondary to a central embolic etiology. No hemorrhage. These results will  be called to the ordering clinician or representative by the Radiologist Assistant,  and communication documented in the PACS or [employer]. Electronically Signed   By: Lorenza Cambridge M.D.   On: 01/04/2024 10:52   CT HEAD WO CONTRAST ( ) Result Date: 01/04/2024 CLINICAL DATA:  78 year old female with monocular vision loss. Postoperative day 1 mitral valve repair. EXAM: CT HEAD WITHOUT CONTRAST TECHNIQUE: Contiguous axial images were obtained from the base of the skull through the vertex without intravenous contrast. RADIATION DOSE REDUCTION: This exam was performed according to the departmental dose-optimization program which includes automated exposure control, adjustment of the mA and/or kV according to patient size and/or use of iterative reconstruction technique. COMPARISON:  None Available. FINDINGS: Brain: Cerebral volume is within normal limits for age. No midline shift, ventriculomegaly, mass effect, evidence of mass lesion, intracranial hemorrhage or evidence of cortically based acute infarction. Preserved bilateral gray-white differentiation with mild for age mostly subcortical white matter hypodensity. Normal noncontrast CT appearance of the suprasellar cistern, cavernous sinus. Vascular: Calcified atherosclerosis at the skull base. No suspicious intracranial vascular hyperdensity. Skull: Intact.  No acute osseous abnormality identified. Sinuses/Orbits: Visualized paranasal sinuses and mastoids are clear. Other: Orbits soft tissues appears symmetric, unremarkable. Visualized scalp soft tissues are within normal limits. IMPRESSION: 1. No acute intracranial abnormality. Mild for age white matter changes most commonly due to small vessel disease. 2. Unremarkable noncontrast CT appearance of the orbits. Electronically Signed   By: Odessa Fleming M.D.   On: 01/04/2024 06:12   LONG TERM MONITOR (3-14 DAYS) Result Date: 01/03/2024 Patch Wear Time:  8 days and 6 hours (2025-01-08T12:24:29-0500 to 2025-01-16T18:48:04-499) Patient had a min HR of 45 bpm, max HR of 167 bpm, and avg HR of 62  bpm. Predominant underlying rhythm was Sinus Rhythm. EVENTS: -71 Supraventricular Tachycardia runs occurred, the run with the fastest interval lasting 7 beats with a max rate of 167 bpm, the longest lasting 13 beats with an avg rate of 116 bpm. Some episodes of Supraventricular Tachycardia may be possible Atrial Tachycardia with variable block. -Isolated SVEs were rare (<1.0%), SVE Couplets were rare (<1.0%), and SVE Triplets were rare (<1.0%). Isolated VEs were rare (<1.0%), and no VE Couplets or VE Triplets were present. Difficulty discerning atrial activity during periods of high heart rate, making definitive diagnosis between Sinus Tachycardia and Atrial Tachycardia difficult to ascertain. No atrial fibrillation, sustained ventricular tachyarrhythmias, or bradyarrhythmias were detected. No patient triggered events.   ECHO TEE Result Date: 01/03/2024    TRANSESOPHOGEAL ECHO REPORT   Patient Name:   JAYD FORREY Date of Exam: 01/03/2024 Medical Rec #:  [id number]         Height:       67.0 in Accession #:    [id number]        Weight:       166.0 lb Date of Birth:  [birth year]         BSA:          1.868 m Patient Age:    77 years          BP:           151/80 mmHg Patient Gender: F                 HR:           62 bpm. Exam Location:  Inpatient Procedure: Transesophageal Echo, 3D Echo, Cardiac Doppler and Color Doppler Indications:     MitraClip  History:  Patient has prior history of Echocardiogram examinations, most                  recent 10/27/2023. Mitral Valve Disease, Signs/Symptoms:Murmur;                  Risk Factors:Hypertension.  Sonographer:     Darlys Gales Referring Phys:  [id number] Orbie Pyo Diagnosing Phys: Riley Lam MD PROCEDURE: After discussion of the risks and benefits of a TEE, an informed consent was obtained from the patient. The transesophogeal probe was passed without difficulty through the esophogus of the patient. Sedation performed by different physician. The  patient developed no complications during the procedure. Gingival bleeding with chipped tooth prior to probe placement. No change in dentition post procecure. Bleeding stopped prior to heparinzation.  IMPRESSIONS  1. Prior to procedure- bi-leaflet prolapse with posterior flail and severe mitral regurgitation. Systolic blunting of pulmonary vein flow and post intubation loading conditions. Sufficiency height and leaflet length. Mean gradient of 1 mmHg with mitral valve area 6.14 cm2. Suitable for mTEER.     During procedure, difficult transeptal puncture. Successful inferior, posterior puncture on the thin portion of the septum.     A MitraClip XTW was placed medial to the A2/P2 position with 3D support. Mild mitral regurgitation was left with a double orifice area of 6.36 cm2. Mean gradient of 3. A second XTW clip was placed parallel to the first clip at the A2/P2 position with  3D support. Mean gradient of 6 mmHg. Trivial MR. LVOT VTI has doubled. Normalization of pulmonary vein flow in the left and right sided veins.     After the procedure left to right ASD flow noted. Trival pericardial effusion is unchanged. Succesfull mTEER placement.. The mitral valve has been repaired/replaced. Severe mitral valve regurgitation.  2. Left ventricular ejection fraction, by estimation, is 55 to 60%. The left ventricle has normal function.  3. Right ventricular systolic function is normal. The right ventricular size is normal.  4. Left atrial size was moderately dilated. No left atrial/left atrial appendage thrombus was detected.  5. Right atrial size was moderately dilated.  6. The aortic valve is tricuspid. Aortic valve regurgitation is trivial. No aortic stenosis is present.  7. 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  8. Evidence of atrial level shunting detected by color flow Doppler. FINDINGS  Left Ventricle: Left ventricular ejection fraction, by estimation, is 55 to 60%. The left ventricle has  normal function. The left ventricular internal cavity size was normal in size. Right Ventricle: The right ventricular size is normal. No increase in right ventricular wall thickness. Right ventricular systolic function is normal. Left Atrium: Left atrial size was moderately dilated. No left atrial/left atrial appendage thrombus was detected. Right Atrium: Right atrial size was moderately dilated. Pericardium: Trivial pericardial effusion is present. The pericardial effusion is circumferential. Mitral Valve: Prior to procedure- bi-leaflet prolapse with posterior flail and severe mitral regurgitation. Systolic blunting of pulmonary vein flow and post intubation loading conditions. Sufficiency height and leaflet length. Mean gradient of 1 mmHg with mitral valve area 6.14 cm2. Suitable for mTEER. During procedure, difficult transeptal puncture. Successful inferior, posterior puncture on the thin portion of the septum. A MitraClip XTW was placed medial to the A2/P2 position with 3D support. Mild mitral regurgitation was left with a double orifice area of 6.36 cm2. Mean gradient of 3. A second XTW clip was placed parallel to the first clip at the A2/P2 position with 3D  support. Mean gradient of 6 mmHg. Trivial MR. LVOT VTI has doubled. Normalization of pulmonary vein flow in the left and right sided veins. After the procedure left to right ASD flow noted. Trival pericardial effusion is unchanged. Succesfull mTEER placement. The mitral valve has been repaired/replaced. Severe mitral valve regurgitation. MV peak gradient, 6.8 mmHg. The mean mitral valve gradient is 3.5 mmHg. Tricuspid Valve: The tricuspid valve is normal in structure. Tricuspid valve regurgitation is mild . No evidence of tricuspid stenosis. Aortic Valve: The aortic valve is tricuspid. Aortic valve regurgitation is trivial. No aortic stenosis is present. Pulmonic Valve: The pulmonic valve was not well visualized. Pulmonic valve regurgitation is not  visualized. Aorta: The aortic root, ascending aorta and aortic arch are all structurally normal, with no evidence of dilitation or obstruction. There is moderate (Grade III) plaque involving the ascending aorta. IAS/Shunts: Evidence of atrial level shunting detected by color flow Doppler.  AORTIC VALVE LVOT Vmax:   117.25 cm/s LVOT Vmean:  85.325 cm/s LVOT VTI:    0.252 m MITRAL VALVE MV Peak grad: 6.8 mmHg  SHUNTS MV Mean grad: 3.5 mmHg  Systemic VTI: 0.25 m MV Vmax:      1.30 m/s MV Vmean:     85.0 cm/s Riley Lam MD Electronically signed by Riley Lam MD Signature Date/Time: 01/03/2024/12:55:18 PM    Final    Cardiac Studies   Cardiac Studies & Procedures   CARDIAC CATHETERIZATION  CARDIAC CATHETERIZATION 11/16/2023  Narrative 1.  Minimal nonobstructive coronary artery disease. 2.  Fick cardiac output of 7.2 L/min and Fick cardiac index of 3.8 L/min/m with the following hemodynamics: Right atrial pressure mean of 6 mmHg Right ventricular pressure 33/1 with an end-diastolic pressure of 11 mmHg Wedge pressure mean of 13 mmHg with V waves to 18 mmHg PA pressure 34/9 with a mean of 21 mmHg PVR of 1.1 Woods units PA pulsatility index of over 4 3.  LVEDP of 12 mmHg.  Recommendation: Continue evaluation for mitral valve intervention.  Findings Coronary Findings Diagnostic  Dominance: Right  Left Anterior Descending The vessel exhibits minimal luminal irregularities.  Left Circumflex The vessel exhibits minimal luminal irregularities.  Intervention  No interventions have been documented.   STRESS TESTS  NM MYOCAR MULTI W/SPECT W 12/29/2016  Narrative  There was no ST segment deviation noted during stress.  The study is normal. Inferior defect most consistent with subdiaphragmatic attenuation and gut radiotracer uptake artifact as opposed to true inferior wall ischemia.  This is a low risk study.  The left ventricular ejection fraction is normal (55-65%).   ECHOCARDIOGRAM  ECHOCARDIOGRAM COMPLETE 01/04/2024  Narrative ECHOCARDIOGRAM REPORT    Patient Name:   VERNICE MANNINA Date of Exam: 01/04/2024 Medical Rec #:  [id number]         Height:       67.0 in Accession #:    [id number]        Weight:       172.2 lb Date of Birth:  January 24, 1946         BSA:          1.898 m Patient Age:    77 years          BP:           134/74 mmHg Patient Gender: F                 HR:           53 bpm. Exam Location:  Inpatient  Procedure: 2D Echo, 3D Echo, Cardiac Doppler, Color Doppler and Strain Analysis  Indications:    S/P Mitral Valve Clip implantation  History:        Patient has prior history of Echocardiogram examinations, most recent 01/03/2024.  Mitral Valve: Mitra-Clip valve is present in the mitral position.  Sonographer:    Karma Ganja Referring Phys: [phone] JILL D MCDANIEL   Sonographer Comments: Global longitudinal strain was attempted. IMPRESSIONS   1. Left ventricular ejection fraction, by estimation, is 60 to 65%. Left ventricular ejection fraction by 3D volume is 63 %. The left ventricle has normal function. The left ventricle has no regional wall motion abnormalities. There is mild concentric left ventricular hypertrophy. Left ventricular diastolic parameters are indeterminate. The average left ventricular global longitudinal strain is -19.4 %. The global longitudinal strain is normal. 2. Right ventricular systolic function is normal. The right ventricular size is normal. There is normal pulmonary artery systolic pressure. 3. Left atrial size was severely dilated. 4. S/p 2 MitraClip XTW (A2-P2 medial near A3/P3 and lateral A2/P2). Mean gradient 3 mm Hg. There is no significant mitral regurgitation. Left to right shunting not seen in this study. . The mitral valve has been repaired/replaced. Trivial mitral valve regurgitation. No evidence of mitral stenosis. The mean mitral valve gradient is 3.0 mmHg with average heart rate of 56 bpm.  There is a Mitra-Clip present in the mitral position. 5. The aortic valve is tricuspid. Aortic valve regurgitation is trivial. 6. The inferior vena cava is normal in size with greater than 50% respiratory variability, suggesting right atrial pressure of 3 mmHg.  FINDINGS Left Ventricle: Left ventricular ejection fraction, by estimation, is 60 to 65%. Left ventricular ejection fraction by 3D volume is 63 %. The left ventricle has normal function. The left ventricle has no regional wall motion abnormalities. The average left ventricular global longitudinal strain is -19.4 %. The global longitudinal strain is normal. The left ventricular internal cavity size was normal in size. There is mild concentric left ventricular hypertrophy. Left ventricular diastolic parameters are indeterminate.  Right Ventricle: The right ventricular size is normal. No increase in right ventricular wall thickness. Right ventricular systolic function is normal. There is normal pulmonary artery systolic pressure. The tricuspid regurgitant velocity is 2.57 m/s, and with an assumed right atrial pressure of 3 mmHg, the estimated right ventricular systolic pressure is 29.4 mmHg.  Left Atrium: Left atrial size was severely dilated.  Right Atrium: Right atrial size was normal in size.  Pericardium: There is no evidence of pericardial effusion.  Mitral Valve: S/p 2 MitraClip XTW (A2-P2 medial near A3/P3 and lateral A2/P2). Mean gradient 3 mm Hg. There is no significant mitral regurgitation. Left to right shunting not seen in this study. The mitral valve has been repaired/replaced. Trivial mitral valve regurgitation. There is a Mitra-Clip present in the mitral position. No evidence of mitral valve stenosis. MV peak gradient, 8.1 mmHg. The mean mitral valve gradient is 3.0 mmHg with average heart rate of 56 bpm.  Tricuspid Valve: The tricuspid valve is normal in structure. Tricuspid valve regurgitation is trivial. No evidence of  tricuspid stenosis.  Aortic Valve: The aortic valve is tricuspid. Aortic valve regurgitation is trivial. Aortic regurgitation PHT measures 769 msec. Aortic valve mean gradient measures 7.0 mmHg. Aortic valve peak gradient measures 12.7 mmHg. Aortic valve area, by VTI measures 2.64 cm.  Pulmonic Valve: The pulmonic valve was not well visualized. Pulmonic valve regurgitation is not visualized. No evidence of pulmonic stenosis.  Aorta:  The aortic root is normal in size and structure.  Venous: The inferior vena cava is normal in size with greater than 50% respiratory variability, suggesting right atrial pressure of 3 mmHg.  IAS/Shunts: The interatrial septum was not well visualized.   LEFT VENTRICLE PLAX 2D LVIDd:         4.60 cm         Diastology LVIDs:         3.30 cm         LV e' medial:    4.46 cm/s LV PW:         1.20 cm         LV E/e' medial:  28.0 LV IVS:        1.30 cm         LV e' lateral:   5.11 cm/s LVOT diam:     2.00 cm         LV E/e' lateral: 24.5 LV SV:         113 LV SV Index:   59              2D LVOT Area:     3.14 cm        Longitudinal Strain 2D Strain GLS  -18.7 % (A2C): 2D Strain GLS  -18.5 % (A3C): 2D Strain GLS  -20.9 % (A4C): 2D Strain GLS  -19.4 % Avg:  3D Volume EF LV 3D EF:    Left ventricul ar ejection fraction by 3D volume is 63 %.  3D Volume EF: 3D EF:        63 % LV EDV:       125 ml LV ESV:       46 ml LV SV:        80 ml  RIGHT VENTRICLE             IVC RV Basal diam:  3.40 cm     IVC diam: 1.40 cm RV S prime:     12.50 cm/s TAPSE (M-mode): 2.6 cm  LEFT ATRIUM              Index        RIGHT ATRIUM           Index LA diam:        4.80 cm  2.53 cm/m   RA Area:     21.10 cm LA Vol (A2C):   114.0 ml 60.08 ml/m  RA Volume:   58.90 ml  31.04 ml/m LA Vol (A4C):   133.0 ml 70.09 ml/m LA Biplane Vol: 124.0 ml 65.35 ml/m AORTIC VALVE AV Area (Vmax):    2.62 cm AV Area (Vmean):   2.50 cm AV Area (VTI):     2.64 cm AV  Vmax:           178.50 cm/s AV Vmean:          119.000 cm/s AV VTI:            0.428 m AV Peak Grad:      12.7 mmHg AV Mean Grad:      7.0 mmHg LVOT Vmax:         149.00 cm/s LVOT Vmean:        94.600 cm/s LVOT VTI:          0.359 m LVOT/AV VTI ratio: 0.84 AI PHT:            769 msec  AORTA Ao Root diam: 3.30 cm  MITRAL  VALVE                TRICUSPID VALVE MV Area (PHT): 1.42 cm     TR Peak grad:   26.4 mmHg MV Area VTI:   2.00 cm     TR Vmax:        257.00 cm/s MV Peak grad:  8.1 mmHg MV Mean grad:  3.0 mmHg     SHUNTS MV Vmax:       1.43 m/s     Systemic VTI:  0.36 m MV Vmean:      84.5 cm/s    Systemic Diam: 2.00 cm MV E velocity: 125.00 cm/s MV A velocity: 129.00 cm/s MV E/A ratio:  0.97  Riley Lam MD Electronically signed by Riley Lam MD Signature Date/Time: 01/04/2024/11:55:45 AM    Final  TEE  ECHO TEE 01/03/2024  Narrative TRANSESOPHOGEAL ECHO REPORT    Patient Name:   TYKIERA RAVEN Date of Exam: 01/03/2024 Medical Rec #:  [id number]         Height:       67.0 in Accession #:    [id number]        Weight:       166.0 lb Date of Birth:  03/03/1946         BSA:          1.868 m Patient Age:    77 years          BP:           151/80 mmHg Patient Gender: F                 HR:           62 bpm. Exam Location:  Inpatient  Procedure: Transesophageal Echo, 3D Echo, Cardiac Doppler and Color Doppler  Indications:     MitraClip  History:         Patient has prior history of Echocardiogram examinations, most recent 10/27/2023. Mitral Valve Disease, Signs/Symptoms:Murmur; Risk Factors:Hypertension.  Sonographer:     Darlys Gales Referring Phys:  [id number] Orbie Pyo Diagnosing Phys: Riley Lam MD  PROCEDURE: After discussion of the risks and benefits of a TEE, an informed consent was obtained from the patient. The transesophogeal probe was passed without difficulty through the esophogus of the patient. Sedation performed by  different physician. The patient developed no complications during the procedure. Gingival bleeding with chipped tooth prior to probe placement. No change in dentition post procecure. Bleeding stopped prior to heparinzation.  IMPRESSIONS   1. Prior to procedure- bi-leaflet prolapse with posterior flail and severe mitral regurgitation. Systolic blunting of pulmonary vein flow and post intubation loading conditions. Sufficiency height and leaflet length. Mean gradient of 1 mmHg with mitral valve area 6.14 cm2. Suitable for mTEER. During procedure, difficult transeptal puncture. Successful inferior, posterior puncture on the thin portion of the septum. A MitraClip XTW was placed medial to the A2/P2 position with 3D support. Mild mitral regurgitation was left with a double orifice area of 6.36 cm2. Mean gradient of 3. A second XTW clip was placed parallel to the first clip at the A2/P2 position with 3D support. Mean gradient of 6 mmHg. Trivial MR. LVOT VTI has doubled. Normalization of pulmonary vein flow in the left and right sided veins. After the procedure left to right ASD flow noted. Trival pericardial effusion is unchanged. Succesfull mTEER placement.. The mitral valve has been repaired/replaced. Severe mitral valve regurgitation. 2. Left ventricular ejection fraction, by estimation, is  55 to 60%. The left ventricle has normal function. 3. Right ventricular systolic function is normal. The right ventricular size is normal. 4. Left atrial size was moderately dilated. No left atrial/left atrial appendage thrombus was detected. 5. Right atrial size was moderately dilated. 6. The aortic valve is tricuspid. Aortic valve regurgitation is trivial. No aortic stenosis is present. 7. 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 8. Evidence of atrial level shunting detected by color flow Doppler.  FINDINGS Left Ventricle: Left ventricular ejection fraction, by estimation, is 55 to  60%. The left ventricle has normal function. The left ventricular internal cavity size was normal in size.  Right Ventricle: The right ventricular size is normal. No increase in right ventricular wall thickness. Right ventricular systolic function is normal.  Left Atrium: Left atrial size was moderately dilated. No left atrial/left atrial appendage thrombus was detected.  Right Atrium: Right atrial size was moderately dilated.  Pericardium: Trivial pericardial effusion is present. The pericardial effusion is circumferential.  Mitral Valve: Prior to procedure- bi-leaflet prolapse with posterior flail and severe mitral regurgitation. Systolic blunting of pulmonary vein flow and post intubation loading conditions. Sufficiency height and leaflet length. Mean gradient of 1 mmHg with mitral valve area 6.14 cm2. Suitable for mTEER. During procedure, difficult transeptal puncture. Successful inferior, posterior puncture on the thin portion of the septum. A MitraClip XTW was placed medial to the A2/P2 position with 3D support. Mild mitral regurgitation was left with a double orifice area of 6.36 cm2. Mean gradient of 3. A second XTW clip was placed parallel to the first clip at the A2/P2 position with 3D support. Mean gradient of 6 mmHg. Trivial MR. LVOT VTI has doubled. Normalization of pulmonary vein flow in the left and right sided veins. After the procedure left to right ASD flow noted. Trival pericardial effusion is unchanged. Succesfull mTEER placement. The mitral valve has been repaired/replaced. Severe mitral valve regurgitation. MV peak gradient, 6.8 mmHg. The mean mitral valve gradient is 3.5 mmHg.  Tricuspid Valve: The tricuspid valve is normal in structure. Tricuspid valve regurgitation is mild . No evidence of tricuspid stenosis.  Aortic Valve: The aortic valve is tricuspid. Aortic valve regurgitation is trivial. No aortic stenosis is present.  Pulmonic Valve: The pulmonic valve was not  well visualized. Pulmonic valve regurgitation is not visualized.  Aorta: The aortic root, ascending aorta and aortic arch are all structurally normal, with no evidence of dilitation or obstruction. There is moderate (Grade III) plaque involving the ascending aorta.  IAS/Shunts: Evidence of atrial level shunting detected by color flow Doppler.   AORTIC VALVE LVOT Vmax:   117.25 cm/s LVOT Vmean:  85.325 cm/s LVOT VTI:    0.252 m  MITRAL VALVE MV Peak grad: 6.8 mmHg  SHUNTS MV Mean grad: 3.5 mmHg  Systemic VTI: 0.25 m MV Vmax:      1.30 m/s MV Vmean:     85.0 cm/s  Riley Lam MD Electronically signed by Riley Lam MD Signature Date/Time: 01/03/2024/12:55:18 PM    Final  MONITORS  LONG TERM MONITOR (3-14 DAYS) 01/03/2024  Narrative Patch Wear Time:  8 days and 6 hours (2025-01-08T12:24:29-0500 to 2025-01-16T18:48:04-499)  Patient had a min HR of 45 bpm, max HR of 167 bpm, and avg HR of 62 bpm. Predominant underlying rhythm was Sinus Rhythm.  EVENTS: -71 Supraventricular Tachycardia runs occurred, the run with the fastest interval lasting 7 beats with a max rate of 167 bpm, the longest lasting 13 beats with an  avg rate of 116 bpm. Some episodes of Supraventricular Tachycardia may be possible Atrial Tachycardia with variable block.  -Isolated SVEs were rare (<1.0%), SVE Couplets were rare (<1.0%), and SVE Triplets were rare (<1.0%). Isolated VEs were rare (<1.0%), and no VE Couplets or VE Triplets were present. Difficulty discerning atrial activity during periods of high heart rate, making definitive diagnosis between Sinus Tachycardia and Atrial Tachycardia difficult to ascertain.  No atrial fibrillation, sustained ventricular tachyarrhythmias, or bradyarrhythmias were detected.  No patient triggered events.         Patient Profile     [name] is a 78 y.o. female with a hx of HTN, hx of thyroid cancer, and severe MR s/p mTEER with MitraClip XTW x2  in the medial A2/P2 and lateral A2/P2 positions with post-op acute stroke complication with no deficit. Neurology following.   Assessment & Plan    Severe mitral regurgitation: with posterior leaflet prolapse, ruptured chord, and flail P2 segment on pre-op TEE, she is now s/p successful MitraClip XTW x2 at A2/P2 reducing MR from 4+ to trace on post procedure echo on 01/03/24. Started on DAPT with ASA 81mg  and Plavix 75mg . Echo 01/04/24 with normal LV function at 60-65% with mild concentric LVH, severe LA dilation, trivial AR, and trivial MR s/p MitraClip XTW x2 in the A2/P2 medical and lateral A2/P2 positions with a mean gradient at . She will require lifelong dental SBE. This will be further discussed and RXd at follow up next week. Post procedure instructions reviewed. Plan TOC next week, then one month OV with repeat echocardiogram.  See neuro plan below.    Acute stroke: Patient reported acute left eye monocular blindness 01/04/24. CT performed with no acute intracranial abnormalities. No code stroke called given brief duration (20 sec with complete resolution). Neurology consulted with recommendations for MRI brain and CTA head/neck. MRI 1/23 showed scattered punctate acute infarcts in multiple vascular territories, including the left cerebellum, right occipital lobe, left parietal lobe, and right frontal lobe felt secondary to a central embolic etiology. Neurology felt peri-procedure related however workup to r/o other etiologies. CTA head with no large vessel occlusion or other emergent finding. See by PT/OT/SLP with no further follow up needed as patient felt to be back to baseline. No AF on telemetry.   SVT/Sinus tachycardia/atrial tachycardia: Recent complaints of tachy-palpitations with subsequent ZIO confirming predominant SR with 71 runs of SVT with intermittent possible atrial tachycardia episodes. Previously RXd PRN metoprolol however started on Toprol XL 12.5mg  this admission. This has  been temporarily held to allow for permissive HTN in the setting of acute stroke. Follow neuro recs for restart. Telemetry with NSR/SB with rates in the upper 50's-60's and no SVT/AT.    HTN: Stable. Will discuss antihypertensive plan with neurology.   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 Referral placed 01/05/24.   Signed, Georgie Chard, NP  01/05/2024, 10:08 AM  Pager [phone]   ATTENDING ATTESTATION:  After conducting a review of all available clinical information with the care team, interviewing the patient, and performing a physical exam, I agree with the findings and plan described in this note.   GEN: No acute distress.   HEENT:  MMM, no JVD, no scleral icterus Cardiac: RRR, no murmurs, rubs, or gallops.  Respiratory: Clear to auscultation bilaterally. GI:  Soft, nontender, non-distended  MS: No edema; No deformity. Neuro:  Nonfocal  Vasc:  +2 radial pulses  Patient doing well after mitral transcatheter edge-to-edge repair complicated by stroke.  She has no residual deficits whatsoever.  Will follow-up neurology recommendations and hopefully she can be discharged later today.  Alverda Skeans, MD Pager [phone]

## 2024-01-05 NOTE — Progress Notes (Addendum)
STROKE TEAM PROGRESS NOTE   BRIEF HPI [name] is a 78 y.o. female with history of severe mitral insufficiency, HTN, thyroid cancer and resultant hypothyroidism, GERD who is admitted for and underwent valve repair and had an episode of L eye vision loss for 20 secs with full and spontaneous resolution in about a minute. MRI Brain w/o contrast which demonstrated scattered embolic appearing infarcts.   NIH on Admission 0   SIGNIFICANT HOSPITAL EVENTS 1/22 admitted for planned TEER.  1/23  episode of L eye vision loss for 20 secs with full and spontaneous resolution in about a minute.  MRI Brain w/o contrast which demonstrated scattered embolic appearing infarcts  INTERIM HISTORY/SUBJECTIVE  Patient sitting in the chair.  Family at the bedside.  Patient had uneventful mitral valve repair and was all right for several hours after the procedure on Wednesday at 2 AM her left eye went blank for about 1-1/2 minutes and it went to graying and then was a splotch of blurriness and then back to normal within 3 minutes.  Has not recurred.  Denies any weakness facial droop slurred speech or numbness or tingling.  MRI Brain w/o contrast which demonstrated scattered embolic appearing infarcts Plan aspirin and Plavix x 3 weeks then aspirin alone.  Would recommend 30-day heart monitor at discharge for PAF  OBJECTIVE  CBC    Component Value Date/Time   WBC 6.5 01/04/2024 0217   RBC 3.58 (L) 01/04/2024 0217   HGB 10.9 (L) 01/04/2024 0217   HGB 13.3 12/26/2023 1227   HCT 32.8 (L) 01/04/2024 0217   HCT 40.5 12/26/2023 1227   PLT 156 01/04/2024 0217   PLT 180 12/26/2023 1227   MCV 91.6 01/04/2024 0217   MCV 95 12/26/2023 1227   MCH 30.4 01/04/2024 0217   MCHC 33.2 01/04/2024 0217   RDW 13.9 01/04/2024 0217   RDW 13.4 12/26/2023 1227   LYMPHSABS 1.7 05/06/2020 0922   MONOABS 0.4 12/19/2019 1212   EOSABS 0.3 05/06/2020 0922   BASOSABS 0.0 05/06/2020 0922    BMET    Component Value  Date/Time   NA 142 12/26/2023 1227   K 3.9 12/26/2023 1227   CL 108 (H) 12/26/2023 1227   CO2 20 12/26/2023 1227   GLUCOSE 87 12/26/2023 1227   GLUCOSE 83 10/27/2022 1100   BUN 15 12/26/2023 1227   CREATININE 1.05 (H) 01/04/2024 0217   CREATININE 0.73 09/10/2013 1016   CALCIUM 9.2 12/26/2023 1227   EGFR 58 (L) 12/26/2023 1227   GFRNONAA 54 (L) 01/04/2024 0217    IMAGING past 24 hours CT ANGIO HEAD NECK W WO CM Result Date: 01/05/2024 CLINICAL DATA:  Initial evaluation for monocular visual loss. EXAM: CT ANGIOGRAPHY HEAD AND NECK WITH AND WITHOUT CONTRAST TECHNIQUE: Multidetector CT imaging of the head and neck was performed using the standard protocol during bolus administration of intravenous contrast. Multiplanar CT image reconstructions and MIPs were obtained to evaluate the vascular anatomy. Carotid stenosis measurements (when applicable) are obtained utilizing NASCET criteria, using the distal internal carotid diameter as the denominator. RADIATION DOSE REDUCTION: This exam was performed according to the departmental dose-optimization program which includes automated exposure control, adjustment of the mA and/or kV according to patient size and/or use of iterative reconstruction technique. CONTRAST:  75mL OMNIPAQUE IOHEXOL 350 MG/ML SOLN COMPARISON:  Prior CT and MRI from earlier the same day. FINDINGS: CTA NECK FINDINGS Aortic arch: Visualized aortic arch within normal limits for caliber with standard 3 vessel morphology. Aortic atherosclerosis.  No high-grade stenosis or other acute abnormality about the origin the great vessels. Right carotid system: Right common and internal carotid arteries are tortuous but patent without dissection. Mild atheromatous change about the right carotid bulb without hemodynamically significant stenosis. Left carotid system: Left common and internal carotid arteries are tortuous with patent without dissection. Mild atheromatous change about the left carotid bulb  without hemodynamically significant stenosis. Vertebral arteries: Both vertebral arteries arise from the subclavian arteries. No proximal subclavian artery stenosis. Left vertebral artery slightly dominant. Vertebral arteries are patent without stenosis or dissection. Skeleton: No discrete or worrisome osseous lesions. Other neck: No other acute finding. 1.2 cm soft tissue mass present within the superficial right parotid gland, suspicious for a primary salivary neoplasm (series 5, image 78). Prior thyroidectomy. Upper chest: No other acute finding. Review of the MIP images confirms the above findings CTA HEAD FINDINGS Anterior circulation: Mild atheromatous change about the carotid siphons without hemodynamically significant stenosis. A1 segments patent bilaterally. Right A1 hypoplastic. Normal anterior communicating artery complex. Anterior cerebral arteries patent without stenosis. No M1 stenosis or occlusion. Distal MCA branches perfused and symmetric. Posterior circulation: Both V4 segments patent without stenosis. Right PICA patent. Left PICA origin not well seen. Basilar patent without stenosis. Superior cerebral arteries patent bilaterally. Left PCA supplied via the basilar. Fetal type origin of the right PCA. Both PCAs are patent without stenosis. Venous sinuses: Patent allowing for timing the contrast bolus. Anatomic variants: As above.  No aneurysm. Review of the MIP images confirms the above findings IMPRESSION: 1. Negative CTA for large vessel occlusion or other emergent finding. 2. Mild atheromatous change about the carotid bifurcations and carotid siphons without hemodynamically significant or correctable stenosis. 3. 1.2 cm soft tissue mass within the superficial right parotid gland, suspicious for a primary salivary neoplasm. Nonemergent outpatient ENT referral suggested for further workup and evaluation. 4.  Aortic Atherosclerosis (ICD10-I70.0). Electronically Signed   By: Rise Mu M.D.    On: 01/05/2024 04:19    Vitals:   01/05/24 0451 01/05/24 0750 01/05/24 0902 01/05/24 1219  BP: (!) 140/80 121/67  139/72  Pulse: 60 64 66 66  Resp: 17 16  18   Temp: 98.1 F (36.7 C) 98.2 F (36.8 C)  97.6 F (36.4 C)  TempSrc: Oral Oral  Oral  SpO2: 96% 95%  98%  Weight:      Height:         PHYSICAL EXAM General:  Alert, well-nourished, well-developed pleasant elderly Caucasian lady in no acute distress Psych:  Mood and affect appropriate for situation CV: Regular rate and rhythm on monitor Respiratory:  Regular, unlabored respirations on room air GI: Abdomen soft and nontender   NEURO:  Mental Status: AA&Ox3, patient is able to give clear and coherent history Speech/Language: speech is without dysarthria or aphasia.  Naming, repetition, fluency, and comprehension intact.  Cranial Nerves:  II: PERRL. Visual fields full.  III, IV, VI: EOMI. Eyelids elevate symmetrically.  V: Sensation is intact to light touch and symmetrical to face.  VII: Face is symmetrical resting and smiling VIII: hearing intact to voice. IX, X: Palate elevates symmetrically. Phonation is normal.  YN:WGNFAOZH shrug 5/5. XII: tongue is midline without fasciculations. Motor: 5/5 strength to all muscle groups tested.  Tone: is normal and bulk is normal Sensation- Intact to light touch bilaterally. Extinction absent to light touch to DSS.   Coordination: FTN intact bilaterally, HKS: no ataxia in BLE.No drift.  Gait- deferred  Most Recent NIH 0  ASSESSMENT/PLAN  Acute Ischemic Infarct: Scattered bilateral- left cerebellum, right occipital, left parietal and right frontal likely periprocedural mitral valve repair Left eye Amaurosis fugax resolved but interestingly developed several hours after the procedure Etiology: Cardioembolic periprocedural mitral valve replacement CT head No acute abnormality. Small vessel disease.  CTA head & neck no LVO MRI  Scattered punctate acute infarcts in multiple  vascular territories, including the left cerebellum, right occipital lobe, left parietal lobe, and right frontal lobe 2D Echo EF 60 to 65%.  LV mild concentric hypertrophy.  Left atrium severely dilated Recommend 30-day heart monitor LDL 92 HgbA1c 4.7 VTE prophylaxis -heparin subcu No antithrombotic prior to admission, now on aspirin 81 mg daily and clopidogrel 75 mg daily for 3 weeks and then aspirin alone. Therapy recommendations:  No follow up needed  Disposition: Home  Hypertension Severe mitral insufficiency s/p mitral valve clip implantation Home meds: Enalapril 10 mg, Toprol 25 mg Stable Blood Pressure Goal: SBP less than 160   Hyperlipidemia Home meds: None LDL 92, goal < 70 Add Crestor 20 mg Continue statin at discharge  Dysphagia Patient has post-stroke dysphagia, SLP consulted    Diet   Diet Heart Room service appropriate? Yes; Fluid consistency: Thin   Advance diet as tolerated  Other Stroke Risk Factors   Other Active Problems   Hospital day # 2   Gevena Mart DNP, ACNPC-AG  [facility]   I have personally obtained history,examined this patient, reviewed notes, independently viewed imaging studies, participated in medical decision making and plan of care.ROS completed by me personally and pertinent positives fully documented  I have made any additions or clarifications directly to the above note. Agree with note above.  Patient underwent elective mitral valve repair and did all right for several hours after that but subsequently developed transient left eye tenderness vision loss and MRI scan shows tiny bilateral scattered embolic strokes likely clinically silent and periprocedural.  Recommend aspirin Plavix for 3 weeks followed by aspirin alone and aggressive risk factor modification.  Recommend 30-day heart monitor at discharge to look for paroxysmal A-fib.  Long discussion with patient and family and answered questions.  Discussed with Dr.  Lynnette Caffey cardiology.  Greater than 50% time during this 50-minute visit was spent in counseling and coordination of care and discussion patient and care team and answering questions  Delia Heady, MD Medical Director [facility] Pager: [phone] 01/05/2024 4:53 PM   To contact Stroke Continuity provider, please refer to [email or web address]. After hours, contact General Neurology

## 2024-01-05 NOTE — Evaluation (Signed)
Speech Language Pathology Evaluation Patient Details Name: [name] MRN: [id number] DOB: 25-Oct-1946 Today's Date: 01/05/2024 Time: 0955-1005 SLP Time Calculation (min) (ACUTE ONLY): 10 min  Problem List:  Patient Active Problem List   Diagnosis Date Noted   CVA (cerebral vascular accident) (HCC) 01/05/2024   Parotid mass 01/05/2024   Transient monocular blindness, left 01/04/2024   OAB (overactive bladder) 01/04/2024   Severe mitral regurgitation 01/03/2024   S/P mitral valve clip implantation 01/03/2024   Ruptured chordae tendineae (HCC) 11/22/2023   CAP (community acquired pneumonia) 11/22/2023   Status post total replacement of left hip 11/21/2022   MR (mitral regurgitation) 11/08/2022   Murmur, cardiac 11/01/2022   GERD (gastroesophageal reflux disease) 05/11/2022   Urge incontinence 02/21/2022   B12 deficiency 11/10/2021   Vitamin D deficiency 11/10/2021   Status post total hip replacement, right 12/22/2020   History of gastric bypass 11/02/2020   History of thyroid cancer 10/22/2020   Hypothyroidism, 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   Essential hypertension, benign 09/09/2013   Hyperlipidemia 09/09/2013   Past Medical History:  Past Medical History:  Diagnosis Date   Arthritis    Cancer (HCC)    Thyroid cancer   COVID-19 virus infection 12/2020   Family history of adverse reaction to anesthesia    mother "couldn't wake up good" and would throw up   GERD (gastroesophageal reflux disease)    Heart murmur    History of kidney stones    Hypercholesteremia    resolved after gastric bypass   Hypertension    Hypothyroidism    Pneumonia 11/22/2023   LLL   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 Lynnette Caffey   Severe mitral insufficiency    Tachycardia    Past Surgical History:  Past Surgical History:  Procedure Laterality Date   ABDOMINAL HYSTERECTOMY     BALLOON DILATION N/A 07/17/2023   Procedure: BALLOON DILATION;  Surgeon:  Lanelle Bal, DO;  Location: [facility];  Service: Endoscopy;  Laterality: N/A;   BILATERAL SALPINGOOPHORECTOMY     BREAST EXCISIONAL BIOPSY Bilateral 1995   benign   CHOLECYSTECTOMY     COLONOSCOPY  11/14/2011   Procedure: COLONOSCOPY;  Surgeon: Corbin Ade, MD;  Location: [facility];  Service: Endoscopy;  Laterality: N/A;   ESOPHAGOGASTRODUODENOSCOPY (EGD) WITH PROPOFOL N/A 07/17/2023   Procedure: ESOPHAGOGASTRODUODENOSCOPY (EGD) WITH L;  Surgeon: Lanelle Bal, DO;  Location: [facility];  Service: Endoscopy;  Laterality: N/A;   REPLACEMENT TOTAL KNEE BILATERAL     RIGHT/LEFT HEART CATH AND CORONARY ANGIOGRAPHY N/A 11/16/2023   Procedure: RIGHT/LEFT HEART CATH AND CORONARY ANGIOGRAPHY;  Surgeon: Orbie Pyo, MD;  Location: [facility];  Service: Cardiovascular;  Laterality: N/A;   ROUX-EN-Y PROCEDURE  2007   TEE WITHOUT CARDIOVERSION N/A 11/11/2022   Procedure: TRANSESOPHAGEAL ECHOCARDIOGRAM (TEE);  Surgeon: Marjo Bicker, MD;  Location: [facility];  Service: Cardiovascular;  Laterality: N/A;   TONSILLECTOMY     TOTAL HIP ARTHROPLASTY Right 12/23/2019   Procedure: RIGHT TOTAL HIP ARTHROPLASTY ANTERIOR APPROACH;  Surgeon: Tarry Kos, MD;  Location: [facility] OR;  Service: Orthopedics;  Laterality: Right;   TOTAL HIP ARTHROPLASTY Left 11/21/2022   Procedure: LEFT TOTAL HIP ARTHROPLASTY ANTERIOR APPROACH;  Surgeon: Tarry Kos, MD;  Location: [facility] OR;  Service: Orthopedics;  Laterality: Left;  3-C   TOTAL THYROIDECTOMY  12/2010   TRANSCATHETER MITRAL EDGE TO EDGE REPAIR N/A 01/03/2024   Procedure: TRANSCATHETER MITRAL EDGE  TO EDGE REPAIR;  Surgeon: Orbie Pyo, MD;  Location: [facility];  Service: Cardiovascular;  Laterality: N/A;   TRANSESOPHAGEAL ECHOCARDIOGRAM (CATH LAB) N/A 10/27/2023   Procedure: TRANSESOPHAGEAL ECHOCARDIOGRAM;  Surgeon: Wendall Stade, MD;  Location[facility] INVASIVE CV LAB;  Service: Cardiovascular;  Laterality: N/A;   TRANSESOPHAGEAL  ECHOCARDIOGRAM (CATH LAB) N/A 01/03/2024   Procedure: TRANSESOPHAGEAL ECHOCARDIOGRAM;  Surgeon: Orbie Pyo, MD;  Location: [facility] INVASIVE CV LAB;  Service: Cardiovascular;  Laterality: N/A;   TUBAL LIGATION     HPI:  [name] is a 78 yo female presenting with mitral insufficiency for a mitral valve repair with clip implant. Pt experienced acute vision loss in L eye following surgery. MRI Brain showed scattered embolic infarcts. PMH includes thyroid cancer, HTN   Assessment / Plan / Recommendation Clinical Impression  Pt states that she lives alone and is typically independent. She scored WFL on all tasks assessed this date and does not require further SLP f/u. Will s/o at this time.    SLP Assessment  SLP Recommendation/Assessment: Patient does not need any further Speech Lanaguage Pathology Services SLP Visit Diagnosis: Cognitive communication deficit (R41.841)    Recommendations for follow up therapy are one component of a multi-disciplinary discharge planning process, led by the attending physician.  Recommendations may be updated based on patient status, additional functional criteria and insurance authorization.    Follow Up Recommendations  No SLP follow up    Assistance Recommended at Discharge  None  Functional Status Assessment Patient has not had a recent decline in their functional status  Frequency and Duration           SLP Evaluation Cognition  Overall Cognitive Status: Within Functional Limits for tasks assessed Orientation Level: Oriented X4       Comprehension  Auditory Comprehension Overall Auditory Comprehension: Appears within functional limits for tasks assessed    Expression Expression Primary Mode of Expression: Verbal Verbal Expression Overall Verbal Expression: Appears within functional limits for tasks assessed   Oral / Motor  Oral Motor/Sensory Function Overall Oral Motor/Sensory Function: Within functional limits Motor Speech Overall  Motor Speech: Appears within functional limits for tasks assessed            [name], M.A., CF-SLP Speech Language Pathology, Acute Rehabilitation Services  Secure Chat preferred [phone]  01/05/2024, 10:36 AM

## 2024-01-05 NOTE — Progress Notes (Signed)
Mobility Specialist Progress Note;   01/05/24 1020  Mobility  Activity Ambulated independently in hallway  Level of Assistance Modified independent, requires aide device or extra time  Assistive Device None  Distance Ambulated (ft) 450 ft  Activity Response Tolerated well  Mobility Referral Yes  Mobility visit 1 Mobility  Mobility Specialist Start Time (ACUTE ONLY) 1020  Mobility Specialist Stop Time (ACUTE ONLY) 1030  Mobility Specialist Time Calculation (min) (ACUTE ONLY) 10 min   Pt eager for more mobility. Required no physical assistance throughout ambulation, ModI. VSS throughout and no c/o. Pt returned back to chair with all needs met. Visitor in room.   Caesar Bookman Mobility Specialist Please contact via SecureChat or [employer] [phone]

## 2024-01-05 NOTE — Progress Notes (Signed)
  [facility]   MULTIDISCIPLINARY HEART VALVE TEAM   Crestor added to regimen given LDL on Lipid panel today >70 per neurology recommendations.   Georgie Chard NP-C Structural Heart Team  Pager: [phone] Phone: [phone]

## 2024-01-05 NOTE — Evaluation (Signed)
Occupational Therapy Evaluation and Discharge Summary Patient Details Name: [name] MRN: [id number] DOB: [birth year] Today's Date: 01/05/2024   History of Present Illness Pt is a 78 yo female admitted with mitral insufficiency for a mitral valve repair with clip implant. Following surgery pt experienced acute vision loss in L eye for a few seconds that returned to normal in 1 minute.  MRI showed scattered embolic infarcts.  PMH: thyroid CA, HTN.   Clinical Impression   Pt admitted with the above diagnosis and overall has returned to baseline.  Pt lives alone but has many family members that live close by that can check on her or stay with her if needed. Pt is currently independent with basic mobility and all adls. Feel pt will be safe to return home with family checking in on her PRN.  No further acute OT needs at this time.        If plan is discharge home, recommend the following: Other (comment) (no assist needed)    Functional Status Assessment  Patient has not had a recent decline in their functional status  Equipment Recommendations  None recommended by OT    Recommendations for Other Services       Precautions / Restrictions Precautions Precautions: None Restrictions Weight Bearing Restrictions Per Provider Order: No      Mobility Bed Mobility Overal bed mobility: Modified Independent             General bed mobility comments: no assist needed    Transfers Overall transfer level: Modified independent Equipment used: None               General transfer comment: no assist needed      Balance Overall balance assessment: No apparent balance deficits (not formally assessed)                                         ADL either performed or assessed with clinical judgement   ADL Overall ADL's : At baseline;Independent                                       General ADL Comments: Pt is fully independent and at  baseline with all basic adls.     Vision Baseline Vision/History: 1 Wears glasses Ability to See in Adequate Light: 0 Adequate Patient Visual Report: No change from baseline Vision Assessment?: Yes Eye Alignment: Within Functional Limits Ocular Range of Motion: Within Functional Limits Alignment/Gaze Preference: Within Defined Limits Tracking/Visual Pursuits: Able to track stimulus in all quads without difficulty Saccades: Within functional limits Convergence: Within functional limits Visual Fields: No apparent deficits Additional Comments: Vision appears totally intact when wearing glasses.     Perception Perception: Within Functional Limits       Praxis Praxis: WFL       Pertinent Vitals/Pain Pain Assessment Pain Assessment: No/denies pain     Extremity/Trunk Assessment Upper Extremity Assessment Upper Extremity Assessment: Overall WFL for tasks assessed   Lower Extremity Assessment Lower Extremity Assessment: Overall WFL for tasks assessed   Cervical / Trunk Assessment Cervical / Trunk Assessment: Normal   Communication Communication Communication: No apparent difficulties   Cognition Arousal: Alert Behavior During Therapy: WFL for tasks assessed/performed Overall Cognitive Status: Within Functional Limits for tasks assessed  General Comments  Pt appears to be functioning at baseline.  Completed basic adls in room, walked in hallway with no LOB, picked items off floor all with no issues.    Exercises     Shoulder Instructions      Home Living Family/patient expects to be discharged to:: Private residence Living Arrangements: Alone Available Help at Discharge: Available 24 hours/day;Family Type of Home: House Home Access: Stairs to enter [employer] of Steps: 3 Entrance Stairs-Rails: None (Pt has a way into house that does not involve any steps if needed) Home Layout: One level      Bathroom Shower/Tub: Producer, television/film/video: Handicapped height     Home Equipment: Agricultural consultant (2 wheels);BSC/3in1;Shower seat   Additional Comments: Pt has equipment from elderly mother that has passed but does not use or need it.      Prior Functioning/Environment Prior Level of Function : Independent/Modified Independent;Driving             Mobility Comments: independent ADLs Comments: independent        OT Problem List: Other (comment) (none)      OT Treatment/Interventions:      OT Goals(Current goals can be found in the care plan section) Acute Rehab OT Goals Patient Stated Goal: to go home today OT Goal Formulation: All assessment and education complete, DC therapy  OT Frequency:      Co-evaluation              AM-PAC OT "6 Clicks" Daily Activity     Outcome Measure Help from another person eating meals?: None Help from another person taking care of personal grooming?: None Help from another person toileting, which includes using toliet, bedpan, or urinal?: None Help from another person bathing (including washing, rinsing, drying)?: None Help from another person to put on and taking off regular upper body clothing?: None Help from another person to put on and taking off regular lower body clothing?: None 6 Click Score: 24   End of Session Nurse Communication: Mobility status  Activity Tolerance: Patient tolerated treatment well Patient left: in chair;with call bell/phone within reach  OT Visit Diagnosis: Hemiplegia and hemiparesis Hemiplegia - Right/Left: Left Hemiplegia - dominant/non-dominant: Non-Dominant Hemiplegia - caused by: Cerebral infarction                Time: 1308-6578 OT Time Calculation (min): 19 min Charges:  OT General Charges $OT Visit: 1 Visit OT Evaluation $OT Eval Low Complexity: 1 Low  Hope Budds 01/05/2024, 8:44 AM

## 2024-01-05 NOTE — Progress Notes (Signed)
PT Cancellation Note  Patient Details Name: [name] MRN: [id number] DOB: 1946-01-16   Cancelled Treatment:    Reason Eval/Treat Not Completed: OT screened, no needs identified, will sign off   Claudia Desanctis 01/05/2024, 3:54 PM

## 2024-01-07 ENCOUNTER — Encounter (HOSPITAL_COMMUNITY): Payer: Self-pay | Admitting: Internal Medicine

## 2024-01-07 LAB — TYPE AND SCREEN
ABO/RH(D): O POS
Antibody Screen: NEGATIVE
Unit division: 0
Unit division: 0

## 2024-01-07 LAB — BPAM RBC
Blood Product Expiration Date: 202502132359
Blood Product Expiration Date: 202502132359
Unit Type and Rh: 5100
Unit Type and Rh: 5100

## 2024-01-08 ENCOUNTER — Encounter: Payer: Self-pay | Admitting: *Deleted

## 2024-01-08 ENCOUNTER — Ambulatory Visit: Payer: Medicare HMO | Attending: Cardiology | Admitting: Cardiology

## 2024-01-08 ENCOUNTER — Other Ambulatory Visit: Payer: Self-pay | Admitting: Cardiology

## 2024-01-08 ENCOUNTER — Telehealth: Payer: Self-pay | Admitting: *Deleted

## 2024-01-08 VITALS — BP 130/82 | HR 79 | Ht 67.0 in | Wt 166.0 lb

## 2024-01-08 DIAGNOSIS — I639 Cerebral infarction, unspecified: Secondary | ICD-10-CM

## 2024-01-08 DIAGNOSIS — I10 Essential (primary) hypertension: Secondary | ICD-10-CM

## 2024-01-08 DIAGNOSIS — K118 Other diseases of salivary glands: Secondary | ICD-10-CM

## 2024-01-08 DIAGNOSIS — I511 Rupture of chordae tendineae, not elsewhere classified: Secondary | ICD-10-CM | POA: Diagnosis not present

## 2024-01-08 DIAGNOSIS — R002 Palpitations: Secondary | ICD-10-CM | POA: Diagnosis not present

## 2024-01-08 DIAGNOSIS — I340 Nonrheumatic mitral (valve) insufficiency: Secondary | ICD-10-CM

## 2024-01-08 DIAGNOSIS — Z95818 Presence of other cardiac implants and grafts: Secondary | ICD-10-CM | POA: Diagnosis not present

## 2024-01-08 DIAGNOSIS — I6310 Cerebral infarction due to embolism of unspecified precerebral artery: Secondary | ICD-10-CM | POA: Diagnosis not present

## 2024-01-08 DIAGNOSIS — Z9889 Other specified postprocedural states: Secondary | ICD-10-CM | POA: Diagnosis not present

## 2024-01-08 MED ORDER — METOPROLOL TARTRATE 25 MG PO TABS: 25.0000 mg | ORAL_TABLET | Freq: Two times a day (BID) | ORAL | 3 refills | Status: DC

## 2024-01-08 MED ORDER — AMOXICILLIN 500 MG PO TABS: ORAL_TABLET | ORAL | 12 refills | Status: AC

## 2024-01-08 NOTE — Transitions of Care (Post Inpatient/ED Visit) (Signed)
01/08/2024  Name: [name] MRN: [id number] DOB: 13-Dec-1945  Today's TOC FU Call Status: Today's TOC FU Call Status:: Successful TOC FU Call Completed TOC FU Call Complete Date: 01/08/24 Patient's Name and Date of Birth confirmed.  Transition Care Management Follow-up Telephone Call Date of Discharge: 01/05/24 Discharge Facility: [facility]) Type of Discharge: Inpatient Admission Primary Inpatient Discharge Diagnosis:: S/P Mitral Valve Clip Implantation How have you been since you were released from the hospital?: Better (pt states " I feel better than I have in a long time",  ambulating well, eating well, no issues wt/ bowel/ bladder) Any questions or concerns?: No  Items Reviewed: Did you receive and understand the discharge instructions provided?: Yes Medications obtained,verified, and reconciled?: Yes (Medications Reviewed) Any new allergies since your discharge?: No Dietary orders reviewed?: Yes Type of Diet Ordered:: heart healthy Do you have support at home?: Yes People in Home: alone Name of Support/Comfort Primary Source: daugher [name] Patient declined enrollment in 30 day program  Medications Reviewed Today: Medications Reviewed Today     Reviewed by Audrie Gallus, RN (Registered Nurse) on 01/08/24 at 1251  Med List Status: <None>   Medication Order Taking? Sig Documenting Provider Last Dose Status Informant  amoxicillin (AMOXIL) 500 MG tablet [id number] No TAKE 4 TABLETS BY MOUTH 1 HOUR PRIOR TO DENTAL PROCEDURES  Patient not taking: Reported on 01/08/2024   Filbert Schilder, NP Not Taking Active   aspirin 81 MG chewable tablet [id number] Yes Chew 1 tablet (81 mg total) by mouth daily. Georgie Chard D, NP Taking Active   Biotin 5000 MCG CAPS [id number] Yes Take 5,000 mcg by mouth daily. [provider] Taking Active Self  Calcium Citrate-Vitamin D (CALCIUM CITRATE+D3 PO) [id number] Yes Take 1 tablet by mouth daily. [provider] Taking  Active Self  carboxymethylcellulose (REFRESH PLUS) 0.5 % SOLN [id number] Yes Place 1 drop into both eyes 3 (three) times daily as needed (Dry eye). [provider] Taking Active Self  Cholecalciferol (VITAMIN D3 ULTRA STRENGTH) 125 MCG (5000 UT) capsule [id number] Yes Take 5,000 Units by mouth daily. [provider] Taking Active Self  clopidogrel (PLAVIX) 75 MG tablet [id number] Yes Take 1 tablet (75 mg total) by mouth daily with breakfast. Filbert Schilder, NP Taking Active   Cyanocobalamin 5000 MCG TBDP [id number] Yes Take 5,000 mcg by mouth daily. [provider] Taking Active Self  enalapril (VASOTEC) 10 MG tablet [id number] Yes Take 1 tablet (10 mg total) by mouth daily. Tommie Sams, DO Taking Active Self           Med Note Kimber Relic Jan 01, 2024  3:03 PM) Bedtime  ferrous sulfate 325 (65 FE) MG tablet [id number] Yes Take 325 mg by mouth daily. [provider] Taking Active Self           Med Note Joella Prince A   Mon Oct 23, 2023 11:00 AM)    levothyroxine (SYNTHROID) 150 MCG tablet [id number] Yes Take 1 tablet (150 mcg total) by mouth daily before breakfast.  Patient taking differently: Take 150 mcg by mouth See admin instructions. *MUST TAKE BRAND* Take 150 mg daily except skip dose on Sundays   Maxie Barb, MD Taking Active Self           Med Note Richardean Canal Dec 12, 2019 12:37 PM)    Magnesium 250 MG CAPS [id number] Yes Take 250 mg by mouth daily. [provider]  Taking Active Self  metoprolol tartrate (LOPRESSOR) 25 MG tablet [id number] Yes Take 1 tablet (25 mg total) by mouth 2 (two) times daily. Filbert Schilder, NP Taking Active   Multiple Vitamin (MULTIVITAMIN WITH MINERALS) TABS tablet [id number] Yes Take 1 tablet by mouth in the morning and at bedtime. Woman 50+ [provider] Taking Active Self  pantoprazole (PROTONIX) 40 MG tablet [id number] Yes Take 1 tablet (40 mg total) by mouth daily.  Georgie Chard D, NP Taking Active   promethazine-dextromethorphan (PROMETHAZINE-DM) 6.25-15 MG/5ML syrup [id number] Yes Take 5 mLs by mouth 4 (four) times daily as needed for cough. Tommie Sams, DO Taking Active Self  rosuvastatin (CRESTOR) 20 MG tablet [id number] Yes Take 1 tablet (20 mg total) by mouth daily. Georgie Chard D, NP Taking Active   valACYclovir (VALTREX) 1000 MG tablet [id number] Yes TAKE 2 TABLETS AT FIRST SIGN OF FEVER BLISTER, THEN 2 TABLETS 2 HOURSLATER Cook, [place], DO Taking Active Self  Zinc 50 MG TABS [id number] Yes Take 50 mg by mouth daily. [provider] Taking Active Self            Home Care and Equipment/Supplies: Were Home Health Services Ordered?: No Any new equipment or medical supplies ordered?: No  Functional Questionnaire: Do you need assistance with bathing/showering or dressing?: No Do you need assistance with meal preparation?: No Do you need assistance with eating?: No Do you have difficulty maintaining continence: No Do you need assistance with getting out of bed/getting out of a chair/moving?: No Do you have difficulty managing or taking your medications?: No  Follow up appointments reviewed: PCP Follow-up appointment confirmed?: No (pt feels cardiologist f/u was most important, will not be scheduling PCP appt at present) MD Provider Line [phone] Given: No Specialist Hospital Follow-up appointment confirmed?: Yes Date of Specialist follow-up appointment?: 01/08/24 Follow-Up Specialty Provider:: Georgie Chard NP  pt saw at 1110 am Do you need transportation to your follow-up appointment?: No Do you understand care options if your condition(s) worsen?: Yes-patient verbalized understanding  SDOH Interventions Today    Flowsheet Row Most Recent Value  SDOH Interventions   Food Insecurity Interventions Intervention Not Indicated  Housing Interventions Intervention Not Indicated  Transportation Interventions Intervention  Not Indicated  Utilities Interventions Intervention Not Indicated       Irving Shows [facility] - [facility], BSN RN Care Manager/ Transition of Care [place] [employer] [facility] [phone]

## 2024-01-08 NOTE — Progress Notes (Signed)
Patient enrolled for [employer]/ [employer] to ship a 30 day cardiac event monitor to her address on file.  Dr. Tenny Craw to read.

## 2024-01-08 NOTE — Patient Instructions (Addendum)
Medication Instructions:  Your physician has recommended you make the following change in your medication:   STOP Toprol XL  START Metoprolol Tartrate 25 mg taking 1 twice a day  *If you need a refill on your cardiac medications before your next appointment, please call your pharmacy*   Lab Work: None ordered  If you have labs (blood work) drawn today and your tests are completely normal, you will receive your results only by: MyChart Message (if you have MyChart) OR A paper copy in the mail If you have any lab test that is abnormal or we need to change your treatment, we will call you to review the results.   Testing/Procedures: [employer] Cardiac Event Monitor Instructions  Your physician has requested you wear your cardiac event monitor for 30 days, (1-30). [employer] may call or text to confirm a shipping address. The monitor will be sent to a land address via UPS. [employer] will not ship a monitor to a PO BOX. It typically takes 3-5 days to receive your monitor after it has been enrolled. [employer] will assist with USPS tracking if your package is delayed. The telephone number for [employer] is [phone]. Once you have received your monitor, please review the enclosed instructions. Instruction tutorials can also be viewed under help and settings on the enclosed cell phone. Your monitor has already been registered assigning a specific monitor serial # to you.  Billing and Self Pay Discount Information  [employer] has been provided the insurance information we had on file for you.  If your insurance has been updated, please call [employer] at [phone] to provide them with your updated insurance information.   [employer] offers a discounted Self Pay option for patients who have insurance that does not cover their cardiac event monitor or patients without insurance.  The discounted cost of a Self Pay Cardiac Event Monitor would be $225.00 , if the patient contacts  [employer] at [phone] within 7 days of applying the monitor to make payment arrangements.  If the patient does not contact [employer] within 7 days of applying the monitor, the cost of the cardiac event monitor will be $350.00.  Applying the monitor  Remove cell phone from case and turn it on. The cell phone works as IT consultant and needs to be within [employer] of you at all times. The cell phone will need to be charged on a daily basis. We recommend you plug the cell phone into the enclosed charger at your bedside table every night.  Monitor batteries: You will receive two monitor batteries labelled #1 and #2. These are your recorders. Plug battery #2 onto the second connection on the enclosed charger. Keep one battery on the charger at all times. This will keep the monitor battery deactivated. It will also keep it fully charged for when you need to switch your monitor batteries. A small light will be blinking on the battery emblem when it is charging. The light on the battery emblem will remain on when the battery is fully charged.  Open package of a Monitor strip. Insert battery #1 into black hood on strip and gently squeeze monitor battery onto connection as indicated in instruction booklet. Set aside while preparing skin.  Choose location for your strip, vertical or horizontal, as indicated in the instruction booklet. Shave to remove all hair from location. There cannot be any lotions, oils, powders, or colognes on skin where monitor is to be applied. Wipe skin clean with enclosed Saline wipe. Dry skin completely.  Peel  paper labeled #1 off the back of the Monitor strip exposing the adhesive. Place the monitor on the chest in the vertical or horizontal position shown in the instruction booklet. One arrow on the monitor strip must be pointing upward. Carefully remove paper labeled #2, attaching remainder of strip to your skin. Try not to create any folds or wrinkles in the strip  as you apply it.  Firmly press and release the circle in the center of the monitor battery. You will hear a small beep. This is turning the monitor battery on. The heart emblem on the monitor battery will light up every 5 seconds if the monitor battery in turned on and connected to the patient securely. Do not push and hold the circle down as this turns the monitor battery off. The cell phone will locate the monitor battery. A screen will appear on the cell phone checking the connection of your monitor strip. This may read poor connection initially but change to good connection within the next minute. Once your monitor accepts the connection you will hear a series of 3 beeps followed by a climbing crescendo of beeps. A screen will appear on the cell phone showing the two monitor strip placement options. Touch the picture that demonstrates where you applied the monitor strip.  Your monitor strip and battery are waterproof. You are able to shower, bathe, or swim with the monitor on. They just ask you do not submerge deeper than 3 feet underwater. We recommend removing the monitor if you are swimming in a lake, river, or ocean.  Your monitor battery will need to be switched to a fully charged monitor battery approximately once a week. The cell phone will alert you of an action which needs to be made.  On the cell phone, tap for details to reveal connection status, monitor battery status, and cell phone battery status. The green dots indicates your monitor is in good status. A red dot indicates there is something that needs your attention.  To record a symptom, click the circle on the monitor battery. In 30-60 seconds a list of symptoms will appear on the cell phone. Select your symptom and tap save. Your monitor will record a sustained or significant arrhythmia regardless of you clicking the button. Some patients do not feel the heart rhythm irregularities. [employer] will notify us of any  serious or critical events.  Refer to instruction booklet for instructions on switching batteries, changing strips, the Do not disturb or Pause features, or any additional questions.  Call [employer] at [phone], to confirm your monitor is transmitting and record your baseline. They will answer any questions you may have regarding the monitor instructions at that time.  Returning the monitor to [employer]  Place all equipment back into blue box. Peel off strip of paper to expose adhesive and close box securely. There is a prepaid UPS shipping label on this box. Drop in a UPS drop box, or at a UPS facility like [employer]. You may also contact [employer] to arrange UPS to pick up monitor package at your home.    Follow-Up: At [facility], you and your health needs are our priority.  As part of our continuing mission to provide you with exceptional heart care, we have created designated Provider Care Teams.  These Care Teams include your primary Cardiologist (physician) and Advanced Practice Providers (APPs -  Physician Assistants and Nurse Practitioners) who all work together to provide you with the care you need, when you need it.  We recommend signing up for the patient portal called "MyChart".  Sign up information is provided on this After Visit Summary.  MyChart is used to connect with patients for Virtual Visits (Telemedicine).  Patients are able to view lab/test results, encounter notes, upcoming appointments, etc.  Non-urgent messages can be sent to your provider as well.   To learn more about what you can do with MyChart, go to [email or web address].    Your next appointment:   As scheduled  Provider:   Georgie Chard, NP or Carlean Jews, PA-C    Other Instructions

## 2024-01-08 NOTE — Progress Notes (Signed)
[facility]   MULTIDISCIPLINARY HEART VALVE TEAM  Structural Heart Office Note:  .    Date:  01/08/2024  ID:  [name], DOB 1946-03-03, MRN [id number] PCP: Tommie Sams, DO  [facility] Providers Cardiologist:  Dietrich Pates, MD  History of Present Illness: Marland Kitchen   [name] is a 78 y.o. female with a history of mitral valve regurgitation, HTN and history of thyroid cancer who presented to [facility] on 01/03/24 for planned TEER.    [name] is followed by Dr. Tenny Craw for her cardiology care. Previous TEE had a mobile echodensity attached possibly to the anterior mitral valve leaflet and was also noted to have moderate eccentric anteriorly directed mitral valve regurgitation. Plan was for repeat TEE. This was attempted in 11/2022 but unsuccessful due to inability to advance the probe. At the time of her visit, it was recommended that she keep follow-up with GI for further evaluation of her esophagus and TEE could be performed afterwards. She was seen by GI and had an EGD 07/2023 which showed no obvious stricture in the oropharynx but was slightly torturous. 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 Subsequent mTEER workup with R/LHC with no obstructive CAD.    She was seen for pre-op evaluation and reported episodes of intermittent tachyrhythmia which woke her from sleep. HR's nearing 160bpm and BP around 182/103 on her Apple watch and home BP cuff. ZIO monitor was placed which resulted 1/21 and showed predominant NSR with episodes of SVT versus Atrial Tachycardia. She was prescribed PRN metoprolol 25mg  for breakthrough episodes.   She underwent successful mTEER with MitraClip XTW x2 at A2/P2 reducing MR from 4+ to trace on post procedure echo on 01/03/24. Started on DAPT  with ASA 81mg  and Plavix 75mg . Echo 01/04/24 with normal LV function at 60-65% with mild concentric LVH, severe LA dilation, trivial AR, and trivial MR s/p MitraClip XTW x2 in the A2/P2 medical and lateral A2/P2 positions with a mean gradient at . Unfortunately her hospitalization was complicated by acute CVA after reporting acute left eye monocular blindness 01/04/24. CT performed with no acute intracranial abnormalities. No code stroke called given brief duration (20 sec with complete resolution). Neurology consulted with recommendations for MRI brain and CTA head/neck. MRI 1/23 showed scattered punctate acute infarcts in multiple vascular territories, including the left cerebellum, right occipital lobe, left parietal lobe, and right frontal lobe felt secondary to a central embolic etiology. Neurology felt peri-procedure related however workup to r/o other etiologies. CTA head with no large vessel occlusion or other emergent finding. Seen by PT/OT/SLP with no further follow up needed as patient felt to be back to baseline. No AF on telemetry. Neurology final recommendations for DAPT for 3 weeks then can transition to ASA monotherapy although patient will stay on DAPT for at least 6 months with MitraClip placement then will transition to ASA.   Today she is here with a friend as is doing exceptional. No further neuro changes. No chest pain, SOB, palpitations, LE edema, orthopnea, PND, dizziness, or syncope. Denies bleeding in stool or urine.    Physical Exam:   VS:  BP 130/82   Pulse 79   Ht 5\' 7"  (1.702 m)   Wt 166 lb (75.3 kg)   SpO2 98%  BMI 26.00 kg/m    Wt Readings from Last 3 Encounters:  01/08/24 166 lb (75.3 kg)  01/03/24 172 lb 2.9 oz (78.1 kg)  12/26/23 166 lb 9.6 oz (75.6 kg)    General: Well developed, well nourished, NAD Lungs:Clear to ausculation bilaterally. No wheezes, rales, or rhonchi. Breathing is unlabored. Cardiovascular: RRR with S1 S2. No murmurs Extremities: No edema.  No clubbing or cyanosis. DP/PT pulses 2+ bilaterally Neuro: Alert and oriented. No focal deficits. No facial asymmetry. MAE spontaneously. Psych: Responds to questions appropriately with normal affect.    ASSESSMENT AND PLAN: .    Severe mitral regurgitation: with posterior leaflet prolapse, ruptured chord, and flail P2 segment on pre-op TEE, she is now s/p successful MitraClip XTW x2 at A2/P2 reducing MR from 4+ to trace on post procedure echo on 01/03/24. Started on DAPT with ASA 81mg  and Plavix 75mg . Echo 01/04/24 with normal LV function at 60-65% with mild concentric LVH, severe LA dilation, trivial AR, and trivial MR s/p MitraClip XTW x2 in the A2/P2 medical and lateral A2/P2 positions with a mean gradient at . Will require lifelong dental SBE with amoxicillin which was RXd today. Plan one month OV with repeat echocardiogram. Doing well with NYHA class I symptoms.   Acute stroke: Patient reported acute left eye monocular blindness 01/04/24. CT performed with no acute intracranial abnormalities. No code stroke called given brief duration (20 sec with complete resolution). Neurology consulted with recommendations for MRI brain and CTA head/neck. MRI 1/23 showed scattered punctate acute infarcts in multiple vascular territories, including the left cerebellum, right occipital lobe, left parietal lobe, and right frontal lobe felt secondary to a central embolic etiology. Neurology felt peri-procedure related however workup to r/o other etiologies. CTA head with no large vessel occlusion or other emergent finding. Neurology final recommendations for DAPT for 3 weeks then can transition to ASA monotherapy although patient will stay on DAPT for at least 6 months with MitraClip placement then will transition to ASA. Obtain 30-day monitor to fully evaluate for AF.    SVT/Sinus tachycardia/atrial tachycardia: Recently wore a 5 day monitor which showed  NSR with 71 runs of SVT with intermittent possible atrial  tachycardia episodes. Previously RXd PRN metoprolol however started on Toprol XL 12.5mg  during her hospital course. Given prior gastric bypass, will change this to metoprolol 25mg  BID. 30 day monitored ordered. Follow results.    HTN: Stable. Continue home enalapril and transition to metoprolol 25mg  BID.    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 Referral placed 01/05/24. Discussed with patient today with all questions answered.      Cardiac Rehabilitation Eligibility Assessment        I spent 20 minutes caring for this patient today including face-to-face discussions, ordering and reviewing labs, reviewing records from [facility] and other outside facilities, documenting in the record, and arranging for follow up.    Signed, Georgie Chard, NP

## 2024-01-10 ENCOUNTER — Telehealth: Payer: Self-pay | Admitting: Internal Medicine

## 2024-01-10 MED ORDER — METOPROLOL TARTRATE 25 MG PO TABS: 25.0000 mg | ORAL_TABLET | Freq: Two times a day (BID) | ORAL | Status: AC | PRN

## 2024-01-10 NOTE — Op Note (Signed)
PROCEDURE:  Transcatheter edge to edge mitral valve repair (TEER) INDICATION: Severe symptomatic mitral regurgitation (Stage D)  SURGEON:  Alverda Skeans, MD  CO-SURGEON: Tonny Bollman, MD   PROCEDURAL DETAILS: General anesthesia is induced.  The patient is prepped and draped.  Baseline transesophageal echo images are obtained and confirm appropriate anatomy for transcatheter edge-to-edge mitral valve repair.  Using vascular ultrasound guidance, the right common femoral vein is accessed via a front wall puncture.  2 Perclose sutures are deployed and an 8 [place] sheath is inserted.  A versa cross wire is advanced into the SVC.  Heparin is administered and a therapeutic ACT is achieved.  Transseptal puncture is performed over the mid posterior portion of the fossa.  Transseptal puncture is very difficult and multiple curves are placed on the transseptal dilator in order to facilitate appropriate position on the septum.  The septum is dilated while the MitraClip steerable guide catheter is prepped.  After progressively dilating the right common femoral vein, the 24 French MitraClip steerable guide catheter is inserted and advanced across the interatrial septum.  The dilator and the versa cross wire were carefully removed and the guide tip is appropriately positioned approximately 3 cm across the interatrial septum.  A MitraClip XTW device is prepped per protocol.  The device is inserted through the steerable guide catheter with caution taken to avoid air entrapment.  The MitraClip device is positioned above the mitral valve after applying appropriate curve to the guide catheter.  The MitraClip device is then oriented coaxially with the anterior and posterior leaflets of the mitral valve.  Low tidal volume ventilation is initiated.  The clip device is advanced across the mitral valve and pulled back until capture of both the anterior and posterior leaflets is achieved.  The clip is positioned over the flail  segment of A2/P2.  Grippers are dropped and the clip is closed under TEE guidance.  Careful TEE assessment is performed and reduction in mitral valve regurgitation is felt to be appropriate.  Leaflet insertion is verified using 3D imaging.  The MitraClip device is deployed using normal technique and the clip delivery system is removed.  After full TEE assessment, there is good reduction in MR, but there is residual leaflet mobility and 1-2+ residual MR lateral to the first clip.  A second clip is placed after prepping another XTW device.  The clip is advanced through the steerable guide catheter and again is appropriately oriented on the atrial side of the mitral valve.  The clip is advanced across the mitral valve and closed position after assessing trajectory in relationship to the first clip.  Caution is taken to avoid any interaction with the first clip.  The XTW device is advanced and demonstrated to have good leaflet capture just lateral to the first clip.  Grippers are dropped and the device is closed.  There is excellent reduction in mitral regurgitation.  There is an acceptable transmitral mean gradient of 5 mmHg.  The clip is deployed using normal technique.    PROCEDURE COMPLETION: The steerable guide catheter is pulled back into the right atrium and the interatrial septum is assessed with TEE.  There is no significant septal injury seen and no right to left shunting.  The guide catheter is removed and the Perclose sutures are tightened.  Protamine is administered.  CONCLUSION: Successful transcatheter edge-to-edge mitral valve repair under fluoroscopic and echo guidance, reducing baseline 4+ mitral regurgitation to trace, clip positioned A2/P2.  Tonny Bollman 01/10/2024 5:35 PM

## 2024-01-10 NOTE — Telephone Encounter (Signed)
Discussed w/ Georgie Chard, who saw pt 2 days ago in the office. Called pt and advised to stop Lopressor daily and to take BID PRN going forward. Advised to call us back if no improvement in symptoms/HRs or if tachycardia begins after she stops the medication. Patient verbalized understanding and agreeable to plan.

## 2024-01-10 NOTE — Telephone Encounter (Signed)
STAT if HR is under 50 or over 120 (normal HR is 60-100 beats per minute)  What is your heart rate? 75 - right now   Do you have a log of your heart rate readings (document readings)?  Last night it was in the high 30's, she recorded it on both her apple watch and BP cuff and they were the same  Do you have any other symptoms? No

## 2024-01-12 DIAGNOSIS — Z85850 Personal history of malignant neoplasm of thyroid: Secondary | ICD-10-CM | POA: Diagnosis not present

## 2024-01-12 DIAGNOSIS — I6310 Cerebral infarction due to embolism of unspecified precerebral artery: Secondary | ICD-10-CM | POA: Diagnosis not present

## 2024-01-12 DIAGNOSIS — I639 Cerebral infarction, unspecified: Secondary | ICD-10-CM

## 2024-01-12 DIAGNOSIS — E890 Postprocedural hypothyroidism: Secondary | ICD-10-CM | POA: Diagnosis not present

## 2024-01-12 DIAGNOSIS — R002 Palpitations: Secondary | ICD-10-CM

## 2024-01-17 ENCOUNTER — Encounter: Payer: Self-pay | Admitting: Urology

## 2024-01-17 ENCOUNTER — Ambulatory Visit: Payer: Medicare HMO | Admitting: Urology

## 2024-01-17 ENCOUNTER — Telehealth (HOSPITAL_COMMUNITY): Payer: Self-pay

## 2024-01-17 VITALS — BP 147/85 | HR 73

## 2024-01-17 DIAGNOSIS — N3281 Overactive bladder: Secondary | ICD-10-CM

## 2024-01-17 DIAGNOSIS — N3941 Urge incontinence: Secondary | ICD-10-CM

## 2024-01-17 LAB — URINALYSIS, ROUTINE W REFLEX MICROSCOPIC
Bilirubin, UA: NEGATIVE
Glucose, UA: NEGATIVE
Ketones, UA: NEGATIVE
Nitrite, UA: NEGATIVE
Protein,UA: NEGATIVE
RBC, UA: NEGATIVE
Specific Gravity, UA: 1.025 (ref 1.005–1.030)
Urobilinogen, Ur: 0.2 mg/dL (ref 0.2–1.0)
pH, UA: 5.5 (ref 5.0–7.5)

## 2024-01-17 LAB — MICROSCOPIC EXAMINATION: Bacteria, UA: NONE SEEN

## 2024-01-17 LAB — BLADDER SCAN AMB NON-IMAGING: Scan Result: 0

## 2024-01-17 NOTE — Telephone Encounter (Signed)
 Called patient regarding referral to cardiac rehab. She stated she felt great and she did not feel like she needed the program at this time and did not want to pay the $40 co-payment. Informed her to call back if she changed her mind. Cancelling referral.

## 2024-01-17 NOTE — Progress Notes (Signed)
 PVR 0

## 2024-01-28 ENCOUNTER — Telehealth: Payer: Self-pay | Admitting: Physician Assistant

## 2024-01-28 NOTE — Telephone Encounter (Signed)
 [employer] recorded and alarm this a.m.  The patient had gone into rapid atrial fibrillation, heart rate in the 130s.  The atrial fibrillation only lasted about 20 minutes, and resolved spontaneously.  No patient symptoms are reported.  At the time of my call, the patient was in sinus rhythm with a heart rate of 67.  I will forward this to Ms. Julien Girt.  Theodore Demark, PA-C 01/28/2024 9:13 AM

## 2024-01-29 ENCOUNTER — Other Ambulatory Visit: Payer: Self-pay | Admitting: Cardiology

## 2024-01-29 MED ORDER — APIXABAN 5 MG PO TABS: 5.0000 mg | ORAL_TABLET | Freq: Two times a day (BID) | ORAL | 2 refills | Status: DC

## 2024-01-29 NOTE — Telephone Encounter (Signed)
 Attempted to call to speak with the patient regarding AF on 30 day monitor. She has been on DAPT with ASA and Plavix since hospital discharge. With c/o of prior palpitations and the events which followed TEER with stroke, my inclination would be to transition the patient to anticoagulation despite a brief episode of AF. I was unable to reach the patient to discuss. Will forward note to Dr. Lynnette Caffey for his recommendation as well.   Georgie Chard NP-C

## 2024-01-29 NOTE — Telephone Encounter (Signed)
 Discussed stopping DAPT with ASA and Plavix and starting Eliquis 5mg  BID given new AF on monitor and recent post TEER scattered punctate acute infarcts in multiple vascular territories, including the left cerebellum, right occipital lobe, left parietal lobe, and right frontal lobe felt secondary to a central embolic etiology. Patient has close follow up 02/07/24 with repeat echo at which time we will plan to obtain CBC and discuss rate control strategies.

## 2024-02-05 ENCOUNTER — Ambulatory Visit: Payer: Medicare HMO | Admitting: Internal Medicine

## 2024-02-06 ENCOUNTER — Encounter (INDEPENDENT_AMBULATORY_CARE_PROVIDER_SITE_OTHER): Payer: Self-pay | Admitting: Otolaryngology

## 2024-02-07 ENCOUNTER — Ambulatory Visit: Payer: Medicare HMO | Attending: Cardiology | Admitting: Physician Assistant

## 2024-02-07 ENCOUNTER — Ambulatory Visit (HOSPITAL_BASED_OUTPATIENT_CLINIC_OR_DEPARTMENT_OTHER): Payer: Medicare HMO

## 2024-02-07 ENCOUNTER — Encounter (HOSPITAL_COMMUNITY): Payer: Self-pay

## 2024-02-07 ENCOUNTER — Encounter: Payer: Self-pay | Admitting: Internal Medicine

## 2024-02-07 ENCOUNTER — Other Ambulatory Visit (HOSPITAL_COMMUNITY): Payer: Self-pay | Admitting: Physician Assistant

## 2024-02-07 VITALS — BP 130/80 | HR 63 | Ht 67.0 in | Wt 169.0 lb

## 2024-02-07 DIAGNOSIS — I480 Paroxysmal atrial fibrillation: Secondary | ICD-10-CM | POA: Insufficient documentation

## 2024-02-07 DIAGNOSIS — I340 Nonrheumatic mitral (valve) insufficiency: Secondary | ICD-10-CM

## 2024-02-07 DIAGNOSIS — Z9889 Other specified postprocedural states: Secondary | ICD-10-CM | POA: Insufficient documentation

## 2024-02-07 DIAGNOSIS — I6310 Cerebral infarction due to embolism of unspecified precerebral artery: Secondary | ICD-10-CM | POA: Insufficient documentation

## 2024-02-07 DIAGNOSIS — I10 Essential (primary) hypertension: Secondary | ICD-10-CM | POA: Diagnosis not present

## 2024-02-07 DIAGNOSIS — Z95818 Presence of other cardiac implants and grafts: Secondary | ICD-10-CM

## 2024-02-07 DIAGNOSIS — K118 Other diseases of salivary glands: Secondary | ICD-10-CM | POA: Insufficient documentation

## 2024-02-07 DIAGNOSIS — Z79899 Other long term (current) drug therapy: Secondary | ICD-10-CM | POA: Diagnosis not present

## 2024-02-07 LAB — ECHOCARDIOGRAM COMPLETE
Area-P 1/2: 2.17 cm2
MV M vel: 5.93 m/s
MV Peak grad: 140.7 mmHg
MV VTI: 2.55 cm2
P 1/2 time: 549 ms
S' Lateral: 2.8 cm

## 2024-02-07 NOTE — Patient Instructions (Addendum)
-[  Medication Instructions:  The current medical regimen is effective;  continue present plan and medications.  *If you need a refill on your cardiac medications before your next appointment, please call your pharmacy*   Lab Work: Please have blood work today at [facility] (CBC, BMP)  If you have labs (blood work) drawn today and your tests are completely normal, you will receive your results only by: MyChart Message (if you have MyChart) OR A paper copy in the mail If you have any lab test that is abnormal or we need to change your treatment, we will call you to review the results.   Testing/Procedures: Your physician has requested that you have an echocardiogram in 1 year. Echocardiography is a painless test that uses sound waves to create images of your heart. It provides your doctor with information about the size and shape of your heart and how well your heart's chambers and valves are working. This procedure takes approximately one hour. There are no restrictions for this procedure. Please do NOT wear cologne, perfume, aftershave, or lotions (deodorant is allowed). Please arrive 15 minutes prior to your appointment time.  Please note: We ask at that you not bring children with you during ultrasound (echo/ vascular) testing. Due to room size and safety concerns, children are not allowed in the ultrasound rooms during exams. Our front office staff cannot provide observation of children in our lobby area while testing is being conducted. An adult accompanying a patient to their appointment will only be allowed in the ultrasound room at the discretion of the ultrasound technician under special circumstances. We apologize for any inconvenience.   Follow-Up: At [facility], you and your health needs are our priority.  As part of our continuing mission to provide you with exceptional heart care, we have created designated Provider Care Teams.  These Care Teams include your primary  Cardiologist (physician) and Advanced Practice Providers (APPs -  Physician Assistants and Nurse Practitioners) who all work together to provide you with the care you need, when you need it.  We recommend signing up for the patient portal called "MyChart".  Sign up information is provided on this After Visit Summary.  MyChart is used to connect with patients for Virtual Visits (Telemedicine).  Patients are able to view lab/test results, encounter notes, upcoming appointments, etc.  Non-urgent messages can be sent to your provider as well.   To learn more about what you can do with MyChart, go to [email or web address].    Your next appointment:   1 year(s)  Provider:   Cline Crock, PA           1st Floor: - Lobby - Registration  - Pharmacy  - Lab - Cafe  2nd Floor: - PV Lab - Diagnostic Testing (echo, CT, nuclear med)  3rd Floor: - Vacant  4th Floor: - TCTS (cardiothoracic surgery) - AFib Clinic - Structural Heart Clinic - Vascular Surgery  - Vascular Ultrasound  5th Floor: - HeartCare Cardiology (general and EP) - Clinical Pharmacy for coumadin, hypertension, lipid, weight-loss medications, and med management appointments    Valet parking services will be available as well.

## 2024-02-07 NOTE — Progress Notes (Addendum)
 [facility]   MULTIDISCIPLINARY HEART VALVE CLINIC                                     Cardiology Office Note:    Date:  02/07/2024   ID:  [name], DOB 1946/07/09, MRN [id number]  PCP:  Tommie Sams, DO  [facility] Cardiologist:  Orbie Pyo, MD  [facility] Electrophysiologist:  None   Referring MD: Tommie Sams, DO   1 month s/p mTEER  History of Present Illness:    [name] is a 78 y.o. 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   TEE 10/27/23 showed severe MR with posterior leaflet prolapse with ruptured chord and flail P2 segment. [facility] 12/06/23 showed no obstructive CAD. She reported subjective palpitations and ZIO monitor in 12/2023 showed predominantly NSR with episodes of SVT versus atrial tachycardia. She underwent successful mTEER with MitraClip XTW x2 at A2/P2 reducing MR from 4+ to trace on 01/03/24. She was started on DAPT with ASA 81mg  and Plavix 75mg . POD1 echo showed normal LV function at 60-65% with mild concentric LVH, severe LA dilation, trivial AR, and trivial MR with a mean gradient at . Hospitalization was complicated by acute CVA with acute left eye monocular blindness 01/04/24. This self resolved after 20 minutes. CT/CTA was negative but MRI 1/23 showed scattered punctate acute infarcts in multiple vascular territories, including the left cerebellum, right occipital lobe, left parietal lobe, and right frontal lobe felt secondary to a central embolic etiology. She was seen in follow up and doing excellent. 30 day monitor placed given recent CVA. This showed rapid afib on 01/29/24 and DAPT was discontinued and started on Eliquis 5mg  BID.   Today she presents to clinic for follow up. Hasn't felt badly but just has felt lazy with no "get up and go." Occasional swelling of legs but none today.  No CP or SOB. No orthopnea or PND. No dizziness or syncope. No blood in stool or urine. No palpitations. Previously followed by Dr. Tenny Craw but wants to switch care to Dr. Lynnette Caffey.    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 Lynnette Caffey   Severe mitral insufficiency    Tachycardia      Current Medications: Current Meds  Medication Sig   amoxicillin (AMOXIL) 500 MG tablet TAKE 4 TABLETS BY MOUTH 1 HOUR PRIOR TO DENTAL PROCEDURES   apixaban (ELIQUIS) 5 MG TABS tablet Take 1 tablet (5 mg total) by mouth 2 (two) times daily.   Biotin 5000 MCG CAPS Take 5,000 mcg by mouth daily.   Calcium Citrate-Vitamin D (CALCIUM CITRATE+D3 PO) Take 1 tablet by mouth daily.   carboxymethylcellulose (REFRESH PLUS)  0.5 % SOLN Place 1 drop into both eyes 3 (three) times daily as needed (Dry eye).   Cholecalciferol (VITAMIN D3 ULTRA STRENGTH) 125 MCG (5000 UT) capsule Take 5,000 Units by mouth daily.   Cyanocobalamin 5000 MCG TBDP Take 5,000 mcg by mouth daily.   enalapril (VASOTEC) 10 MG tablet Take 1 tablet (10 mg total) by mouth daily.   ferrous sulfate 325 (65 FE) MG tablet Take 325 mg by mouth daily.   levothyroxine (SYNTHROID) 150 MCG tablet Take 1 tablet (150 mcg total) by mouth daily before breakfast. (Patient taking differently: Take 150 mcg by mouth See admin instructions. *MUST TAKE BRAND* Take 150 mg daily except skip dose on Sundays)   Magnesium 250 MG CAPS Take 250 mg by mouth daily.   metoprolol tartrate (LOPRESSOR) 25 MG tablet  Take 1 tablet (25 mg total) by mouth 2 (two) times daily as needed.   Multiple Vitamin (MULTIVITAMIN WITH MINERALS) TABS tablet Take 1 tablet by mouth in the morning and at bedtime. Woman 50+   pantoprazole (PROTONIX) 40 MG tablet Take 1 tablet (40 mg total) by mouth daily.   promethazine-dextromethorphan (PROMETHAZINE-DM) 6.25-15 MG/5ML syrup Take 5 mLs by mouth 4 (four) times daily as needed for cough.   rosuvastatin (CRESTOR) 20 MG tablet Take 1 tablet (20 mg total) by mouth daily.   valACYclovir (VALTREX) 1000 MG tablet TAKE 2 TABLETS AT FIRST SIGN OF FEVER BLISTER, THEN 2 TABLETS 2 HOURSLATER   Zinc 50 MG TABS Take 50 mg by mouth daily.      ROS:   Please see the history of present illness.    All other systems reviewed and are negative.  EKGs       Risk Assessment/Calculations:      CHA2DS2-VASc Score = 7   This indicates a 11.2% annual risk of stroke. The patient's score is based upon: CHF History: 1 HTN History: 1 Diabetes History: 0 Stroke History: 2 Vascular Disease History: 0 Age Score: 2 Gender Score: 1       Physical Exam:    VS:  BP 130/80   Pulse 63   Ht 5\' 7"  (1.702 m)   Wt 169 lb (76.7 kg)   SpO2 98%   BMI 26.47 kg/m     Wt Readings from Last 3 Encounters:  02/07/24 169 lb (76.7 kg)  01/08/24 166 lb (75.3 kg)  01/03/24 172 lb 2.9 oz (78.1 kg)     GEN: Well nourished, well developed in no acute distress NECK: No JVD, marble sized mass behind jaw, below the ear. CARDIAC: RRR, 2/6 holosystolic murmur @ apex. No rubs, gallops RESPIRATORY:  Clear to auscultation without rales, wheezing or rhonchi  ABDOMEN: Soft, non-tender, non-distended EXTREMITIES:  No edema; No deformity.    ASSESSMENT:    1. S/P mitral valve clip implantation   2. PAF (paroxysmal atrial fibrillation) (HCC)   3. Cerebrovascular accident (CVA) due to embolism of precerebral artery (HCC)   4. Essential hypertension, benign   5. Parotid mass     PLAN:    In order of  problems listed above:  Severe MR s/p mTEER with MitraClip XTW x2:  -- Echo today shows EF 65-70% with mild residual MR with a mean gradient of 4mm hg at HR 61bpm. -- NYHA class I symptoms -- Continue Eliquis 5mg  BID given recent finding of afib on 30 day monitor.  -- Will require lifelong dental SBE with amoxicillin. -- Plan one year OV with repeat echocardiogram.   PAF:  --  Noted on 30 day monitor after post op CVA. -- CHADSVASC of 7 and started on Eliquis 5mg  BID. Pt assistance forms filled out today.  -- Cannot take Toprol XL (not able to take extended release tablets with gastric bypass.) -- Reported bradycardia with scheduled Lopressor. Continue Lopressor 25mg  BID PRN for afib.   -- Check BMET and CBC today  CVA:   -- Felt to be cardioembolic in nature.  -- PAF noted on monitor and now on Eliquis.  -- Continue Crestor 5mg  daily.    HTN:  -- BP well controlled.  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  -- She does note a small hard mass behind her right jaw below the ear, which I was able to palpate.  -- Seeing ENT 4/30   Medication Adjustments/Labs and Tests Ordered: Current medicines are reviewed at length with the patient today.  Concerns regarding medicines are outlined above.  Orders Placed This Encounter  Procedures   CBC   Basic metabolic panel   ECHOCARDIOGRAM COMPLETE   No orders of the defined types were placed in this encounter.   Patient Instructions  -[Medication Instructions:  The current medical regimen is effective;  continue present plan and medications.  *If you need a refill on your cardiac medications before your next appointment, please call your pharmacy*   Lab Work: Please have blood work today at [facility] (CBC, BMP)  If you have labs (blood work) drawn today and your tests are completely normal, you  will receive your results only by: MyChart Message (if you have MyChart) OR A paper copy in the mail If you have any lab test that is abnormal or we need to change your treatment, we will call you to review the results.   Testing/Procedures: Your physician has requested that you have an echocardiogram in 1 year. Echocardiography is a painless test that uses sound waves to create images of your heart. It provides your doctor with information about the size and shape of your heart and how well your heart's chambers and valves are working. This procedure takes approximately one hour. There are no restrictions for this procedure. Please do NOT wear cologne, perfume, aftershave, or lotions (deodorant is allowed). Please arrive 15 minutes prior to your appointment time.  Please note: We ask at that you not bring children with you during ultrasound (echo/ vascular) testing. Due to room size and safety concerns, children are not allowed in the ultrasound rooms during exams. Our front office staff cannot provide observation of children in our lobby area while testing is being conducted. An adult accompanying a patient to their appointment will only be allowed in the ultrasound room at the discretion of the ultrasound technician under special circumstances. We apologize for any inconvenience.   Follow-Up: At [facility], you and your health needs are our priority.  As part of our continuing mission to provide you with exceptional heart care, we have created designated Provider Care Teams.  These Care Teams include your primary Cardiologist (physician) and Advanced Practice Providers (APPs -  Physician Assistants and Nurse Practitioners) who all work together to provide you with the care you need, when you need it.  We recommend signing up for the patient portal called "MyChart".  Sign up information is provided on this After Visit Summary.  MyChart is used to connect with patients for  Virtual Visits  (Telemedicine).  Patients are able to view lab/test results, encounter notes, upcoming appointments, etc.  Non-urgent messages can be sent to your provider as well.   To learn more about what you can do with MyChart, go to [email or web address].    Your next appointment:   1 year(s)  Provider:   Cline Crock, PA           1st Floor: - Lobby - Registration  - Pharmacy  - Lab - Cafe  2nd Floor: - PV Lab - Diagnostic Testing (echo, CT, nuclear med)  3rd Floor: - Vacant  4th Floor: - TCTS (cardiothoracic surgery) - AFib Clinic - Structural Heart Clinic - Vascular Surgery  - Vascular Ultrasound  5th Floor: - HeartCare Cardiology (general and EP) - Clinical Pharmacy for coumadin, hypertension, lipid, weight-loss medications, and med management appointments    Valet parking services will be available as well.      Signed, Cline Crock, PA-C  02/07/2024 5:29 PM    [facility]

## 2024-02-08 LAB — BASIC METABOLIC PANEL
BUN/Creatinine Ratio: 18 (ref 12–28)
BUN: 14 mg/dL (ref 8–27)
CO2: 21 mmol/L (ref 20–29)
Calcium: 9.2 mg/dL (ref 8.7–10.3)
Chloride: 109 mmol/L — ABNORMAL HIGH (ref 96–106)
Creatinine, Ser: 0.79 mg/dL (ref 0.57–1.00)
Glucose: 83 mg/dL (ref 70–99)
Potassium: 4.4 mmol/L (ref 3.5–5.2)
Sodium: 143 mmol/L (ref 134–144)
eGFR: 77 mL/min/{1.73_m2} (ref >59)

## 2024-02-08 LAB — CBC
Hematocrit: 38.1 % (ref 34.0–46.6)
Hemoglobin: 12.6 g/dL (ref 11.1–15.9)
MCH: 30.9 pg (ref 26.6–33.0)
MCHC: 33.1 g/dL (ref 31.5–35.7)
MCV: 93 fL (ref 79–97)
Platelets: 179 10*3/uL (ref 150–450)
RBC: 4.08 x10E6/uL (ref 3.77–5.28)
RDW: 13.1 % (ref 11.7–15.4)
WBC: 6.4 10*3/uL (ref 3.4–10.8)

## 2024-02-14 ENCOUNTER — Telehealth: Payer: Self-pay

## 2024-02-14 ENCOUNTER — Other Ambulatory Visit (HOSPITAL_COMMUNITY): Payer: Self-pay

## 2024-02-14 NOTE — Telephone Encounter (Signed)
 App received on fax. Missing OOP 3% spending and POI. Reaching out to patient via mychart.

## 2024-02-15 ENCOUNTER — Ambulatory Visit: Attending: Cardiology

## 2024-02-15 DIAGNOSIS — I639 Cerebral infarction, unspecified: Secondary | ICD-10-CM

## 2024-02-15 DIAGNOSIS — I6310 Cerebral infarction due to embolism of unspecified precerebral artery: Secondary | ICD-10-CM

## 2024-02-15 DIAGNOSIS — R002 Palpitations: Secondary | ICD-10-CM

## 2024-02-16 ENCOUNTER — Encounter (HOSPITAL_COMMUNITY): Payer: Self-pay | Admitting: Internal Medicine

## 2024-02-26 NOTE — Telephone Encounter (Signed)
 Mailed letter requesting missing info.

## 2024-02-28 ENCOUNTER — Telehealth: Payer: Self-pay | Admitting: *Deleted

## 2024-02-28 ENCOUNTER — Other Ambulatory Visit: Payer: Self-pay

## 2024-02-28 ENCOUNTER — Ambulatory Visit (INDEPENDENT_AMBULATORY_CARE_PROVIDER_SITE_OTHER): Payer: Medicare HMO | Admitting: Urology

## 2024-02-28 VITALS — BP 147/86 | HR 62

## 2024-02-28 DIAGNOSIS — N3281 Overactive bladder: Secondary | ICD-10-CM

## 2024-02-28 MED ORDER — SULFAMETHOXAZOLE-TRIMETHOPRIM 800-160 MG PO TABS: ORAL_TABLET | ORAL | 0 refills | Status: DC

## 2024-02-28 NOTE — Telephone Encounter (Signed)
   Pre-operative Risk Assessment    Patient Name: [name]  DOB: Jun 03, 1946 MRN: [id number]   Date of last office visit: 02/07/24 Cline Crock, [facility] Date of next office visit: 06/06/24 DR. [facility]   Request for Surgical Clearance    Procedure:   CYSTOSCOPY  WITH BOTOX  Date of Surgery:  Clearance 04/17/24                                Surgeon:  DR. Ronne Binning Surgeon's Group or Practice Name:  [facility] [place] Phone number:  [phone] Fax number:  [phone]   Type of Clearance Requested:   - Medical  - Pharmacy:  Hold Apixaban (Eliquis) PER DR. Ronne Binning PT TO BE BRIDGED WITH LOVENOX   Type of Anesthesia:  Not Indicated   Additional requests/questions:    Elpidio Anis   02/28/2024, 5:35 PM

## 2024-02-29 NOTE — Telephone Encounter (Signed)
 Patient with diagnosis of MR plus CVA on Eliquis for anticoagulation.    Procedure: CYSTOSCOPY  WITH BOTOX  Date of procedure: 04/17/24   CHA2DS2-VASc Score = 7  This indicates a 11.2% annual risk of stroke. The patient's score is based upon: CHF History: 1 HTN History: 1 Diabetes History: 0 Stroke History: 2 Vascular Disease History: 0 Age Score: 2 Gender Score: 1   CrCl 71 ml/min Platelet count 179K  Patient had mitral valve clip implanted on 01/03/24 and developed acute CVA afterwards. Will route to Dr Lynnette Caffey to see if patient can hold Eliqius and if so, if Lovenox bridge is needed   **This guidance is not considered finalized until pre-operative APP has relayed final recommendations.**

## 2024-03-04 NOTE — Telephone Encounter (Signed)
 [name],  Patient's chart was reviewed for preoperative cardiac evaluation. She was seen by you on 02/07/2024. Could you comment on cardiac risk for upcoming procedure since office visit was less than 2 months ago.    Please route your response to p cv div preop.  Thank you, Levi Aland, NP-C 03/04/2024, 7:43 AM

## 2024-03-05 MED ORDER — LIDOCAINE HCL 2 % IJ SOLN: 10.0000 mL | Freq: Once | INTRAMUSCULAR | Status: AC

## 2024-03-05 MED ORDER — CIPROFLOXACIN HCL 500 MG PO TABS: 500.0000 mg | ORAL_TABLET | Freq: Once | ORAL | Status: AC

## 2024-03-05 MED ORDER — ONABOTULINUMTOXINA 100 UNITS IJ SOLR: 200.0000 [IU] | Freq: Once | INTRAMUSCULAR | Status: AC

## 2024-03-05 NOTE — Progress Notes (Signed)
 Appointment cnacelled

## 2024-03-05 NOTE — Telephone Encounter (Signed)
   Name: [name]  DOB: Sep 09, 1946  MRN: [id number]   Primary Cardiologist: Orbie Pyo, MD  Chart reviewed as part of pre-operative protocol coverage. [name] was last seen on 02/07/2024 by Carlean Jews, PA-C.  Per Florentina [name] "She is cleared to proceed without further testing and can hold Eliquis with a Lovenox bridge as planned."   Therefore, based on ACC/AHA guidelines, the patient would be an acceptable risk for the planned procedure without further cardiovascular testing.   Per Pharm D and Dr. Lynnette Caffey, patient will hold Eliquis and bridge with Lovenox around procedure. Arrangements will be made for this through [facility].     I will route this recommendation to the requesting party via Epic fax function and remove from pre-op pool. Please call with questions.  Carlos Levering, NP 03/05/2024, 9:10 AM

## 2024-03-19 ENCOUNTER — Ambulatory Visit: Payer: Medicare HMO | Admitting: Family Medicine

## 2024-03-19 DIAGNOSIS — K118 Other diseases of salivary glands: Secondary | ICD-10-CM | POA: Diagnosis not present

## 2024-03-19 DIAGNOSIS — I10 Essential (primary) hypertension: Secondary | ICD-10-CM

## 2024-03-19 DIAGNOSIS — E785 Hyperlipidemia, unspecified: Secondary | ICD-10-CM

## 2024-03-19 DIAGNOSIS — I6930 Unspecified sequelae of cerebral infarction: Secondary | ICD-10-CM

## 2024-03-19 DIAGNOSIS — I6310 Cerebral infarction due to embolism of unspecified precerebral artery: Secondary | ICD-10-CM

## 2024-03-19 MED ORDER — ENALAPRIL MALEATE 10 MG PO TABS: 10.0000 mg | ORAL_TABLET | Freq: Every day | ORAL | 3 refills | Status: AC

## 2024-03-19 NOTE — Progress Notes (Signed)
 Subjective:  Patient ID: [name], female    DOB: July 22, 1946  Age: 78 y.o. MRN: [id number]  CC:   Chief Complaint  Patient presents with   Follow-up    6 month f/u     HPI:  78 year old female who is recently status post mitral valve repair presents for follow-up.  Patient had perioperative acute infarcts.  She does not have any lasting deficits.  Workup for stroke revealed incidental parotid mass (right). Has upcoming appt with ENT. Additionally, outpatient 30 day monitor revealed Afib and now she is on Eliquis.  Overall, she states that she is doing well. No chest pain or SOB.  BP well controlled.  Last LDL 92. Currently on Crestor 20 mg daily.   Patient Active Problem List   Diagnosis Date Noted   CVA (cerebral vascular accident) (HCC) 01/05/2024   Parotid mass 01/05/2024   OAB (overactive bladder) 01/04/2024   S/P mitral valve clip implantation 01/03/2024   Status post total replacement of left hip 11/21/2022   GERD (gastroesophageal reflux disease) 05/11/2022   Urge incontinence 02/21/2022   B12 deficiency 11/10/2021   Vitamin D deficiency 11/10/2021   Status post total hip replacement, right 12/22/2020   History of gastric bypass 11/02/2020   History of thyroid cancer 10/22/2020   Hypothyroidism, 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   Essential hypertension, benign 09/09/2013   Hyperlipidemia 09/09/2013    Social Hx   Social History   Socioeconomic History   Marital status: Widowed    Spouse name: Not on file   Number of children: Not on file   Years of education: Not on file   Highest education level: 12th grade  Occupational History   Occupation: Retired Clinical biochemist  Tobacco Use   Smoking status: Never   Smokeless tobacco: Never  Vaping Use   Vaping status: Never Used  Substance and Sexual Activity   Alcohol use: No   Drug use: No   Sexual activity: Not on file  Other Topics Concern   Not on file  Social History Narrative   Not on file    Social Drivers of Health   Financial Resource Strain: Low Risk  (03/15/2024)   Overall Financial Resource Strain (CARDIA)    Difficulty of Paying Living Expenses: Not hard at all  Food Insecurity: No Food Insecurity (03/15/2024)   Hunger Vital Sign    Worried About Running Out of Food in the Last Year: Never true    Ran Out of Food in the Last Year: Never true  Transportation Needs: No Transportation Needs (03/15/2024)   PRAPARE - Administrator, Civil Service (Medical): No    Lack of Transportation (Non-Medical): No  Physical Activity: Unknown (03/15/2024)   Exercise Vital Sign    Days of Exercise per Week: 2 days    Minutes of Exercise per Session: Patient declined  Stress: No Stress Concern Present (03/15/2024)   [employer] of Occupational Health - Occupational Stress Questionnaire    Feeling of Stress : Not at all  Social Connections: Moderately Integrated (03/15/2024)   Social Connection and Isolation Panel [NHANES]    Frequency of Communication with Friends and Family: More than three times a week    Frequency of Social Gatherings with Friends and Family: Three times a week    Attends Religious Services: More than 4 times per year    Active Member of Clubs or Organizations: Yes    Attends Banker Meetings: More than 4 times per year  Marital Status: Widowed    Review of Systems Per HPI  Objective:  BP 132/78   Pulse 64   Temp (!) 97.2 F (36.2 C)   Ht 5\' 7"  (1.702 m)   Wt 172 lb (78 kg)   SpO2 98%   BMI 26.94 kg/m      03/19/2024    9:52 AM 02/28/2024   11:35 AM 02/07/2024    1:49 PM  BP/Weight  Systolic BP 132 147 130  Diastolic BP 78 86 80  Wt. (Lbs) 172  169  BMI 26.94 kg/m2  26.47 kg/m2    Physical Exam Constitutional:      General: She is not in acute distress.    Appearance: Normal appearance.  HENT:     Head: Normocephalic and atraumatic.  Cardiovascular:     Rate and Rhythm: Normal rate and regular rhythm.  Pulmonary:      Effort: Pulmonary effort is normal.     Breath sounds: Normal breath sounds. No wheezing or rales.  Neurological:     Mental Status: She is alert.  Psychiatric:        Mood and Affect: Mood normal.        Behavior: Behavior normal.     Lab Results  Component Value Date   WBC 6.4 02/07/2024   HGB 12.6 02/07/2024   HCT 38.1 02/07/2024   PLT 179 02/07/2024   GLUCOSE 83 02/07/2024   CHOL 160 01/04/2024   TRIG 80 01/04/2024   HDL 52 01/04/2024   LDLCALC 92 01/04/2024   ALT 26 12/26/2023   AST 18 12/26/2023   NA 143 02/07/2024   K 4.4 02/07/2024   CL 109 (H) 02/07/2024   CREATININE 0.79 02/07/2024   BUN 14 02/07/2024   CO2 21 02/07/2024   TSH 2.860 02/07/2023   INR 1.0 12/26/2023   HGBA1C 4.7 (L) 01/04/2024     Assessment & Plan:  Parotid mass Assessment & Plan: Has appt scheduled with ENT.    Cerebrovascular accident (CVA) due to embolism of precerebral artery [facility]) Assessment & Plan: Perioperative. No deficits. Doing well. Continue Eliquis.   Essential hypertension, benign Assessment & Plan: BP [name]. Continue current medications.   Hyperlipidemia, unspecified hyperlipidemia type Assessment & Plan: Will recheck in the near future. Continue statin. Goal at least less than 70.   Other orders -     Enalapril Maleate; Take 1 tablet (10 mg total) by mouth daily.  Dispense: 90 tablet; Refill: 3   Follow-up:  6 months  Edilberto Roosevelt Adriana Simas DO [facility]

## 2024-03-19 NOTE — Assessment & Plan Note (Signed)
 BP stable.  Continue current medications.

## 2024-03-19 NOTE — Assessment & Plan Note (Signed)
 Will recheck in the near future. Continue statin. Goal at least less than 70.

## 2024-03-19 NOTE — Assessment & Plan Note (Signed)
 Has appt scheduled with ENT.

## 2024-03-19 NOTE — Patient Instructions (Signed)
 Continue your medications.  Follow up in 6 months.  Take care  Dr. Adriana Simas

## 2024-03-19 NOTE — Assessment & Plan Note (Addendum)
 Perioperative. No deficits. Doing well. Continue Eliquis.

## 2024-04-08 ENCOUNTER — Institutional Professional Consult (permissible substitution) (INDEPENDENT_AMBULATORY_CARE_PROVIDER_SITE_OTHER): Payer: Medicare HMO | Admitting: Otolaryngology

## 2024-04-10 ENCOUNTER — Encounter (INDEPENDENT_AMBULATORY_CARE_PROVIDER_SITE_OTHER): Payer: Self-pay

## 2024-04-10 ENCOUNTER — Ambulatory Visit (INDEPENDENT_AMBULATORY_CARE_PROVIDER_SITE_OTHER): Payer: Medicare HMO | Admitting: Otolaryngology

## 2024-04-10 VITALS — BP 156/82 | HR 77 | Ht 67.0 in | Wt 169.0 lb

## 2024-04-10 DIAGNOSIS — K118 Other diseases of salivary glands: Secondary | ICD-10-CM

## 2024-04-10 NOTE — Patient Instructions (Signed)
 I have ordered an imaging study for you to complete prior to your next visit. Please call [facility] Scheduling at [phone] to schedule your imaging if you have not received a call within 24 hours. If you are unable to complete your imaging study prior to your next scheduled visit please call our office to let us know.

## 2024-04-10 NOTE — Progress Notes (Signed)
 Dear Dr. Mariellen Shores, Here is my assessment for our mutual patient, [name]. Thank you for allowing me the opportunity to care for your patient. Please do not hesitate to contact me should you have any other questions. Sincerely, Dr. Milon Aloe  Otolaryngology Clinic Note Referring provider: Dr. Mariellen Shores HPI:  [name] is a 78 y.o. female kindly referred by Dr. Mariellen Shores for evaluation of parotid mass  Initial visit (03/2024): Patient reports: incidentally noted during workup for her stroke, she had not noted it prior but after workup she has noted it behind her ear. Feels a bit bigger than it was. Not painful, no numbness. No known facial or neck skin cancers. No prior US  or biopsy. 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 Alcohol: no  PMHx: MV insufficiency s/p repair, HLD, Thyroid  Ca s/p thyroidectomy, HTN, CVA, PAF on Eliquis   Independent Review of Additional Tests or Records:  Dr. Debrah Fan (03/19/2024): noted incidentally noted parotid mass; ref to ENT CTA 01/04/2024 independently interpreted: right parotid nodule noted over tail of parotid; no obvious worrisome neck adenopathy noted but study is suboptimal for evalulation  CBC and BMP 02/07/2024: BUN/Cr 14/0.79, WBC 6.4, Plt 179  PMH/Meds/All/SocHx/FamHx/ROS:   Past Medical History:  Diagnosis Date   Arthritis    Cancer (HCC)    Thyroid  cancer   COVID-19 virus infection 12/2020   Family history of adverse reaction to anesthesia    mother "couldn't wake up good" and would throw up   GERD (gastroesophageal reflux disease)    Heart murmur    History of kidney stones    Hypercholesteremia    resolved after gastric bypass    Hypertension    Hypothyroidism    Pneumonia 11/22/2023   LLL   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 Lorie Rook   Severe mitral insufficiency    Tachycardia      Past Surgical History:  Procedure Laterality Date   ABDOMINAL HYSTERECTOMY     BALLOON DILATION N/A 07/17/2023   Procedure: BALLOON DILATION;  Surgeon: Vinetta Greening, DO;  [facility];  Service: Endoscopy;  Laterality: N/A;   BILATERAL SALPINGOOPHORECTOMY     BREAST EXCISIONAL BIOPSY Bilateral 1995   benign   CHOLECYSTECTOMY     COLONOSCOPY  11/14/2011   Procedure: COLONOSCOPY;  Surgeon: Suzette Espy, MD;  [facility]: [facility];  Service: Endoscopy;  Laterality: N/A;   ESOPHAGOGASTRODUODENOSCOPY (EGD) WITH PROPOFOL  N/A 07/17/2023   Procedure: ESOPHAGOGASTRODUODENOSCOPY (EGD) WITH L;  Surgeon: Vinetta Greening, DO;  [facility];  Service: Endoscopy;  Laterality: N/A;   REPLACEMENT TOTAL KNEE BILATERAL     RIGHT/LEFT HEART CATH AND CORONARY ANGIOGRAPHY N/A 11/16/2023   Procedure: RIGHT/LEFT HEART CATH AND CORONARY ANGIOGRAPHY;  Surgeon: Kyra Phy, MD;  Location: [facility] INVAS[facility] CV LAB;  Service: Cardiovascular;  Laterality: N/A;   ROUX-EN-Y PROCEDURE  2007   TEE WITHOUT CARDIOVERSION N/A 11/11/2022   Procedure: TRANSESOPHAGEAL ECHOCARDIOGRAM (TEE);  Surgeon: Mallipeddi, Vishnu P, MD;  Location: [facility] ORS;  Service: Cardiovascular;  Laterality: N/A;   TONSILLECTOMY     TOTAL HIP ARTHROPLASTY Right 12/23/2019   Procedure: RIGHT TOTAL HIP ARTHROPLASTY ANTERIOR APPROACH;  Surgeon: Wes Hamman, MD;  Location: [facility] OR;  Service: Orthopedics;  Laterality: Right;   TOTAL HIP ARTHROPLASTY Left 11/21/2022   Procedure: LEFT TOTAL HIP ARTHROPLASTY ANTERIOR APPROACH;  Surgeon: Wes Hamman, MD;  Location: [facility] OR;  Service: Orthopedics;  Laterality: Left;  3-C   TOTAL THYROIDECTOMY  12/2010   TRANSCATHETER MITRAL EDGE TO EDGE REPAIR N/A 01/03/2024   Procedure:  TRANSCATHETER MITRAL EDGE TO EDGE REPAIR;  Surgeon: Kyra Phy, MD;  [facility]: [facility];  Service: Cardiovascular;  Laterality: N/A;   TRANSESOPHAGEAL ECHOCARDIOGRAM (CATH LAB) N/A 10/27/2023   Procedure: TRANSESOPHAGEAL ECHOCARDIOGRAM;  Surgeon: Loyde Rule, MD;  [facility] INVASIVE CV LAB;  Service: Cardiovascular;  Laterality: N/A;   TRANSESOPHAGEAL ECHOCARDIOGRAM (CATH LAB) N/A 01/03/2024   Procedure: TRANSESOPHAGEAL ECHOCARDIOGRAM;  Surgeon: Kyra Phy, MD;  Location: [facility] INVASIVE CV LAB;  Service: Cardiovascular;  Laterality: N/A;   TUBAL LIGATION      Family History  Problem Relation Age of Onset   Breast cancer Mother    Pneumonia Mother 74   CAD Father 59   Breast cancer Sister    CAD Sister 38   Other Maternal Grandmother 48   CAD Brother 68   Other Brother 35       drug overdose   CAD Other 32   Colon cancer Neg Hx      Social Connections: Moderately Integrated (03/15/2024)   Social Connection and Isolation Panel [NHANES]    Frequency of Communication with Friends and Family: More than three times a week    Frequency of Social Gatherings with Friends and Family: Three times a week    Attends Religious Services: More than 4 times per year    Active Member of Clubs or Organizations: Yes    Attends Banker Meetings: More than 4 times per year    Marital Status: Widowed      Current Outpatient Medications:    amoxicillin  (AMOXIL ) 500 MG tablet, TAKE 4 TABLETS BY MOUTH 1 HOUR PRIOR TO DENTAL PROCEDURES, Disp: 12 tablet, Rfl: 12   apixaban  (ELIQUIS ) 5 MG TABS tablet, Take 1 tablet (5 mg total) by mouth 2 (two) times daily., Disp: 120 tablet, Rfl: 2   Biotin 5000 MCG CAPS, Take 5,000 mcg by mouth daily., Disp: , Rfl:    Calcium  Citrate-Vitamin D  (CALCIUM  CITRATE+D3 PO), Take 1 tablet by mouth daily., Disp: , Rfl:    carboxymethylcellulose (REFRESH PLUS) 0.5 % SOLN, Place 1 drop into both eyes 3 (three) times daily as needed (Dry eye).,  Disp: , Rfl:    Cholecalciferol  (VITAMIN D3 ULTRA STRENGTH) 125 MCG (5000 UT) capsule, Take 5,000 Units by mouth daily., Disp: , Rfl:    Cyanocobalamin  5000 MCG TBDP, Take 5,000 mcg by mouth daily., Disp: , Rfl:    enalapril  (VASOTEC ) 10 MG tablet, Take 1 tablet (10 mg total) by mouth daily., Disp: 90 tablet, Rfl: 3   famotidine  (PEPCID ) 40 MG tablet, Take 40 mg by mouth daily., Disp: , Rfl:    ferrous sulfate  325 (65 FE) MG tablet, Take 325 mg by mouth daily., Disp: , Rfl:    levothyroxine  (SYNTHROID ) 150 MCG tablet, Take 1 tablet (150 mcg total) by mouth daily before breakfast. (Patient taking differently: Take 150 mcg by mouth See admin instructions. *MUST TAKE BRAND* Take 150 mg daily except skip dose on Sundays), Disp: 30 tablet, Rfl: 0   Magnesium  250 MG CAPS, Take 250 mg by mouth daily., Disp: , Rfl:    metoprolol  tartrate (LOPRESSOR ) 25  MG tablet, Take 1 tablet (25 mg total) by mouth 2 (two) times daily as needed., Disp: , Rfl:    Multiple Vitamin (MULTIVITAMIN WITH MINERALS) TABS tablet, Take 1 tablet by mouth in the morning and at bedtime. Woman 50+, Disp: , Rfl:    pantoprazole  (PROTONIX ) 40 MG tablet, Take 1 tablet (40 mg total) by mouth daily., Disp: 60 tablet, Rfl: 1   rosuvastatin  (CRESTOR ) 20 MG tablet, Take 1 tablet (20 mg total) by mouth daily., Disp: 60 tablet, Rfl: 2   sulfamethoxazole -trimethoprim  (BACTRIM  DS) 800-160 MG tablet, Patient will take 1 tablet by mouth every 12 hours starting on 04/16/2024 prior to procedure., Disp: 6 tablet, Rfl: 0   valACYclovir  (VALTREX ) 1000 MG tablet, TAKE 2 TABLETS AT FIRST SIGN OF FEVER BLISTER, THEN 2 TABLETS 2 HOURSLATER, Disp: 4 tablet, Rfl: 5   Zinc 50 MG TABS, Take 50 mg by mouth daily., Disp: , Rfl:   Current Facility-Administered Medications:    botulinum toxin Type A  (BOTOX ) injection 200 Units, 200 Units, Intramuscular, Once,    ciprofloxacin  (CIPRO ) tablet 500 mg, 500 mg, Oral, Once,    lidocaine  (XYLOCAINE ) 2 % (with pres)  injection 200 mg, 10 mL, Other, Once,    Physical Exam:   BP (!) 156/82 (BP Location: Right Arm, Patient Position: Sitting, Cuff Size: Normal)   Pulse 77   Ht 5\' 7"  (1.702 m)   Wt 169 lb (76.7 kg)   SpO2 97%   BMI 26.47 kg/m   Salient findings:  CN II-XII intact - CN 7 function intact in all branches b/l, facial sensation intact b/l V1-3 to light touch Bilateral EAC clear and TM intact with well pneumatized middle ear spaces Anterior rhinoscopy: Septum intact; bilateral inferior turbinates without significant hypertrophy No lesions of oral cavity/oropharynx; dentition fair No obviously palpable neck masses/lymphadenopathy/thyromegaly EXCEPT right well demarcated, palpable, tail of parotid mass. No adenopathy noted in neck which is palpable; thyroidectomy scar well healed No respiratory distress or stridor No skin lesions noted over face or neck per my exam concerning for carcinoma  Seprately Identifiable Procedures:   None  Impression & Plans:  [name] is a 78 y.o. female with:  1. Parotid nodule    Right tail of parotid nodule noted incidentally. She is otherwise asymptomatic. We discussed etiologies benign and malignant for this. Discussed options including observation v/s FNA to start. Will order and schedule US  and FNA. F/u in 6 weeks to discuss results, sooner if any concerns  See below regarding exact medications prescribed this encounter including dosages and route: No orders of the defined types were placed in this encounter.     Thank you for allowing me the opportunity to care for your patient. Please do not hesitate to contact me should you have any other questions.  Sincerely, Milon Aloe, MD Otolaryngologist (ENT), [facility] Phone: [phone] Fax: [phone]  04/10/2024, 10:22 AM   MDM:  Level 4 Complexity/Problems addressed: mod - new problem, unknown prognosis needing further workukp Data complexity: high - independent  review of notes, labs, ordering tests; independent interpretation of imaging - Morbidity: low  - Prescription Drug prescribed or managed: no

## 2024-04-15 ENCOUNTER — Encounter: Payer: Self-pay | Admitting: Pharmacist

## 2024-04-15 ENCOUNTER — Telehealth: Payer: Self-pay

## 2024-04-15 MED ORDER — ENOXAPARIN SODIUM 120 MG/0.8ML IJ SOSY: 120.0000 mg | PREFILLED_SYRINGE | INTRAMUSCULAR | 0 refills | Status: DC

## 2024-04-15 NOTE — Addendum Note (Signed)
 Addended by: Jasier Calabretta D on: 04/15/2024 04:07 PM   Modules accepted: Orders

## 2024-04-15 NOTE — Telephone Encounter (Signed)
 I will forward this to the preop APP to review the notes from the requesting office

## 2024-04-15 NOTE — Telephone Encounter (Signed)
 Urology office would like clear instruction about medication hold. Please advise

## 2024-04-15 NOTE — Telephone Encounter (Signed)
 Patient called back. New date is 5/28 at 8:30 AM  5/25 Take AM Eliquis , No PM Eliquis . Inject 120mg  of enoxaparin  into the abdomen at 8 PM   5/26 No Eliquis , Inject 120mg  of enoxaparin  into the abdomen at 8 PM   5/27 No Eliquis , no enoxaparin    5/28 Procedure day: No Eliquis , No enoxaparin   Will send to her mychart. Enoxaparin  already sent in  MD office will be sending new clearance form.

## 2024-04-15 NOTE — Telephone Encounter (Addendum)
 I called and spoke with patient. I apologized that we failed to give her detailed instructions and now her procedure is delayed. She is comfortable giving the injections. She will call me back ASAP with the new date so I can give her detailed instructions. She has not held her Eliquis .  She will hold Eliquis  for 2.5 days for procedure. Once date is set, will provide exact instructions. Rx for enoxaparin  sent in.  5/? Take AM Eliquis , No PM Eliquis . Inject 120mg  of enoxaparin  into the abdomen at 8 PM  5/? No Eliquis , Inject 120mg  of enoxaparin  into the abdomen at 8 PM  5/? No Eliquis , no enoxaparin   5/? Procedure day: No Eliquis , No enoxaprain

## 2024-04-15 NOTE — Telephone Encounter (Signed)
 I called patient to confirm her abx instructions prior to her Botox  procedure scheduled on 04/17/2024 and to confirm her Eliquis  hold and lovenox  bridge.  Patient states she took her Eliquis  this morning and has not heard form Cardiology regarding the lovenox  bridge.  I called cardiology and they stated they did inform her to hold her Eliquis  however, they are unsure of bridging instructions.  I requested a call back from the nurse so I can discuss bridging protocol prior to next botox  procedure.  Will reschedule botox  procedure and notify MD.

## 2024-04-15 NOTE — Telephone Encounter (Signed)
 Patient states she has heard from Cardiology and they did not get her bridged for her botox  procedure.  Botox  procedure has been rescheduled with patient and a new clearance has been sent to cardiology with updated botox  date.

## 2024-04-15 NOTE — Telephone Encounter (Signed)
   Pre-operative Risk Assessment    Patient Name: [name]  DOB: 1946/01/26 MRN: [id number]   Date of last office visit: 02/07/24 KATHRYN THOMPSON, PA-C Date of next office visit: 06/06/24 Alyssa Backbone, MD   Request for Surgical Clearance    Procedure:   CYSTOSCOPY WITH BOTOX   Date of Surgery:  Clearance 05/08/24                                Surgeon:  DR Lois Rink Surgeon's Group or Practice Name:  [facility] [place] Phone number:  [phone] Fax number:  [phone]   Type of Clearance Requested:   - Medical  - Pharmacy:  Hold Apixaban  (Eliquis ) BE BRIDGED WITH LOVENOX  PRIOR TO PROCEDURE   Type of Anesthesia:  Not Indicated   Additional requests/questions:    Signed, Collin Deal   04/15/2024, 5:07 PM

## 2024-04-16 NOTE — Telephone Encounter (Signed)
   Primary Cardiologist: Arun K Thukkani, MD  Chart reviewed as part of pre-operative protocol coverage. Given past medical history and time since last visit, based on ACC/AHA guidelines, [name] would be at acceptable risk for the planned procedure without further cardiovascular testing.   Patient was advised that if she develops new symptoms prior to surgery to contact our office to arrange a follow-up appointment. She verbalized understanding.  Enoxaparin  Instructions per Pharm D 5/25 Take AM Eliquis , No PM Eliquis . Inject 120mg  of enoxaparin  into the abdomen at 8 PM. Rotate the injection sites   5/26 No Eliquis , Inject 120mg  of enoxaparin  into the abdomen at 8 PM   5/27 No Eliquis , no enoxaparin    5/28 Procedure day: No Eliquis , No enoxaparin    Resume Eliquis  when advised by Dr. Claretta Croft   I will route this recommendation to the requesting party via Epic fax function and remove from pre-op pool.  Please call with questions.  Gerldine Koch, NP-C  04/16/2024, 11:06 AM [address], [address] [place], [place] [address] Office [phone] Fax [phone]

## 2024-04-17 ENCOUNTER — Other Ambulatory Visit: Admitting: Urology

## 2024-04-23 ENCOUNTER — Telehealth: Payer: Self-pay | Admitting: Family Medicine

## 2024-04-23 NOTE — Telephone Encounter (Signed)
 Refill on pantoprazole  (PROTONIX ) 40 MG tablet  [facility]

## 2024-04-24 ENCOUNTER — Ambulatory Visit (HOSPITAL_COMMUNITY)
Admission: RE | Admit: 2024-04-24 | Discharge: 2024-04-24 | Disposition: A | Discharge status: Home / Self Care | Source: Ambulatory Visit | Attending: Otolaryngology | Admitting: Otolaryngology

## 2024-04-24 DIAGNOSIS — K118 Other diseases of salivary glands: Secondary | ICD-10-CM | POA: Insufficient documentation

## 2024-04-24 DIAGNOSIS — R221 Localized swelling, mass and lump, neck: Secondary | ICD-10-CM | POA: Diagnosis not present

## 2024-04-25 NOTE — Progress Notes (Unsigned)
 Rosetta Cons, MD  [facility] Ultrasound guided FNA of right parotid nodule.  FNA ONLY--NO CORE       Previous Messages    ----- Message ----- From: Derrell Flight Sent: 04/25/2024  10:10 AM EDT To: Rosetta Cons, MD; Derrell Flight; * Subject: US  FNA SOFT TISSUE                            Procedure :US  FNA SOFT TISSUE  Reason :right parotid nodule; need FNA, not core biopsy Dx: Parotid nodule  History :US  Soft Tissue Head/Neck (NON-THYROID ),CT ANGIO HEAD NECK W WO CM,MR BRAIN WO CONTRAST,CT HEAD WO CONTRAST (5MM),DG Chest 2 View,DG Chest 2 View,XR HIP UNILAT W OR W/O PELVIS 2-3 VIEWS LEFT  Provider:Patel, Reina Cara, MD  Provider contact ; [phone]

## 2024-04-25 NOTE — Progress Notes (Unsigned)
 Hailey Asper, DO  [name] Procedure Requests Cc: Hailey Cons, MD OK for US  guided FNA of the right parotid mass.  [name]       Previous Messages    ----- Message ----- From: [name] Sent: 04/25/2024  10:10 AM EDT To: Hailey Cons, MD; [name]; * Subject: US  FNA SOFT TISSUE                            Procedure :US  FNA SOFT TISSUE  Reason :right parotid nodule; need FNA, not core biopsy Dx: Parotid nodule  History :US  Soft Tissue Head/Neck (NON-THYROID ),CT ANGIO HEAD NECK W WO CM,MR BRAIN WO CONTRAST,CT HEAD WO CONTRAST (5MM),DG Chest 2 View,DG Chest 2 View,XR HIP UNILAT W OR W/O PELVIS 2-3 VIEWS LEFT  Provider:Patel, Hailey Cara, MD  Provider contact ; [phone]

## 2024-04-25 NOTE — Progress Notes (Unsigned)
 Rosetta Cons, MD  Derrell Flight Local should be fine       Previous Messages    ----- Message ----- From: Derrell Flight Sent: 04/25/2024  10:54 AM EDT To: Rosetta Cons, MD Subject: RE: US  FNA SOFT TISSUE                        Good Morning, Would you like the pt to have local anesthetic or moderate sedation? Thanks Bartholomew Light ----- Message ----- From: Rosetta Cons, MD Sent: 04/25/2024  10:34 AM EDT To: Derrell Flight Subject: RE: US  FNA SOFT TISSUE                        Ultrasound guided FNA of right parotid nodule.  FNA ONLY--NO CORE ----- Message ----- From: Derrell Flight Sent: 04/25/2024  10:10 AM EDT To: Rosetta Cons, MD; Derrell Flight; * Subject: US  FNA SOFT TISSUE                            Procedure :US  FNA SOFT TISSUE  Reason :right parotid nodule; need FNA, not core biopsy Dx: Parotid nodule  History :US  Soft Tissue Head/Neck (NON-THYROID ),CT ANGIO HEAD NECK W WO CM,MR BRAIN WO CONTRAST,CT HEAD WO CONTRAST (5MM),DG Chest 2 View,DG Chest 2 View,XR HIP UNILAT W OR W/O PELVIS 2-3 VIEWS LEFT  Provider:Patel, Reina Cara, MD  Provider contact ; [phone]

## 2024-04-26 ENCOUNTER — Telehealth: Payer: Self-pay | Admitting: Family Medicine

## 2024-04-26 ENCOUNTER — Other Ambulatory Visit: Payer: Self-pay

## 2024-04-26 MED ORDER — PANTOPRAZOLE SODIUM 40 MG PO TBEC: 40.0000 mg | DELAYED_RELEASE_TABLET | Freq: Every day | ORAL | 1 refills | Status: AC

## 2024-04-26 NOTE — Telephone Encounter (Signed)
 Patient would like a prescription for pantoprazole  40 mg QTY 60 [facility] [place]

## 2024-05-08 ENCOUNTER — Encounter: Payer: Self-pay | Admitting: Urology

## 2024-05-08 ENCOUNTER — Ambulatory Visit: Admitting: Urology

## 2024-05-08 VITALS — BP 121/79 | HR 85

## 2024-05-08 DIAGNOSIS — N3281 Overactive bladder: Secondary | ICD-10-CM | POA: Diagnosis not present

## 2024-05-08 LAB — URINALYSIS, ROUTINE W REFLEX MICROSCOPIC
Bilirubin, UA: NEGATIVE
Glucose, UA: NEGATIVE
Ketones, UA: NEGATIVE
Leukocytes,UA: NEGATIVE
Nitrite, UA: NEGATIVE
Protein,UA: NEGATIVE
RBC, UA: NEGATIVE
Specific Gravity, UA: 1.025 (ref 1.005–1.030)
Urobilinogen, Ur: 0.2 mg/dL (ref 0.2–1.0)
pH, UA: 6 (ref 5.0–7.5)

## 2024-05-08 MED ORDER — LIDOCAINE HCL 1 % IJ SOLN
20.0000 mL | Freq: Once | INTRAMUSCULAR | Status: AC
  Administered 2024-05-08: 20 mL

## 2024-05-08 MED ORDER — ONABOTULINUMTOXINA 100 UNITS IJ SOLR
100.0000 [IU] | Freq: Once | INTRAMUSCULAR | Status: AC
  Administered 2024-05-08: 200 [IU] via INTRAMUSCULAR

## 2024-05-08 NOTE — Progress Notes (Signed)
 Intraservice. Urojet was instilled using sterile technique into the urethra without difficulty.  Anesthesia: 1% plain lidocaine 50 cc instilled via careful sterile placement of a foley catheter. The bladder was allowed to achieve anesthesia via gently rolling the patient side to side for 15 minutes. The catheter was removed after the instillation. TIME ELEMENT = 15 MINUTES  Prep: BOTOX(R) (Allergan, [place], [place], 100 Units per vial) was reconstituted gently with 10 mL of 0.9% sterile, nonpreserved saline solution and drawn into a 10-mL syringe. An additional 1 mL of sterile, nonpreserved saline solution was drawn into a separate syringe for the final injection so that the remaining BOTOX(R) in the needle was delivered to the bladder. TIME ELEMENT = 15 MINUTES  Procedure: A rigid cystoscope was gently inserted into the urinary bladder via the urethra. The trigone was identified and evaluated. Next we identified 20 template sites within the urinary bladder. The bladder is only instilled with 100 cc volume to ensure adequate muscle wall thickness to prevent needle penetration beyond the muscle wall. Careful injections were undertaken for a total of 20 injections using a Laborie depth-limiting needle [employer] [place], [place]). 0.5 cc volume was delivered at each site carefully into the muscle without excessive bleb formation submucosally. Targeted zones were injected over TIME ELEMENT =15 MINUTES.  Postservice: The patient was monitored in recovery for 30 minutes. Patient voided successfully. Clean intermittent catheterization training was reviewed again for the patient. Respiratory and cardiac status were confirmed for stability. Mentation is assessed to be adequate for discharge with alteration in status. TIME ELEMENT = 30 MINUTES. TOTAL PROCEDURE TIME: 1 HR AND 10 MINUTES  Qty: 100 Unit: kit Route: Intra-vesically Freq: None Site: None Mfgr: Allergan  Patient was instructed to  call the office immediately for bloody urine, difficulty urinating, urinary retention, painful or frequent urination, fever, chills, nausea, vomiting, or other illness. The patient stated that she understood these instructions and would comply with them. Patient was provided prophylactic antibiotics (except aminoglycosides) for 1 to 3 days postprocedure.

## 2024-05-08 NOTE — Patient Instructions (Signed)
 Botulinum Toxin Bladder Injection  A botulinum toxin bladder injection is a procedure to treat an overactive bladder. During the procedure, a drug called botulinum toxin is injected into the bladder through a long, thin needle. This drug relaxes the bladder muscles and reduces overactivity. You may need this procedure if your medicines are not working or you cannot take them. The procedure may be repeated as needed. The treatment is done once and it usually lasts for 6 months. Your health care provider will monitor you to see how well you respond. Tell a health care provider about: Any allergies you have. All medicines you are taking, including vitamins, herbs, eye drops, creams, and over-the-counter medicines. Any problems you or family members have had with anesthetic medicines. Any bleeding problems you have. Any surgeries you have had. Any medical conditions you have. Any previous reactions to a botulinum toxin injection. Any symptoms of urinary tract infection. These include chills, fever, a burning feeling when passing urine, and needing to pass urine often. Whether you are pregnant or may be pregnant. What are the risks? Generally this is a safe procedure. However, problems may occur, including: Not being able to pass urine. If this happens, you may need to have your bladder emptied with a thin tube (urinary catheter). Bleeding. Urinary tract infection. Allergic reaction to the botulinum toxin. Pain or burning when passing urine. Damage to nearby structures or organs. What happens before the procedure? When to stop eating and drinking Follow instructions from your health care provider about what you may eat and drink before your procedure. These may include: 8 hours before the procedure Stop eating most foods. Do not eat meat, fried foods, or fatty foods. Eat only light foods, such as toast or crackers. All liquids are okay except energy drinks and alcohol. 6 hours before the  procedure Stop eating. Drink only clear liquids, such as water, clear fruit juice, black coffee, plain tea, and sports drinks. Do not drink energy drinks or alcohol. 2 hours before the procedure Stop drinking all liquids. You may be allowed to take medicines with small sips of water. If you do not follow your health care provider's instructions, your procedure may be delayed or canceled. Medicines Ask your health care provider about: Changing or stopping your regular medicines. This is especially important if you are taking diabetes medicines or blood thinners. Taking medicines such as aspirin and ibuprofen. These medicines can thin your blood. Do not take these medicines unless your health care provider tells you to take them. Taking over-the-counter medicines, vitamins, herbs, and supplements. General instructions Ask your health care provider what steps will be taken to help prevent infection. These steps may include: Removing hair at the procedure site. Washing skin with a germ-killing soap. Taking antibiotic medicine. If you will be going home right after the procedure, plan to have a responsible adult: Take you home from the hospital or clinic. You will not be allowed to drive. Care for you for the time you are told. What happens during the procedure?  You will be asked to empty your bladder. An IV will be inserted into one of your veins. You will be given one or more of the following: A medicine to help you relax (sedative). A medicine to numb the area (local anesthetic). A medicine to make you fall asleep (general anesthetic). A long, thin scope called a cystoscope will be passed into your bladder through the part of the body that carries urine from your bladder (urethra). The cystoscope  will be used to fill your bladder with water. A long needle will be passed through the cystoscope and into the bladder. The botulinum toxin will be injected into your bladder. It may be  injected into multiple areas of your bladder. The cystoscope will be removed and your bladder will be emptied with a urinary catheter. The procedure may vary among health care providers and hospitals. What can I expect after the procedure? After your procedure, it is common to have: Blood-tinged urine. Burning or soreness when you pass urine. Follow these instructions at home: Medicines Take over-the-counter and prescription medicines only as told by your health care provider. If you were prescribed an antibiotic medicine, take it as told by your health care provider. Do not stop using the antibiotic even if you start to feel better. General instructions  If you were given a sedative during the procedure, it can affect you for several hours. Do not drive or operate machinery until your health care provider says that it is safe. Drink enough fluid to keep your urine pale yellow. Return to your normal activities as told by your health care provider. Ask your health care provider what activities are safe for you. Keep all follow-up visits. Contact a health care provider if you have: A fever or chills. Blood-tinged urine for more than one day after your procedure. Worsening pain or burning when you pass urine. Pain or burning when passing urine for more than two days after your procedure. Trouble emptying your bladder. Get help right away if you: Have bright red blood in your urine. Are unable to pass urine. Summary A botulinum toxin bladder injection is a procedure to treat an overactive bladder. This is generally a safe procedure. However, problems may occur, including not being able to pass urine, bleeding, infection, pain, and an allergic reaction to the botulinum toxin. You will be told when to stop eating and drinking, and what medicines to change or stop. Follow instructions carefully. After the procedure, it is common to have blood in your urine and to have soreness or burning when  passing urine. Contact a health care provider if you have a fever, blood in your urine for more than a few days, or trouble passing urine. Get help right away if you have bright red blood in your urine, or if you are unable to pass urine. This information is not intended to replace advice given to you by your health care provider. Make sure you discuss any questions you have with your health care provider. Document Revised: 06/04/2021 Document Reviewed: 06/04/2021 Elsevier Patient Education  2024 [employer].

## 2024-05-08 NOTE — Progress Notes (Signed)
 Bladder Instillation for Botox  procedure  Due to OAB patient is present today for Botox . A Bladder Instillation of lidocaine  1% was given. Patient was cleaned and prepped in a sterile fashion [id number].  A 14FR catheter was inserted, urine return was noted 10ml, urine was  Clear yellow in color.  50 ml was instilled into the bladder. The catheter was then removed. Patient tolerated well, no complications were noted. Patient held in bladder for 30 minutes prior to procedure starting.  MD was notified of bladder instillation time.  Performed by: MD to see next for botox  procedure

## 2024-05-17 ENCOUNTER — Ambulatory Visit (HOSPITAL_COMMUNITY)
Admission: RE | Admit: 2024-05-17 | Discharge: 2024-05-17 | Disposition: A | Discharge status: Home / Self Care | Payer: Self-pay | Source: Ambulatory Visit | Attending: Otolaryngology | Admitting: Otolaryngology

## 2024-05-17 DIAGNOSIS — K116 Mucocele of salivary gland: Secondary | ICD-10-CM | POA: Diagnosis not present

## 2024-05-17 DIAGNOSIS — K118 Other diseases of salivary glands: Secondary | ICD-10-CM | POA: Insufficient documentation

## 2024-05-17 DIAGNOSIS — E890 Postprocedural hypothyroidism: Secondary | ICD-10-CM | POA: Diagnosis not present

## 2024-05-17 NOTE — Procedures (Signed)
 Interventional Radiology Procedure Note  Procedure: US  guided aspiration of right parotid cyst  Complications: None  Estimated Blood Loss: None  Recommendations:  - DC home  Signed,  Roxie Cord, MD

## 2024-05-20 LAB — CYTOLOGY - NON PAP

## 2024-05-23 ENCOUNTER — Ambulatory Visit (INDEPENDENT_AMBULATORY_CARE_PROVIDER_SITE_OTHER): Admitting: Otolaryngology

## 2024-05-23 ENCOUNTER — Encounter (INDEPENDENT_AMBULATORY_CARE_PROVIDER_SITE_OTHER): Payer: Self-pay | Admitting: Otolaryngology

## 2024-05-23 VITALS — BP 130/83 | HR 73 | Ht 67.0 in | Wt 169.0 lb

## 2024-05-23 DIAGNOSIS — K118 Other diseases of salivary glands: Secondary | ICD-10-CM | POA: Diagnosis not present

## 2024-05-23 NOTE — Progress Notes (Signed)
 Dear Dr. Debrah Fan, Here is my assessment for our mutual patient, [name]. Thank you for allowing me the opportunity to care for your patient. Please do not hesitate to contact me should you have any other questions. Sincerely, Dr. Milon Aloe  Otolaryngology Clinic Note Referring provider: Dr. Debrah Fan HPI:  [name] is a 78 y.o. female kindly referred by Dr. Debrah Fan for evaluation of parotid mass  Initial visit (03/2024): Patient reports: incidentally noted during workup for her stroke, she had not noted it prior but after workup she has noted it behind her ear. Feels a bit bigger than it was. Not painful, no numbness. No known facial or neck skin cancers. No prior US  or biopsy. Patient otherwise denies: - dysphagia, unintentional weight loss - changes in voice, shortness of breath, hemoptysis - ear pain, other neck masses  --------------------------------------------------------- 05/23/2024 Returns for follow up. She reports that she has overall done well. No pain. Felt like the cyst had gone down after bx, but now back to same size. No facial weakness. We discussed her path.   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 Alcohol: no  PMHx: MV insufficiency s/p repair, HLD, Thyroid  Ca s/p thyroidectomy, HTN, CVA, PAF on Eliquis   Independent Review of Additional Tests or Records:  Dr. Debrah Fan (03/19/2024): noted incidentally noted parotid mass; ref to ENT CTA 01/04/2024 independently interpreted: right parotid nodule noted over tail of parotid; no obvious worrisome neck adenopathy noted but study is suboptimal for evalulation  CBC and BMP 02/07/2024: BUN/Cr 14/0.79, WBC 6.4, Plt 179 US  interpreted independently 04/24/2024 - right solid/cystic parotid mass, 1.5x1 cm Path (05/17/2024):  PMH/Meds/All/SocHx/FamHx/ROS:   Past Medical History:  Diagnosis  Date   Arthritis    Cancer (HCC)    Thyroid  cancer   COVID-19 virus infection 12/2020   Family history of adverse reaction to anesthesia    mother couldn't wake up good and would throw up   GERD (gastroesophageal reflux disease)    Heart murmur    History of kidney stones    Hypercholesteremia    resolved after gastric bypass   Hypertension    Hypothyroidism    Pneumonia 11/22/2023   LLL   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 Lorie Rook   Severe mitral insufficiency    Tachycardia      Past Surgical History:  Procedure Laterality Date   ABDOMINAL HYSTERECTOMY     BALLOON DILATION N/A 07/17/2023   Procedure: BALLOON DILATION;  Surgeon: Vinetta Greening, DO;  [facility]: [facility];  Service: Endoscopy;  Laterality: N/A;   BILATERAL SALPINGOOPHORECTOMY     BREAST EXCISIONAL BIOPSY Bilateral 1995   benign   CHOLECYSTECTOMY     COLONOSCOPY  11/14/2011   Procedure: COLONOSCOPY;  Surgeon: Suzette Espy, MD;  Location: [facility];  Service: Endoscopy;  Laterality: N/A;   ESOPHAGOGASTRODUODENOSCOPY (EGD) WITH PROPOFOL  N/A 07/17/2023   Procedure: ESOPHAGOGASTRODUODENOSCOPY (EGD) WITH L;  Surgeon: Vinetta Greening, DO;  Location: [facility];  Service: Endoscopy;  Laterality: N/A;   REPLACEMENT TOTAL KNEE BILATERAL     RIGHT/LEFT HEART CATH AND CORONARY ANGIOGRAPHY N/A 11/16/2023   Procedure: RIGHT/LEFT HEART CATH AND CORONARY ANGIOGRAPHY;  Surgeon: Kyra Phy, MD;  Location: [facility];  Service: Cardiovascular;  Laterality: N/A;   ROUX-EN-Y PROCEDURE  2007   TEE WITHOUT  CARDIOVERSION N/A 11/11/2022   Procedure: TRANSESOPHAGEAL ECHOCARDIOGRAM (TEE);  Surgeon: Mallipeddi, Vishnu P, MD;  Location: [facility];  Service: Cardiovascular;  Laterality: N/A;   TONSILLECTOMY     TOTAL HIP ARTHROPLASTY Right 12/23/2019   Procedure: RIGHT TOTAL HIP ARTHROPLASTY ANTERIOR APPROACH;  Surgeon: Wes Hamman, MD;  Location: [facility];   Service: Orthopedics;  Laterality: Right;   TOTAL HIP ARTHROPLASTY Left 11/21/2022   Procedure: LEFT TOTAL HIP ARTHROPLASTY ANTERIOR APPROACH;  Surgeon: Wes Hamman, MD;  Location: [facility];  Service: Orthopedics;  Laterality: Left;  3-C   TOTAL THYROIDECTOMY  12/2010   TRANSCATHETER MITRAL EDGE TO EDGE REPAIR N/A 01/03/2024   Procedure: TRANSCATHETER MITRAL EDGE TO EDGE REPAIR;  Surgeon: Kyra Phy, MD;  [facility]: [facility];  Service: Cardiovascular;  Laterality: N/A;   TRANSESOPHAGEAL ECHOCARDIOGRAM (CATH LAB) N/A 10/27/2023   Procedure: TRANSESOPHAGEAL ECHOCARDIOGRAM;  Surgeon: Loyde Rule, MD;  Location: [facility] INVASIVE CV LAB;  Service: Cardiovascular;  Laterality: N/A;   TRANSESOPHAGEAL ECHOCARDIOGRAM (CATH LAB) N/A 01/03/2024   Procedure: TRANSESOPHAGEAL ECHOCARDIOGRAM;  Surgeon: Kyra Phy, MD;  Location: [facility] INVASIVE CV LAB;  Service: Cardiovascular;  Laterality: N/A;   TUBAL LIGATION      Family History  Problem Relation Age of Onset   Breast cancer Mother    Pneumonia Mother 85   CAD Father 39   Breast cancer Sister    CAD Sister 62   Other Maternal Grandmother 19   CAD Brother 62   Other Brother 47       drug overdose   CAD Other 32   Colon cancer Neg Hx      Social Connections: Moderately Integrated (03/15/2024)   Social Connection and Isolation Panel    Frequency of Communication with Friends and Family: More than three times a week    Frequency of Social Gatherings with Friends and Family: Three times a week    Attends Religious Services: More than 4 times per year    Active Member of Clubs or Organizations: Yes    Attends Banker Meetings: More than 4 times per year    Marital Status: Widowed      Current Outpatient Medications:    amoxicillin  (AMOXIL ) 500 MG tablet, TAKE 4 TABLETS BY MOUTH 1 HOUR PRIOR TO DENTAL PROCEDURES, Disp: 12 tablet, Rfl: 12   apixaban  (ELIQUIS ) 5 MG TABS tablet, Take 1 tablet (5 mg total) by mouth 2 (two)  times daily., Disp: 120 tablet, Rfl: 2   Biotin 5000 MCG CAPS, Take 5,000 mcg by mouth daily., Disp: , Rfl:    Calcium  Citrate-Vitamin D  (CALCIUM  CITRATE+D3 PO), Take 1 tablet by mouth daily., Disp: , Rfl:    carboxymethylcellulose (REFRESH PLUS) 0.5 % SOLN, Place 1 drop into both eyes 3 (three) times daily as needed (Dry eye)., Disp: , Rfl:    Cholecalciferol  (VITAMIN D3 ULTRA STRENGTH) 125 MCG (5000 UT) capsule, Take 5,000 Units by mouth daily., Disp: , Rfl:    Cyanocobalamin  5000 MCG TBDP, Take 5,000 mcg by mouth daily., Disp: , Rfl:    enalapril  (VASOTEC ) 10 MG tablet, Take 1 tablet (10 mg total) by mouth daily., Disp: 90 tablet, Rfl: 3   ferrous sulfate  325 (65 FE) MG tablet, Take 325 mg by mouth daily., Disp: , Rfl:    levothyroxine  (SYNTHROID ) 150 MCG tablet, Take 1 tablet (150 mcg total) by mouth daily before breakfast. (Patient taking differently: Take 150 mcg by mouth See admin instructions. *MUST TAKE BRAND* Take  150 mg daily except skip dose on Sundays), Disp: 30 tablet, Rfl: 0   Magnesium  250 MG CAPS, Take 250 mg by mouth daily., Disp: , Rfl:    metoprolol  tartrate (LOPRESSOR ) 25 MG tablet, Take 1 tablet (25 mg total) by mouth 2 (two) times daily as needed., Disp: , Rfl:    Multiple Vitamin (MULTIVITAMIN WITH MINERALS) TABS tablet, Take 1 tablet by mouth in the morning and at bedtime. Woman 50+, Disp: , Rfl:    pantoprazole  (PROTONIX ) 40 MG tablet, Take 1 tablet (40 mg total) by mouth daily., Disp: 60 tablet, Rfl: 1   rosuvastatin  (CRESTOR ) 20 MG tablet, Take 1 tablet (20 mg total) by mouth daily., Disp: 60 tablet, Rfl: 2   valACYclovir  (VALTREX ) 1000 MG tablet, TAKE 2 TABLETS AT FIRST SIGN OF FEVER BLISTER, THEN 2 TABLETS 2 HOURSLATER, Disp: 4 tablet, Rfl: 5   Zinc 50 MG TABS, Take 50 mg by mouth daily., Disp: , Rfl:    enoxaparin  (LOVENOX ) 120 MG/0.8ML injection, Inject 0.8 mLs (120 mg total) into the skin daily. (Patient not taking: Reported on 05/23/2024), Disp: 1.6 mL, Rfl: 0    famotidine  (PEPCID ) 40 MG tablet, Take 40 mg by mouth daily., Disp: , Rfl:    sulfamethoxazole -trimethoprim  (BACTRIM  DS) 800-160 MG tablet, Patient will take 1 tablet by mouth every 12 hours starting on 04/16/2024 prior to procedure. (Patient not taking: Reported on 05/23/2024), Disp: 6 tablet, Rfl: 0  Current Facility-Administered Medications:    botulinum toxin Type A  (BOTOX ) injection 200 Units, 200 Units, Intramuscular, Once,    ciprofloxacin  (CIPRO ) tablet 500 mg, 500 mg, Oral, Once,    lidocaine  (XYLOCAINE ) 2 % (with pres) injection 200 mg, 10 mL, Other, Once,    Physical Exam:   BP 130/83 (BP Location: Left Arm, Patient Position: Sitting, Cuff Size: Large)   Pulse 73   Ht 5' 7 (1.702 m)   Wt 169 lb (76.7 kg)   SpO2 96%   BMI 26.47 kg/m   Salient findings:  CN II-XII intact - CN 7 function intact in all branches b/l, facial sensation intact b/l V1-3 to light touch Bilateral EAC clear and TM intact with well pneumatized middle ear spaces Anterior rhinoscopy: Septum intact; bilateral inferior turbinates without significant hypertrophy No lesions of oral cavity/oropharynx; dentition fair No obviously palpable neck masses/lymphadenopathy/thyromegaly EXCEPT right well demarcated, palpable, tail of parotid mass. No adenopathy noted in neck which is palpable; thyroidectomy scar well healed No respiratory distress or stridor No skin lesions noted over face or neck per my exam concerning for carcinoma  Seprately Identifiable Procedures:   None  Impression & Plans:  [name] is a 78 y.o. female with:  1. Parotid nodule    Right tail of parotid nodule noted incidentally. She is otherwise asymptomatic. We discussed path; discussed options and etiologies again - most likely benign but did discuss parotidectomy for definitive diagnosis. Discussed risks for this. She'd like to hold off for now. Advised if this grows or any other sx, to come back. Follow up 4 months.    See below  regarding exact medications prescribed this encounter including dosages and route: No orders of the defined types were placed in this encounter.     Thank you for allowing me the opportunity to care for your patient. Please do not hesitate to contact me should you have any other questions.  Sincerely, Milon Aloe, MD Otolaryngologist (ENT), [facility] Phone: [phone] Fax: [phone]  05/23/2024, 11:22 AM   I have  personally spent 31 minutes involved in face-to-face and non-face-to-face activities for this patient on the day of the visit.  Professional time spent excludes any procedures performed but includes the following activities, in addition to those noted in the documentation: preparing to see the patient (review of outside documentation and results), performing a medically appropriate examination, counseling, documenting in the electronic health record, independently interpreting results (US ).

## 2024-05-30 ENCOUNTER — Ambulatory Visit (INDEPENDENT_AMBULATORY_CARE_PROVIDER_SITE_OTHER)

## 2024-05-30 DIAGNOSIS — N3281 Overactive bladder: Secondary | ICD-10-CM | POA: Diagnosis not present

## 2024-05-30 LAB — BLADDER SCAN AMB NON-IMAGING

## 2024-05-30 NOTE — Progress Notes (Signed)
post void residual =2ml 

## 2024-06-01 NOTE — Progress Notes (Unsigned)
 Patient ID: [name] MRN: [id number] DOB/AGE: [birth year] 79 y.o.  Primary Care Physician:Cook, Jayce G, DO Primary Cardiologist: Okey Moccasin, MD  FOCUSED CARDIOVASCULAR PROBLEM LIST:   Mitral regurgitation DMR >> s/p mTEER XTW x 2 1/25 Course c/b acute CVA Monitor March 2025 >> AF >> Eliquis  PAF CV2 7 On Eliquis  CAD Mild, coronary angiography 2024 Hyperlipidemia Aortic atherosclerosis CTA head/neck 2025 Hypothyroidism Goiter status post total thyroidectomy 2012 Hypertension CKD stage II Gastric bypass 2007   HISTORY OF PRESENT ILLNESS: 11/24: The patient is a 78 y.o. female with the indicated medical history here for recommendations regarding her severe degenerative mitral regurgitation.  She had been seen previously.  She was noted to possess an incidental murmur.  She was referred for a TTE in 2023 which demonstrated possibly significant mitral regurgitation.  She is referred for a TEE in 2023 but this was unsuccessful.  She underwent EGD which cleared her for a TEE.  This was performed recently which demonstrated severe degenerative mitral regurgitation.  This demonstrated the findings as detailed above.  She does not have a history of atrial fibrillation.  The patient is limited by hip and knee issues.  She does not walk all that much.  However compared to a few years ago she has definitely noticed a change.  She will get short of breath when she goes upstairs.  She does feel like she is slowing down.  She is not short of breath at rest.  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  She walks maybe a mile and a half but typically slope pace with many stops in her working day.  She denies any heart failure symptoms, presyncope or syncope.  She does bruise easily but has not had any severe bleeding.  She denies any Abran says nocturnal dyspnea or orthopnea.  She had been hospitalized at [facility] in 2018 due to nausea and chest discomfort.  Her  evaluation there was negative.  This was thought to be due to a GI issue.  She had an echocardiogram at that time which showed only mild mitral regurgitation.  She remembers being quite sick during COVID.  She is not sure if she exactly contracted COVID.  She does not remember becoming acutely short of breath during that time.  She has not been hospitalized for acute heart failure.  She does not smoke.  She sees a Education officer, community on a regular basis.  She reports good dental health.  6/25:  Patient consents to use of AI scribe. In the interim, patient underwent mitral transcatheter edge-to-edge repair in January.  This was complicated by an acute stroke without residual deficits.  She had a monitor placed due to palpitations which did show atrial fibrillation.  She was started on anticoagulation.  She was doing well and is without cardiovascular complaints.  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 Wendel  Severe mitral insufficiency    Tachycardia     Past Surgical History:  Procedure Laterality Date   ABDOMINAL HYSTERECTOMY     BALLOON DILATION N/A 07/17/2023   Procedure: BALLOON DILATION;  Surgeon: Cindie Carlin POUR, DO;  Location: [facility];  Service: Endoscopy;  Laterality: N/A;   BILATERAL SALPINGOOPHORECTOMY     BREAST EXCISIONAL BIOPSY Bilateral 1995   benign   CHOLECYSTECTOMY     COLONOSCOPY  11/14/2011   Procedure: COLONOSCOPY;  Surgeon: Lamar CHRISTELLA Hollingshead, MD;  Location: [facility];  Service: Endoscopy;   Laterality: N/A;   ESOPHAGOGASTRODUODENOSCOPY (EGD) WITH PROPOFOL  N/A 07/17/2023   Procedure: ESOPHAGOGASTRODUODENOSCOPY (EGD) WITH L;  Surgeon: Cindie Carlin POUR, DO;  Location: [facility];  Service: Endoscopy;  Laterality: N/A;   REPLACEMENT TOTAL KNEE BILATERAL     RIGHT/LEFT HEART CATH AND CORONARY ANGIOGRAPHY N/A 11/16/2023   Procedure: RIGHT/LEFT HEART CATH AND CORONARY ANGIOGRAPHY;  Surgeon: Wendel Lurena POUR, MD;  Location: [facility] INVAS[facility] CV LAB;  Service: Cardiovascular;  Laterality: N/A;   ROUX-EN-Y PROCEDURE  2007   TEE WITHOUT CARDIOVERSION N/A 11/11/2022   Procedure: TRANSESOPHAGEAL ECHOCARDIOGRAM (TEE);  Surgeon: Mallipeddi, Vishnu P, MD;  Location: [facility] ORS;  Service: Cardiovascular;  Laterality: N/A;   TONSILLECTOMY     TOTAL HIP ARTHROPLASTY Right 12/23/2019   Procedure: RIGHT TOTAL HIP ARTHROPLASTY ANTERIOR APPROACH;  Surgeon: Jerri Kay CHRISTELLA, MD;  Location: [facility] OR;  Service: Orthopedics;  Laterality: Right;   TOTAL HIP ARTHROPLASTY Left 11/21/2022   Procedure: LEFT TOTAL HIP ARTHROPLASTY ANTERIOR APPROACH;  Surgeon: Jerri Kay CHRISTELLA, MD;  Location: [facility] OR;  Service: Orthopedics;  Laterality: Left;  3-C   TOTAL THYROIDECTOMY  12/2010   TRANSCATHETER MITRAL EDGE TO EDGE REPAIR N/A 01/03/2024   Procedure: TRANSCATHETER MITRAL EDGE TO EDGE REPAIR;  Surgeon: Wendel Lurena POUR, MD;  Location: [facility];  Service: Cardiovascular;  Laterality: N/A;   TRANSESOPHAGEAL ECHOCARDIOGRAM (CATH LAB) N/A 10/27/2023   Procedure: TRANSESOPHAGEAL ECHOCARDIOGRAM;  Surgeon: Delford Maude BROCKS, MD;  Location: [facility] INVASIVE CV LAB;  Service: Cardiovascular;  Laterality: N/A;   TRANSESOPHAGEAL ECHOCARDIOGRAM (CATH LAB) N/A 01/03/2024   Procedure: TRANSESOPHAGEAL ECHOCARDIOGRAM;  Surgeon: Wendel Lurena POUR, MD;  Location: [facility] INVASIVE CV LAB;  Service: Cardiovascular;  Laterality: N/A;   TUBAL LIGATION      Family History  Problem Relation Age of Onset   Breast cancer Mother    Pneumonia Mother 39   CAD  Father 35   Breast cancer Sister    CAD Sister 55   Other Maternal Grandmother 5   CAD Brother 28   Other Brother 71       drug overdose   CAD Other 32   Colon cancer Neg Hx     Social History   Socioeconomic History   Marital status: Widowed    Spouse name: Not on file   Number of children: Not on file   Years of education: Not on file   Highest education level: 12th grade  Occupational History   Occupation: Retired Clinical biochemist  Tobacco Use   Smoking status: Never   Smokeless tobacco: Never  Vaping Use   Vaping status: Never Used  Substance and Sexual Activity   Alcohol use: No   Drug use: No   Sexual activity: Not on file  Other Topics Concern   Not on file  Social History Narrative   Not on file   Social Drivers of Health   Financial Resource Strain: Low Risk  (03/15/2024)  Overall Financial Resource Strain (CARDIA)    Difficulty of Paying Living Expenses: Not hard at all  Food Insecurity: No Food Insecurity (03/15/2024)   Hunger Vital Sign    Worried About Running Out of Food in the Last Year: Never true    Ran Out of Food in the Last Year: Never true  Transportation Needs: No Transportation Needs (03/15/2024)   PRAPARE - Administrator, Civil Service (Medical): No    Lack of Transportation (Non-Medical): No  Physical Activity: Unknown (03/15/2024)   Exercise Vital Sign    Days of Exercise per Week: 2 days    Minutes of Exercise per Session: Patient declined  Stress: No Stress Concern Present (03/15/2024)   [employer] of Occupational Health - Occupational Stress Questionnaire    Feeling of Stress : Not at all  Social Connections: Moderately Integrated (03/15/2024)   Social Connection and Isolation Panel    Frequency of Communication with Friends and Family: More than three times a week    Frequency of Social Gatherings with Friends and Family: Three times a week    Attends Religious Services: More than 4 times per year    Active Member of  Clubs or Organizations: Yes    Attends Banker Meetings: More than 4 times per year    Marital Status: Widowed  Intimate Partner Violence: Not At Risk (01/08/2024)   Humiliation, Afraid, Rape, and Kick questionnaire    Fear of Current or Ex-Partner: No    Emotionally Abused: No    Physically Abused: No    Sexually Abused: No     Prior to Admission medications   Medication Sig Start Date End Date Taking? Authorizing Provider  amoxicillin  (AMOXIL ) 500 MG capsule Take 2,000 mg by mouth See admin instructions. Take 4 capsules (2000 mg) by mouth 1 hour prior to dental appointments    [provider]  Biotin 5000 MCG CAPS Take 5,000 mcg by mouth daily.    [provider]  Calcium  Citrate-Vitamin D  (CALCIUM  CITRATE+D3 PO) Take 1 tablet by mouth daily.    [provider]  carboxymethylcellulose (REFRESH PLUS) 0.5 % SOLN Place 1 drop into both eyes in the morning and at bedtime.    [provider]  Cholecalciferol  (VITAMIN D3 ULTRA STRENGTH) 125 MCG (5000 UT) capsule Take 5,000 Units by mouth daily.    [provider]  cyanocobalamin  2000 MCG tablet Take 2,000 mcg by mouth daily.    [provider]  enalapril  (VASOTEC ) 10 MG tablet Take 1 tablet (10 mg total) by mouth daily. Patient taking differently: Take 10 mg by mouth at bedtime. 09/19/23   Cook, Jayce G, DO  famotidine  (PEPCID ) 40 MG tablet Take 1 tablet (40 mg total) by mouth daily. 09/19/23   Cook, Jayce G, DO  ferrous sulfate  325 (65 FE) MG tablet Take 325 mg by mouth daily.    [provider]  levothyroxine  (SYNTHROID ) 150 MCG tablet Take 1 tablet (150 mcg total) by mouth daily before breakfast. Patient taking differently: Take 150 mcg by mouth See admin instructions. Take 150 mg daily except skip dose on Sundays 12/29/16   Dolan Mateo Larger, MD  Magnesium  250 MG CAPS Take 250 mg by mouth daily.    [provider]  Multiple Vitamin (MULTIVITAMIN WITH  MINERALS) TABS tablet Take 1 tablet by mouth in the morning and at bedtime.    [provider]  valACYclovir  (VALTREX ) 1000 MG tablet TAKE 2 TABLETS AT FIRST SIGN OF  FEVER BLISTER, THEN 2 TABLETS 2 HOURSLATER Patient taking differently: Take 2,000 mg by mouth 2 (two) times daily as needed (fever blisters.). 05/11/22   Cook, Jayce G, DO  Vibegron  (GEMTESA ) 75 MG TABS Take 75 mg by mouth daily. 08/07/23   [provider]  Zinc 50 MG TABS Take 50 mg by mouth daily.    [provider]    Allergies  Allergen Reactions   Codeine Nausea And Vomiting   Demerol  Nausea And Vomiting   Nsaids Other (See Comments)    Due to gastric bypass    REVIEW OF SYSTEMS:  General: no fevers/chills/night sweats Eyes: no blurry vision, diplopia, or amaurosis ENT: no sore throat or hearing loss Resp: no cough, wheezing, or hemoptysis CV: no edema or palpitations GI: no abdominal pain, nausea, vomiting, diarrhea, or constipation GU: no dysuria, frequency, or hematuria Skin: no rash Neuro: no headache, numbness, tingling, or weakness of extremities Musculoskeletal: no joint pain or swelling Heme: no bleeding, DVT, or easy bruising Endo: no polydipsia or polyuria  There were no vitals taken for this visit.  PHYSICAL EXAM: GEN:  AO x 3 in no acute distress HEENT: normal Dentition: Some missing teeth Neck: JVP normal. +2 carotid upstrokes without bruits. No thyromegaly. Lungs: equal expansion, clear bilaterally CV: Apex is discrete and nondisplaced, RRR with 4/6 holosystolic murmur throughout precordium Abd: soft, non-tender, non-distended; no bruit; positive bowel sounds Ext: no edema, ecchymoses, or cyanosis Vascular: 2+ femoral pulses, 2+ radial pulses       Skin: warm and dry without rash Neuro: CN II-XII grossly intact; motor and sensory grossly intact    DATA AND STUDIES:  EKG: EKG 2025 Sinus bradycardia  EKG Interpretation Date/Time:    Ventricular Rate:    PR  Interval:    QRS Duration:    QT Interval:    QTC Calculation:   R Axis:      Text Interpretation:          Cardiac Studies & Procedures   ______________________________________________________________________________________________ CARDIAC CATHETERIZATION  CARDIAC CATHETERIZATION 11/16/2023  Conclusion 1.  Minimal nonobstructive coronary artery disease. 2.  Fick cardiac output of 7.2 L/min and Fick cardiac index of 3.8 L/min/m with the following hemodynamics: Right atrial pressure mean of 6 mmHg Right ventricular pressure 33/1 with an end-diastolic pressure of 11 mmHg Wedge pressure mean of 13 mmHg with V waves to 18 mmHg PA pressure 34/9 with a mean of 21 mmHg PVR of 1.1 Woods units PA pulsatility index of over 4 3.  LVEDP of 12 mmHg.  Recommendation: Continue evaluation for mitral valve intervention.  Findings Coronary Findings Diagnostic  Dominance: Right  Left Anterior Descending The vessel exhibits minimal luminal irregularities.  Left Circumflex The vessel exhibits minimal luminal irregularities.  Intervention  No interventions have been documented.   STRESS TESTS  NM MYOCAR MULTI W/SPECT W 12/29/2016  Narrative  There was no ST segment deviation noted during stress.  The study is normal. Inferior defect most consistent with subdiaphragmatic attenuation and gut radiotracer uptake artifact as opposed to true inferior wall ischemia.  This is a low risk study.  The left ventricular ejection fraction is normal (55-65%).   ECHOCARDIOGRAM  ECHOCARDIOGRAM COMPLETE 02/07/2024  Narrative ECHOCARDIOGRAM REPORT    Patient Name:   Shyia M Hennes Date of Exam: 02/07/2024 Medical Rec #:  [id number]         Height:       67.0 in Accession #:    [id number]  Weight:       166.0 lb Date of Birth:  06-Nov-1946         BSA:          1.868 m Patient Age:    77 years          BP:           138/88 mmHg Patient Gender: F                 HR:           60  bpm. Exam Location:  [address]  Procedure: 2D Echo, Cardiac Doppler, Color Doppler and 3D Echo (Both Spectral and Color Flow Doppler were utilized during procedure).  Indications:    Mitral valve insufficiency I34.0; S/P Mitral Valve Clip implantation S01.109  History:        Patient has prior history of Echocardiogram examinations, most recent 01/04/2024. Signs/Symptoms:Murmur; Risk Factors:Hypertension.  Mitral Valve: Mitral-Clip XTW (A2-P2 medial near A3/P3 and lateral A2/P2) is present in the mitral position. Procedure Date: 01/03/2024.  Sonographer:    Augustin Seals RDCS Referring Phys: [id number] Cynthia Stainback [facility]  IMPRESSIONS   1. The mitral valve has been repaired with transcatheter edge to edge repair. s/p 2 Mitral-Clip XTW (A2-P2 medial near A3/P3 and lateral A2/P2) present in the mitral position. Clips are well seated and remain in good position. MG 4 mmHg at HR 61 bpm. Findings overall suggest normal appearance of MV repair with mild residual MR. Procedure Date: 01/03/2024. Mild mitral valve regurgitation. 2. Left ventricular ejection fraction, by estimation, is 65 to 70%. Left ventricular ejection fraction by 3D volume is 63 %. The left ventricle has normal function. The left ventricle has no regional wall motion abnormalities. There is mild left ventricular hypertrophy. Left ventricular diastolic parameters are indeterminate. 3. Right ventricular systolic function is normal. The right ventricular size is normal. There is normal pulmonary artery systolic pressure. The estimated right ventricular systolic pressure is 23.8 mmHg. 4. Left atrial size was severely dilated. 5. Right atrial size was mildly dilated. 6. The aortic valve is tricuspid. There is mild calcification of the aortic valve. There is mild thickening of the aortic valve. Aortic valve regurgitation is trivial. No aortic stenosis is present. 7. The inferior vena cava is normal in size with greater than 50%  respiratory variability, suggesting right atrial pressure of 3 mmHg.  FINDINGS Left Ventricle: Left ventricular ejection fraction, by estimation, is 65 to 70%. Left ventricular ejection fraction by 3D volume is 63 %. The left ventricle has normal function. The left ventricle has no regional wall motion abnormalities. The left ventricular internal cavity size was normal in size. There is mild left ventricular hypertrophy. Left ventricular diastolic function could not be evaluated due to mitral valve repair. Left ventricular diastolic parameters are indeterminate.  Right Ventricle: The right ventricular size is normal. No increase in right ventricular wall thickness. Right ventricular systolic function is normal. There is normal pulmonary artery systolic pressure. The tricuspid regurgitant velocity is 2.28 m/s, and with an assumed right atrial pressure of 3 mmHg, the estimated right ventricular systolic pressure is 23.8 mmHg.  Left Atrium: Left atrial size was severely dilated.  Right Atrium: Right atrial size was mildly dilated.  Pericardium: There is no evidence of pericardial effusion.  Mitral Valve: The mitral valve has been repaired/replaced. Mild mitral valve regurgitation. There is a XTW (A2-P2 medial near A3/P3 and lateral A2/P2) Mitra-Clip present in the mitral position. Procedure Date: 01/03/2024. MV peak gradient,  10.1 mmHg. The mean mitral valve gradient is 4.0 mmHg with average heart rate of 61 bpm.  Tricuspid Valve: The tricuspid valve is normal in structure. Tricuspid valve regurgitation is mild . No evidence of tricuspid stenosis.  Aortic Valve: The aortic valve is tricuspid. There is mild calcification of the aortic valve. There is mild thickening of the aortic valve. Aortic valve regurgitation is trivial. Aortic regurgitation PHT measures 549 msec. No aortic stenosis is present.  Pulmonic Valve: The pulmonic valve was normal in structure. Pulmonic valve regurgitation is trivial.  No evidence of pulmonic stenosis.  Aorta: The aortic root is normal in size and structure.  Venous: The inferior vena cava is normal in size with greater than 50% respiratory variability, suggesting right atrial pressure of 3 mmHg.  IAS/Shunts: No atrial level shunt detected by color flow Doppler.  Additional Comments: 3D was performed not requiring image post processing on an independent workstation and was normal.   LEFT VENTRICLE PLAX 2D LVIDd:         4.40 cm         Diastology LVIDs:         2.80 cm         LV e' medial:    5.27 cm/s LV PW:         1.10 cm         LV E/e' medial:  25.8 LV IVS:        1.10 cm         LV e' lateral:   6.65 cm/s LVOT diam:     2.30 cm         LV E/e' lateral: 20.5 LV SV:         142 LV SV Index:   76 LVOT Area:     4.15 cm        3D Volume EF LV 3D EF:    Left ventricul ar ejection fraction by 3D volume is 63 %.  3D Volume EF: 3D EF:        63 % LV EDV:       120 ml LV ESV:       45 ml LV SV:        75 ml  RIGHT VENTRICLE             IVC RV Basal diam:  2.90 cm     IVC diam: 1.40 cm RV Mid diam:    2.80 cm RV S prime:     10.10 cm/s TAPSE (M-mode): 2.0 cm  LEFT ATRIUM             Index        RIGHT ATRIUM           Index LA diam:        4.70 cm 2.52 cm/m   RA Area:     15.60 cm LA Vol (A2C):   93.2 ml 49.89 ml/m  RA Volume:   31.90 ml  17.07 ml/m LA Vol (A4C):   78.7 ml 42.12 ml/m LA Biplane Vol: 91.4 ml 48.92 ml/m AORTIC VALVE LVOT Vmax:   147.00 cm/s LVOT Vmean:  96.300 cm/s LVOT VTI:    0.341 m AI PHT:      549 msec  AORTA Ao Root diam: 3.30 cm Ao Asc diam:  3.60 cm  MITRAL VALVE                TRICUSPID VALVE MV Area (PHT): 2.17 cm  TR Peak grad:   20.8 mmHg MV Area VTI:   2.55 cm     TR Vmax:        228.00 cm/s MV Peak grad:  10.1 mmHg MV Mean grad:  4.0 mmHg     SHUNTS MV Vmax:       1.59 m/s     Systemic VTI:  0.34 m MV Vmean:      94.8 cm/s    Systemic Diam: 2.30 cm MV Decel Time: 349 msec MR Peak  grad: 140.7 mmHg MR Mean grad: 94.0 mmHg MR Vmax:      593.00 cm/s MR Vmean:     458.0 cm/s MV E velocity: 136.00 cm/s MV A velocity: 156.00 cm/s MV E/A ratio:  0.87  Soyla Merck MD Electronically signed by Soyla Merck MD Signature Date/Time: 02/07/2024/3:22:25 PM    Final   TEE  ECHO TEE 01/03/2024  Narrative TRANSESOPHOGEAL ECHO REPORT    Patient Name:   TARAN HABLE Date of Exam: 01/03/2024 Medical Rec #:  [id number]         Height:       67.0 in Accession #:    [id number]        Weight:       166.0 lb Date of Birth:  Jul 14, 1946         BSA:          1.868 m Patient Age:    77 years          BP:           151/80 mmHg Patient Gender: F                 HR:           62 bpm. Exam Location:  Inpatient  Procedure: Transesophageal Echo, 3D Echo, Cardiac Doppler and Color Doppler  Indications:     MitraClip  History:         Patient has prior history of Echocardiogram examinations, most recent 10/27/2023. Mitral Valve Disease, Signs/Symptoms:Murmur; Risk Factors:Hypertension.  Sonographer:     Jayson Gaskins Referring Phys:  [id number] Javari Bufkin K Shayli Altemose Diagnosing Phys: Stanly Leavens MD  PROCEDURE: After discussion of the risks and benefits of a TEE, an informed consent was obtained from the patient. The transesophogeal probe was passed without difficulty through the esophogus of the patient. Sedation performed by different physician. The patient developed no complications during the procedure. Gingival bleeding with chipped tooth prior to probe placement. No change in dentition post procecure. Bleeding stopped prior to heparinzation.  IMPRESSIONS   1. Prior to procedure- bi-leaflet prolapse with posterior flail and severe mitral regurgitation. Systolic blunting of pulmonary vein flow and post intubation loading conditions. Sufficiency height and leaflet length. Mean gradient of 1 mmHg with mitral valve area 6.14 cm2. Suitable for mTEER. During procedure,  difficult transeptal puncture. Successful inferior, posterior puncture on the thin portion of the septum. A MitraClip XTW was placed medial to the A2/P2 position with 3D support. Mild mitral regurgitation was left with a double orifice area of 6.36 cm2. Mean gradient of 3. A second XTW clip was placed parallel to the first clip at the A2/P2 position with 3D support. Mean gradient of 6 mmHg. Trivial MR. LVOT VTI has doubled. Normalization of pulmonary vein flow in the left and right sided veins. After the procedure left to right ASD flow noted. Trival pericardial effusion is unchanged. Succesfull mTEER placement.. The mitral valve has been repaired/replaced. Severe mitral valve regurgitation. 2.  Left ventricular ejection fraction, by estimation, is 55 to 60%. The left ventricle has normal function. 3. Right ventricular systolic function is normal. The right ventricular size is normal. 4. Left atrial size was moderately dilated. No left atrial/left atrial appendage thrombus was detected. 5. Right atrial size was moderately dilated. 6. The aortic valve is tricuspid. Aortic valve regurgitation is trivial. No aortic stenosis is present. 7. 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 8. Evidence of atrial level shunting detected by color flow Doppler.  FINDINGS Left Ventricle: Left ventricular ejection fraction, by estimation, is 55 to 60%. The left ventricle has normal function. The left ventricular internal cavity size was normal in size.  Right Ventricle: The right ventricular size is normal. No increase in right ventricular wall thickness. Right ventricular systolic function is normal.  Left Atrium: Left atrial size was moderately dilated. No left atrial/left atrial appendage thrombus was detected.  Right Atrium: Right atrial size was moderately dilated.  Pericardium: Trivial pericardial effusion is present. The pericardial effusion is circumferential.  Mitral Valve:  Prior to procedure- bi-leaflet prolapse with posterior flail and severe mitral regurgitation. Systolic blunting of pulmonary vein flow and post intubation loading conditions. Sufficiency height and leaflet length. Mean gradient of 1 mmHg with mitral valve area 6.14 cm2. Suitable for mTEER. During procedure, difficult transeptal puncture. Successful inferior, posterior puncture on the thin portion of the septum. A MitraClip XTW was placed medial to the A2/P2 position with 3D support. Mild mitral regurgitation was left with a double orifice area of 6.36 cm2. Mean gradient of 3. A second XTW clip was placed parallel to the first clip at the A2/P2 position with 3D support. Mean gradient of 6 mmHg. Trivial MR. LVOT VTI has doubled. Normalization of pulmonary vein flow in the left and right sided veins. After the procedure left to right ASD flow noted. Trival pericardial effusion is unchanged. Succesfull mTEER placement. The mitral valve has been repaired/replaced. Severe mitral valve regurgitation. MV peak gradient, 6.8 mmHg. The mean mitral valve gradient is 3.5 mmHg.  Tricuspid Valve: The tricuspid valve is normal in structure. Tricuspid valve regurgitation is mild . No evidence of tricuspid stenosis.  Aortic Valve: The aortic valve is tricuspid. Aortic valve regurgitation is trivial. No aortic stenosis is present.  Pulmonic Valve: The pulmonic valve was not well visualized. Pulmonic valve regurgitation is not visualized.  Aorta: The aortic root, ascending aorta and aortic arch are all structurally normal, with no evidence of dilitation or obstruction. There is moderate (Grade III) plaque involving the ascending aorta.  IAS/Shunts: Evidence of atrial level shunting detected by color flow Doppler.   AORTIC VALVE LVOT Vmax:   117.25 cm/s LVOT Vmean:  85.325 cm/s LVOT VTI:    0.252 m  MITRAL VALVE MV Peak grad: 6.8 mmHg  SHUNTS MV Mean grad: 3.5 mmHg  Systemic VTI: 0.25 m MV Vmax:      1.30  m/s MV Vmean:     85.0 cm/s  Stanly Leavens MD Electronically signed by Stanly Leavens MD Signature Date/Time: 01/03/2024/12:55:18 PM    Final  MONITORS  CARDIAC EVENT MONITOR 02/15/2024  Narrative 1.  Total monitoring of 30 days. 2.  Average heart rate of 67 bpm ranging from 50 to 157 bpm. 3.  Predominant rhythm was sinus rhythm 4.  No sustained ventricular tachyarrhythmias, or bradyarrhythmias were detected. 5.  Less than 1% burden of PVCs and 2% burden of PACs. 6.  2 episodes of supraventricular Tachycardia with the longest lasting 7  seconds. 7.  Atrial fibrillation was noted on February 27 lasting 49 minutes.  Summary: Atrial fibrillation noted.       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 Paroxysmal atrial fibrillation: Continue Eliquis  5 mg twice daily, Lopressor  25 mg twice daily Secondary hypercoagulable state: Continue Eliquis  5 mg twice daily Mild CAD: Continue Eliquis  5 mg twice daily, increase rosuvastatin  to 40 mg as below. Hyperlipidemia: LDL in January 1992 not at goal; increase rosuvastatin  to 40 mg and check lipid panel, LFTs, and LP(a) in 2 months.*** Aortic atherosclerosis: Continue Eliquis  5 mg twice daily rosuvastatin . Hypertension: Continue lisinopril 10 mg daily, Lopressor  25 mg twice daily CKD stage II: Continue  lisinopril 10 mg daily and start Jardiance 10 mg daily.***   I spent *** minutes reviewing all clinical data during and prior to this visit including all relevant imaging studies, laboratories, clinical information from other health systems and prior notes from both Cardiology and other specialties, interviewing the patient, conducting a complete physical examination, and coordinating care in order to formulate a comprehensive and personalized evaluation and treatment plan.   Ellean Firman K Townes Fuhs, MD  06/01/2024 8:59 AM    [facility] [address] [place], [place], [place]  [address] Phone: [phone]; Fax: [phone]

## 2024-06-06 ENCOUNTER — Ambulatory Visit: Payer: Medicare HMO | Attending: Internal Medicine | Admitting: Internal Medicine

## 2024-06-06 ENCOUNTER — Encounter: Payer: Self-pay | Admitting: Internal Medicine

## 2024-06-06 ENCOUNTER — Telehealth: Payer: Self-pay | Admitting: *Deleted

## 2024-06-06 VITALS — BP 154/82 | HR 78 | Ht 67.0 in | Wt 172.0 lb

## 2024-06-06 DIAGNOSIS — I700 Atherosclerosis of aorta: Secondary | ICD-10-CM | POA: Diagnosis not present

## 2024-06-06 DIAGNOSIS — I480 Paroxysmal atrial fibrillation: Secondary | ICD-10-CM | POA: Diagnosis not present

## 2024-06-06 DIAGNOSIS — H5340 Unspecified visual field defects: Secondary | ICD-10-CM | POA: Diagnosis not present

## 2024-06-06 DIAGNOSIS — I2510 Atherosclerotic heart disease of native coronary artery without angina pectoris: Secondary | ICD-10-CM

## 2024-06-06 DIAGNOSIS — I10 Essential (primary) hypertension: Secondary | ICD-10-CM | POA: Diagnosis not present

## 2024-06-06 DIAGNOSIS — Z9889 Other specified postprocedural states: Secondary | ICD-10-CM

## 2024-06-06 DIAGNOSIS — N182 Chronic kidney disease, stage 2 (mild): Secondary | ICD-10-CM | POA: Diagnosis not present

## 2024-06-06 DIAGNOSIS — E785 Hyperlipidemia, unspecified: Secondary | ICD-10-CM

## 2024-06-06 DIAGNOSIS — Z95818 Presence of other cardiac implants and grafts: Secondary | ICD-10-CM

## 2024-06-06 DIAGNOSIS — D6869 Other thrombophilia: Secondary | ICD-10-CM | POA: Diagnosis not present

## 2024-06-06 MED ORDER — ROSUVASTATIN CALCIUM 40 MG PO TABS: 40.0000 mg | ORAL_TABLET | Freq: Every day | ORAL | 3 refills | Status: AC

## 2024-06-06 MED ORDER — EMPAGLIFLOZIN 10 MG PO TABS: 10.0000 mg | ORAL_TABLET | Freq: Every day | ORAL | 5 refills | Status: DC

## 2024-06-06 MED ORDER — EMPAGLIFLOZIN 10 MG PO TABS: 10.0000 mg | ORAL_TABLET | Freq: Every day | ORAL | 0 refills | Status: DC

## 2024-06-06 NOTE — Patient Instructions (Signed)
 Medication Instructions:  Your physician has recommended you make the following change in your medication:  1.) Jardiance 10 mg - take one tablet daily before breakfast 2.) increase rosuvastatin  (Crestor ) to 40 mg - one tablet daily  *If you need a refill on your cardiac medications before your next appointment, please call your pharmacy*  Lab Work: Return in about 8 weeks for blood work (lipids, liver, Lp(a)  If you have labs (blood work) drawn today and your tests are completely normal, you will receive your results only by: MyChart Message (if you have MyChart) OR A paper copy in the mail If you have any lab test that is abnormal or we need to change your treatment, we will call you to review the results.  Testing/Procedures: none  Follow-Up: At [facility], you and your health needs are our priority.  As part of our continuing mission to provide you with exceptional heart care, our providers are all part of one team.  This team includes your primary Cardiologist (physician) and Advanced Practice Providers or APPs (Physician Assistants and Nurse Practitioners) who all work together to provide you with the care you need, when you need it.  Your next appointment:   6 month(s)  Provider:   Arun K Thukkani, MD

## 2024-06-06 NOTE — Telephone Encounter (Signed)
 Jardiance 10 mg by mouth daily samples is given to patient.

## 2024-06-06 NOTE — Telephone Encounter (Signed)
 Medication name/dosage: Samples List: Jardiance 10 mg  Administration instructions: one tablet daily before breakfast  Reason for samples: Reason for samples: new start  Ordering provider: Lurena Red, MD  *Once above information entered, route the phone encounter to CV DIV MAG ST SAMPLES and send Teams message to team member assigned to Samples for the day.

## 2024-07-03 NOTE — Progress Notes (Unsigned)
 Name: [name] DOB: 12-05-1946 MRN: [id number]  History of Present Illness: [name] is a 78 y.o. female who presents today for follow up visit at [facility] [place].  Relevant History includes: 1. OAB with urinary frequency, urgency, and urge incontinence. - Neurogenic risk factors: prior stroke. - Exacerbating factors: glucosuria (due to Jardiance  use). - Failed Myrbetriq , Gemtesa , Oxybutynin . - 06/23/2023: Had cystoscopy with bladder Botox  (100 units) by Dr. Sherrilee.   At last visit with Dr. Sherrilee on 05/08/2024: Office cystoscopy with bladder Botox  (100 units).   Since last visit: > 05/30/2024: Urology nurse visit. PVR = 2 ml.  Today: She {Actions; denies-reports:120008} symptomatic improvement since the Botox  procedure on 05/08/2024.  She reports {Blank multiple:19197::improved,persistent / unchanged} urinary ***frequency, ***nocturia x***, ***urgency, and ***urge incontinence.  Voiding ***x/day and ***x/night on average.  Leaking ***x/day on average; using *** ***pads / ***diapers per day on average.  She {Actions; denies-reports:120008} significant caffeine intake (*** caffeinated beverages per day on average).  She {Actions; denies-reports:120008} dysuria, gross hematuria, straining to void, or sensations of incomplete emptying.   Medications: Current Outpatient Medications  Medication Sig Dispense Refill   amoxicillin  (AMOXIL ) 500 MG tablet TAKE 4 TABLETS BY MOUTH 1 HOUR PRIOR TO DENTAL PROCEDURES 12 tablet 12   apixaban  (ELIQUIS ) 5 MG TABS tablet Take 1 tablet (5 mg total) by mouth 2 (two) times daily. 120 tablet 2   Biotin 5000 MCG CAPS Take 5,000 mcg by mouth daily.     Calcium  Citrate-Vitamin D  (CALCIUM  CITRATE+D3 PO) Take 1 tablet by mouth daily.     carboxymethylcellulose (REFRESH PLUS) 0.5 % SOLN Place 1 drop into both eyes 3 (three) times daily as needed (Dry eye).     Cholecalciferol  (VITAMIN D3 ULTRA STRENGTH) 125 MCG (5000 UT)  capsule Take 5,000 Units by mouth daily.     Cyanocobalamin  5000 MCG TBDP Take 5,000 mcg by mouth daily.     empagliflozin  (JARDIANCE ) 10 MG TABS tablet Take 1 tablet (10 mg total) by mouth daily before breakfast. 30 tablet 5   empagliflozin  (JARDIANCE ) 10 MG TABS tablet Take 1 tablet (10 mg total) by mouth daily before breakfast. 28 tablet 0   enalapril  (VASOTEC ) 10 MG tablet Take 1 tablet (10 mg total) by mouth daily. 90 tablet 3   ferrous sulfate  325 (65 FE) MG tablet Take 325 mg by mouth daily.     levothyroxine  (SYNTHROID ) 150 MCG tablet Take 1 tablet (150 mcg total) by mouth daily before breakfast. 30 tablet 0   Magnesium  250 MG CAPS Take 250 mg by mouth daily.     metoprolol  tartrate (LOPRESSOR ) 25 MG tablet Take 1 tablet (25 mg total) by mouth 2 (two) times daily as needed.     Multiple Vitamin (MULTIVITAMIN WITH MINERALS) TABS tablet Take 1 tablet by mouth in the morning and at bedtime. Woman 50+     pantoprazole  (PROTONIX ) 40 MG tablet Take 1 tablet (40 mg total) by mouth daily. 60 tablet 1   rosuvastatin  (CRESTOR ) 40 MG tablet Take 1 tablet (40 mg total) by mouth daily. 90 tablet 3   valACYclovir  (VALTREX ) 1000 MG tablet TAKE 2 TABLETS AT FIRST SIGN OF FEVER BLISTER, THEN 2 TABLETS 2 HOURSLATER 4 tablet 5   Zinc 50 MG TABS Take 50 mg by mouth daily.     Current Facility-Administered Medications  Medication Dose Route Frequency Provider Last Rate Last Admin   botulinum toxin Type A  (BOTOX ) injection 200 Units  200 Units Intramuscular Once  ciprofloxacin  (CIPRO ) tablet 500 mg  500 mg Oral Once        lidocaine  (XYLOCAINE ) 2 % (with pres) injection 200 mg  10 mL Other Once         Allergies: Allergies  Allergen Reactions   Codeine Nausea And Vomiting   Demerol  Nausea And Vomiting   Nsaids Other (See Comments)    Due to gastric bypass    Past Medical History:  Diagnosis Date   Arthritis    Cancer (HCC)    Thyroid  cancer   COVID-19 virus infection 12/2020   Family  history of adverse reaction to anesthesia    mother couldn't wake up good and would throw up   GERD (gastroesophageal reflux disease)    Heart murmur    History of kidney stones    Hypercholesteremia    resolved after gastric bypass   Hypertension    Hypothyroidism    Pneumonia 11/22/2023   LLL   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 Wendel   Severe mitral insufficiency    Tachycardia    Past Surgical History:  Procedure Laterality Date   ABDOMINAL HYSTERECTOMY     BALLOON DILATION N/A 07/17/2023   Procedure: BALLOON DILATION;  Surgeon: Cindie Carlin POUR, DO;  [facility]: [facility];  Service: Endoscopy;  Laterality: N/A;   BILATERAL SALPINGOOPHORECTOMY     BREAST EXCISIONAL BIOPSY Bilateral 1995   benign   CHOLECYSTECTOMY     COLONOSCOPY  11/14/2011   Procedure: COLONOSCOPY;  Surgeon: Lamar CHRISTELLA Hollingshead, MD;  [facility]: [facility];  Service: Endoscopy;  Laterality: N/A;   ESOPHAGOGASTRODUODENOSCOPY (EGD) WITH PROPOFOL  N/A 07/17/2023   Procedure: ESOPHAGOGASTRODUODENOSCOPY (EGD) WITH L;  Surgeon: Cindie Carlin POUR, DO;  [facility]: [facility];  Service: Endoscopy;  Laterality: N/A;   REPLACEMENT TOTAL KNEE BILATERAL     RIGHT/LEFT HEART CATH AND CORONARY ANGIOGRAPHY N/A 11/16/2023   Procedure: RIGHT/LEFT HEART CATH AND CORONARY ANGIOGRAPHY;  Surgeon: Wendel Lurena POUR, MD;  Location: [facility] IN[facility]AS[facility] CV LAB;  Service: Cardiovascular;  Laterality: N/A;   ROUX-EN-Y PROCEDURE  2007   TEE WITHOUT CARDIOVERSION N/A 11/11/2022   Procedure: TRANSESOPHAGEAL ECHOCARDIOGRAM (TEE);  Surgeon: Mallipeddi, Vishnu P, MD;  Location: [facility];  Service: Cardiovascular;  Laterality: N/A;   TONSILLECTOMY     TOTAL HIP ARTHROPLASTY Right 12/23/2019   Procedure: RIGHT TOTAL HIP ARTHROPLASTY ANTERIOR APPROACH;  Surgeon: Jerri Kay CHRISTELLA, MD;  Location: [facility] OR;  Service: Orthopedics;  Laterality: Right;   TOTAL HIP ARTHROPLASTY Left 11/21/2022   Procedure: LEFT TOTAL  HIP ARTHROPLASTY ANTERIOR APPROACH;  Surgeon: Jerri Kay CHRISTELLA, MD;  Location: [facility] OR;  Service: Orthopedics;  Laterality: Left;  3-C   TOTAL THYROIDECTOMY  12/2010   TRANSCATHETER MITRAL EDGE TO EDGE REPAIR N/A 01/03/2024   Procedure: TRANSCATHETER MITRAL EDGE TO EDGE REPAIR;  Surgeon: Wendel Lurena POUR, MD;  Location: [facility];  Service: Cardiovascular;  Laterality: N/A;   TRANSESOPHAGEAL ECHOCARDIOGRAM (CATH LAB) N/A 10/27/2023   Procedure: TRANSESOPHAGEAL ECHOCARDIOGRAM;  Surgeon: Delford Maude BROCKS, MD;  Location: [facility] INVASIVE CV LAB;  Service: Cardiovascular;  Laterality: N/A;   TRANSESOPHAGEAL ECHOCARDIOGRAM (CATH LAB) N/A 01/03/2024   Procedure: TRANSESOPHAGEAL ECHOCARDIOGRAM;  Surgeon: Wendel Lurena POUR, MD;  Location: [facility] INVASIVE CV LAB;  Service: Cardiovascular;  Laterality: N/A;   TUBAL LIGATION     Family History  Problem Relation Age of Onset   Breast cancer Mother    Pneumonia Mother 25   CAD  Father 87   Breast cancer Sister    CAD Sister 49   Other Maternal Grandmother 76   CAD Brother 46   Other Brother 75       drug overdose   CAD Other 32   Colon cancer Neg Hx    Social History   Socioeconomic History   Marital status: Widowed    Spouse name: Not on file   Number of children: Not on file   Years of education: Not on file   Highest education level: 12th grade  Occupational History   Occupation: Retired Clinical biochemist  Tobacco Use   Smoking status: Never   Smokeless tobacco: Never  Vaping Use   Vaping status: Never Used  Substance and Sexual Activity   Alcohol use: No   Drug use: No   Sexual activity: Not on file  Other Topics Concern   Not on file  Social History Narrative   Not on file   Social Drivers of Health   Financial Resource Strain: Low Risk  (03/15/2024)   Overall Financial Resource Strain (CARDIA)    Difficulty of Paying Living Expenses: Not hard at all  Food Insecurity: No Food Insecurity (03/15/2024)   Hunger Vital Sign    Worried  About Running Out of Food in the Last Year: Never true    Ran Out of Food in the Last Year: Never true  Transportation Needs: No Transportation Needs (03/15/2024)   PRAPARE - Administrator, Civil Service (Medical): No    Lack of Transportation (Non-Medical): No  Physical Activity: Unknown (03/15/2024)   Exercise Vital Sign    Days of Exercise per Week: 2 days    Minutes of Exercise per Session: Patient declined  Stress: No Stress Concern Present (03/15/2024)   [employer] of Occupational Health - Occupational Stress Questionnaire    Feeling of Stress : Not at all  Social Connections: Moderately Integrated (03/15/2024)   Social Connection and Isolation Panel    Frequency of Communication with Friends and Family: More than three times a week    Frequency of Social Gatherings with Friends and Family: Three times a week    Attends Religious Services: More than 4 times per year    Active Member of Clubs or Organizations: Yes    Attends Banker Meetings: More than 4 times per year    Marital Status: Widowed  Intimate Partner Violence: Not At Risk (01/08/2024)   Humiliation, Afraid, Rape, and Kick questionnaire    Fear of Current or Ex-Partner: No    Emotionally Abused: No    Physically Abused: No    Sexually Abused: No    Review of Systems Constitutional: Patient denies any unintentional weight loss or change in strength lntegumentary: Patient denies any rashes or pruritus Eyes: Patient {Actions; denies-reports:120008} dry eyes ENT: Patient {Actions; denies-reports:120008} dry mouth Cardiovascular: Patient denies chest pain or syncope Respiratory: Patient denies shortness of breath Gastrointestinal: Patient {Actions; denies-reports:120008} constipation Musculoskeletal: Patient denies muscle cramps or weakness Neurologic: Patient denies convulsions or seizures Allergic/Immunologic: Patient denies recent allergic reaction(s) Hematologic/Lymphatic: Patient denies  bleeding tendencies Endocrine: Patient denies heat/cold intolerance  GU: As per HPI.  OBJECTIVE There were no vitals filed for this visit. There is no height or weight on file to calculate BMI.  Physical Examination Constitutional: No obvious distress; patient is non-toxic appearing  Cardiovascular: No visible lower extremity edema.  Respiratory: The patient does not have audible wheezing/stridor; respirations do not appear labored  Gastrointestinal: Abdomen non-distended  Musculoskeletal: Normal ROM of UEs  Skin: No obvious rashes/open sores  Neurologic: CN 2-12 grossly intact Psychiatric: Answered questions appropriately with normal affect  Hematologic/Lymphatic/Immunologic: No obvious bruises or sites of spontaneous bleeding  UA: ***negative ***positive for *** leukocytes, *** blood, ***nitrites Urine microscopy: *** WBC/hpf, *** RBC/hpf, *** bacteria ***glucosuria (secondary to ***Jardiance  ***Farxiga use) ***otherwise unremarkable  PVR: *** ml  ASSESSMENT No diagnosis found.  We discussed the symptoms of overactive bladder (OAB), which include urinary urgency, frequency, nocturia, with or without urge incontinence.  While we may not know the exact etiology of OAB, several risk factors can be identified.  - Patient's neurogenic risk factors: ***T2DM ***with neuropathy, ***nicotine use, ***spinal stenosis, ***prior stroke, ***dementia.  - Patient's exacerbating factors include: ***diuretic use, ***caffeine intake, ***glucosuria (due to ***Jardiance  / ***Farxiga use), ***ambulatory dysfunction (functional incontinence).   We discussed the following management options in detail including potential benefits, risks, and side effects: Behavioral therapy: Modify fluid intake Minimize / avoid bladder irritants (such as caffeine, spicy foods, acidic foods, alcohol) Bladder retraining / timed voiding Double voiding Medication(s): ***- We discussed potential side effects of  anticholinergic medications such as urinary retention, dry eyes, dry mouth, constipation, confusion, cognitive impairment / dementia.  ***- Not a safe candidate for anticholinergic medications due to risk for side effects based on patient's age, comorbidities, and pre-existing ***dry mouth ***dry eyes ***constipation ***dementia ***Parkinsons disease ***MS.  ***- Beta-3 agonist medications: We discussed potential side effects of beta-3 agonist medications such as urinary retention and (infrequently) elevated blood pressure.  ***- Combination therapy with anticholinergic medication + beta-3 agonist medication. 3. For refractory cases: PTNS (posterior tibial nerve stimulation) ***Not a safe candidate for PTNS due to ***bleeding disorder, ***anticoagulant use, ***pregnancy, ***pacemaker, ***implanted cardiac defibrillator (ICD), ***neuropathy / nerve damage / nerve conduction disorder, ***lower extremity metal implant(s).  Sacral neuromodulation trial (Medtronic lnterStim or Axonics implant) Bladder Botox  injections  ***Consider discussing possible alternatives to ***Jardiance  ***Farxiga with prescribing provider. This medication causes excess sugar to be excreted into the urine. That can prompt the kidneys to put out more water  to dilute that sugar in the urine and it can also irritate the bladder lining, both of which may contribute to OAB symptoms (urinary frequency, urgency, and urge incontinence).  She decided to proceed with *** ***behavioral modifications including ***minimizing / avoiding caffeine intake and working on ***timed voiding / bladder retraining.  Will plan for follow up in *** weeks / *** months or sooner if needed. Patient verbalized understanding and agreement. All questions were answered.  PLAN Advised the following: ***. *** ***. Minimize / avoid caffeine intake. ***. Work on timed voiding / bladder retraining. ***. No follow-ups on file.  No orders of the defined types  were placed in this encounter.   It has been explained that the patient is to follow regularly with their PCP in addition to all other providers involved in their care and to follow instructions provided by these respective offices. Patient advised to contact urology clinic if any urologic-pertaining questions, concerns, new symptoms or problems arise in the interim period.  There are no Patient Instructions on file for this visit.  Electronically signed by:  Lauraine JAYSON Oz, FNP   07/03/24    1:34 PM

## 2024-07-04 ENCOUNTER — Encounter: Payer: Self-pay | Admitting: Urology

## 2024-07-04 ENCOUNTER — Ambulatory Visit: Admitting: Urology

## 2024-07-04 VITALS — BP 129/82 | HR 72

## 2024-07-04 DIAGNOSIS — F1590 Other stimulant use, unspecified, uncomplicated: Secondary | ICD-10-CM

## 2024-07-04 DIAGNOSIS — N3281 Overactive bladder: Secondary | ICD-10-CM | POA: Diagnosis not present

## 2024-07-04 DIAGNOSIS — R3915 Urgency of urination: Secondary | ICD-10-CM

## 2024-07-04 DIAGNOSIS — R350 Frequency of micturition: Secondary | ICD-10-CM | POA: Diagnosis not present

## 2024-07-04 DIAGNOSIS — N3941 Urge incontinence: Secondary | ICD-10-CM | POA: Diagnosis not present

## 2024-07-04 DIAGNOSIS — Z789 Other specified health status: Secondary | ICD-10-CM

## 2024-07-04 LAB — URINALYSIS, ROUTINE W REFLEX MICROSCOPIC
Bilirubin, UA: NEGATIVE
Ketones, UA: NEGATIVE
Leukocytes,UA: NEGATIVE
Nitrite, UA: NEGATIVE
RBC, UA: NEGATIVE
Specific Gravity, UA: 1.02 (ref 1.005–1.030)
Urobilinogen, Ur: 0.2 mg/dL (ref 0.2–1.0)
pH, UA: 6 (ref 5.0–7.5)

## 2024-07-04 LAB — BLADDER SCAN AMB NON-IMAGING: Scan Result: 4

## 2024-07-04 NOTE — Patient Instructions (Signed)

## 2024-07-05 NOTE — Progress Notes (Signed)
 [facility]  Reason for visit: Post-surgical hypothyroidism; PTC  HPI:  [name]  is a 78 y.o. female with medical history of GERD, hypertension, hypokalemia, osteoporosis, nephrolithiasis who presents for a follow up visit for reason listed above.     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  Status post Thyrogen  stimulated radioactive iodine  ablation 109.0 mCi on 02/2011. History of radioactive iodine  treatment in 1978, presumably for hyperthyroidism.  Interval History: LV 12/2023.   Has a history of parotid nodule for which she is following with ENT.  Underwent FNA in June 2025-suspicion was that it was a simple right parotid cyst.Feels like cyst is coming back a little but not too bothersome - going to meet with ENT again in a few months.   Sleep is historically poor - no recent changes.  Appetite is good.  Had some symptomatic tachycardia in mid July that she has metoprolol  PRN. No issues since then.  She had some lightheadedness with that episode.  Since then she has been doing well.   She forgot to hold biotin for June labs but did hold them for most recent labs.    Review of Systems:  Complete ROS obtained and pertinent positives noted in the HPI   PMH, PSH, Social Hx, Family Hx, Meds and Allergies   I have reviewed PMH, SurgH, SH and FH.  There have been no significant changes since the last visit other than those documented in the HPI.  Medications: Meds Ordered in [facility]  Medication Sig Dispense Refill  . calcium  citrate 250 mg calcium  tab Take 1 tablet twice a day (with lunch and bedtime)    . cholecalciferol  (VITAMIN D3) 5,000 unit (125 mcg) tab tablet Take 1 tablet once a day    . cyanocobalamin  (VITAMIN B12) 500 mcg tablet Take 1 tablet once a day    . enalapril  (VASOTEC ) 10 mg tablet Take 10 mg by  mouth daily.    . ferrous sulfate  325 mg (65 mg iron) tablet Take 1 tablet in PM once a day (iron)     . multivitamin (THERAGRAN) tab tablet 2 (two) times a day.    . rosuvastatin  (CRESTOR ) 20 mg tablet Take 20 mg by mouth daily.    . Synthroid  150 mcg tablet Take 1 tablet (150 mcg total) by mouth every morning. Take 1 tablet on an empty stomach Monday - Saturday. 90 tablet 3  . valACYclovir  (VALTREX ) 1 gram tablet Take as directed as needed for fever blister      No current Epic-ordered facility-administered medications on file.    Allergies: Allergies  Allergen Reactions  . Codeine Other (See Comments) and GI Intolerance    unspecified  . Meperidine  GI Intolerance  . Quinamm Other (See Comments)    unspecified  . Quinine Other (See Comments)    unspecified  . Triamterene-Hydrochlorothiazid Other (See Comments)    unspecified     Objective Data   Vital Signs: Vitals:   07/12/24 0939 07/12/24 0941  BP: 145/73 122/87  BP Location: Left arm Right arm  Patient Position: Sitting Sitting  Pulse: 60   SpO2: 100%   Weight: 76.2 kg (167 lb 14.4 oz)       Wt Readings from Last 3 Encounters:  07/12/24 76.2 kg (167 lb 14.4 oz)  01/12/24 76.4 kg (168 lb 8 oz)  06/30/23 76 kg (167 lb 8 oz)  Physical Exam: General: Alert, well-nourished, and in no acute distress HEENT: Pupils equally round; no proptosis, lid lag, or stare  Thyroid : No visible goiter; palpated to be symmetric and no nodule noted  Cardiovascular: RRR, normal S1, S2, with no rubs or gallops. Loud holosystolic murmur present.  Respiratory: Normal respiratory effort, CTAB Extremities: well perfused, no edema noted, pulses intact b/l, no tremor with outstretched hands Skin: No rashes, wounds, nail or skin changes Neuro: Alert and oriented Psych: Pleasant mood   Pertinent Labs:   Pertinent Imaging:  -RAI for thyroid  cancer 02/2011 (care everywhere):  Radiopharmaceutical:  109.0 mCi I-131 sodium iodide.    IMPRESSION:   Per oral administration of I-131 sodium iodide for the treatment of  thyroid  cancer.    -Thyrogen  stimulated WBS 2016 (care everywhere):   FINDINGS:  There is normal physiologic tracer activity within the GI and GU  tract. There is also physiologic activity within the salivary  glands. No abnormal uptake identified within the thyroid  bed. There  is no evidence for distant metastatic disease.   IMPRESSION:  1. Examination is negative for residual or recurrent functioning  thyroid  tissue or metastatic disease.   Assessment and Plan  [name] is a 78 y.o. female with medical history of above who presents for a return visit for reason listed below.    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 Since patient is > 10 years from initial diagnosis without evidence of recurrent cancer and with patient's age will aim for TSH goal  0.5-1 range. Recommend decreasing Synthroid  dose given persistently low TSH in the setting of history of afib/symptomatic palpitations.  - TSH and Free T4 ordered for in six weeks - adjust Synthroid  to 137 mcg daily.  - Update thyroglobulin level and ab.    Return to clinic in 6 months or sooner if needed.   Miya McKnight DO  Electronically signed by:  Lear Farley Faster, DO 07/12/2024 10:19 AM

## 2024-07-10 ENCOUNTER — Other Ambulatory Visit: Payer: Self-pay

## 2024-07-10 DIAGNOSIS — Z85850 Personal history of malignant neoplasm of thyroid: Secondary | ICD-10-CM | POA: Diagnosis not present

## 2024-07-10 DIAGNOSIS — E890 Postprocedural hypothyroidism: Secondary | ICD-10-CM | POA: Diagnosis not present

## 2024-07-10 MED ORDER — APIXABAN 5 MG PO TABS: 5.0000 mg | ORAL_TABLET | Freq: Two times a day (BID) | ORAL | 1 refills | Status: DC

## 2024-07-10 NOTE — Telephone Encounter (Signed)
 Prescription refill request for Eliquis  received. Indication:afib Last office visit:6/25 Scr:0.79  2/25 Age: 78 Weight:78  kg  Prescription refilled

## 2024-07-12 DIAGNOSIS — Z85850 Personal history of malignant neoplasm of thyroid: Secondary | ICD-10-CM | POA: Diagnosis not present

## 2024-07-12 DIAGNOSIS — E890 Postprocedural hypothyroidism: Secondary | ICD-10-CM | POA: Diagnosis not present

## 2024-08-07 DIAGNOSIS — E785 Hyperlipidemia, unspecified: Secondary | ICD-10-CM | POA: Diagnosis not present

## 2024-08-08 ENCOUNTER — Ambulatory Visit: Payer: Self-pay | Admitting: *Deleted

## 2024-08-08 LAB — HEPATIC FUNCTION PANEL
ALT: 57 IU/L — ABNORMAL HIGH (ref 0–32)
AST: 37 IU/L (ref 0–40)
Albumin: 3.9 g/dL (ref 3.8–4.8)
Alkaline Phosphatase: 70 IU/L (ref 44–121)
Bilirubin Total: 0.7 mg/dL (ref 0.0–1.2)
Bilirubin, Direct: 0.25 mg/dL (ref 0.00–0.40)
Total Protein: 5.8 g/dL — ABNORMAL LOW (ref 6.0–8.5)

## 2024-08-08 LAB — LIPID PANEL
Chol/HDL Ratio: 2 ratio (ref 0.0–4.4)
Cholesterol, Total: 107 mg/dL (ref 100–199)
HDL: 54 mg/dL (ref >39)
LDL Chol Calc (NIH): 39 mg/dL (ref 0–99)
Triglycerides: 63 mg/dL (ref 0–149)
VLDL Cholesterol Cal: 14 mg/dL (ref 5–40)

## 2024-08-08 LAB — LIPOPROTEIN A (LPA): Lipoprotein (a): <8.4 nmol/L (ref <75.0)

## 2024-08-23 DIAGNOSIS — E890 Postprocedural hypothyroidism: Secondary | ICD-10-CM | POA: Diagnosis not present

## 2024-08-23 DIAGNOSIS — Z85850 Personal history of malignant neoplasm of thyroid: Secondary | ICD-10-CM | POA: Diagnosis not present

## 2024-08-28 ENCOUNTER — Other Ambulatory Visit: Payer: Self-pay | Admitting: Family Medicine

## 2024-08-28 DIAGNOSIS — Z1231 Encounter for screening mammogram for malignant neoplasm of breast: Secondary | ICD-10-CM

## 2024-09-17 ENCOUNTER — Ambulatory Visit
Admission: RE | Admit: 2024-09-17 | Discharge: 2024-09-17 | Disposition: A | Discharge status: Home / Self Care | Source: Ambulatory Visit | Attending: Family Medicine | Admitting: Family Medicine

## 2024-09-17 DIAGNOSIS — Z1231 Encounter for screening mammogram for malignant neoplasm of breast: Secondary | ICD-10-CM | POA: Diagnosis not present

## 2024-09-18 ENCOUNTER — Encounter: Payer: Self-pay | Admitting: Family Medicine

## 2024-09-18 ENCOUNTER — Ambulatory Visit (INDEPENDENT_AMBULATORY_CARE_PROVIDER_SITE_OTHER): Admitting: Family Medicine

## 2024-09-18 VITALS — BP 128/69 | HR 76 | Ht 66.5 in | Wt 164.0 lb

## 2024-09-18 DIAGNOSIS — E785 Hyperlipidemia, unspecified: Secondary | ICD-10-CM | POA: Diagnosis not present

## 2024-09-18 DIAGNOSIS — N182 Chronic kidney disease, stage 2 (mild): Secondary | ICD-10-CM | POA: Diagnosis not present

## 2024-09-18 DIAGNOSIS — Z23 Encounter for immunization: Secondary | ICD-10-CM

## 2024-09-18 DIAGNOSIS — H539 Unspecified visual disturbance: Secondary | ICD-10-CM | POA: Diagnosis not present

## 2024-09-18 DIAGNOSIS — I10 Essential (primary) hypertension: Secondary | ICD-10-CM | POA: Diagnosis not present

## 2024-09-18 NOTE — Patient Instructions (Signed)
 I will place referral.  Okay to stop jardiance .  Follow up in 6 months.

## 2024-09-19 DIAGNOSIS — H539 Unspecified visual disturbance: Secondary | ICD-10-CM | POA: Insufficient documentation

## 2024-09-19 DIAGNOSIS — N182 Chronic kidney disease, stage 2 (mild): Secondary | ICD-10-CM | POA: Insufficient documentation

## 2024-09-19 NOTE — Assessment & Plan Note (Signed)
Lipids at goal on Crestor.  Continue.

## 2024-09-19 NOTE — Assessment & Plan Note (Signed)
 Referring to neuro-ophthalmology at [facility].

## 2024-09-19 NOTE — Progress Notes (Signed)
 Subjective:  Patient ID: [name], female    DOB: [birth year]  Age: 78 y.o. MRN: [id number]  CC:   Chief Complaint  Patient presents with   Care Management    Six month follow up     HPI:  78 year old female with the below mentioned medical problems presents for follow-up.  Overall, patient is doing fairly well.  She does note that she is having issues with her vision.  She states that she has had visual field disturbance and also had times where she has felt like her eyes were misaligned or crossing.  She states that when this occurs that she has to close 1 eye to be able to see.  She has seen her eye doctor.  It has been recommended that she see a neuro-ophthalmologist.  However, this has been difficult due to the fact that her insurance does not cover neuro-ophthalmology at [facility].  Will look into this.  Patient started on Jardiance  by cardiology for CKD 2.  Her GFR is 77.  Creatinine normal.  This is likely age-related decline as opposed to true chronic kidney disease.  Will obtain urine microalbumin today.  BP well-controlled.  Follows closely with endocrinology regarding hypothyroidism given history of thyroid  cancer.  Lipids at goal on Crestor .  Patient Active Problem List   Diagnosis Date Noted   Visual disturbance 09/19/2024   CKD (chronic kidney disease) stage 2, GFR 60-89 ml/min 09/19/2024   CVA (cerebral vascular accident) (HCC) 01/05/2024   Parotid mass 01/05/2024   OAB (overactive bladder) 01/04/2024   S/P mitral valve clip implantation 01/03/2024   Status post total replacement of left hip 11/21/2022   GERD (gastroesophageal reflux disease) 05/11/2022   Urge incontinence 02/21/2022   B12 deficiency 11/10/2021   Vitamin D  deficiency 11/10/2021   Status post total hip replacement, right 12/22/2020   History of gastric bypass 11/02/2020   History of thyroid  cancer 10/22/2020   Hypothyroidism, 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   Essential hypertension, benign  09/09/2013   Hyperlipidemia 09/09/2013    Social Hx   Social History   Socioeconomic History   Marital status: Widowed    Spouse name: Not on file   Number of children: Not on file   Years of education: Not on file   Highest education level: Some college, no degree  Occupational History   Occupation: Retired Clinical biochemist  Tobacco Use   Smoking status: Never   Smokeless tobacco: Never  Vaping Use   Vaping status: Never Used  Substance and Sexual Activity   Alcohol use: No   Drug use: No   Sexual activity: Not on file  Other Topics Concern   Not on file  Social History Narrative   Not on file   Social Drivers of Health   Financial Resource Strain: Low Risk  (09/14/2024)   Overall Financial Resource Strain (CARDIA)    Difficulty of Paying Living Expenses: Not hard at all  Food Insecurity: No Food Insecurity (09/14/2024)   Hunger Vital Sign    Worried About Running Out of Food in the Last Year: Never true    Ran Out of Food in the Last Year: Never true  Transportation Needs: No Transportation Needs (09/14/2024)   PRAPARE - Administrator, Civil Service (Medical): No    Lack of Transportation (Non-Medical): No  Physical Activity: Inactive (09/14/2024)   Exercise Vital Sign    Days of Exercise per Week: 0 days    Minutes of Exercise per Session: Not on  file  Stress: No Stress Concern Present (09/14/2024)   [employer] of Occupational Health - Occupational Stress Questionnaire    Feeling of Stress: Not at all  Social Connections: Moderately Integrated (09/14/2024)   Social Connection and Isolation Panel    Frequency of Communication with Friends and Family: More than three times a week    Frequency of Social Gatherings with Friends and Family: More than three times a week    Attends Religious Services: More than 4 times per year    Active Member of [employer] or Organizations: Yes    Attends Banker Meetings: More than 4 times per year     Marital Status: Widowed    Review of Systems Per HPI  Objective:  BP 128/69   Pulse 76   Ht 5' 6.5 (1.689 m)   Wt 164 lb (74.4 kg)   SpO2 96%   BMI 26.07 kg/m      09/18/2024   10:15 AM 07/04/2024   11:26 AM 06/06/2024   10:41 AM  BP/Weight  Systolic BP 128 129 154  Diastolic BP 69 82 82  Wt. (Lbs) 164  172  BMI 26.07 kg/m2  26.94 kg/m2    Physical Exam Vitals and nursing note reviewed.  Constitutional:      General: She is not in acute distress.    Appearance: Normal appearance.  HENT:     Head: Normocephalic and atraumatic.  Eyes:     General:        Right eye: No discharge.        Left eye: No discharge.     Conjunctiva/sclera: Conjunctivae normal.  Cardiovascular:     Rate and Rhythm: Normal rate and regular rhythm.  Pulmonary:     Effort: Pulmonary effort is normal.     Breath sounds: Normal breath sounds. No wheezing, rhonchi or rales.  Neurological:     Mental Status: She is alert.  Psychiatric:        Mood and Affect: Mood normal.        Behavior: Behavior normal.     Lab Results  Component Value Date   WBC 6.4 02/07/2024   HGB 12.6 02/07/2024   HCT 38.1 02/07/2024   PLT 179 02/07/2024   GLUCOSE 83 02/07/2024   CHOL 107 08/07/2024   TRIG 63 08/07/2024   HDL 54 08/07/2024   LDLCALC 39 08/07/2024   ALT 57 (H) 08/07/2024   AST 37 08/07/2024   NA 143 02/07/2024   K 4.4 02/07/2024   CL 109 (H) 02/07/2024   CREATININE 0.79 02/07/2024   BUN 14 02/07/2024   CO2 21 02/07/2024   TSH 2.860 02/07/2023   INR 1.0 12/26/2023   HGBA1C 4.7 (L) 01/04/2024     Assessment & Plan:  Visual disturbance Assessment & Plan: Referring to neuro-ophthalmology at [facility].  Orders: -     Ambulatory referral to Ophthalmology  Encounter for immunization -     Flu vaccine HIGH DOSE PF(Fluzone Trivalent)  Essential hypertension, benign Assessment & Plan: Stable. Continue Enalapril  and Metoprolol .  Orders: -     Microalbumin / creatinine urine  ratio  Hyperlipidemia, unspecified hyperlipidemia type Assessment & Plan: Lipids at goal on Crestor .  Continue.   CKD (chronic kidney disease) stage 2, GFR 60-89 ml/min Assessment & Plan: Obtaining urine microalbumin.  I do not feel that her renal function is a true underlying pathology.  I feel like this is normal given her advanced age.  I advised her that at this  point in time unless her urine microalbumin is significantly abnormal, I do not feel that the benefits outweigh the risks regarding Jardiance .  Okay to discontinue.     Follow-up:  6 months  Alanni Vader Bluford DO [facility]

## 2024-09-19 NOTE — Assessment & Plan Note (Signed)
 Stable. Continue Enalapril  and Metoprolol .

## 2024-09-19 NOTE — Assessment & Plan Note (Signed)
 Obtaining urine microalbumin.  I do not feel that her renal function is a true underlying pathology.  I feel like this is normal given her advanced age.  I advised her that at this point in time unless her urine microalbumin is significantly abnormal, I do not feel that the benefits outweigh the risks regarding Jardiance .  Okay to discontinue.

## 2024-09-20 LAB — MICROALBUMIN / CREATININE URINE RATIO
Creatinine, Urine: 61.9 mg/dL
Microalb/Creat Ratio: 29 mg/g{creat} (ref 0–29)
Microalbumin, Urine: 18.1 ug/mL

## 2024-09-26 ENCOUNTER — Ambulatory Visit (INDEPENDENT_AMBULATORY_CARE_PROVIDER_SITE_OTHER): Admitting: Otolaryngology

## 2024-09-26 ENCOUNTER — Encounter (INDEPENDENT_AMBULATORY_CARE_PROVIDER_SITE_OTHER): Payer: Self-pay | Admitting: Otolaryngology

## 2024-09-26 VITALS — BP 133/73 | HR 62 | Ht 66.5 in | Wt 164.0 lb

## 2024-09-26 DIAGNOSIS — K118 Other diseases of salivary glands: Secondary | ICD-10-CM | POA: Diagnosis not present

## 2024-09-26 NOTE — Progress Notes (Signed)
 Dear Dr. Bluford, Here is my assessment for our mutual patient, [name]. Thank you for allowing me the opportunity to care for your patient. Please do not hesitate to contact me should you have any other questions. Sincerely, Dr. Eldora Blanch  Otolaryngology Clinic Note Referring provider: Dr. Bluford HPI:  [name] is a 78 y.o. female kindly referred by Dr. Bluford for evaluation of parotid mass  Initial visit (03/2024): Patient reports: incidentally noted during workup for her stroke, she had not noted it prior but after workup she has noted it behind her ear. Feels a bit bigger than it was. Not painful, no numbness. No known facial or neck skin cancers. No prior US  or biopsy. Patient otherwise denies: - dysphagia, unintentional weight loss - changes in voice, shortness of breath, hemoptysis - ear pain, other neck masses  --------------------------------------------------------- 05/23/2024 Returns for follow up. She reports that she has overall done well. No pain. Felt like the cyst had gone down after bx, but now back to same size. No facial weakness. We discussed her path. --------------------------------------------------------- 09/26/2024 Not having issues overall. No pain, cyst about same size. No facial weakness or numbness. No neck masses    H&N Surgery: Thyroidectomy (2012) for follicular variant PTC s/p ablation Personal or FHx of bleeding dz or anesthesia difficulty: no  GLP-1: no AP/AC: Eliquis   Tobacco: never. Alcohol: no  PMHx: MV insufficiency s/p repair, HLD, Thyroid  Ca s/p thyroidectomy, HTN, CVA, PAF on Eliquis   Independent Review of Additional Tests or Records:  Dr. Bluford (03/19/2024): noted incidentally noted parotid mass; ref to ENT CTA 01/04/2024 independently interpreted: right parotid nodule noted over tail of parotid; no obvious worrisome neck adenopathy noted but study is suboptimal for evalulation  CBC and BMP 02/07/2024: BUN/Cr 14/0.79, WBC 6.4, Plt  179 US  interpreted independently 04/24/2024 - right solid/cystic parotid mass, 1.5x1 cm Path (05/17/2024):  PMH/Meds/All/SocHx/FamHx/ROS:   Past Medical History:  Diagnosis Date   Arthritis    Cancer (HCC)    Thyroid  cancer   COVID-19 virus infection 12/2020   Family history of adverse reaction to anesthesia    mother couldn't wake up good and would throw up   GERD (gastroesophageal reflux disease)    Heart murmur    History of kidney stones    Hypercholesteremia    resolved after gastric bypass   Hypertension    Hypothyroidism    Pneumonia 11/22/2023   LLL   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 Wendel   Severe mitral insufficiency    Tachycardia      Past Surgical History:  Procedure Laterality Date   ABDOMINAL HYSTERECTOMY     BALLOON DILATION N/A 07/17/2023   Procedure: BALLOON DILATION;  Surgeon: Cindie Carlin POUR, DO;  Location: [facility];  Service: Endoscopy;  Laterality: N/A;   BILATERAL SALPINGOOPHORECTOMY     BREAST EXCISIONAL BIOPSY Bilateral 1995   benign   CHOLECYSTECTOMY     COLONOSCOPY  11/14/2011   Procedure: COLONOSCOPY;  Surgeon: Lamar CHRISTELLA Hollingshead, MD;  Location: [facility];  Service: Endoscopy;  Laterality: N/A;   ESOPHAGOGASTRODUODENOSCOPY (EGD) WITH PROPOFOL  N/A 07/17/2023   Procedure: ESOPHAGOGASTRODUODENOSCOPY (EGD) WITH L;  Surgeon: Cindie Carlin POUR, DO;  Location: [facility];  Service: Endoscopy;  Laterality: N/A;   REPLACEMENT TOTAL KNEE BILATERAL     RIGHT/LEFT HEART CATH AND CORONARY ANGIOGRAPHY N/A 11/16/2023   Procedure: RIGHT/LEFT HEART CATH AND CORONARY ANGIOGRAPHY;  Surgeon: Thukkani, Arun K, MD;  Location: [facility] INVASIVE CV LAB;  Service: Cardiovascular;  Laterality: N/A;   ROUX-EN-Y PROCEDURE  2007   TEE WITHOUT CARDIOVERSION N/A 11/11/2022   Procedure: TRANSESOPHAGEAL ECHOCARDIOGRAM (TEE);  Surgeon: Mallipeddi, Vishnu P, MD;  Location: [facility] ORS;  Service: Cardiovascular;  Laterality: N/A;    TONSILLECTOMY     TOTAL HIP ARTHROPLASTY Right 12/23/2019   Procedure: RIGHT TOTAL HIP ARTHROPLASTY ANTERIOR APPROACH;  Surgeon: Jerri Kay HERO, MD;  Location: [facility] OR;  Service: Orthopedics;  Laterality: Right;   TOTAL HIP ARTHROPLASTY Left 11/21/2022   Procedure: LEFT TOTAL HIP ARTHROPLASTY ANTERIOR APPROACH;  Surgeon: Jerri Kay HERO, MD;  Location: [facility] OR;  Service: Orthopedics;  Laterality: Left;  3-C   TOTAL THYROIDECTOMY  12/2010   TRANSCATHETER MITRAL EDGE TO EDGE REPAIR N/A 01/03/2024   Procedure: TRANSCATHETER MITRAL EDGE TO EDGE REPAIR;  Surgeon: Wendel Lurena POUR, MD;  Location: [facility]AS[facility] CV LAB;  Service: Cardiovascular;  Laterality: N/A;   TRANSESOPHAGEAL ECHOCARDIOGRAM (CATH LAB) N/A 10/27/2023   Procedure: TRANSESOPHAGEAL ECHOCARDIOGRAM;  Surgeon: Delford Maude BROCKS, MD;  Location[facility] INVASIVE CV LAB;  Service: Cardiovascular;  Laterality: N/A;   TRANSESOPHAGEAL ECHOCARDIOGRAM (CATH LAB) N/A 01/03/2024   Procedure: TRANSESOPHAGEAL ECHOCARDIOGRAM;  Surgeon: Wendel Lurena POUR, MD;  Location: [facility] INVASIVE CV LAB;  Service: Cardiovascular;  Laterality: N/A;   TUBAL LIGATION      Family History  Problem Relation Age of Onset   Breast cancer Mother    Pneumonia Mother 36   CAD Father 78   Breast cancer Sister    CAD Sister 62   Other Maternal Grandmother 54   CAD Brother 76   Other Brother 11       drug overdose   CAD Other 32   Colon cancer Neg Hx      Social Connections: Moderately Integrated (09/14/2024)   Social Connection and Isolation Panel    Frequency of Communication with Friends and Family: More than three times a week    Frequency of Social Gatherings with Friends and Family: More than three times a week    Attends Religious Services: More than 4 times per year    Active Member of [employer] or Organizations: Yes    Attends Banker Meetings: More than 4 times per year    Marital Status: Widowed      Current Outpatient Medications:    apixaban  (ELIQUIS ) 5  MG TABS tablet, Take 1 tablet (5 mg total) by mouth 2 (two) times daily., Disp: 180 tablet, Rfl: 1   Biotin 5000 MCG CAPS, Take 5,000 mcg by mouth daily., Disp: , Rfl:    Calcium  Citrate-Vitamin D  (CALCIUM  CITRATE+D3 PO), Take 1 tablet by mouth daily., Disp: , Rfl:    carboxymethylcellulose (REFRESH PLUS) 0.5 % SOLN, Place 1 drop into both eyes 3 (three) times daily as needed (Dry eye)., Disp: , Rfl:    Cholecalciferol  (VITAMIN D3 ULTRA STRENGTH) 125 MCG (5000 UT) capsule, Take 5,000 Units by mouth daily., Disp: , Rfl:    Cyanocobalamin  5000 MCG TBDP, Take 5,000 mcg by mouth daily., Disp: , Rfl:    empagliflozin  (JARDIANCE ) 10 MG TABS tablet, Take 1 tablet (10 mg total) by mouth daily before breakfast., Disp: 30 tablet, Rfl: 5   empagliflozin  (JARDIANCE ) 10 MG TABS tablet, Take 1 tablet (10 mg total) by mouth daily before breakfast., Disp: 28 tablet, Rfl: 0   enalapril  (VASOTEC ) 10 MG tablet, Take 1 tablet (10 mg total) by mouth daily., Disp: 90 tablet, Rfl: 3   ferrous sulfate   325 (65 FE) MG tablet, Take 325 mg by mouth daily., Disp: , Rfl:    levothyroxine  (SYNTHROID ) 150 MCG tablet, Take 1 tablet (150 mcg total) by mouth daily before breakfast. (Patient taking differently: Take 125 mcg by mouth daily before breakfast.), Disp: 30 tablet, Rfl: 0   Magnesium  250 MG CAPS, Take 250 mg by mouth daily., Disp: , Rfl:    metoprolol  tartrate (LOPRESSOR ) 25 MG tablet, Take 1 tablet (25 mg total) by mouth 2 (two) times daily as needed., Disp: , Rfl:    Multiple Vitamin (MULTIVITAMIN WITH MINERALS) TABS tablet, Take 1 tablet by mouth in the morning and at bedtime. Woman 50+, Disp: , Rfl:    rosuvastatin  (CRESTOR ) 40 MG tablet, Take 1 tablet (40 mg total) by mouth daily., Disp: 90 tablet, Rfl: 3   valACYclovir  (VALTREX ) 1000 MG tablet, TAKE 2 TABLETS AT FIRST SIGN OF FEVER BLISTER, THEN 2 TABLETS 2 HOURSLATER, Disp: 4 tablet, Rfl: 5   Zinc 50 MG TABS, Take 50 mg by mouth daily., Disp: , Rfl:    amoxicillin   (AMOXIL ) 500 MG tablet, TAKE 4 TABLETS BY MOUTH 1 HOUR PRIOR TO DENTAL PROCEDURES (Patient not taking: Reported on 09/26/2024), Disp: 12 tablet, Rfl: 12   pantoprazole  (PROTONIX ) 40 MG tablet, Take 1 tablet (40 mg total) by mouth daily. (Patient not taking: Reported on 09/26/2024), Disp: 60 tablet, Rfl: 1  Current Facility-Administered Medications:    botulinum toxin Type A  (BOTOX ) injection 200 Units, 200 Units, Intramuscular, Once,    ciprofloxacin  (CIPRO ) tablet 500 mg, 500 mg, Oral, Once,    lidocaine  (XYLOCAINE ) 2 % (with pres) injection 200 mg, 10 mL, Other, Once,    Physical Exam:   BP 133/73 (BP Location: Left Arm, Patient Position: Sitting, Cuff Size: Large)   Pulse 62   Ht 5' 6.5 (1.689 m)   Wt 164 lb (74.4 kg)   SpO2 97%   BMI 26.07 kg/m   Salient findings:  CN II-XII intact - CN 7 function intact in all branches b/l, facial sensation intact b/l V1-3 to light touch Bilateral EAC clear and TM intact with well pneumatized middle ear spaces No lesions of oral cavity/oropharynx; dentition fair No obviously palpable neck masses/lymphadenopathy/thyromegaly EXCEPT right small well demarcated, palpable, tail of parotid mass. No adenopathy noted in neck which is palpable; thyroidectomy scar well healed No respiratory distress or stridor No skin lesions noted over face or neck per my exam concerning for carcinoma  Seprately Identifiable Procedures:   None  Impression & Plans:  Salvador Bigbee is a 78 y.o. female with:  1. Parotid nodule    Right tail of parotid nodule noted incidentally. She is otherwise asymptomatic. We discussed path; discussed options and etiologies again - most likely benign but did discuss parotidectomy v/s repeat FNA for diagnosis.  Discussed R/B/A and she opted for repeat FNA which is reasonable and will able to see size as well  F/u 3 months with FNA   See below regarding exact medications prescribed this encounter including dosages and route: No  orders of the defined types were placed in this encounter.     Thank you for allowing me the opportunity to care for your patient. Please do not hesitate to contact me should you have any other questions.  Sincerely, Eldora Blanch, MD Otolaryngologist (ENT), [facility] Phone: [phone] Fax: [phone]  09/26/2024, 12:26 PM   MDM:  [phone] Complexity/Problems addressed: low Data complexity: low - Morbidity: low currently  - Prescription Drug prescribed or  managed: no

## 2024-09-26 NOTE — Patient Instructions (Signed)
 I have ordered an imaging study for you to complete prior to your next visit. Please call [facility] Scheduling at [phone] to schedule your imaging if you have not received a call within 24 hours. If you are unable to complete your imaging study prior to your next scheduled visit please call our office to let us  know.

## 2024-09-27 ENCOUNTER — Encounter (HOSPITAL_COMMUNITY): Payer: Self-pay

## 2024-09-27 NOTE — Progress Notes (Signed)
 Luverne Aran, MD  Cree [place] OK for repeat FNA of right parotid cyst under local if US  shows that it has come back.  GY       Previous Messages    ----- Message ----- From: Prestyn Mahn Sent: 09/26/2024   1:03 PM EDT To: Quinlee Sciarra; Ir Procedure Requests Subject: US  FNA SOFT TISSUE                            Procedure: US  FNA SOFT TISSUE -- repeat requested  Reason : right parotid mass, request repeat FNA Dx: Parotid nodule [K11.8 (ICD-10-CM)]    History :MM 3D SCREENING MAMMOGRAM BILATERAL BREAST, US  FNA SOFT TISSUE, US  soft tissue head and neck , CT angio head neck w/wo  Provider : Tobie Eldora NOVAK, MD  Contact : [phone]

## 2024-10-14 ENCOUNTER — Encounter: Payer: Self-pay | Admitting: Radiology

## 2024-10-18 DIAGNOSIS — E890 Postprocedural hypothyroidism: Secondary | ICD-10-CM | POA: Diagnosis not present

## 2024-10-21 ENCOUNTER — Encounter: Payer: Self-pay | Admitting: Family Medicine

## 2024-10-30 ENCOUNTER — Other Ambulatory Visit: Admitting: Urology

## 2024-11-20 ENCOUNTER — Other Ambulatory Visit (INDEPENDENT_AMBULATORY_CARE_PROVIDER_SITE_OTHER)

## 2024-11-20 ENCOUNTER — Ambulatory Visit: Payer: Medicare HMO | Admitting: Orthopaedic Surgery

## 2024-11-20 DIAGNOSIS — Z96642 Presence of left artificial hip joint: Secondary | ICD-10-CM | POA: Diagnosis not present

## 2024-11-20 NOTE — Progress Notes (Signed)
 Post-Op Visit Note   Patient: [name]           Date of Birth: 1946/07/06           MRN: [id number] Visit Date: 11/20/2024 PCP: Cook, Jayce G, DO   Assessment & Plan:  Chief Complaint:  Chief Complaint  Patient presents with   Left Hip - Follow-up    Left total hip arthroplasty 11/21/2022   Visit Diagnoses:  1. Status post total replacement of left hip     Plan: History of Present Illness [name] is a 78 year old female who presents for follow-up of her left total hip replacement.  It has been two years since her left hip replacement and almost five years since her right hip replacement. She has no pain or functional limitations from either hip.  She had two heart surgeries this year for severe mitral regurgitation, which has been treated.  Assessment and Plan Status post left total hip arthroplasty Two years post left total hip arthroplasty with no complications. Implants are well-integrated. - No activity restrictions. - follow up as needed  Follow-Up Instructions: No follow-ups on file.   Orders:  Orders Placed This Encounter  Procedures   XR HIP UNILAT W OR W/O PELVIS 2-3 VIEWS LEFT   No orders of the defined types were placed in this encounter.   Imaging: XR HIP UNILAT W OR W/O PELVIS 2-3 VIEWS LEFT Result Date: 11/20/2024 Stable left total hip replacement without complication   PMFS History: Patient Active Problem List   Diagnosis Date Noted   Visual disturbance 09/19/2024   CKD (chronic kidney disease) stage 2, GFR 60-89 ml/min 09/19/2024   CVA (cerebral vascular accident) (HCC) 01/05/2024   Parotid mass 01/05/2024   OAB (overactive bladder) 01/04/2024   S/P mitral valve clip implantation 01/03/2024   Status post total replacement of left hip 11/21/2022   GERD (gastroesophageal reflux disease) 05/11/2022   Urge incontinence 02/21/2022   B12 deficiency 11/10/2021   Vitamin D  deficiency 11/10/2021   Status post total hip  replacement, right 12/22/2020   History of gastric bypass 11/02/2020   History of thyroid  cancer 10/22/2020   Hypothyroidism, 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   Essential hypertension, benign 09/09/2013   Hyperlipidemia 09/09/2013   Past Medical History:  Diagnosis Date   Arthritis    Cancer (HCC)    Thyroid  cancer   COVID-19 virus infection 12/2020   Family history of adverse reaction to anesthesia    mother couldn't wake up good and would throw up   GERD (gastroesophageal reflux disease)    Heart murmur    History of kidney stones    Hypercholesteremia    resolved after gastric bypass   Hypertension    Hypothyroidism    Pneumonia 11/22/2023   LLL   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 Wendel   Severe mitral insufficiency    Tachycardia     Family History  Problem Relation Age of Onset   Breast cancer Mother    Pneumonia Mother 42   CAD Father 43   Breast cancer Sister    CAD Sister 39   Other Maternal Grandmother 71   CAD Brother 73   Other Brother 46       drug overdose   CAD Other 32   Colon cancer Neg Hx     Past Surgical History:  Procedure Laterality Date   ABDOMINAL HYSTERECTOMY     BALLOON DILATION  N/A 07/17/2023   Procedure: BALLOON DILATION;  Surgeon: Cindie Carlin POUR, DO;  [facility]: [facility];  Service: Endoscopy;  Laterality: N/A;   BILATERAL SALPINGOOPHORECTOMY     BREAST EXCISIONAL BIOPSY Bilateral 1995   benign   CHOLECYSTECTOMY     COLONOSCOPY  11/14/2011   Procedure: COLONOSCOPY;  Surgeon: Lamar CHRISTELLA Hollingshead, MD;  Location: [facility];  Service: Endoscopy;  Laterality: N/A;   ESOPHAGOGASTRODUODENOSCOPY (EGD) WITH PROPOFOL  N/A 07/17/2023   Procedure: ESOPHAGOGASTRODUODENOSCOPY (EGD) WITH L;  Surgeon: Cindie Carlin POUR, DO;  [facility]: [facility];  Service: Endoscopy;  Laterality: N/A;   REPLACEMENT TOTAL KNEE BILATERAL     RIGHT/LEFT HEART CATH AND CORONARY ANGIOGRAPHY N/A 11/16/2023   Procedure:  RIGHT/LEFT HEART CATH AND CORONARY ANGIOGRAPHY;  Surgeon: Wendel Lurena POUR, MD;  Location: [facility];  Service: Cardiovascular;  Laterality: N/A;   ROUX-EN-Y PROCEDURE  2007   TEE WITHOUT CARDIOVERSION N/A 11/11/2022   Procedure: TRANSESOPHAGEAL ECHOCARDIOGRAM (TEE);  Surgeon: Mallipeddi, Vishnu P, MD;  Location: [facility];  Service: Cardiovascular;  Laterality: N/A;   TONSILLECTOMY     TOTAL HIP ARTHROPLASTY Right 12/23/2019   Procedure: RIGHT TOTAL HIP ARTHROPLASTY ANTERIOR APPROACH;  Surgeon: Jerri Kay CHRISTELLA, MD;  Location: [facility];  Service: Orthopedics;  Laterality: Right;   TOTAL HIP ARTHROPLASTY Left 11/21/2022   Procedure: LEFT TOTAL HIP ARTHROPLASTY ANTERIOR APPROACH;  Surgeon: Jerri Kay CHRISTELLA, MD;  Location: [facility];  Service: Orthopedics;  Laterality: Left;  3-C   TOTAL THYROIDECTOMY  12/2010   TRANSCATHETER MITRAL EDGE TO EDGE REPAIR N/A 01/03/2024   Procedure: TRANSCATHETER MITRAL EDGE TO EDGE REPAIR;  Surgeon: Wendel Lurena POUR, MD;  Location: [facility];  Service: Cardiovascular;  Laterality: N/A;   TRANSESOPHAGEAL ECHOCARDIOGRAM (CATH LAB) N/A 10/27/2023   Procedure: TRANSESOPHAGEAL ECHOCARDIOGRAM;  Surgeon: Delford Maude BROCKS, MD;  Location: [facility] INVASIVE CV LAB;  Service: Cardiovascular;  Laterality: N/A;   TRANSESOPHAGEAL ECHOCARDIOGRAM (CATH LAB) N/A 01/03/2024   Procedure: TRANSESOPHAGEAL ECHOCARDIOGRAM;  Surgeon: Wendel Lurena POUR, MD;  Location: [facility] INVASIVE CV LAB;  Service: Cardiovascular;  Laterality: N/A;   TUBAL LIGATION     Social History   Occupational History   Occupation: Retired clinical biochemist  Tobacco Use   Smoking status: Never   Smokeless tobacco: Never  Vaping Use   Vaping status: Never Used  Substance and Sexual Activity   Alcohol use: No   Drug use: No   Sexual activity: Not on file

## 2024-11-29 NOTE — Progress Notes (Unsigned)
 " [facility]   MULTIDISCIPLINARY HEART VALVE CLINIC                                     Cardiology Office Note:    Date:  11/29/2024   ID:  REGANA KEMPLE, DOB 25-Aug-1946, MRN [id number]  PCP:  Bluford Jacqulyn MATSU, DO  [facility] Cardiologist:  Lurena MARLA Red, MD  [facility] Structural heart: None [facility] Electrophysiologist:  None   Referring MD: Bluford Jacqulyn MATSU, DO   No chief complaint on file. ***  History of Present Illness:    [name] is a 78 y.o. female with a hx of ***  Past Medical History:  Diagnosis Date   Arthritis    Cancer (HCC)    Thyroid  cancer   COVID-19 virus infection 12/2020   Family history of adverse reaction to anesthesia    mother couldn't wake up good and would throw up   GERD (gastroesophageal reflux disease)    Heart murmur    History of kidney stones    Hypercholesteremia    resolved after gastric bypass   Hypertension    Hypothyroidism    Pneumonia 11/22/2023   LLL   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 Red   Severe mitral insufficiency    Tachycardia      Current Medications: Active Medications[1]    ROS:   Please see the history of present illness.    All other systems reviewed and are negative.  EKGs       Risk Assessment/Calculations:   {Does this patient have ATRIAL FIBRILLATION?[phone]}        Physical Exam:    VS:  There were no vitals taken for this visit.    Wt Readings from Last 3 Encounters:  09/26/24 164 lb (74.4 kg)  09/18/24 164 lb (74.4 kg)  06/06/24 172 lb (78 kg)     GEN: Well nourished, well developed in no acute distress NECK: No JVD CARDIAC: ***RRR, no murmurs, rubs, gallops RESPIRATORY:  Clear to auscultation without rales, wheezing or rhonchi  ABDOMEN: Soft, non-tender, non-distended EXTREMITIES:  No edema; No deformity.  Groin sites clear without hematoma or ecchymosis. ****  ASSESSMENT:    No  diagnosis found.  PLAN:    In order of problems listed above:  Severe AS s/p TAVR:  -- Pt doing *** s/p TAVR.  -- ECG with no HAVB.  -- Groin sites healing well.  -- SBE***.  -- Continue Aspirin  81mg  daily.*** -- Cleared to resume all activities without restriction. -- I will see back for 1 month echo and OV.  Severe AS s/p TAVR:  -- Echo today shows EF ***, normally functioning TAVR with a mean gradient of *** mm hg and *** PVL.  -- NYHA class *** symptoms.  -- Continue ***.  -- SBE*** -- I will see back for 1 month/year office visit with echo.    Medication Adjustments/Labs and Tests Ordered: Current medicines are reviewed at length with the patient today.  Concerns regarding medicines are outlined above.  No orders of the defined types were placed in this encounter.  No orders of the defined types were placed in this encounter.   There are no Patient Instructions on file for this visit.   Signed, Lamarr Hummer, PA-C  11/29/2024 10:01 AM    [facility]    [1]  No outpatient medications have been marked as taking for the 12/02/24 encounter (Appointment) with Sebastian Lamarr SAUNDERS, PA-C.   Current Facility-Administered Medications for the 12/02/24 encounter (Appointment) with Sebastian Lamarr SAUNDERS, PA-C  Medication   botulinum toxin Type A  (BOTOX ) injection 200 Units   ciprofloxacin  (CIPRO ) tablet 500 mg   lidocaine  (XYLOCAINE ) 2 % (with pres) injection 200 mg   "

## 2024-12-02 ENCOUNTER — Ambulatory Visit: Admitting: Physician Assistant

## 2024-12-02 ENCOUNTER — Ambulatory Visit
Admission: RE | Admit: 2024-12-02 | Discharge: 2024-12-02 | Disposition: A | Discharge status: Home / Self Care | Payer: Self-pay | Source: Ambulatory Visit | Attending: Physician Assistant | Admitting: Physician Assistant

## 2024-12-02 VITALS — BP 138/76 | HR 59 | Ht 67.0 in | Wt 169.0 lb

## 2024-12-02 DIAGNOSIS — Z95818 Presence of other cardiac implants and grafts: Secondary | ICD-10-CM | POA: Insufficient documentation

## 2024-12-02 DIAGNOSIS — I10 Essential (primary) hypertension: Secondary | ICD-10-CM | POA: Diagnosis not present

## 2024-12-02 DIAGNOSIS — I6310 Cerebral infarction due to embolism of unspecified precerebral artery: Secondary | ICD-10-CM

## 2024-12-02 DIAGNOSIS — K118 Other diseases of salivary glands: Secondary | ICD-10-CM

## 2024-12-02 DIAGNOSIS — I480 Paroxysmal atrial fibrillation: Secondary | ICD-10-CM | POA: Diagnosis not present

## 2024-12-02 DIAGNOSIS — Z9889 Other specified postprocedural states: Secondary | ICD-10-CM

## 2024-12-02 DIAGNOSIS — Z954 Presence of other heart-valve replacement: Secondary | ICD-10-CM | POA: Diagnosis not present

## 2024-12-02 LAB — ECHOCARDIOGRAM COMPLETE
Area-P 1/2: 2.69 cm2
MV M vel: 5.04 m/s
MV Peak grad: 101.6 mmHg
MV VTI: 2.62 cm2
S' Lateral: 3.1 cm

## 2024-12-02 NOTE — Patient Instructions (Signed)
 Medication Instructions:  Your physician recommends that you continue on your current medications as directed. Please refer to the Current Medication list given to you today.  *If you need a refill on your cardiac medications before your next appointment, please call your pharmacy*  Lab Work: None needed If you have labs (blood work) drawn today and your tests are completely normal, you will receive your results only by: MyChart Message (if you have MyChart) OR A paper copy in the mail If you have any lab test that is abnormal or we need to change your treatment, we will call you to review the results.  Testing/Procedures: 12/01/2025 Your physician has requested that you have an echocardiogram. Echocardiography is a painless test that uses sound waves to create images of your heart. It provides your doctor with information about the size and shape of your heart and how well your hearts chambers and valves are working. This procedure takes approximately one hour. There are no restrictions for this procedure. Please do NOT wear cologne, perfume, aftershave, or lotions (deodorant is allowed). Please arrive 15 minutes prior to your appointment time.  Please note: We ask at that you not bring children with you during ultrasound (echo/ vascular) testing. Due to room size and safety concerns, children are not allowed in the ultrasound rooms during exams. Our front office staff cannot provide observation of children in our lobby area while testing is being conducted. An adult accompanying a patient to their appointment will only be allowed in the ultrasound room at the discretion of the ultrasound technician under special circumstances. We apologize for any inconvenience.   Follow-Up: At [facility], you and your health needs are our priority.  As part of our continuing mission to provide you with exceptional heart care, our providers are all part of one team.  This team includes your primary  Cardiologist (physician) and Advanced Practice Providers or APPs (Physician Assistants and Nurse Practitioners) who all work together to provide you with the care you need, when you need it.  Your next appointment:   12 month(s)  Provider:   Lurena Red, MD    We recommend signing up for the patient portal called MyChart.  Sign up information is provided on this After Visit Summary.  MyChart is used to connect with patients for Virtual Visits (Telemedicine).  Patients are able to view lab/test results, encounter notes, upcoming appointments, etc.  Non-urgent messages can be sent to your provider as well.   To learn more about what you can do with MyChart, go to [email or web address].

## 2024-12-03 ENCOUNTER — Ambulatory Visit: Payer: Self-pay | Admitting: Physician Assistant

## 2024-12-15 ENCOUNTER — Other Ambulatory Visit: Payer: Self-pay | Admitting: Internal Medicine

## 2024-12-23 ENCOUNTER — Ambulatory Visit: Payer: Medicare HMO | Admitting: Physician Assistant

## 2024-12-23 ENCOUNTER — Other Ambulatory Visit (HOSPITAL_COMMUNITY): Payer: Medicare HMO

## 2024-12-24 ENCOUNTER — Other Ambulatory Visit (HOSPITAL_BASED_OUTPATIENT_CLINIC_OR_DEPARTMENT_OTHER): Payer: Self-pay

## 2024-12-26 ENCOUNTER — Ambulatory Visit (INDEPENDENT_AMBULATORY_CARE_PROVIDER_SITE_OTHER): Admitting: Otolaryngology

## 2025-01-14 ENCOUNTER — Other Ambulatory Visit: Payer: Self-pay | Admitting: Internal Medicine

## 2025-03-19 ENCOUNTER — Ambulatory Visit: Admitting: Family Medicine

## 2025-12-01 ENCOUNTER — Ambulatory Visit (HOSPITAL_COMMUNITY)
